# Patient Record
Sex: Female | Born: 1950
Health system: Southern US, Community
[De-identification: ages and names within clinical notes are randomized; demographics above are authoritative.]

## PROBLEM LIST (undated history)

## (undated) DIAGNOSIS — F1910 Other psychoactive substance abuse, uncomplicated: Secondary | ICD-10-CM

## (undated) DIAGNOSIS — D490 Neoplasm of unspecified behavior of digestive system: Secondary | ICD-10-CM

## (undated) DIAGNOSIS — J189 Pneumonia, unspecified organism: Secondary | ICD-10-CM

## (undated) DIAGNOSIS — Z765 Malingerer [conscious simulation]: Secondary | ICD-10-CM

## (undated) DIAGNOSIS — G8929 Other chronic pain: Secondary | ICD-10-CM

## (undated) DIAGNOSIS — D151 Benign neoplasm of heart: Secondary | ICD-10-CM

## (undated) DIAGNOSIS — I2510 Atherosclerotic heart disease of native coronary artery without angina pectoris: Secondary | ICD-10-CM

## (undated) DIAGNOSIS — M549 Dorsalgia, unspecified: Secondary | ICD-10-CM

## (undated) DIAGNOSIS — M1990 Unspecified osteoarthritis, unspecified site: Secondary | ICD-10-CM

## (undated) DIAGNOSIS — K3189 Other diseases of stomach and duodenum: Secondary | ICD-10-CM

## (undated) DIAGNOSIS — G43909 Migraine, unspecified, not intractable, without status migrainosus: Secondary | ICD-10-CM

## (undated) DIAGNOSIS — F419 Anxiety disorder, unspecified: Secondary | ICD-10-CM

## (undated) DIAGNOSIS — D649 Anemia, unspecified: Secondary | ICD-10-CM

## (undated) DIAGNOSIS — M109 Gout, unspecified: Secondary | ICD-10-CM

## (undated) DIAGNOSIS — K219 Gastro-esophageal reflux disease without esophagitis: Secondary | ICD-10-CM

## (undated) DIAGNOSIS — K589 Irritable bowel syndrome without diarrhea: Secondary | ICD-10-CM

## (undated) DIAGNOSIS — M25569 Pain in unspecified knee: Secondary | ICD-10-CM

## (undated) DIAGNOSIS — Z8719 Personal history of other diseases of the digestive system: Secondary | ICD-10-CM

## (undated) DIAGNOSIS — G629 Polyneuropathy, unspecified: Secondary | ICD-10-CM

## (undated) DIAGNOSIS — Z9889 Other specified postprocedural states: Secondary | ICD-10-CM

## (undated) HISTORY — DX: Other specified postprocedural states: Z98.890

## (undated) HISTORY — DX: Atherosclerotic heart disease of native coronary artery without angina pectoris: I25.10

## (undated) HISTORY — DX: Irritable bowel syndrome, unspecified: K58.9

## (undated) HISTORY — PX: CHOLECYSTECTOMY: SHX55

## (undated) HISTORY — PX: OTHER SURGICAL HISTORY: SHX169

## (undated) HISTORY — PX: FOOT SURGERY: SHX648

## (undated) HISTORY — PX: TONSILLECTOMY: SUR1361

## (undated) HISTORY — DX: Other chronic pain: G89.29

## (undated) HISTORY — DX: Other diseases of stomach and duodenum: K31.89

## (undated) HISTORY — PX: DILATION AND CURETTAGE OF UTERUS: SHX78

## (undated) HISTORY — DX: Anxiety disorder, unspecified: F41.9

## (undated) HISTORY — DX: Neoplasm of unspecified behavior of digestive system: D49.0

## (undated) HISTORY — PX: BACK SURGERY: SHX140

## (undated) HISTORY — DX: Personal history of other diseases of the digestive system: Z87.19

## (undated) HISTORY — DX: Gastro-esophageal reflux disease without esophagitis: K21.9

## (undated) HISTORY — PX: ABDOMINAL HYSTERECTOMY: SHX81

## (undated) HISTORY — DX: Migraine, unspecified, not intractable, without status migrainosus: G43.909

## (undated) HISTORY — PX: ABDOMINAL SURGERY: SHX537

## (undated) HISTORY — DX: Dorsalgia, unspecified: M54.9

## (undated) HISTORY — DX: Benign neoplasm of heart: D15.1

---

## 1988-12-22 HISTORY — PX: PARTIAL GASTRECTOMY: SHX2172

## 1990-12-22 DIAGNOSIS — D490 Neoplasm of unspecified behavior of digestive system: Secondary | ICD-10-CM

## 1990-12-22 HISTORY — DX: Neoplasm of unspecified behavior of digestive system: D49.0

## 1998-04-10 ENCOUNTER — Ambulatory Visit (HOSPITAL_COMMUNITY): Admission: RE | Admit: 1998-04-10 | Discharge: 1998-04-10 | Payer: Self-pay | Admitting: *Deleted

## 1998-05-04 ENCOUNTER — Inpatient Hospital Stay (HOSPITAL_COMMUNITY): Admission: AD | Admit: 1998-05-04 | Discharge: 1998-05-08 | Payer: Self-pay | Admitting: Internal Medicine

## 1998-05-22 ENCOUNTER — Encounter: Admission: RE | Admit: 1998-05-22 | Discharge: 1998-05-22 | Payer: Self-pay | Admitting: Internal Medicine

## 1998-06-13 ENCOUNTER — Ambulatory Visit (HOSPITAL_COMMUNITY): Admission: RE | Admit: 1998-06-13 | Discharge: 1998-06-13 | Payer: Self-pay | Admitting: *Deleted

## 1998-06-20 ENCOUNTER — Encounter: Admission: RE | Admit: 1998-06-20 | Discharge: 1998-06-20 | Payer: Self-pay | Admitting: Internal Medicine

## 1998-07-31 ENCOUNTER — Ambulatory Visit (HOSPITAL_COMMUNITY): Admission: RE | Admit: 1998-07-31 | Discharge: 1998-07-31 | Payer: Self-pay | Admitting: *Deleted

## 1998-08-07 ENCOUNTER — Ambulatory Visit (HOSPITAL_COMMUNITY): Admission: RE | Admit: 1998-08-07 | Discharge: 1998-08-07 | Payer: Self-pay | Admitting: *Deleted

## 1998-10-03 ENCOUNTER — Encounter: Payer: Self-pay | Admitting: *Deleted

## 1998-10-03 ENCOUNTER — Ambulatory Visit (HOSPITAL_COMMUNITY): Admission: RE | Admit: 1998-10-03 | Discharge: 1998-10-03 | Payer: Self-pay | Admitting: *Deleted

## 1999-12-23 HISTORY — PX: COLONOSCOPY: SHX174

## 2000-03-30 ENCOUNTER — Encounter: Admission: RE | Admit: 2000-03-30 | Discharge: 2000-03-30 | Payer: Self-pay | Admitting: Internal Medicine

## 2000-04-08 ENCOUNTER — Emergency Department (HOSPITAL_COMMUNITY): Admission: EM | Admit: 2000-04-08 | Discharge: 2000-04-08 | Payer: Self-pay | Admitting: Emergency Medicine

## 2000-04-08 ENCOUNTER — Encounter: Payer: Self-pay | Admitting: Emergency Medicine

## 2000-04-20 ENCOUNTER — Encounter: Admission: RE | Admit: 2000-04-20 | Discharge: 2000-04-20 | Payer: Self-pay | Admitting: Internal Medicine

## 2000-06-01 ENCOUNTER — Encounter: Admission: RE | Admit: 2000-06-01 | Discharge: 2000-06-01 | Payer: Self-pay | Admitting: Internal Medicine

## 2000-06-26 ENCOUNTER — Encounter: Admission: RE | Admit: 2000-06-26 | Discharge: 2000-06-26 | Payer: Self-pay | Admitting: Internal Medicine

## 2000-07-15 ENCOUNTER — Encounter: Admission: RE | Admit: 2000-07-15 | Discharge: 2000-07-15 | Payer: Self-pay | Admitting: Internal Medicine

## 2000-09-09 ENCOUNTER — Encounter: Admission: RE | Admit: 2000-09-09 | Discharge: 2000-09-09 | Payer: Self-pay | Admitting: Internal Medicine

## 2000-10-07 ENCOUNTER — Encounter: Payer: Self-pay | Admitting: Emergency Medicine

## 2000-10-07 ENCOUNTER — Emergency Department (HOSPITAL_COMMUNITY): Admission: EM | Admit: 2000-10-07 | Discharge: 2000-10-07 | Payer: Self-pay | Admitting: Emergency Medicine

## 2000-10-09 ENCOUNTER — Encounter: Admission: RE | Admit: 2000-10-09 | Discharge: 2000-10-09 | Payer: Self-pay | Admitting: Internal Medicine

## 2000-10-15 ENCOUNTER — Encounter: Admission: RE | Admit: 2000-10-15 | Discharge: 2000-10-15 | Payer: Self-pay

## 2000-11-11 ENCOUNTER — Encounter: Admission: RE | Admit: 2000-11-11 | Discharge: 2000-11-11 | Payer: Self-pay | Admitting: Internal Medicine

## 2000-12-04 ENCOUNTER — Encounter: Admission: RE | Admit: 2000-12-04 | Discharge: 2000-12-04 | Payer: Self-pay | Admitting: Internal Medicine

## 2001-01-05 ENCOUNTER — Encounter: Admission: RE | Admit: 2001-01-05 | Discharge: 2001-01-05 | Payer: Self-pay | Admitting: Internal Medicine

## 2001-01-20 ENCOUNTER — Encounter: Admission: RE | Admit: 2001-01-20 | Discharge: 2001-01-20 | Payer: Self-pay | Admitting: Internal Medicine

## 2001-04-01 ENCOUNTER — Emergency Department (HOSPITAL_COMMUNITY): Admission: EM | Admit: 2001-04-01 | Discharge: 2001-04-01 | Payer: Self-pay | Admitting: *Deleted

## 2001-04-28 ENCOUNTER — Encounter: Admission: RE | Admit: 2001-04-28 | Discharge: 2001-04-28 | Payer: Self-pay | Admitting: Internal Medicine

## 2001-06-13 ENCOUNTER — Emergency Department (HOSPITAL_COMMUNITY): Admission: EM | Admit: 2001-06-13 | Discharge: 2001-06-14 | Payer: Self-pay | Admitting: Emergency Medicine

## 2001-07-09 ENCOUNTER — Encounter: Admission: RE | Admit: 2001-07-09 | Discharge: 2001-07-09 | Payer: Self-pay | Admitting: Internal Medicine

## 2001-08-06 ENCOUNTER — Encounter: Admission: RE | Admit: 2001-08-06 | Discharge: 2001-08-06 | Payer: Self-pay | Admitting: Internal Medicine

## 2001-08-31 ENCOUNTER — Encounter: Payer: Self-pay | Admitting: Emergency Medicine

## 2001-08-31 ENCOUNTER — Emergency Department (HOSPITAL_COMMUNITY): Admission: EM | Admit: 2001-08-31 | Discharge: 2001-08-31 | Payer: Self-pay | Admitting: Emergency Medicine

## 2001-10-27 ENCOUNTER — Encounter: Admission: RE | Admit: 2001-10-27 | Discharge: 2001-10-27 | Payer: Self-pay | Admitting: Internal Medicine

## 2001-11-13 ENCOUNTER — Emergency Department (HOSPITAL_COMMUNITY): Admission: EM | Admit: 2001-11-13 | Discharge: 2001-11-13 | Payer: Self-pay | Admitting: Emergency Medicine

## 2001-12-03 ENCOUNTER — Encounter: Admission: RE | Admit: 2001-12-03 | Discharge: 2001-12-03 | Payer: Self-pay | Admitting: Internal Medicine

## 2001-12-20 ENCOUNTER — Ambulatory Visit (HOSPITAL_COMMUNITY): Admission: RE | Admit: 2001-12-20 | Discharge: 2001-12-20 | Payer: Self-pay

## 2001-12-20 ENCOUNTER — Encounter: Admission: RE | Admit: 2001-12-20 | Discharge: 2001-12-20 | Payer: Self-pay

## 2001-12-20 ENCOUNTER — Encounter: Payer: Self-pay | Admitting: Internal Medicine

## 2001-12-21 ENCOUNTER — Emergency Department (HOSPITAL_COMMUNITY): Admission: EM | Admit: 2001-12-21 | Discharge: 2001-12-21 | Payer: Self-pay | Admitting: *Deleted

## 2002-01-06 ENCOUNTER — Encounter: Admission: RE | Admit: 2002-01-06 | Discharge: 2002-01-06 | Payer: Self-pay

## 2002-03-05 ENCOUNTER — Emergency Department (HOSPITAL_COMMUNITY): Admission: EM | Admit: 2002-03-05 | Discharge: 2002-03-05 | Payer: Self-pay | Admitting: Emergency Medicine

## 2002-03-05 ENCOUNTER — Encounter: Payer: Self-pay | Admitting: Emergency Medicine

## 2002-03-08 ENCOUNTER — Encounter: Admission: RE | Admit: 2002-03-08 | Discharge: 2002-03-08 | Payer: Self-pay | Admitting: Internal Medicine

## 2002-03-13 ENCOUNTER — Emergency Department (HOSPITAL_COMMUNITY): Admission: EM | Admit: 2002-03-13 | Discharge: 2002-03-13 | Payer: Self-pay | Admitting: Internal Medicine

## 2002-03-30 ENCOUNTER — Encounter: Admission: RE | Admit: 2002-03-30 | Discharge: 2002-03-30 | Payer: Self-pay

## 2002-05-13 ENCOUNTER — Emergency Department (HOSPITAL_COMMUNITY): Admission: EM | Admit: 2002-05-13 | Discharge: 2002-05-13 | Payer: Self-pay | Admitting: *Deleted

## 2002-06-13 ENCOUNTER — Emergency Department (HOSPITAL_COMMUNITY): Admission: EM | Admit: 2002-06-13 | Discharge: 2002-06-14 | Payer: Self-pay | Admitting: *Deleted

## 2002-06-14 ENCOUNTER — Encounter: Admission: RE | Admit: 2002-06-14 | Discharge: 2002-06-14 | Payer: Self-pay | Admitting: Internal Medicine

## 2002-06-25 ENCOUNTER — Emergency Department (HOSPITAL_COMMUNITY): Admission: EM | Admit: 2002-06-25 | Discharge: 2002-06-25 | Payer: Self-pay | Admitting: Emergency Medicine

## 2002-06-29 ENCOUNTER — Encounter: Admission: RE | Admit: 2002-06-29 | Discharge: 2002-06-29 | Payer: Self-pay | Admitting: Internal Medicine

## 2002-08-09 ENCOUNTER — Emergency Department (HOSPITAL_COMMUNITY): Admission: EM | Admit: 2002-08-09 | Discharge: 2002-08-10 | Payer: Self-pay | Admitting: *Deleted

## 2002-08-10 ENCOUNTER — Encounter: Payer: Self-pay | Admitting: *Deleted

## 2002-08-17 ENCOUNTER — Encounter: Admission: RE | Admit: 2002-08-17 | Discharge: 2002-08-17 | Payer: Self-pay | Admitting: Internal Medicine

## 2002-08-25 ENCOUNTER — Encounter: Payer: Self-pay | Admitting: Emergency Medicine

## 2002-08-25 ENCOUNTER — Emergency Department (HOSPITAL_COMMUNITY): Admission: EM | Admit: 2002-08-25 | Discharge: 2002-08-25 | Payer: Self-pay | Admitting: Emergency Medicine

## 2002-09-13 ENCOUNTER — Emergency Department (HOSPITAL_COMMUNITY): Admission: EM | Admit: 2002-09-13 | Discharge: 2002-09-13 | Payer: Self-pay | Admitting: *Deleted

## 2002-09-14 ENCOUNTER — Encounter: Admission: RE | Admit: 2002-09-14 | Discharge: 2002-09-14 | Payer: Self-pay | Admitting: Internal Medicine

## 2002-09-29 ENCOUNTER — Encounter: Admission: RE | Admit: 2002-09-29 | Discharge: 2002-09-29 | Payer: Self-pay | Admitting: Obstetrics and Gynecology

## 2002-10-05 ENCOUNTER — Encounter: Admission: RE | Admit: 2002-10-05 | Discharge: 2002-10-05 | Payer: Self-pay | Admitting: Internal Medicine

## 2002-10-11 ENCOUNTER — Encounter: Admission: RE | Admit: 2002-10-11 | Discharge: 2002-10-11 | Payer: Self-pay | Admitting: Internal Medicine

## 2002-10-17 ENCOUNTER — Emergency Department (HOSPITAL_COMMUNITY): Admission: EM | Admit: 2002-10-17 | Discharge: 2002-10-17 | Payer: Self-pay | Admitting: Emergency Medicine

## 2002-10-31 ENCOUNTER — Encounter: Admission: RE | Admit: 2002-10-31 | Discharge: 2002-10-31 | Payer: Self-pay | Admitting: Internal Medicine

## 2002-11-07 ENCOUNTER — Emergency Department (HOSPITAL_COMMUNITY): Admission: EM | Admit: 2002-11-07 | Discharge: 2002-11-07 | Payer: Self-pay | Admitting: Emergency Medicine

## 2002-11-24 ENCOUNTER — Encounter: Admission: RE | Admit: 2002-11-24 | Discharge: 2002-11-24 | Payer: Self-pay | Admitting: Internal Medicine

## 2002-12-08 ENCOUNTER — Emergency Department (HOSPITAL_COMMUNITY): Admission: EM | Admit: 2002-12-08 | Discharge: 2002-12-08 | Payer: Self-pay | Admitting: Internal Medicine

## 2002-12-08 ENCOUNTER — Encounter: Admission: RE | Admit: 2002-12-08 | Discharge: 2002-12-08 | Payer: Self-pay | Admitting: Internal Medicine

## 2003-01-14 ENCOUNTER — Emergency Department (HOSPITAL_COMMUNITY): Admission: EM | Admit: 2003-01-14 | Discharge: 2003-01-14 | Payer: Self-pay | Admitting: *Deleted

## 2003-01-14 ENCOUNTER — Encounter: Payer: Self-pay | Admitting: *Deleted

## 2003-01-19 ENCOUNTER — Ambulatory Visit (HOSPITAL_COMMUNITY): Admission: RE | Admit: 2003-01-19 | Discharge: 2003-01-19 | Payer: Self-pay | Admitting: Internal Medicine

## 2003-01-19 ENCOUNTER — Encounter: Admission: RE | Admit: 2003-01-19 | Discharge: 2003-01-19 | Payer: Self-pay | Admitting: Internal Medicine

## 2003-01-19 ENCOUNTER — Encounter: Payer: Self-pay | Admitting: Internal Medicine

## 2003-03-09 ENCOUNTER — Encounter: Admission: RE | Admit: 2003-03-09 | Discharge: 2003-03-09 | Payer: Self-pay | Admitting: Internal Medicine

## 2003-03-15 ENCOUNTER — Emergency Department (HOSPITAL_COMMUNITY): Admission: EM | Admit: 2003-03-15 | Discharge: 2003-03-15 | Payer: Self-pay | Admitting: *Deleted

## 2003-05-05 ENCOUNTER — Encounter: Payer: Self-pay | Admitting: *Deleted

## 2003-05-05 ENCOUNTER — Emergency Department (HOSPITAL_COMMUNITY): Admission: EM | Admit: 2003-05-05 | Discharge: 2003-05-05 | Payer: Self-pay | Admitting: *Deleted

## 2003-07-17 ENCOUNTER — Emergency Department (HOSPITAL_COMMUNITY): Admission: EM | Admit: 2003-07-17 | Discharge: 2003-07-17 | Payer: Self-pay | Admitting: *Deleted

## 2003-08-06 ENCOUNTER — Emergency Department (HOSPITAL_COMMUNITY): Admission: EM | Admit: 2003-08-06 | Discharge: 2003-08-06 | Payer: Self-pay | Admitting: *Deleted

## 2003-08-06 ENCOUNTER — Encounter: Payer: Self-pay | Admitting: *Deleted

## 2003-08-17 ENCOUNTER — Emergency Department (HOSPITAL_COMMUNITY): Admission: EM | Admit: 2003-08-17 | Discharge: 2003-08-18 | Payer: Self-pay | Admitting: *Deleted

## 2003-08-22 ENCOUNTER — Ambulatory Visit (HOSPITAL_COMMUNITY): Admission: RE | Admit: 2003-08-22 | Discharge: 2003-08-22 | Payer: Self-pay | Admitting: Oncology

## 2003-08-22 ENCOUNTER — Encounter: Payer: Self-pay | Admitting: General Surgery

## 2003-08-22 ENCOUNTER — Encounter (HOSPITAL_COMMUNITY): Payer: Self-pay | Admitting: Oncology

## 2003-08-23 ENCOUNTER — Ambulatory Visit (HOSPITAL_COMMUNITY): Admission: RE | Admit: 2003-08-23 | Discharge: 2003-08-23 | Payer: Self-pay | Admitting: General Surgery

## 2003-08-24 ENCOUNTER — Encounter: Payer: Self-pay | Admitting: *Deleted

## 2003-08-24 ENCOUNTER — Emergency Department (HOSPITAL_COMMUNITY): Admission: EM | Admit: 2003-08-24 | Discharge: 2003-08-25 | Payer: Self-pay | Admitting: *Deleted

## 2004-01-29 ENCOUNTER — Emergency Department (HOSPITAL_COMMUNITY): Admission: EM | Admit: 2004-01-29 | Discharge: 2004-01-29 | Payer: Self-pay | Admitting: Emergency Medicine

## 2004-02-08 ENCOUNTER — Ambulatory Visit (HOSPITAL_COMMUNITY): Admission: RE | Admit: 2004-02-08 | Discharge: 2004-02-08 | Payer: Self-pay | Admitting: General Surgery

## 2004-05-03 ENCOUNTER — Emergency Department (HOSPITAL_COMMUNITY): Admission: EM | Admit: 2004-05-03 | Discharge: 2004-05-03 | Payer: Self-pay | Admitting: Emergency Medicine

## 2004-06-17 ENCOUNTER — Emergency Department (HOSPITAL_COMMUNITY): Admission: EM | Admit: 2004-06-17 | Discharge: 2004-06-17 | Payer: Self-pay | Admitting: Emergency Medicine

## 2004-08-03 ENCOUNTER — Emergency Department (HOSPITAL_COMMUNITY): Admission: EM | Admit: 2004-08-03 | Discharge: 2004-08-03 | Payer: Self-pay | Admitting: Emergency Medicine

## 2004-08-20 ENCOUNTER — Emergency Department (HOSPITAL_COMMUNITY): Admission: EM | Admit: 2004-08-20 | Discharge: 2004-08-21 | Payer: Self-pay | Admitting: Emergency Medicine

## 2004-09-20 ENCOUNTER — Emergency Department (HOSPITAL_COMMUNITY): Admission: EM | Admit: 2004-09-20 | Discharge: 2004-09-20 | Payer: Self-pay | Admitting: Emergency Medicine

## 2004-09-22 ENCOUNTER — Emergency Department (HOSPITAL_COMMUNITY): Admission: EM | Admit: 2004-09-22 | Discharge: 2004-09-22 | Payer: Self-pay | Admitting: Emergency Medicine

## 2004-10-17 ENCOUNTER — Emergency Department (HOSPITAL_COMMUNITY): Admission: EM | Admit: 2004-10-17 | Discharge: 2004-10-17 | Payer: Self-pay | Admitting: Emergency Medicine

## 2004-12-10 ENCOUNTER — Emergency Department (HOSPITAL_COMMUNITY): Admission: EM | Admit: 2004-12-10 | Discharge: 2004-12-11 | Payer: Self-pay | Admitting: *Deleted

## 2006-07-07 ENCOUNTER — Emergency Department (HOSPITAL_COMMUNITY): Admission: EM | Admit: 2006-07-07 | Discharge: 2006-07-07 | Payer: Self-pay | Admitting: Emergency Medicine

## 2007-07-22 ENCOUNTER — Emergency Department (HOSPITAL_COMMUNITY): Admission: EM | Admit: 2007-07-22 | Discharge: 2007-07-22 | Payer: Self-pay | Admitting: Emergency Medicine

## 2007-12-18 ENCOUNTER — Emergency Department (HOSPITAL_COMMUNITY): Admission: EM | Admit: 2007-12-18 | Discharge: 2007-12-19 | Payer: Self-pay | Admitting: Emergency Medicine

## 2007-12-23 DIAGNOSIS — K3189 Other diseases of stomach and duodenum: Secondary | ICD-10-CM

## 2007-12-23 HISTORY — DX: Other diseases of stomach and duodenum: K31.89

## 2008-08-30 ENCOUNTER — Emergency Department (HOSPITAL_COMMUNITY): Admission: EM | Admit: 2008-08-30 | Discharge: 2008-08-30 | Payer: Self-pay | Admitting: Emergency Medicine

## 2008-08-30 ENCOUNTER — Encounter: Payer: Self-pay | Admitting: Orthopedic Surgery

## 2008-09-25 ENCOUNTER — Emergency Department (HOSPITAL_COMMUNITY): Admission: EM | Admit: 2008-09-25 | Discharge: 2008-09-25 | Payer: Self-pay | Admitting: Emergency Medicine

## 2008-10-16 ENCOUNTER — Telehealth: Payer: Self-pay | Admitting: Orthopedic Surgery

## 2008-10-16 ENCOUNTER — Ambulatory Visit: Payer: Self-pay | Admitting: Orthopedic Surgery

## 2008-10-16 DIAGNOSIS — M25569 Pain in unspecified knee: Secondary | ICD-10-CM

## 2008-10-16 DIAGNOSIS — M23302 Other meniscus derangements, unspecified lateral meniscus, unspecified knee: Secondary | ICD-10-CM | POA: Insufficient documentation

## 2008-10-17 ENCOUNTER — Telehealth: Payer: Self-pay | Admitting: Orthopedic Surgery

## 2008-10-19 ENCOUNTER — Ambulatory Visit (HOSPITAL_COMMUNITY): Admission: RE | Admit: 2008-10-19 | Discharge: 2008-10-19 | Payer: Self-pay | Admitting: Orthopedic Surgery

## 2008-10-25 ENCOUNTER — Ambulatory Visit: Payer: Self-pay | Admitting: Orthopedic Surgery

## 2008-10-25 DIAGNOSIS — M1710 Unilateral primary osteoarthritis, unspecified knee: Secondary | ICD-10-CM | POA: Insufficient documentation

## 2008-11-06 ENCOUNTER — Emergency Department (HOSPITAL_COMMUNITY): Admission: EM | Admit: 2008-11-06 | Discharge: 2008-11-06 | Payer: Self-pay | Admitting: Emergency Medicine

## 2008-11-09 ENCOUNTER — Ambulatory Visit: Payer: Self-pay | Admitting: Orthopedic Surgery

## 2008-11-09 DIAGNOSIS — M19049 Primary osteoarthritis, unspecified hand: Secondary | ICD-10-CM | POA: Insufficient documentation

## 2008-11-21 HISTORY — PX: ESOPHAGOGASTRODUODENOSCOPY: SHX1529

## 2008-11-29 ENCOUNTER — Telehealth: Payer: Self-pay | Admitting: Orthopedic Surgery

## 2008-12-05 ENCOUNTER — Emergency Department (HOSPITAL_COMMUNITY): Admission: EM | Admit: 2008-12-05 | Discharge: 2008-12-05 | Payer: Self-pay | Admitting: Emergency Medicine

## 2008-12-08 ENCOUNTER — Inpatient Hospital Stay (HOSPITAL_COMMUNITY): Admission: EM | Admit: 2008-12-08 | Discharge: 2008-12-11 | Payer: Self-pay | Admitting: Emergency Medicine

## 2008-12-11 ENCOUNTER — Ambulatory Visit: Payer: Self-pay | Admitting: Gastroenterology

## 2008-12-11 ENCOUNTER — Encounter (INDEPENDENT_AMBULATORY_CARE_PROVIDER_SITE_OTHER): Payer: Self-pay | Admitting: Internal Medicine

## 2008-12-22 HISTORY — PX: GALLBLADDER SURGERY: SHX652

## 2008-12-27 ENCOUNTER — Ambulatory Visit: Payer: Self-pay | Admitting: Orthopedic Surgery

## 2008-12-29 ENCOUNTER — Encounter: Payer: Self-pay | Admitting: Gastroenterology

## 2008-12-29 LAB — CONVERTED CEMR LAB
Albumin: 4.4 g/dL (ref 3.5–5.2)
Alkaline Phosphatase: 71 units/L (ref 39–117)
BUN: 12 mg/dL (ref 6–23)
Calcium: 9.6 mg/dL (ref 8.4–10.5)
Chloride: 104 meq/L (ref 96–112)
Glucose, Bld: 95 mg/dL (ref 70–99)
Potassium: 3.7 meq/L (ref 3.5–5.3)
Sodium: 140 meq/L (ref 135–145)
Total Protein: 6.7 g/dL (ref 6.0–8.3)

## 2009-01-15 ENCOUNTER — Ambulatory Visit: Payer: Self-pay | Admitting: Gastroenterology

## 2009-01-29 ENCOUNTER — Encounter (HOSPITAL_COMMUNITY): Admission: RE | Admit: 2009-01-29 | Discharge: 2009-02-28 | Payer: Self-pay | Admitting: Gastroenterology

## 2009-01-31 ENCOUNTER — Emergency Department (HOSPITAL_COMMUNITY): Admission: EM | Admit: 2009-01-31 | Discharge: 2009-01-31 | Payer: Self-pay | Admitting: Emergency Medicine

## 2009-02-14 ENCOUNTER — Observation Stay (HOSPITAL_COMMUNITY): Admission: RE | Admit: 2009-02-14 | Discharge: 2009-02-15 | Payer: Self-pay | Admitting: General Surgery

## 2009-02-14 ENCOUNTER — Encounter (INDEPENDENT_AMBULATORY_CARE_PROVIDER_SITE_OTHER): Payer: Self-pay | Admitting: General Surgery

## 2009-02-19 ENCOUNTER — Encounter: Payer: Self-pay | Admitting: Gastroenterology

## 2009-03-14 ENCOUNTER — Emergency Department (HOSPITAL_COMMUNITY): Admission: EM | Admit: 2009-03-14 | Discharge: 2009-03-14 | Payer: Self-pay | Admitting: Emergency Medicine

## 2009-04-01 ENCOUNTER — Emergency Department (HOSPITAL_COMMUNITY): Admission: EM | Admit: 2009-04-01 | Discharge: 2009-04-01 | Payer: Self-pay | Admitting: Emergency Medicine

## 2009-04-10 ENCOUNTER — Encounter: Payer: Self-pay | Admitting: Gastroenterology

## 2009-05-26 ENCOUNTER — Emergency Department (HOSPITAL_COMMUNITY): Admission: EM | Admit: 2009-05-26 | Discharge: 2009-05-26 | Payer: Self-pay | Admitting: Emergency Medicine

## 2009-07-12 DIAGNOSIS — K2990 Gastroduodenitis, unspecified, without bleeding: Secondary | ICD-10-CM

## 2009-07-12 DIAGNOSIS — R1011 Right upper quadrant pain: Secondary | ICD-10-CM

## 2009-07-12 DIAGNOSIS — R1013 Epigastric pain: Secondary | ICD-10-CM

## 2009-07-12 DIAGNOSIS — K2970 Gastritis, unspecified, without bleeding: Secondary | ICD-10-CM | POA: Insufficient documentation

## 2009-07-12 DIAGNOSIS — K589 Irritable bowel syndrome without diarrhea: Secondary | ICD-10-CM | POA: Insufficient documentation

## 2009-07-12 DIAGNOSIS — Z8719 Personal history of other diseases of the digestive system: Secondary | ICD-10-CM | POA: Insufficient documentation

## 2009-07-17 ENCOUNTER — Ambulatory Visit: Payer: Self-pay | Admitting: Gastroenterology

## 2009-07-17 DIAGNOSIS — R1310 Dysphagia, unspecified: Secondary | ICD-10-CM | POA: Insufficient documentation

## 2009-07-17 DIAGNOSIS — R1084 Generalized abdominal pain: Secondary | ICD-10-CM

## 2009-07-20 ENCOUNTER — Ambulatory Visit (HOSPITAL_COMMUNITY): Admission: RE | Admit: 2009-07-20 | Discharge: 2009-07-20 | Payer: Self-pay | Admitting: Gastroenterology

## 2009-07-24 ENCOUNTER — Emergency Department (HOSPITAL_COMMUNITY): Admission: EM | Admit: 2009-07-24 | Discharge: 2009-07-24 | Payer: Self-pay | Admitting: Emergency Medicine

## 2009-07-27 ENCOUNTER — Emergency Department (HOSPITAL_COMMUNITY): Admission: EM | Admit: 2009-07-27 | Discharge: 2009-07-27 | Payer: Self-pay | Admitting: Emergency Medicine

## 2009-07-31 ENCOUNTER — Emergency Department (HOSPITAL_COMMUNITY): Admission: EM | Admit: 2009-07-31 | Discharge: 2009-07-31 | Payer: Self-pay | Admitting: Emergency Medicine

## 2009-09-15 ENCOUNTER — Emergency Department (HOSPITAL_COMMUNITY): Admission: EM | Admit: 2009-09-15 | Discharge: 2009-09-15 | Payer: Self-pay | Admitting: Emergency Medicine

## 2009-09-19 ENCOUNTER — Encounter (INDEPENDENT_AMBULATORY_CARE_PROVIDER_SITE_OTHER): Payer: Self-pay | Admitting: *Deleted

## 2009-10-07 ENCOUNTER — Emergency Department (HOSPITAL_COMMUNITY): Admission: EM | Admit: 2009-10-07 | Discharge: 2009-10-07 | Payer: Self-pay | Admitting: Emergency Medicine

## 2009-10-15 ENCOUNTER — Emergency Department (HOSPITAL_COMMUNITY): Admission: EM | Admit: 2009-10-15 | Discharge: 2009-10-15 | Payer: Self-pay | Admitting: Emergency Medicine

## 2009-10-26 ENCOUNTER — Telehealth (INDEPENDENT_AMBULATORY_CARE_PROVIDER_SITE_OTHER): Payer: Self-pay

## 2009-11-09 ENCOUNTER — Encounter: Payer: Self-pay | Admitting: Gastroenterology

## 2010-03-04 ENCOUNTER — Emergency Department (HOSPITAL_COMMUNITY): Admission: EM | Admit: 2010-03-04 | Discharge: 2010-03-04 | Payer: Self-pay | Admitting: Emergency Medicine

## 2010-05-15 ENCOUNTER — Emergency Department (HOSPITAL_COMMUNITY): Admission: EM | Admit: 2010-05-15 | Discharge: 2010-05-15 | Payer: Self-pay | Admitting: Emergency Medicine

## 2010-06-28 ENCOUNTER — Emergency Department (HOSPITAL_COMMUNITY): Admission: EM | Admit: 2010-06-28 | Discharge: 2010-06-28 | Payer: Self-pay | Admitting: Emergency Medicine

## 2010-06-30 ENCOUNTER — Emergency Department (HOSPITAL_COMMUNITY): Admission: EM | Admit: 2010-06-30 | Discharge: 2010-06-30 | Payer: Self-pay | Admitting: Emergency Medicine

## 2010-07-20 ENCOUNTER — Emergency Department (HOSPITAL_COMMUNITY): Admission: EM | Admit: 2010-07-20 | Discharge: 2010-07-20 | Payer: Self-pay | Admitting: Emergency Medicine

## 2010-07-28 ENCOUNTER — Encounter: Payer: Self-pay | Admitting: Orthopedic Surgery

## 2010-07-28 ENCOUNTER — Emergency Department (HOSPITAL_COMMUNITY): Admission: EM | Admit: 2010-07-28 | Discharge: 2010-07-28 | Payer: Self-pay | Admitting: Emergency Medicine

## 2010-07-31 ENCOUNTER — Ambulatory Visit: Payer: Self-pay | Admitting: Orthopedic Surgery

## 2010-07-31 DIAGNOSIS — S82839A Other fracture of upper and lower end of unspecified fibula, initial encounter for closed fracture: Secondary | ICD-10-CM

## 2010-08-19 ENCOUNTER — Telehealth: Payer: Self-pay | Admitting: Orthopedic Surgery

## 2010-09-11 ENCOUNTER — Ambulatory Visit: Payer: Self-pay | Admitting: Orthopedic Surgery

## 2011-01-11 ENCOUNTER — Encounter: Payer: Self-pay | Admitting: General Surgery

## 2011-01-23 NOTE — Assessment & Plan Note (Signed)
Summary: 6 WK RE-CK/XRAYS/FX LT FIB/[place] MEDICAID/CAF   Visit Type:  Follow-up Referring Betsey Sossamon:  ap er Primary Jullie Arps:  Dr. Olena Leatherwood  CC:  left tib fib fracture.  History of Present Illness: I saw Jacqueline [name] in the office today for a 6 week  followup visit.  She is a 60 years old woman with the complaint of: LEFT proximal fibular fracture.  The patient says she had a second injury when she ran into someone in [employer] she reapplied the knee immobilizer.  X-rays initially showed a small nondisplaced proximal fibular neck fracture  Current medications Norco 5 mg at 225, one q.4 as needed.  Patient states that anti-inflammatories "tear my stomach apart"  She complains of a wobbly feeling in her LEFT knee.  Repeat films today [facility] and lateral LEFT knee show that the fibular fracture healed.  Exam and no instability in the knee and the ACL PCL isolated lateral collateral ligament test mediocollateral ligament test.  Her sign negative joint lines laterally nontender medially mild tenderness from previous known osteoarthritis  She gave me a story about her boyfriend and different family problems and that she had to move and she needs some more pain medicine to get her to the end of the month.  I suspect that this is what all of the new knee pain is all about.  She also wanted to know she could get injections in her knee she had been previously for the arthritis of the medial compartment which is okay  But for now she can take Norco for the next 2 weeks then it stopped no further refills.  She can wear a knee sleeve for 6 weeks no further treatment needed for the proximal fibular fracture     Allergies: 1)  ! Sulfa 2)  ! Imitrex 3)  ! Toradol 4)  ! Ultram 5)  ! Asa   Impression & Recommendations:  Problem # 1:  CLOSED FRACTURE OF UPPER END OF FIBULA (ICD-823.01) Assessment Improved  Orders: Est. Patient Level III (28413) Knee x-ray,  3 views (24401)  Patient Instructions: 1)   Recommend brace for your knee to wear for 6 weeks  2)  Knee sleeve  Prescriptions: NORCO 5-325 MG TABS (HYDROCODONE-ACETAMINOPHEN) 1 by mouth q 4 as needed  #84 x 0   Entered and Authorized by:   Fuller Canada MD   Signed by:   Fuller Canada MD on 09/11/2010   Method used:   Print then Give to Patient   RxID:   [id number]

## 2011-01-23 NOTE — Letter (Signed)
Summary: History form  History form   Imported By: Jacklynn Ganong 08/06/2010 11:53:33  _____________________________________________________________________  External Attachment:    Type:   Image     Comment:   External Document

## 2011-01-23 NOTE — Medication Information (Signed)
Summary: Tax adviser   Imported By: Cammie Sickle 09/20/2010 21:51:03  _____________________________________________________________________  External Attachment:    Type:   Image     Comment:   External Document

## 2011-01-23 NOTE — Assessment & Plan Note (Signed)
Summary: NEW PROB/AP ER FOLUP/FX LT FIB/XR AP 07/28/10   Vital Signs:  Patient profile:   60 year old female Height:      63 inches Weight:      115 pounds Pulse rate:   64 / minute Resp:     16 per minute  Vitals Entered By: Fuller Canada MD (July 31, 2010 2:21 PM)  Visit Type:  new problem Referring Provider:  ap er Primary Provider:  Dr. Olena Leatherwood  CC:  left knee pain.  History of Present Illness: I saw Jacqueline [name] in the office today for a followup visit.  She is a 60 years old woman with the complaint of:  left knee pain.  Fell from ladder 07/26/10.  Xrays left knee, tib fib, L spine 07/28/10 [facility].  Was given Norco 5 and Flexeril 10 for pain from er.   she has fairly severe lateral pain after falling off a ladder. She does have a proximal fibular fracture, nondisplaced. She has some stiffness and pain laterally, and radiation of pain down into the ankle.    Allergies: 1)  ! Sulfa 2)  ! Imitrex 3)  ! Toradol 4)  ! Ultram 5)  ! Jonne Ply  Past History:  Past Medical History: migraines seasonal allergies reflux anxiety back pain  Past Surgical History: back surgery/ray cage fusion [facility], [employer], retired now. toe graft c section partial gastrectomy tonsils removed hysterectomy DNC gallbladder  Family History: FH of Cancer:  Family History Coronary Heart Disease female < 49 Family History of Arthritis Hx, family, chronic respiratory condition  Social History: Patient is divorced disabled: back problems Smoking 2 cigs a day no alcohol lots of caffeine per day 10th grade ed.  Review of Systems Constitutional:  Complains of weight loss and fatigue; denies weight gain, fever, and chills. Cardiovascular:  Complains of chest pain and fainting; denies palpitations and murmurs. Respiratory:  Complains of short of breath, couch, and snoring; denies wheezing, tightness, pain on inspiration, and snoring . Gastrointestinal:  Complains of heartburn, nausea,  vomiting, diarrhea, constipation, and blood in your stools. Genitourinary:  Complains of urgency and bleeding in urine; denies frequency, difficulty urinating, painful urination, and flank pain. Neurologic:  Complains of numbness, tingling, and dizziness; denies unsteady gait, tremors, and seizure. Musculoskeletal:  Complains of joint pain, swelling, and stiffness; denies instability, redness, heat, and muscle pain. Endocrine:  Complains of excessive thirst and exessive urination; denies heat or cold intolerance. Psychiatric:  Complains of nervousness and anxiety; denies depression and hallucinations. Skin:  Complains of changes in the skin and itching; denies poor healing, rash, and redness. HEENT:  Denies blurred or double vision, eye pain, redness, and watering. Immunology:  Complains of seasonal allergies; denies sinus problems and allergic to bee stings. Hemoatologic:  Complains of easy bleeding and brusing.  Physical Exam  Additional Exam:  GEN: well developed, well nourished, normal grooming and hygiene, no deformity and small body habitus.   CDV: pulses are normal, no edema, no erythema. no tenderness  Lymph: normal lymph nodes   Skin: no rashes, skin lesions or open sores   NEURO: normal coordination, reflexes, sensation.   Psyche: awake, alert and oriented. Mood normal   Gait: she uses crutches, nonweightbearing.  She has swelling and tenderness over the lateral fibula head. Range of motion 4. Motor exam. Muscle tone normal. Collaterals are stable. Pushes are stable.      Impression & Recommendations:  Problem # 1:  CLOSED FRACTURE OF UPPER END OF FIBULA (ICD-823.01) the x-rays  from the hospital, show a nondisplaced proximal fibula fracture. proximal non displaced fibula fracture   Orders: New Patient Level III (57322)  Medications Added to Medication List This Visit: 1)  Norco 5-325 Mg Tabs (Hydrocodone-acetaminophen) .Marland Kitchen.. 1 by mouth q 4 as needed  Patient  Instructions: 1)  wear brace x 2 weeks  2)  use crutches for 2 weeks  3)  f/u in 6 weeks for xrays of the knee  Prescriptions: NORCO 5-325 MG TABS (HYDROCODONE-ACETAMINOPHEN) 1 by mouth q 4 as needed  #84 x 2   Entered and Authorized by:   Fuller Canada MD   Signed by:   Fuller Canada MD on 07/31/2010   Method used:   Print then Give to Patient   RxID:   [id number]

## 2011-01-23 NOTE — Progress Notes (Signed)
Summary: patient question about brace  Phone Note Call from Patient   Caller: Patient Summary of Call: Patient asking if there is a brace to support her knee, as states yesterday, she turned sideways and feels that her knee twisted a little.   Ph [phone]. Initial call taken by: Cammie Sickle,  August 19, 2010 12:33 PM  Follow-up for Phone Call        wear the brace that he rx her, if she needs other support, [employer] has knee sleeves that she can wear with her brace. Follow-up by: Ether Griffins,  August 19, 2010 1:28 PM  Additional Follow-up for Phone Call Additional follow up Details #1::        called patient and advised. Additional Follow-up by: Cammie Sickle,  August 19, 2010 2:42 PM

## 2011-02-03 ENCOUNTER — Emergency Department (HOSPITAL_COMMUNITY): Payer: Medicaid Other

## 2011-02-03 ENCOUNTER — Emergency Department (HOSPITAL_COMMUNITY)
Admission: EM | Admit: 2011-02-03 | Discharge: 2011-02-03 | Disposition: A | Discharge status: Home / Self Care | Payer: Medicaid Other | Attending: Emergency Medicine | Admitting: Emergency Medicine

## 2011-02-03 DIAGNOSIS — IMO0002 Reserved for concepts with insufficient information to code with codable children: Secondary | ICD-10-CM | POA: Insufficient documentation

## 2011-02-03 DIAGNOSIS — X500XXA Overexertion from strenuous movement or load, initial encounter: Secondary | ICD-10-CM | POA: Insufficient documentation

## 2011-02-03 DIAGNOSIS — Y92009 Unspecified place in unspecified non-institutional (private) residence as the place of occurrence of the external cause: Secondary | ICD-10-CM | POA: Insufficient documentation

## 2011-02-03 DIAGNOSIS — Y93E1 Activity, personal bathing and showering: Secondary | ICD-10-CM | POA: Insufficient documentation

## 2011-02-03 DIAGNOSIS — S8000XA Contusion of unspecified knee, initial encounter: Secondary | ICD-10-CM | POA: Insufficient documentation

## 2011-02-19 ENCOUNTER — Encounter: Payer: Self-pay | Admitting: Orthopedic Surgery

## 2011-02-26 ENCOUNTER — Encounter: Payer: Self-pay | Admitting: Orthopedic Surgery

## 2011-02-26 ENCOUNTER — Ambulatory Visit (INDEPENDENT_AMBULATORY_CARE_PROVIDER_SITE_OTHER): Payer: Medicaid Other | Admitting: Orthopedic Surgery

## 2011-02-26 DIAGNOSIS — M1710 Unilateral primary osteoarthritis, unspecified knee: Secondary | ICD-10-CM

## 2011-03-04 NOTE — Assessment & Plan Note (Signed)
Summary: left knee pain swelling/needs xr/ knee went out almost fell/c...   Visit Type:  Follow-up Referring Provider:  ap er Primary Provider:  Dr. Olena Leatherwood  CC:  left knee pain.  History of Present Illness: I saw Jacqueline [name] in the office today for a followup visit.  She is a 60 years old woman with the complaint of:  left knee pain.  ER on 02-03-11. Xrays done show chondrocalcinosis, mild degenerative changes. No acute changes.  Medications: none.  Complaints: She has been having pain and swelling. She states that she fell about 3 weeks before she went to the ER. They put her in a brace and put her on crutches.  A radiograph was done, and it shows no evidence of new fracture, dislocation. She does have medial compartment arthritis.  Catching, locking, giving way doesn't seem to be the issue just pain medially. She also complained of some swelling of her calf during this period.  Allergies: 1)  ! Sulfa 2)  ! Imitrex 3)  ! Toradol 4)  ! Ultram 5)  ! Asa  Physical Exam  Additional Exam:  The patient is well developed and nourished, with normal grooming and hygiene. The body habitus is small frame  LEFT knee examination.  Tenderness over the medial compartment, no effusion. He has full range of motion. Her knee is stable anteriorly, posteriorly, and medial lateral.  There's no neurovascular deficits. No swelling. No tenderness distally.  she was ambulatory with a limp, favoring the LEFT lower extremity.       Impression & Recommendations:  Problem # 1:  OSTEOARTHRITIS, LOWER LEG (ICD-715.16) Verbal consent was obtained. The left knee was prepped with alcohol and ethyl chloride. 1 cc of depomedrol 40mg /cc and 4 cc of lidocaine 1% was injected. there were no complications.  Other Orders: Est. Patient Level III (16109) Joint Aspirate / Injection, Large (20610) Depo- Medrol 40mg  (J1030)  Patient Instructions: 1)  You have received an injection of cortisone today.  You may experience increased pain at the injection site. Apply ice pack to the area for 20 minutes every 2 hours and take 2 xtra strength tylenol every 8 hours. This increased pain will usually resolve in 24 hours. The injection will take effect in 3-10 days.  2)  Please schedule a follow-up appointment as needed.   Orders Added: 1)  Est. Patient Level III [60454] 2)  Joint Aspirate / Injection, Large [20610] 3)  Depo- Medrol 40mg  [J1030]

## 2011-03-07 LAB — URINE MICROSCOPIC-ADD ON

## 2011-03-07 LAB — URINALYSIS, ROUTINE W REFLEX MICROSCOPIC
Glucose, UA: NEGATIVE mg/dL
Ketones, ur: NEGATIVE mg/dL
Leukocytes, UA: NEGATIVE
Protein, ur: NEGATIVE mg/dL
Urobilinogen, UA: 0.2 mg/dL (ref 0.0–1.0)

## 2011-03-24 ENCOUNTER — Emergency Department (HOSPITAL_COMMUNITY): Payer: Medicaid Other

## 2011-03-24 ENCOUNTER — Emergency Department (HOSPITAL_COMMUNITY)
Admission: EM | Admit: 2011-03-24 | Discharge: 2011-03-24 | Disposition: A | Discharge status: Home / Self Care | Payer: Medicaid Other | Attending: Emergency Medicine | Admitting: Emergency Medicine

## 2011-03-24 DIAGNOSIS — I889 Nonspecific lymphadenitis, unspecified: Secondary | ICD-10-CM | POA: Insufficient documentation

## 2011-03-24 DIAGNOSIS — R220 Localized swelling, mass and lump, head: Secondary | ICD-10-CM | POA: Insufficient documentation

## 2011-03-26 ENCOUNTER — Emergency Department (HOSPITAL_COMMUNITY)
Admission: EM | Admit: 2011-03-26 | Discharge: 2011-03-26 | Disposition: A | Discharge status: Home / Self Care | Payer: Medicaid Other | Source: Home / Self Care | Attending: Emergency Medicine | Admitting: Emergency Medicine

## 2011-03-26 ENCOUNTER — Emergency Department (HOSPITAL_COMMUNITY): Payer: Medicaid Other

## 2011-03-26 ENCOUNTER — Emergency Department (HOSPITAL_COMMUNITY)
Admission: EM | Admit: 2011-03-26 | Discharge: 2011-03-27 | Disposition: A | Discharge status: Home / Self Care | Payer: Medicaid Other | Attending: Emergency Medicine | Admitting: Emergency Medicine

## 2011-03-26 DIAGNOSIS — K449 Diaphragmatic hernia without obstruction or gangrene: Secondary | ICD-10-CM | POA: Insufficient documentation

## 2011-03-26 DIAGNOSIS — M549 Dorsalgia, unspecified: Secondary | ICD-10-CM | POA: Insufficient documentation

## 2011-03-26 DIAGNOSIS — L989 Disorder of the skin and subcutaneous tissue, unspecified: Secondary | ICD-10-CM | POA: Insufficient documentation

## 2011-03-26 DIAGNOSIS — Z8249 Family history of ischemic heart disease and other diseases of the circulatory system: Secondary | ICD-10-CM | POA: Insufficient documentation

## 2011-03-26 DIAGNOSIS — Z886 Allergy status to analgesic agent status: Secondary | ICD-10-CM | POA: Insufficient documentation

## 2011-03-26 DIAGNOSIS — R51 Headache: Secondary | ICD-10-CM | POA: Insufficient documentation

## 2011-03-26 DIAGNOSIS — Z888 Allergy status to other drugs, medicaments and biological substances status: Secondary | ICD-10-CM | POA: Insufficient documentation

## 2011-03-26 DIAGNOSIS — L03211 Cellulitis of face: Secondary | ICD-10-CM | POA: Insufficient documentation

## 2011-03-26 DIAGNOSIS — G8929 Other chronic pain: Secondary | ICD-10-CM | POA: Insufficient documentation

## 2011-03-26 DIAGNOSIS — Z882 Allergy status to sulfonamides status: Secondary | ICD-10-CM | POA: Insufficient documentation

## 2011-03-26 DIAGNOSIS — L0201 Cutaneous abscess of face: Secondary | ICD-10-CM | POA: Insufficient documentation

## 2011-03-26 DIAGNOSIS — F411 Generalized anxiety disorder: Secondary | ICD-10-CM | POA: Insufficient documentation

## 2011-03-26 DIAGNOSIS — H5789 Other specified disorders of eye and adnexa: Secondary | ICD-10-CM | POA: Insufficient documentation

## 2011-03-26 LAB — DIFFERENTIAL
Eosinophils Absolute: 0.3 10*3/uL (ref 0.0–0.7)
Eosinophils Relative: 3 % (ref 0–5)
Lymphocytes Relative: 21 % (ref 12–46)
Lymphs Abs: 1.9 10*3/uL (ref 0.7–4.0)
Lymphs Abs: 3.3 10*3/uL (ref 0.7–4.0)
Monocytes Absolute: 0.5 10*3/uL (ref 0.1–1.0)
Monocytes Relative: 5 % (ref 3–12)
Monocytes Relative: 5 % (ref 3–12)
Neutrophils Relative %: 72 % (ref 43–77)

## 2011-03-26 LAB — CBC
HCT: 38 % (ref 36.0–46.0)
MCH: 32.2 pg (ref 26.0–34.0)
MCH: 32.1 pg (ref 26.0–34.0)
MCHC: 33.7 g/dL (ref 30.0–36.0)
MCV: 95.7 fL (ref 78.0–100.0)
MCV: 95.1 fL (ref 78.0–100.0)
Platelets: 225 10*3/uL (ref 150–400)
RBC: 3.97 MIL/uL (ref 3.87–5.11)
RBC: 3.9 MIL/uL (ref 3.87–5.11)
RDW: 14.5 % (ref 11.5–15.5)
WBC: 9.3 10*3/uL (ref 4.0–10.5)

## 2011-03-26 LAB — BASIC METABOLIC PANEL
BUN: 7 mg/dL (ref 6–23)
CO2: 29 mEq/L (ref 19–32)
Chloride: 108 mEq/L (ref 96–112)
Creatinine, Ser: 0.66 mg/dL (ref 0.4–1.2)
Glucose, Bld: 89 mg/dL (ref 70–99)
Potassium: 3.7 mEq/L (ref 3.5–5.1)

## 2011-03-27 MED ORDER — IOHEXOL 300 MG/ML  SOLN
75.0000 mL | Freq: Once | INTRAMUSCULAR | Status: AC | PRN
  Administered 2011-03-27: 75 mL via INTRAVENOUS

## 2011-03-29 LAB — CBC
Hemoglobin: 13.8 g/dL (ref 12.0–15.0)
RDW: 13.5 % (ref 11.5–15.5)

## 2011-03-29 LAB — DIFFERENTIAL
Basophils Relative: 0 % (ref 0–1)
Eosinophils Absolute: 0.1 10*3/uL (ref 0.0–0.7)
Eosinophils Relative: 1 % (ref 0–5)
Monocytes Absolute: 0.6 10*3/uL (ref 0.1–1.0)
Monocytes Relative: 6 % (ref 3–12)

## 2011-03-29 LAB — COMPREHENSIVE METABOLIC PANEL
ALT: 16 U/L (ref 0–35)
AST: 27 U/L (ref 0–37)
Albumin: 4.4 g/dL (ref 3.5–5.2)
Alkaline Phosphatase: 72 U/L (ref 39–117)
GFR calc Af Amer: >60 mL/min (ref >60)
Glucose, Bld: 103 mg/dL — ABNORMAL HIGH (ref 70–99)
Potassium: 3.4 mEq/L — ABNORMAL LOW (ref 3.5–5.1)
Sodium: 141 mEq/L (ref 135–145)
Total Protein: 7.2 g/dL (ref 6.0–8.3)

## 2011-03-29 LAB — POCT CARDIAC MARKERS
CKMB, poc: <1 ng/mL — ABNORMAL LOW (ref 1.0–8.0)
Troponin i, poc: <0.05 ng/mL (ref 0.00–0.09)
Troponin i, poc: <0.05 ng/mL (ref 0.00–0.09)

## 2011-04-08 LAB — DIFFERENTIAL
Basophils Absolute: 0 10*3/uL (ref 0.0–0.1)
Basophils Absolute: 0 10*3/uL (ref 0.0–0.1)
Basophils Relative: 0 % (ref 0–1)
Eosinophils Absolute: 0.3 10*3/uL (ref 0.0–0.7)
Eosinophils Relative: 4 % (ref 0–5)
Eosinophils Relative: 4 % (ref 0–5)
Lymphocytes Relative: 32 % (ref 12–46)
Lymphs Abs: 2.7 10*3/uL (ref 0.7–4.0)
Monocytes Absolute: 0.5 10*3/uL (ref 0.1–1.0)
Neutro Abs: 4.8 10*3/uL (ref 1.7–7.7)
Neutrophils Relative %: 71 % (ref 43–77)

## 2011-04-08 LAB — HEPATIC FUNCTION PANEL
ALT: 10 U/L (ref 0–35)
AST: 18 U/L (ref 0–37)
Albumin: 3.5 g/dL (ref 3.5–5.2)
Alkaline Phosphatase: 65 U/L (ref 39–117)
Alkaline Phosphatase: 71 U/L (ref 39–117)
Indirect Bilirubin: 0.3 mg/dL (ref 0.3–0.9)
Indirect Bilirubin: 0.4 mg/dL (ref 0.3–0.9)
Total Protein: 6 g/dL (ref 6.0–8.3)
Total Protein: 6.2 g/dL (ref 6.0–8.3)

## 2011-04-08 LAB — CBC
HCT: 34.9 % — ABNORMAL LOW (ref 36.0–46.0)
HCT: 36.7 % (ref 36.0–46.0)
Hemoglobin: 12.1 g/dL (ref 12.0–15.0)
MCHC: 34.3 g/dL (ref 30.0–36.0)
MCV: 97.1 fL (ref 78.0–100.0)
Platelets: 231 10*3/uL (ref 150–400)
Platelets: 248 10*3/uL (ref 150–400)
RDW: 13.2 % (ref 11.5–15.5)
WBC: 7.6 10*3/uL (ref 4.0–10.5)
WBC: 8.4 10*3/uL (ref 4.0–10.5)

## 2011-04-08 LAB — BASIC METABOLIC PANEL
BUN: 5 mg/dL — ABNORMAL LOW (ref 6–23)
Calcium: 9.3 mg/dL (ref 8.4–10.5)
Chloride: 105 mEq/L (ref 96–112)
Creatinine, Ser: 0.64 mg/dL (ref 0.4–1.2)
GFR calc non Af Amer: >60 mL/min (ref >60)
Glucose, Bld: 121 mg/dL — ABNORMAL HIGH (ref 70–99)
Potassium: 3.4 mEq/L — ABNORMAL LOW (ref 3.5–5.1)
Potassium: 3.9 mEq/L (ref 3.5–5.1)
Sodium: 137 mEq/L (ref 135–145)

## 2011-04-08 LAB — AMYLASE: Amylase: 69 U/L (ref 27–131)

## 2011-05-06 NOTE — H&P (Signed)
NAMELAKEITA, [name]               ACCOUNT NO.:  [id number]   MEDICAL RECORD NO.:  [id number]          PATIENT TYPE:  INP   LOCATION:  A331                          FACILITY:  [facility]   PHYSICIAN:  Osvaldo Shipper, MD     DATE OF BIRTH:  [birth year]   DATE OF ADMISSION:  12/08/2008  DATE OF DISCHARGE:  LH                              HISTORY & PHYSICAL   PRIMARY PHYSICIAN:  Dr. Lia Hopping in [place], [place].   ADMITTING DIAGNOSES:  1. Acute pancreatitis, etiology unclear.  2. Chest pain, likely result of #1.  3. History of chronic back pains.  4. History of acid reflux disease.  5. History of anxiety disorder.   CHIEF COMPLAINT:  Chest pain and abdominal pain since 10 a.m. this  morning.   HISTORY OF PRESENT ILLNESS:  Patient is a 60 year old Caucasian female  who has a past medical history of GERD, anxiety disorder, chronic back  pains, and hiatal hernia, and what sounds like odynophagia, who was in  her usual state of health until about 10 a.m. this morning when she was  driving with her friend and suddenly experienced retrosternal lower  chest and upper abdominal discomfort.  The pain was described as a  squeezing pain associated with nausea.  It radiated to the back.  She  got a little bit short of breath.  The pain was insidious in onset and  progressively worsened to become 10/10 in intensity.  She got really  scared and worried and came into the ED for further evaluation.   After Dilaudid and nitroglycerin and morphine, her pain level has come  down to 3/10.  She said she has pain associated with swallowing which  has been ongoing for a chronic basis and she has had multiple EGDs done  in the past by Dr. Karilyn Cota but she has never had this severe intensity of  pain.  She has never had a stress test or a cardiac cath in the past.  She has had a colonoscopy about 2-1/2 years ago and last EGD was 4 years  ago.  She does not know the results.  She has mentioned that her  cholesterol levels were checked a few months ago and they were normal.  She did have similar symptoms about 2 months ago but not quite as  intense and this pain subsided spontaneously.   MEDICATIONS AT HOME:  1. Vicodin 7.5/500 three times a day.  2. Prilosec 20 mg daily.  3. Zyrtec 10 mg daily.  4. Ativan 1 mg b.i.d.  5. Magic Mouthwash 3 times a day.  6. Advil over the counter as needed.  7. Prozac which she is not taking.  8. She was on a course of Trimox which also she is not taking at this      time.   ALLERGIES:  IMITREX, SULFA, and TORADOL.   PAST MEDICAL HISTORY:  Positive for hiatal hernia, odynophagia.  She has  had a partial gastrectomy in 1990 for a stomach tumor, which is unknown.  She still has a gallbladder, does not have gallstones that she knows  of.  She has a history of gastritis, GERD, back surgeries, migraine  headaches, anxiety disorder, and panic attacks.   SOCIAL HISTORY:  Lives in [place].  Unemployed, on disabilities.  One  pack per day of smoking.  Occasional alcohol use; last use was a week  and a half ago.  No illicit drug use.   FAMILY HISTORY:  Positive for premature heart disease in her parents,  especially her mother, also in her sister.  History of stroke also  present in the family.   REVIEW OF SYSTEMS:  GENERAL:  Positive for weakness, malaise.  HEENT:  Unremarkable.  CARDIOVASCULAR:  Unremarkable.  RESPIRATORY:  Unremarkable.  GI:  As in HPI.  GU:  Unremarkable.  NEUROLOGIC:  Unremarkable.  PSYCHIATRIC:  Unremarkable.  MUSCULOSKELETAL:  Unremarkable except for back pain.  Other systems unremarkable.   PHYSICAL EXAMINATION:  When she presented to the ED, she had a  temperature of 98.0, blood pressure was 173/89, last recording is  100/51, heart rate in the 60s and 70s, respiratory rate is 18,  saturation 97% on room air.  GENERAL:  A sober, well developed, well nourished white female in no  distress.  HEENT:  There is no pallor, no  icterus.  Oral mucous membranes moist.  No oral lesions are noted.  NECK:  Soft and supple.  No thyromegaly is appreciated.  LUNGS:  Clear to auscultation bilaterally.  No wheezing, rales, or  rhonchi.  CARDIOVASCULAR:  S1, S2 is normal, regular.  No murmurs appreciated.  No  S3, S4.  No rubs, no bruits.  ABDOMEN:  Tender in the epigastric area.  No rebound, rigidity, or  guarding.  No masses or organomegaly is appreciated.  Bowel sounds are  present.  Abdomen is soft.  No right upper quadrant tenderness is  present.  RECTAL:  Exam is not done.  GI examination was deferred.  MUSCULOSKELETAL:  Unremarkable.  NEUROLOGIC:  She is alert, oriented x3.  No focal neurological deficits  are present.   LABORATORY DATA:  Her cardiac enzymes are negative x2. lipase is 167,  amylase is not done.  D-dimer is normal.  Complete metabolic profile is  normal.  CBC is completely normal.  She had a CT scan of her abdomen and  pelvis which showed minimal bibasilar atelectasis; otherwise, nothing  acute was noted.  Pancreas was normal.  Chest x-ray showed chronic  bronchitic changes without anything acute.   Two EKGs are available which are both quite similar.  Sinus bradycardia  at a heart rate of 54.  Normal axis.  Intervals appear to be in the  normal range.  T inversion noted in V1.  No other acute ST changes are  noted.  No other T-wave changes noted.  Both EKGs are stable.   ASSESSMENT:  This is a 60 year old Caucasian female with a history as  discussed earlier who presents with lower chest/upper abdominal pain.  She has elevated lipase consistent with possibly acute pancreatitis.  We  will check amylase level as well.  Etiology is unclear.  Computed  tomography scan did not show any evidence for gallstones, though it was  not a good study for this purpose since contrast was given.  Other  etiologies include auto-immune pancreatitis. None of her medications  that she is on, I believe, can cause  pancreatitis.   PLAN:  1. Acute pancreatitis.  Supportive measures will be initiated.      Amylase will be checked.  ANA will be sent off.  Triglycerides will      be checked.  Urine drug screen will be checked.  GI consult will be      ordered, although I think GI is not available this weekend so this      might have to be done as an outpatient.  Patient will be kept      n.p.o. and narcotics will be given.  2. Anxiety disorder/panic attacks.  Continue with Ativan as needed.  3. GERD.  Continue with Protonix.  4. Chronic back pain.  5. We will also check an ultrasound of the abdomen to check for      gallstones.  6. If the patient's symptoms improve and she remains stable she will      be likely discharged in the next couple of days with outpatient GI      followup.      Osvaldo Shipper, MD  Electronically Signed     GK/MEDQ  D:  12/08/2008  T:  12/08/2008  Job:  [id number]   cc:   R. Roetta Sessions, M.D.  [address]  [place]  [place] [address]   Lia Hopping  Fax: [phone]

## 2011-05-06 NOTE — Op Note (Signed)
Jacqueline [name], Jacqueline [name]               ACCOUNT NO.:  [id number]   MEDICAL RECORD NO.:  [id number]          PATIENT TYPE:  INP   LOCATION:  A331                          FACILITY:  [facility]   PHYSICIAN:  Kassie Mends, M.D.      DATE OF BIRTH:  July 18, 1951   DATE OF PROCEDURE:  12/11/2008  DATE OF DISCHARGE:                               OPERATIVE REPORT   PRIMARY PHYSICIAN:  Dr. Donette Larry.   REFERRING PHYSICIAN:  Dr. Mikeal Hawthorne.   INDICATION FOR EXAM:  Ms. Jacqueline [name] is a 60 year old female who complains  of pain with swallowing.  She was admitted to hospital with sudden onset  of epigastric pain radiating to her back associated with severe nausea.  She did not have any vomiting.  She denies any black stools that look  like tar.  She complains of gastroesophageal reflux disease which is  uncontrolled on omeprazole once a day.  She uses Advil up to 4 daily for  pain in her thumbs.  She reports having abdominal surgery by Dr.  Malvin Johns many years ago for what sounds a perforated ulcer.  The report  is not available to me at this time.   FINDINGS:  1. Normal esophagus without evidence of Barrett's mass, erosion,      ulceration or stricture.  2. A 9-mm submucosal lesion seen in the cardia.  3. Patchy erythema in the body and the antrum with occasional erosion      and no ulceration.  Biopsies obtained via cold forceps to evaluate      for H. pylori gastritis.  4. Normal duodenal bulb and second portion of the duodenum.   DIAGNOSES:  1. No source for her odynophagia identified.  2. Mild gastritis likely secondary to NSAIDs.  3. Epigastric pain appears to be secondary to a mild episode of      pancreatitis.  The patient states her last consumption of alcohol      was 2-3 weeks ago.  The differential diagnosis includes pancreatic      sphincter of Oddi dysfunction or biliary pancreatitis.  Her lipase      was 2.8 times the upper limit of normal.  4. No evidence for partial gastrectomy identified.   RECOMMENDATIONS:  1. Outpatient EUS with Will Outlaw in [place], [place],      to evaluate the submucosal lesion at the cardia and to evaluate for      sludge or microlithiasis.  2. Increase her PPI to b.i.d.  3. Outpatient visit in 2 months with [facility] regarding her      odynophagia and abdominal pain.   MEDICATIONS:  1. Demerol 100 mg IV.  2. Versed 6 mg IV.  3. Phenergan 25 mg IV.   PROCEDURE TECHNIQUE:  Physical exam was performed.  Informed consent was  obtained from the patient after explaining the benefits, risks and  alternatives to the procedure.  The patient was connected to monitor and  placed in left lateral position.  Continuous oxygen was provided by  nasal cannula and IV medicine administered through an indwelling  cannula.  After administration of sedation,  the patient's esophagus was  intubated, and the scope was advanced under direct visualization to the  second portion of the duodenum.  Scope was removed slowly by careful  examining the color, texture, anatomy, and integrity of the mucosa on  the way out.  The patient was recovered in endoscopy and discharged to  the floor in satisfactory condition.   PATH:  Mild gastritis      Kassie Mends, M.D.  Electronically Signed     SM/MEDQ  D:  12/11/2008  T:  12/11/2008  Job:  [id number]   cc:   Lia Hopping  Fax: [phone]

## 2011-05-06 NOTE — Discharge Summary (Signed)
NAMESHADAYA, MARSCHNER               ACCOUNT NO.:  [id number]   MEDICAL RECORD NO.:  [id number]          PATIENT TYPE:  INP   LOCATION:  A331                          FACILITY:  [facility]   PHYSICIAN:  Margaretmary Dys, M.D.DATE OF BIRTH:  [birth year]   DATE OF ADMISSION:  12/08/2008  DATE OF DISCHARGE:  12/21/2009LH                               DISCHARGE SUMMARY   DISCHARGE DIAGNOSES:  1. Acute abdominal pain.  2. Acute pancreatitis.  3. Chronic pain syndrome.  4. Probable gastroesophageal reflux disease.  5. History of anxiety disorder.   DISCHARGE MEDICATIONS:  1. Protonix 40 mg p.o. once a day.  The patient can substitute this      with Prilosec one tablet once a day.  2. Vicodin 7.5/500 t.i.d.  3. Zyrtec 10 mg p.o. daily.  4. Ativan 1 mg p.o. b.i.d.  5. Magic mouth wash three times a day.  6. Prozac.  7. Trimox.   FOLLOW UP:  The patient is to follow-up with the primary care physician  Dr. Olena Leatherwood in [place], [place].   DISCHARGE INSTRUCTIONS:  1. Diet:  Regular diet.  2. Activities:  As tolerated.   HOSPITAL CONSULTATIONS:  Gastroenterology, Dr. Kassie Mends.   REASON FOR CONSULTATION:  Acute pancreatitis.  Recommendation for EGD  was also performed and also possible outpatient EUS to rule out  microlithiasis, small stricture or tumor, endoscopic ultrasound.  Again,  this will be arranged by the Gastroenterology.   PROCEDURES PERFORMED:  EGD.  The patient had very mild gastritis,  otherwise no other abnormalities were found.   HOSPITAL COURSE:  Ms. Kishi is a 60 year old female who had a history  of gastroesophageal reflux disease, anxiety disorder, chronic back pain  and hiatal hernia, what appears to be odynophagia.  The patient reports  being her usual state of health at about 10:00 a.m. when she noted while  she was driving her friend she experienced lower chest pain and upper  abdominal discomfort.  Pain was described as a squeezing with nausea  which  radiated to her back.  The patient reports feeling short of  breath.  Pain was severe and progressed about 10/10 in intensity.  The  patient came to the emergency room for further evaluation.  In the  emergency room, she received some Dilaudid and nitroglycerin and  morphine which improved the pain down to 3/10.  The patient has had some  a ongoing swallowing problems for which she has had multiple EGD's by  Dr. Karilyn Cota.  However, this pain has been more severe.  The patient has  never had a stress test or cardiac catheterization in the past.  Colonoscopies and other EGD's as mentioned above have really been really  been unremarkable.   The patient was evaluated in the emergency room.  Her temperature was  98, blood pressure was 173/89.  However, this improved to 100/51 after  the patient's pain was better controlled.  Heart rate was in the 70s,  respiratory rate 18, oxygen saturation was 97% on room air.  On  examination, there was some mild tenderness in the epigastric area but  no rebound and bowel sounds were positive.  She also has a very mild  right upper quadrant tenderness.   The patient was subsequently admitted to the medical floor.  The patient  was started on IV fluids.  The patient was also placed on proton pump  inhibitor.  The patient's symptoms improved, and the patient had a  subsequent EGD as mentioned above.  On the day of discharge, the  patient's pain had completely resolved.   Her lipase level was also improved.   DISPOSITION:  Home.   PERTINENT LABORATORY DATA:  On the day of admission, cardiac enzymes  were negative x2, lipase was 167, amylase was normal.  D-dimer was  normal.  Complete metabolic profile was normal.  CBC was also completely  normal.  CT scan of abdomen and pelvis showed only very minimal  bibasilar atelectasis.  Otherwise, no acute findings were noted.  Pancreas was normal.  Chest x-ray showed chronic bronchitic changes  without any acute  things.  A 12-lead EKG shows sinus bradycardia with a  rate of 54, normal ST-T with no acute changes..  The patient's lipase  had improved to 28 on the day of discharge.  Ultrasound of her abdomen  showed borderline elevated common bile duct, otherwise was normal.      Margaretmary Dys, M.D.  Electronically Signed     AM/MEDQ  D:  01/16/2009  T:  01/16/2009  Job:  [id number]

## 2011-05-06 NOTE — Consult Note (Signed)
[name], KOORS NO.:  [id number]   MEDICAL RECORD NO.:  [id number]          PATIENT TYPE:  INP   LOCATION:  A331                          FACILITY:  [facility]   PHYSICIAN:  Kassie Mends, M.D.      DATE OF BIRTH:  [birth year]   DATE OF CONSULTATION:  DATE OF DISCHARGE:                                 CONSULTATION   REQUESTING PHYSICIAN/PRIMARY CARE PHYSICIAN:  Dr. Olena Leatherwood in [place], [place].   REASON FOR CONSULTATION:  Possible acute pancreatitis.   HPI:  Jacqueline [name] is a 60 year old Caucasian female who approximately 3  days ago around 10 a.m. developed severe odynophagia and chest pain just  after drinking a Coke.  She describes the pain as a burning type pain.  It was lower retrosternally.  It did radiate through to her back and  across her precordium.  She had hard steady pain for at least 1 hour  when she presented to the emergency room.  The pain was 10/10 on pain  scale.  She did have nausea.  She did not vomit.  She had diaphoresis.  She tells me she felt like she could not belch.  She has never had  this type of pain before.  She has had daily heartburn and indigestion  despite taking Prilosec 10 mg daily.  She felt as though Protonix 40 mg  daily may work better for her, although she has not tried this until  this hospitalization.  She complains of dysphagia.  She did chew her  food very well for the last couple of months.  She feels that her food  gets stuck in her upper esophagus.  She was on antibiotics recently for  pharyngitis.  She occasionally takes Advil.  Denies any other NSAID use.  She does have chronic constipation which alters with loose stools.  Known history of IBS.  She denies any rectal bleeding or melena.  She  has chronic back pain for which she takes hydrocodone 7.5/500 mg.  Her  weight has remained stable.  Her appetite was good up until she  developed pain.  She did have a bump in her lipase of 167.  CT did not  show  pancreatitis, it was December 08, 2008 with contrast.  She did have  mild bibasilar atelectasis.  Her abdominal ultrasound showed a  borderline CBD dilatation of 6.8 mm.   PAST MEDICAL AND SURGICAL HISTORY:  1. Anxiety.  2. Chronic back pain.  3. GERD with a partial gastrectomy due to persistent ulcer by Dr.      Malvin Johns.  She had anemia at that time.  4. She has history of migraine headaches.  5. IBS-her last colonoscopy was April 13, 2000 by Dr. Karilyn Cota, showed      internal and external hemorrhoids.  6. She reports having history of EGD by Dr. Linna Darner 2-3 years ago which      was benign except for hiatal hernia.  She did have erosive reflux      esophagitis in 2000 on last EGD by Dr. Dionicia Abler.   MEDICATIONS PRIOR TO ADMISSION:  1. Hydrocodone 7.5/500 mg t.i.d. p.r.n.  2. Prilosec 20 mg daily.  3. Zyrtec 10 mg daily.  4. Ativan 1 mg b.i.d.  5. Magic mouthwash t.i.d.  6. Advil p.r.n.  7. Prozac, she was not taking.  8. Trimox 500 mg, she just completed   ALLERGIES:  1. IMITREX.  2. SULFA.  3. TORADOL.   FAMILY HISTORY:  Positive for mother with colon cancer diagnosed in her  late 14s, she died at age 85.  Father deceased at 1 secondary to MI.  She has 2 brothers who are deceased, one due to MVA and another due to  strep.  She has 1 healthy sister.   SOCIAL HISTORY:  She has been divorced twice.  She has 2 children ages  87 and 7 who are healthy.  She is disabled due to chronic back pain.   REVIEW OF SYSTEMS:  See HPI, otherwise negative.   PHYSICAL EXAM:  VITAL SIGNS:  Temp 94, pulse 70, respirations 22, blood  pressure 111/67.   **No physical exam dictated**   <LABORATORY STUDIES/>  Total bilirubin 0.8, alkaline phosphatase 51, AST 18, ALT 10, total  protein 5.4, albumin 3.7, amylase 83, lipase 28.  CBC from December 19  shows a white blood cell count of 6.3, hemoglobin 12.2, hematocrit 35.8,  platelets 227.  Calcium 9.4, sodium 142, potassium 4.2, chloride 105,  CO2  31, BUN 8, creatinine 0.67 and glucose 85.  Urine drug screen was  positive for opiates and benzodiazepine.  Lipid profile showed total  cholesterol of 205 and triglycerides of 63.  Amylase 83 and lipase 28.   IMPRESSION:  Jacqueline [name] is a 60 year old Caucasian female with chronic  refractory gastroesophageal reflux disease despite PPI, chronic  dysphagia, and worsening of odynophagia.  She has recently been on  antibiotics.  I suspect she may have either erosive esophagitis or  Candida esophagitis as the culprit.  She also had acute onset of  retrosternal pain and a borderline dilated common bile duct with a bump  in her lipase suggestive of pancreatitis.  Abdominal ultrasound did not  show any gallstones.  She was not found to have pancreatitis on a recent  CT.  I suspect she may have passed a small stone or sludge to attribute  a very mild pancreatitis.  I have discussed this case with Dr. Kassie Mends and our plan of care is outlined below.   PLAN:  1. n.p.o.  2. Obtain consent for EGD with Dr. Cira Servant today.  3. EGD today with possible esophageal dilatation if necessary.  4. Outpatient EUS to rule out microlithiasis, small stricture or      tumor.  5. Agree with PPI.    Thank you for allowing [place] to participate in the care of Ms. Varas.      Lorenza Burton, N.P.      Kassie Mends, M.D.  Electronically Signed    KJ/MEDQ  D:  12/11/2008  T:  12/11/2008  Job:  [id number]   cc:   Lia Hopping  Fax: [phone]   Lorenza Burton, N.P.

## 2011-05-06 NOTE — Group Therapy Note (Signed)
NAMEMERCED, BROUGHAM               ACCOUNT NO.:  [id number]   MEDICAL RECORD NO.:  [id number]          PATIENT TYPE:  INP   LOCATION:  A331                          FACILITY:  [facility]   PHYSICIAN:  Margaretmary Dys, M.D.DATE OF BIRTH:  [birth year]   DATE OF PROCEDURE:  12/10/2008  DATE OF DISCHARGE:                                 PROGRESS NOTE   SUBJECTIVE:  The patient feels much better today. Her abdominal pain is  almost resolved.  The patient feels hungry.  She is scheduled to have an  ultrasound done this morning.  The patient admitted with acute  pancreatitis.   OBJECTIVE:  GENERAL:  Comfortable, not in acute distress.  Well-oriented  in time, place and person.  VITAL SIGNS:  Blood pressure was 100/61 with a pulse of 55, respirations  22, temperature 98.5 degrees Fahrenheit, oxygen saturation was 97% on  room air.  HEENT: Normocephalic, atraumatic.  Oral mucosa was moist with no  exudates.  NECK:  Supple.  No JVD or lymphadenopathy.  LUNGS:  Lungs were clear clinically with good air entry bilaterally.  HEART:  S1-S2.  No excessive gallops or rubs.  ABDOMEN:  Abdomen was soft and nontender.  Bowel sounds are positive.  No masses palpable.  EXTREMITIES:  No pitting pedal edema.  No calf induration or tenderness  noted.  CNS:  Exam was grossly intact with no focal neurological deficits.   LABORATORY/DIAGNOSTICS DATA:  White blood cell count 6.3, hemoglobin  12.2, hematocrit 36, platelet count was normal.  Sodium is 142,  potassium is 4.2, chloride of 105, CO2 was 31, BUN of 8, creatinine was  0.69, glucose of 85.  Amylase level was 83, lipase of 28.  Cardiac  enzymes were negative.  Ultrasound of abdomen showed borderline elevated  common bile duct, otherwise was normal.   ASSESSMENT:  1. Acute pancreatitis which resolved lipase level.  2. A chest pain/upper abdominal pain likely related to acute      pancreatitis and some history of chronic pain syndrome. This is  likely gastroesophageal reflux disease.  3. History of anxiety disorder.   PLAN:  1. We will advance the patient's diet today with clear liquids and      also so as tolerated.  2. We will continue on pain medication p.r.n.  3. We will monitor the patient for the next 24 hours.  If the patient      remains stable would likely be discharged home and be followed up      by the gastroenterologist.      Margaretmary Dys, M.D.  Electronically Signed     AM/MEDQ  D:  12/10/2008  T:  12/10/2008  Job:  [id number]

## 2011-05-06 NOTE — Assessment & Plan Note (Signed)
NAMEMarland Kitchen  Jacqueline [name], Jacqueline [name]                CHART#:  [id number]   DATE:  01/15/2009                       DOB:  1951/01/20   PRIMARY CARE PHYSICIAN:  Currently, the patient does not have a primary  care physician.   CHIEF COMPLAINT:  Follow up of abdominal pain.   SUBJECTIVE:  The patient is here for a followup visit.  She saw Dr.  Cira Servant on 12/11/2008, at the time of EGD during the hospitalization.  She  had a 9-mm submucosal lesion seen in the cardia, a patchy erythema in  the body, in the antrum, with occasional erosion, biopsies negative for  H. pylori.  It was felt her gastritis was secondary to NSAIDs.  Pathology revealed mild gastritis.  She has had an EUS by Dr. Odelia Gage on 01/05/2009 to further evaluate the 9-mm submucosal lesion seen  in the cardia of the stomach as well as abdominal pain.  She had  persistent mild gastritis in the stomach.  She appeared to have a normal  pancreatic parenchyma with no evidence of acute or chronic pancreatitis.  There was some slight wall thickening and irregularity of the  gallbladder wall, felt it could be microlithiasis or very diminutive  stones.  A gastric nodule appeared to be consistent with a leiomyoma.  It was recommended that she come back in one year for further evaluation  of her gastric nodule.  It was felt that her abdominal pain could be  pancreatitis related to passing a stone versus setting of alcohol use.  The patient had had some alcohol consumption prior to her  hospitalization for pancreatitis.  She states she drinks only once or  twice a week usually about 2-3 beers.  Since her hospitalization in  December, she has only had 2-3 beers in total and that was about 10 days  ago.   She complains of ongoing postprandial epigastric right upper quadrant  abdominal pain.  She also complains of postprandial abdominal bloating  and fullness.  She has problems moving her bowels, generally having a  bowel movement 1-2 times a week.   She takes Gas-X for bloating and  drinks warm prune juice to try to have a bowel movement.  If she takes a  laxative, she tends to have too much diarrhea.  She denies any melena or  rectal bleeding.  She is having frequent heartburn.  She started  ranitidine recently, but has had a little bit of a rash seen and thinks  it might be from the medication.  She admits taking ibuprofen about 12  daily for her abdominal pain and her back pain.   CURRENT MEDICATIONS:  1. Ranitidine 150 mg daily.  2. Ativan 1 mg b.i.d.  3. Ibuprofen p.r.n.  4. Voltaren cream daily.  She has just started taking and only has      taken one dose.   ALLERGIES:  Sulfa, Imitrex, Nubain, aspirin, and Toradol.   PHYSICAL EXAMINATION:  VITAL SIGNS:  Weight 121, temp 97.9, blood  pressure 110/68, and pulse 80 GENERAL:  A pleasant, well-nourished, well-  developed Caucasian female in no acute distress.  SKIN:  Warm and dry.  No jaundice.HEENT:  Sclerae nonicteric.  Oropharyngeal mucosa moist and pink.ABDOMEN:  Positive bowel sounds.  Abdomen is soft.  She has mild tenderness in the epigastric right upper  quadrant region to deep palpitation.  No rebound or guarding.  No  organomegaly or masses.  No abdominal bruits or hernias. LOWER  EXTREMITIES:  No edema.   IMPRESSION:  The patient is a 60 year old lady with multiple  gastrointestinal complaints.  She had a recent hospitalization for  abdominal pain, suspected pancreatitis.  She continues to have  postprandial abdominal bloating, epigastric right upper quadrant  abdominal pain.  She states she does well as long as she consumes only  clear liquids.  Whenever, she eats solid food, she started having  problems again.  Endoscopic ultrasound suggested microlithiasis.  This  needs to remain a consideration for her symptoms.  She has very limited  alcohol use at this time and without evidence of chronic pancreatitis,  would not suspect further pain from pancreatitis.  Not  mentioned above,  she did have recent LFTs and lipase on December 29, 2008, after an episode  of pain, which were normal.  She also has refractory gastroesophageal  reflux disease.  She needs to be on a PPI.  She needs to limit her NSAID  use and she has taken entirely too much ibuprofen.  She has chronic  constipation, which needs to be treated as well.  She has a family  history of colon cancer, her mother diagnosed after age of 56.  The  patient's last colonoscopy was 9 years ago.  We will consider repeating  colonoscopy at some time in the near future.   PLAN:  1. Begin Nexium 40 mg daily, #20, prescription for #30 with 5 refills      provided.  2. Begin MiraLax 17 g daily, 527 g, 1 refill.  3. Vicodin 5/500 one p.o. q.4-6 h. p.r.n. pain, #30, 0 refills.  4. Stop ibuprofen.  5. Stop ranitidine.  6. No alcohol use.  7. HIDA scan with fatty meal challenge.  8. We will plan on a colonoscopy at a later date.  9. She will need to have repeat endoscopic ultrasound with Dr. Willis Modena in December 2010 for surveillance of her gastric nodule.   ADDENDUM 2210:  Jacqueline [name] most likely has IBS-constipation predominat.  She should avoid ibuprofen and should for IBS-constipation predominant.  Could consider TCS if she does not respond to Rx for IBS. She had a  first degree relative with colon cancer at age > 25. She did have an  episode of pancreatitis secondary to EtOH or microlithiasis. She should  be instructed to avoid alcohol completely especially if she is continues  to have abdominal pain.  Her HIDA is scheduled for 2810. Would refer for  elective laparascopic cholecystectomy if HIDA suggests biliary  dyskinesia AND patient is having symptoms consistent with biliary colic.  Would not  continue to prescribe narcotics unless patient has biliary pancreatitis  or evidence of cholecystitis.       Tana Coast, P.A.  Electronically Signed     Kassie Mends, M.D.   Electronically Signed    LL/MEDQ  D:  01/15/2009  T:  01/15/2009  Job:  [id number]

## 2011-05-06 NOTE — Op Note (Signed)
NAMEMCKINLEE, DUNK NO.:  [id number]   MEDICAL RECORD NO.:  [id number]          PATIENT TYPE:  INP   LOCATION:  IC05                          FACILITY:  [facility]   PHYSICIAN:  Barbaraann Barthel, M.D. DATE OF BIRTH:  1951-02-12   DATE OF PROCEDURE:  02/14/2009  DATE OF DISCHARGE:                               OPERATIVE REPORT   SURGEON:  Dr. Barbaraann Barthel, MD   PREOPERATIVE DIAGNOSIS:  Cholecystitis secondary to biliary dyskinesia.   POSTOPERATIVE DIAGNOSIS:  Cholecystitis secondary to biliary dyskinesia.   PROCEDURE:  Open cholecystectomy with cholangiogram.   NOTE:  This is a 60 year old white female who had had recurrent episodes  of right upper quadrant pain, nausea, and vomiting, and was hospitalized  for pancreatitis around Christmas time as well.  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  She was referred for the GI Service when hepatobiliary scan  strongly suggested biliary dyskinesia.  The procedure was discussed in  detail, discussing complications not limited to but including bleeding,  infection, damage to bile ducts, perforation of organs, transitory  diarrhea.  Informed consent was obtained.   GROSS OPERATIVE FINDINGS:  The patient had moderate amount of adhesions  in the right upper quadrant because of her previous gastrectomy.  I did  not attempt to perform a laparoscopic procedure.  She had a fairly small  cystic duct.  I did perform a cholangiogram and this was done with  fluoroscopy and this showed good filling into the duodenum and no  problems with the common bile duct.  There were no stones within the  gallbladder.  The liver otherwise appeared to be smooth and without any  masses or abnormalities.  We did not explore the rest of the abdomen  because of the adhesions from her previous surgery.   TECHNIQUE:  The patient was placed in supine position after the adequate  administration of general anesthesia via endotracheal intubation.  A  Foley catheter was aseptically inserted and she was prepped with  Betadine solution and draped in the usual manner.  A right subcostal  incision was carried out through skin, subcutaneous tissue down to the  rectus muscle which was divided with a cautery device to the posterior  sheath and the peritoneum were grasped and opened and the abdomen was  explored with the above findings.  The gallbladder was then removed  moving from the fundus down towards the cystic duct which was clearly  identified.  The cystic artery was clipped and divided as well.  We  performed a cholangiogram and there did not appear to be any filling  defects or any worrisome problems there.  The cystic duct was then  ligated and clipped.  We irrigated with normal saline solution.  I  elected to leave a Jackson-Pratt drain through a separate stab wound  incision and after irrigating with normal saline solution, the  peritoneum was closed  with an O Polysorb suture and the fascia with  figure-of-eight interrupted Polysorb sutures.  I used 10 mL of 0.5%  Sensorcaine to add to postoperative comfort.  The wound was then closed  with stapling device and the drain was sutured in place with 3-0 nylon.  Prior to closure, all sponge, needle, instrument counts were found to be  correct.  1. The specimen was the gallbladder.  2. The wound classification is clean contaminated.   ESTIMATED BLOOD LOSS:  Minimal, maybe 50 mL.  She received 1200 mL of  crystalloids intraoperatively.  There were no complications.      Barbaraann Barthel, M.D.  Electronically Signed     WB/MEDQ  D:  02/14/2009  T:  02/14/2009  Job:  [id number]   cc:   Kassie Mends, M.D.  [address]  [place] , [place] [address]

## 2011-09-25 LAB — COMPREHENSIVE METABOLIC PANEL
Albumin: 3.7 g/dL (ref 3.5–5.2)
Alkaline Phosphatase: 51 U/L (ref 39–117)
BUN: 8 mg/dL (ref 6–23)
Calcium: 9.4 mg/dL (ref 8.4–10.5)
Creatinine, Ser: 0.69 mg/dL (ref 0.4–1.2)
Glucose, Bld: 85 mg/dL (ref 70–99)
Total Protein: 5.4 g/dL — ABNORMAL LOW (ref 6.0–8.3)

## 2011-09-25 LAB — DIFFERENTIAL
Basophils Relative: 1 % (ref 0–1)
Eosinophils Absolute: 0.2 10*3/uL (ref 0.0–0.7)
Lymphocytes Relative: 49 % — ABNORMAL HIGH (ref 12–46)
Lymphs Abs: 2.5 10*3/uL (ref 0.7–4.0)
Lymphs Abs: 3.1 10*3/uL (ref 0.7–4.0)
Monocytes Absolute: 0.3 10*3/uL (ref 0.1–1.0)
Monocytes Absolute: 0.4 10*3/uL (ref 0.1–1.0)
Monocytes Relative: 5 % (ref 3–12)
Monocytes Relative: 5 % (ref 3–12)
Neutro Abs: 2.7 10*3/uL (ref 1.7–7.7)
Neutro Abs: 4.9 10*3/uL (ref 1.7–7.7)
Neutrophils Relative %: 43 % (ref 43–77)
Neutrophils Relative %: 60 % (ref 43–77)

## 2011-09-25 LAB — CARDIAC PANEL(CRET KIN+CKTOT+MB+TROPI)
CK, MB: 1.2 ng/mL (ref 0.3–4.0)
CK, MB: 1.2 ng/mL (ref 0.3–4.0)
Relative Index: INVALID (ref 0.0–2.5)
Total CK: 63 U/L (ref 7–177)
Total CK: 69 U/L (ref 7–177)
Troponin I: 0.01 ng/mL (ref 0.00–0.06)

## 2011-09-25 LAB — RAPID URINE DRUG SCREEN, HOSP PERFORMED
Amphetamines: NOT DETECTED
Barbiturates: NOT DETECTED
Cocaine: NOT DETECTED
Opiates: POSITIVE — AB
Tetrahydrocannabinol: NOT DETECTED

## 2011-09-25 LAB — AMYLASE: Amylase: 83 U/L (ref 27–131)

## 2011-09-25 LAB — POCT CARDIAC MARKERS
CKMB, poc: <1 ng/mL — ABNORMAL LOW (ref 1.0–8.0)
Myoglobin, poc: 25.8 ng/mL (ref 12–200)

## 2011-09-25 LAB — BASIC METABOLIC PANEL
CO2: 29 mEq/L (ref 19–32)
Chloride: 101 mEq/L (ref 96–112)
Creatinine, Ser: 0.92 mg/dL (ref 0.4–1.2)
GFR calc Af Amer: >60 mL/min (ref >60)
Sodium: 140 mEq/L (ref 135–145)

## 2011-09-25 LAB — LIPID PANEL
Cholesterol: 205 mg/dL — ABNORMAL HIGH (ref 0–200)
LDL Cholesterol: 108 mg/dL — ABNORMAL HIGH (ref 0–99)

## 2011-09-25 LAB — D-DIMER, QUANTITATIVE: D-Dimer, Quant: 0.26 ug/mL-FEU (ref 0.00–0.48)

## 2011-09-25 LAB — CBC
HCT: 35.8 % — ABNORMAL LOW (ref 36.0–46.0)
Hemoglobin: 12.2 g/dL (ref 12.0–15.0)
Hemoglobin: 14.2 g/dL (ref 12.0–15.0)
MCHC: 34.1 g/dL (ref 30.0–36.0)
MCV: 97.7 fL (ref 78.0–100.0)
Platelets: 227 10*3/uL (ref 150–400)
RBC: 4.27 MIL/uL (ref 3.87–5.11)
RDW: 13.3 % (ref 11.5–15.5)
WBC: 8.1 10*3/uL (ref 4.0–10.5)

## 2011-09-25 LAB — LIPASE, BLOOD: Lipase: 167 U/L — ABNORMAL HIGH (ref 11–59)

## 2011-09-25 LAB — HEPATIC FUNCTION PANEL
Albumin: 4.4 g/dL (ref 3.5–5.2)
Total Protein: 7.1 g/dL (ref 6.0–8.3)

## 2011-09-25 LAB — ANA: Anti Nuclear Antibody(ANA): NEGATIVE

## 2011-09-26 LAB — GC/CHLAMYDIA PROBE AMP, GENITAL
Chlamydia, DNA Probe: NEGATIVE
GC Probe Amp, Genital: NEGATIVE

## 2011-09-26 LAB — WET PREP, GENITAL
Clue Cells Wet Prep HPF POC: NEGATIVE — AB
Trich, Wet Prep: POSITIVE — AB
Yeast Wet Prep HPF POC: NEGATIVE — AB

## 2011-09-30 ENCOUNTER — Encounter: Payer: Self-pay | Admitting: Gastroenterology

## 2011-09-30 ENCOUNTER — Ambulatory Visit (INDEPENDENT_AMBULATORY_CARE_PROVIDER_SITE_OTHER): Payer: Medicaid Other | Admitting: Gastroenterology

## 2011-09-30 DIAGNOSIS — R1013 Epigastric pain: Secondary | ICD-10-CM

## 2011-09-30 DIAGNOSIS — Z1211 Encounter for screening for malignant neoplasm of colon: Secondary | ICD-10-CM | POA: Insufficient documentation

## 2011-09-30 DIAGNOSIS — K589 Irritable bowel syndrome without diarrhea: Secondary | ICD-10-CM

## 2011-09-30 DIAGNOSIS — D490 Neoplasm of unspecified behavior of digestive system: Secondary | ICD-10-CM | POA: Insufficient documentation

## 2011-09-30 DIAGNOSIS — R1310 Dysphagia, unspecified: Secondary | ICD-10-CM | POA: Insufficient documentation

## 2011-09-30 MED ORDER — ESOMEPRAZOLE MAGNESIUM 40 MG PO CPDR: 40.0000 mg | DELAYED_RELEASE_CAPSULE | Freq: Every day | ORAL | Status: DC

## 2011-09-30 MED ORDER — HYDROCODONE-ACETAMINOPHEN 5-500 MG PO TABS: 1.0000 | ORAL_TABLET | Freq: Four times a day (QID) | ORAL | Status: DC | PRN

## 2011-09-30 MED ORDER — SUCRALFATE 1 GM/10ML PO SUSP: 1.0000 g | Freq: Four times a day (QID) | ORAL | Status: DC | PRN

## 2011-09-30 NOTE — Progress Notes (Signed)
Primary Care Physician:  Toma Deiters, MD  Primary Gastroenterologist:  [name], MD   Chief Complaint  Patient presents with  . Dysphagia    HPI:  Jacqueline [name] is a 60 y.o. female here with complaints of dysphagia. Last seen in July 2010. Didn't get f/u EUS done due to daughter severely ill. Always had trouble swallowing if eat too fast. But now with odynophagia (for months), epigastric pain (feels like pancreatitis). Epigastric pain just started back up. Vomiting with eating solids and migraines off/on. Happened after roast and hotdog. Tolerating full liquids. Ensure. Weight up five pounds. Stools continue to alternate between constipation/diarrhea. Takes Miralax prn but makes lot of diarrhea. No melena, brbpr. Ibuprofen right much the last couple of weeks. Takes 4 a day, some days up to 6.   Current Outpatient Prescriptions  Medication Sig Dispense Refill  . gabapentin (NEURONTIN) 600 MG tablet Take 600 mg by mouth 3 (three) times daily.        Marland Kitchen esomeprazole (NEXIUM) 40 MG capsule Take 1 capsule (40 mg total) by mouth daily.  30 capsule  5  . HYDROcodone-acetaminophen (VICODIN) 5-500 MG per tablet Take 1 tablet by mouth every 6 (six) hours as needed for pain.  20 tablet  0  . sucralfate (CARAFATE) 1 GM/10ML suspension Take 10 mLs (1 g total) by mouth 4 (four) times daily as needed.  420 mL  0    Allergies as of 09/30/2011 - Review Complete 09/30/2011  Allergen Reaction Noted  . Aspirin Other (See Comments)   . Imitrex (sumatriptan base) Other (See Comments) 09/30/2011  . Ketorolac tromethamine Other (See Comments)   . Sulfonamide derivatives Nausea And Vomiting   . Toradol Nausea And Vomiting 09/30/2011  . Sumatriptan Rash     Past Medical History  Diagnosis Date  . Gastric nodule 2009    EUS, ?leiomyoma  . Irritable bowel syndrome   . History of pancreatitis     Biliary and/or etoh?  Marland Kitchen Anxiety   . Chronic back pain   . GERD (gastroesophageal reflux disease)   .  Migraines   . 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 Back surgery /ray cage fusion comberg, gso   . Toe graft   . Cesarean section   . 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  . Tonsillectomy   . Abdominal hysterectomy   . Dilation and curettage of uterus   . Gallbladder surgery 2010  . Eus 12/2008    Dr. Dulce Sellar. Gastric nodule most consistent with Leiomyoma. Slightly thickened and irregular gallbladder wall, possibly due to sludge or diminutive stones. Recommend EUS in January 2011 to followup gastric nodule.  . Esophagogastroduodenoscopy 11/2008    A 9-mm submucosal lesion seen in the cardia., gastritis, no hpylori  . Colonoscopy 2001    Dr. Karilyn Cota, hemorrhoids    Family History  Problem Relation Age of Onset  . Colon cancer Mother     diagnosed in late 60s and died age 62  . Heart defect      family history   . Arthritis      family history  . COPD      family history   .  Heart attack Father     deceased at 54  . Lung cancer Maternal Uncle   . Throat cancer Maternal Uncle   . Cancer      multiple unknown type  . Colon cancer Paternal Uncle     History   Social History  . Marital Status: Divorced    Spouse Name: N/A    Number of Children: 2  . Years of Education: 10th grade   Occupational History  . disabled: back problems     Social History Main Topics  . Smoking status: Current Everyday Smoker -- 1.0 packs/day    Types: Cigarettes  . Smokeless tobacco: Not on file  . Alcohol Use: No     no etoh in over one year  . Drug Use: No  . Sexually Active: Not on file   Other Topics Concern  . Not on file   Social History Narrative  . No narrative on file       ROS:  General: Negative for anorexia, weight loss, fever, chills, fatigue, weakness. Eyes: Negative for vision changes.  ENT: Negative for hoarseness, nasal congestion. CV: Negative for chest pain, angina, palpitations, dyspnea on exertion, peripheral edema.  Respiratory: Negative for dyspnea at rest, dyspnea on exertion, cough, sputum, wheezing.  GI: See history of present illness. GU:  Negative for dysuria, hematuria, urinary incontinence, urinary frequency, nocturnal urination.  MS: Negative for joint pain, low back pain.  Derm: Negative for rash or itching.  Neuro: Negative for weakness, abnormal sensation, seizure, frequent headaches, memory loss, confusion.  Psych: Negative for anxiety, depression, suicidal ideation, hallucinations.  Endo: Negative for unusual weight change.  Heme: Negative for bruising or bleeding. Allergy: Negative for rash or hives.    Physical Examination:  BP 132/88  Pulse 76  Temp(Src) 97.2 F (36.2 C) (Temporal)  Ht 5\' 2"  (1.575 m)  Wt 118 lb 9.6 oz (53.797 kg)  BMI 21.69 kg/m2   General: Well-nourished, well-developed in no acute distress.  Head: Normocephalic, atraumatic.   Eyes: Conjunctiva pink, no icterus. Mouth: Oropharyngeal mucosa moist and pink , no lesions erythema or exudate. Neck: Supple without thyromegaly, masses, or lymphadenopathy.  Lungs: Clear to auscultation bilaterally.  Heart: Regular rate and rhythm, no murmurs rubs or gallops.  Abdomen: Bowel sounds are normal, nontender, nondistended, no hepatosplenomegaly or masses, no abdominal bruits or    hernia , no rebound or guarding.   Rectal: Not performed, deferred to time of colonoscopy. Extremities: No lower extremity edema. No clubbing or deformities.  Neuro: Alert and oriented x 4 , grossly normal neurologically.  Skin: Warm and dry, no rash or jaundice.   Psych: Alert and cooperative, normal mood and affect.

## 2011-09-30 NOTE — Patient Instructions (Addendum)
Please have your blood work done as soon as possible. We have scheduled you for an EGD and colonoscopy with Dr. Darrick Penna. Please see separate instructions. For now, maintain a bland soft diet. Please avoid advil, ibuprofen, aleve. Prescription for Vicodin has been provided to you today for short-term use. Do not restart tramadol while taking vicodin. Please begin Nexium, Carafate which have been called to [facility].

## 2011-10-01 ENCOUNTER — Encounter: Payer: Self-pay | Admitting: Gastroenterology

## 2011-10-01 NOTE — Progress Notes (Signed)
Cc to PCP 

## 2011-10-01 NOTE — Assessment & Plan Note (Signed)
H/O remote gastric tumor in 1992 s/p partial gastrectomy as above. Another gastric tumor in 2009. EUS 2010 benign. Due for follow-up EUS. Await EGD findings, further recommendations to follow.

## 2011-10-01 NOTE — Assessment & Plan Note (Signed)
See assessment and plan for odynophagia.

## 2011-10-01 NOTE — Assessment & Plan Note (Signed)
Colonoscopy in near future.  I have discussed the risks, alternatives, benefits with regards to but not limited to the risk of reaction to medication, bleeding, infection, perforation and the patient is agreeable to proceed. Written consent to be obtained.  

## 2011-10-01 NOTE — Assessment & Plan Note (Addendum)
Recent odynophagia and dysphagia. Also with recurrent epigastric pain. H/O biliary and/or etoh pancreatitis in past. Since then had cholecystectomy. Denies etoh. She has increased ibuprofen use. May have gastritis or PUD. Start PPI, carafate. Avoid NSAIDS.   EGD/ED for further evaluation. Patient refused deep sedation stating she did not like the way it made her feel after her EUS. Felt "funny" for over a week. Previously had EGD on Versed 6mg , Demerol 100mg , Phenergan 25mg  without problems. Currently on less medications as well.  I have discussed the risks, alternatives, benefits with regards to but not limited to the risk of reaction to medication, bleeding, infection, perforation and the patient is agreeable to proceed. Written consent to be obtained.   Obtain labs.

## 2011-10-03 LAB — COMPREHENSIVE METABOLIC PANEL
AST: 15 U/L (ref 0–37)
Alkaline Phosphatase: 80 U/L (ref 39–117)
BUN: 7 mg/dL (ref 6–23)
Glucose, Bld: 96 mg/dL (ref 70–99)
Sodium: 146 mEq/L — ABNORMAL HIGH (ref 135–145)
Total Bilirubin: 0.5 mg/dL (ref 0.3–1.2)

## 2011-10-03 LAB — CBC WITH DIFFERENTIAL/PLATELET
Basophils Absolute: 0 10*3/uL (ref 0.0–0.1)
Basophils Relative: 0 % (ref 0–1)
Eosinophils Absolute: 0.1 10*3/uL (ref 0.0–0.7)
Hemoglobin: 14.2 g/dL (ref 12.0–15.0)
MCH: 30.4 pg (ref 26.0–34.0)
MCHC: 32.3 g/dL (ref 30.0–36.0)
Monocytes Absolute: 0.4 10*3/uL (ref 0.1–1.0)
Monocytes Relative: 4 % (ref 3–12)
Neutrophils Relative %: 77 % (ref 43–77)
RDW: 13.4 % (ref 11.5–15.5)

## 2011-10-07 NOTE — Progress Notes (Signed)
Quick Note:  All labs look good except sodium one point high. Increase water consumption. Procedures as planned. Repeat met-7 in two weeks. ______

## 2011-10-08 ENCOUNTER — Telehealth: Payer: Self-pay | Admitting: Gastroenterology

## 2011-10-08 ENCOUNTER — Other Ambulatory Visit: Payer: Self-pay

## 2011-10-08 DIAGNOSIS — E871 Hypo-osmolality and hyponatremia: Secondary | ICD-10-CM

## 2011-10-08 MED ORDER — HYDROCODONE-ACETAMINOPHEN 5-500 MG PO TABS: 1.0000 | ORAL_TABLET | ORAL | Status: DC | PRN

## 2011-10-08 NOTE — Telephone Encounter (Signed)
Pt called asking if she could have a refill on her Vicodin- she stated she was still having pain when swallowing- she is scheduled for TCS/EGD on 10/30

## 2011-10-08 NOTE — Telephone Encounter (Signed)
Addended by: Tiffany Kocher on: 10/08/2011 01:45 PM   Modules accepted: Orders

## 2011-10-08 NOTE — Progress Notes (Signed)
Quick Note:  Pt informed. Lab order mailed to her to do in 2 weeks. ______

## 2011-10-08 NOTE — Telephone Encounter (Signed)
One refill

## 2011-10-08 NOTE — Telephone Encounter (Signed)
Faxed to [facility]

## 2011-10-08 NOTE — Telephone Encounter (Signed)
LMOM for pt to call and let me know which pharmacy she wants the prescription faxed to .

## 2011-10-11 DIAGNOSIS — D126 Benign neoplasm of colon, unspecified: Secondary | ICD-10-CM

## 2011-10-11 DIAGNOSIS — R1310 Dysphagia, unspecified: Secondary | ICD-10-CM

## 2011-10-11 DIAGNOSIS — K222 Esophageal obstruction: Secondary | ICD-10-CM

## 2011-10-11 DIAGNOSIS — K648 Other hemorrhoids: Secondary | ICD-10-CM

## 2011-10-11 DIAGNOSIS — R109 Unspecified abdominal pain: Secondary | ICD-10-CM

## 2011-10-11 DIAGNOSIS — K2990 Gastroduodenitis, unspecified, without bleeding: Secondary | ICD-10-CM

## 2011-10-11 DIAGNOSIS — R197 Diarrhea, unspecified: Secondary | ICD-10-CM

## 2011-10-11 DIAGNOSIS — K2970 Gastritis, unspecified, without bleeding: Secondary | ICD-10-CM

## 2011-10-17 ENCOUNTER — Other Ambulatory Visit: Payer: Self-pay | Admitting: Gastroenterology

## 2011-10-18 LAB — BASIC METABOLIC PANEL
CO2: 29 mEq/L (ref 19–32)
Glucose, Bld: 92 mg/dL (ref 70–99)
Potassium: 4.3 mEq/L (ref 3.5–5.3)
Sodium: 144 mEq/L (ref 135–145)

## 2011-10-20 MED ORDER — SODIUM CHLORIDE 0.45 % IV SOLN
Freq: Once | INTRAVENOUS | Status: AC
  Administered 2011-10-21: 08:00:00 via INTRAVENOUS

## 2011-10-20 NOTE — H&P (Signed)
Reason for Visit     Dysphagia        Vitals - Last Recorded       BP Pulse Temp(Src) Ht Wt BMI    132/88  76  97.2 F (36.2 C) (Temporal)  5\' 2"  (1.575 m)  118 lb 9.6 oz (53.797 kg)  21.69 kg/m2       Progress Notes     Tana Coast, PA  10/01/2011 10:17 AM  Signed Primary Care Physician:  Toma Deiters, MD   Primary Gastroenterologist:  [name], MD      Chief Complaint   Patient presents with   .  Dysphagia      HPI:  Jacqueline [name] is a 60 y.o. female here with complaints of dysphagia. Last seen in July 2010. Didn't get f/u EUS done due to daughter severely ill. Always had trouble swallowing if eat too fast. But now with odynophagia (for months), epigastric pain (feels like pancreatitis). Epigastric pain just started back up. Vomiting with eating solids and migraines off/on. Happened after roast and hotdog. Tolerating full liquids. Ensure. Weight up five pounds. Stools continue to alternate between constipation/diarrhea. Takes Miralax prn but makes lot of diarrhea. No melena, brbpr. Ibuprofen right much the last couple of weeks. Takes 4 a day, some days up to 6.     Current Outpatient Prescriptions   Medication  Sig  Dispense  Refill   .  gabapentin (NEURONTIN) 600 MG tablet  Take 600 mg by mouth 3 (three) times daily.           Marland Kitchen  esomeprazole (NEXIUM) 40 MG capsule  Take 1 capsule (40 mg total) by mouth daily.   30 capsule   5   .  HYDROcodone-acetaminophen (VICODIN) 5-500 MG per tablet  Take 1 tablet by mouth every 6 (six) hours as needed for pain.   20 tablet   0   .  sucralfate (CARAFATE) 1 GM/10ML suspension  Take 10 mLs (1 g total) by mouth 4 (four) times daily as needed.   420 mL   0       Allergies as of 09/30/2011 - Review Complete 09/30/2011   Allergen  Reaction  Noted   .  Aspirin  Other (See Comments)     .  Imitrex (sumatriptan base)  Other (See Comments)  09/30/2011   .  Ketorolac tromethamine  Other (See Comments)     .  Sulfonamide  derivatives  Nausea And Vomiting     .  Toradol  Nausea And Vomiting  09/30/2011   .  Sumatriptan  Rash         Past Medical History   Diagnosis  Date   .  Gastric nodule  2009       EUS, ?leiomyoma   .  Irritable bowel syndrome     .  History of pancreatitis         Biliary and/or etoh?   Marland Kitchen  Anxiety     .  Chronic back pain     .  GERD (gastroesophageal reflux disease)     .  Migraines     .  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  Back surgery /ray cage fusion comberg, gso     .  Toe graft     .  Cesarean section     .  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   .  Tonsillectomy     .  Abdominal hysterectomy     .  Dilation and curettage of uterus     .  Gallbladder surgery  2010   .  Eus  12/2008       Dr. Dulce Sellar. Gastric nodule most consistent with Leiomyoma. Slightly thickened and irregular gallbladder wall, possibly due to sludge or diminutive stones. Recommend EUS in January 2011 to followup gastric nodule.   .  Esophagogastroduodenoscopy  11/2008       A 9-mm submucosal lesion seen in the cardia., gastritis, no hpylori   .  Colonoscopy  2001       Dr. Karilyn Cota, hemorrhoids       Family History   Problem  Relation  Age of Onset   .  Colon cancer  Mother         diagnosed in late 30s and died age 68   .  Heart defect           family history    .  Arthritis           family history   .  COPD           family history    .  Heart attack  Father         deceased at 21   .  Lung cancer  Maternal Uncle     .  Throat cancer  Maternal Uncle     .  Cancer           multiple unknown type   .  Colon cancer  Paternal Uncle         History       Social History   .   Marital Status:  Divorced       Spouse Name:  N/A       Number of Children:  2   .  Years of Education:  10th grade       Occupational History   .  disabled: back problems          Social History Main Topics   .  Smoking status:  Current Everyday Smoker -- 1.0 packs/day       Types:  Cigarettes   .  Smokeless tobacco:  Not on file   .  Alcohol Use:  No         no etoh in over one year   .  Drug Use:  No   .  Sexually Active:  Not on file       Other Topics  Concern   .  Not on file       Social History Narrative   .  No narrative on file        ROS:   General: Negative for anorexia, weight loss, fever, chills, fatigue, weakness. Eyes: Negative for vision changes.   ENT: Negative for hoarseness, nasal congestion. CV: Negative for chest pain, angina, palpitations, dyspnea on exertion, peripheral edema.   Respiratory: Negative for dyspnea at rest, dyspnea on exertion, cough, sputum, wheezing.   GI: See history of present illness. GU:  Negative for dysuria, hematuria, urinary  incontinence, urinary frequency, nocturnal urination.   MS: Negative for joint pain, low back pain.   Derm: Negative for rash or itching.   Neuro: Negative for weakness, abnormal sensation, seizure, frequent headaches, memory loss, confusion.   Psych: Negative for anxiety, depression, suicidal ideation, hallucinations.   Endo: Negative for unusual weight change.   Heme: Negative for bruising or bleeding. Allergy: Negative for rash or hives.     Physical Examination:   BP 132/88  Pulse 76  Temp(Src) 97.2 F (36.2 C) (Temporal)  Ht 5\' 2"  (1.575 m)  Wt 118 lb 9.6 oz (53.797 kg)  BMI 21.69 kg/m2    General: Well-nourished, well-developed in no acute distress.   Head: Normocephalic, atraumatic.    Eyes: Conjunctiva pink, no icterus. Mouth: Oropharyngeal mucosa moist and pink , no lesions erythema or exudate. Neck: Supple without thyromegaly, masses, or lymphadenopathy.   Lungs: Clear to  auscultation bilaterally.   Heart: Regular rate and rhythm, no murmurs rubs or gallops.   Abdomen: Bowel sounds are normal, nontender, nondistended, no hepatosplenomegaly or masses, no abdominal bruits or    hernia , no rebound or guarding.    Rectal: Not performed, deferred to time of colonoscopy. Extremities: No lower extremity edema. No clubbing or deformities.   Neuro: Alert and oriented x 4 , grossly normal neurologically.   Skin: Warm and dry, no rash or jaundice.    Psych: Alert and cooperative, normal mood and affect.      Glendora Score  10/01/2011  3:30 PM  Signed Cc to PCP  Tana Coast, PA  10/07/2011  9:55 PM  Signed Quick Note:   All labs look good except sodium one point high. Increase water consumption. Procedures as planned. Repeat met-7 in two weeks. ______  Cloria Spring, LPN, LPN  16/09/9603  8:25 AM  Signed Quick Note:   Pt informed. Lab order mailed to her to do in 2 weeks. ______     Gaetana Michaelis, PA  10/01/2011 10:17 AM  Addendum Recent odynophagia and dysphagia. Also with recurrent epigastric pain. H/O biliary and/or etoh pancreatitis in past. Since then had cholecystectomy. Denies etoh. She has increased ibuprofen use. May have gastritis or PUD. Start PPI, carafate. Avoid NSAIDS.    EGD/ED for further evaluation. Patient refused deep sedation stating she did not like the way it made her feel after her EUS. Felt "funny" for over a week. Previously had EGD on Versed 6mg , Demerol 100mg , Phenergan 25mg  without problems. Currently on less medications as well.  I have discussed the risks, alternatives, benefits with regards to but not limited to the risk of reaction to medication, bleeding, infection, perforation and the patient is agreeable to proceed. Written consent to be obtained.     Obtain labs.     Previous Version  EPIGASTRIC PAIN - Tana Coast, PA  10/01/2011 10:14 AM  Signed See assessment and plan for odynophagia.  Colon cancer  screening - Tana Coast, PA  10/01/2011 10:14 AM  Signed Colonoscopy in near future.  I have discussed the risks, alternatives, benefits with regards to but not limited to the risk of reaction to medication, bleeding, infection, perforation and the patient is agreeable to proceed. Written consent to be obtained.     Gastric tumor - Tana Coast, PA  10/01/2011 10:16 AM  Signed H/O remote gastric tumor in 1992 s/p partial gastrectomy as above. Another gastric tumor in 2009. EUS 2010 benign. Due for follow-up EUS. Await EGD findings, further recommendations to follow.

## 2011-10-21 ENCOUNTER — Other Ambulatory Visit: Payer: Self-pay | Admitting: Gastroenterology

## 2011-10-21 ENCOUNTER — Encounter (HOSPITAL_COMMUNITY)
Admission: RE | Disposition: A | Discharge status: Home / Self Care | Payer: Self-pay | Source: Ambulatory Visit | Attending: Gastroenterology

## 2011-10-21 ENCOUNTER — Encounter (HOSPITAL_COMMUNITY): Payer: Self-pay | Admitting: *Deleted

## 2011-10-21 ENCOUNTER — Ambulatory Visit (HOSPITAL_COMMUNITY)
Admission: RE | Admit: 2011-10-21 | Discharge: 2011-10-21 | Disposition: A | Discharge status: Home / Self Care | Payer: Medicaid Other | Source: Ambulatory Visit | Attending: Gastroenterology | Admitting: Gastroenterology

## 2011-10-21 DIAGNOSIS — D490 Neoplasm of unspecified behavior of digestive system: Secondary | ICD-10-CM

## 2011-10-21 DIAGNOSIS — Z1211 Encounter for screening for malignant neoplasm of colon: Secondary | ICD-10-CM

## 2011-10-21 DIAGNOSIS — R1310 Dysphagia, unspecified: Secondary | ICD-10-CM

## 2011-10-21 DIAGNOSIS — R197 Diarrhea, unspecified: Secondary | ICD-10-CM | POA: Insufficient documentation

## 2011-10-21 DIAGNOSIS — D126 Benign neoplasm of colon, unspecified: Secondary | ICD-10-CM | POA: Insufficient documentation

## 2011-10-21 DIAGNOSIS — R1013 Epigastric pain: Secondary | ICD-10-CM

## 2011-10-21 DIAGNOSIS — K222 Esophageal obstruction: Secondary | ICD-10-CM | POA: Insufficient documentation

## 2011-10-21 DIAGNOSIS — K648 Other hemorrhoids: Secondary | ICD-10-CM | POA: Insufficient documentation

## 2011-10-21 DIAGNOSIS — K589 Irritable bowel syndrome without diarrhea: Secondary | ICD-10-CM

## 2011-10-21 DIAGNOSIS — K2940 Chronic atrophic gastritis without bleeding: Secondary | ICD-10-CM | POA: Insufficient documentation

## 2011-10-21 HISTORY — DX: Polyneuropathy, unspecified: G62.9

## 2011-10-21 HISTORY — PX: COLONOSCOPY: SHX5424

## 2011-10-21 SURGERY — COLONOSCOPY
Anesthesia: Moderate Sedation

## 2011-10-21 MED ORDER — MINERAL OIL PO OIL
TOPICAL_OIL | ORAL | Status: AC
  Filled 2011-10-21: qty 30

## 2011-10-21 MED ORDER — MIDAZOLAM HCL 5 MG/5ML IJ SOLN
INTRAMUSCULAR | Status: DC | PRN
  Administered 2011-10-21 (×2): 1 mg via INTRAVENOUS
  Administered 2011-10-21: 2 mg via INTRAVENOUS
  Administered 2011-10-21 (×2): 1 mg via INTRAVENOUS
  Administered 2011-10-21: 2 mg via INTRAVENOUS

## 2011-10-21 MED ORDER — PROMETHAZINE HCL 25 MG/ML IJ SOLN
INTRAMUSCULAR | Status: AC
  Filled 2011-10-21: qty 1

## 2011-10-21 MED ORDER — MIDAZOLAM HCL 5 MG/5ML IJ SOLN
INTRAMUSCULAR | Status: AC
  Filled 2011-10-21: qty 10

## 2011-10-21 MED ORDER — MEPERIDINE HCL 100 MG/ML IJ SOLN
INTRAMUSCULAR | Status: DC | PRN
  Administered 2011-10-21 (×2): 50 mg via INTRAVENOUS
  Administered 2011-10-21 (×2): 25 mg via INTRAVENOUS

## 2011-10-21 MED ORDER — MEPERIDINE HCL 100 MG/ML IJ SOLN
INTRAMUSCULAR | Status: AC
  Filled 2011-10-21: qty 2

## 2011-10-21 MED ORDER — BUTAMBEN-TETRACAINE-BENZOCAINE 2-2-14 % EX AERO
INHALATION_SPRAY | CUTANEOUS | Status: DC | PRN
  Administered 2011-10-21: 2 via TOPICAL

## 2011-10-21 MED ORDER — PROMETHAZINE HCL 25 MG/ML IJ SOLN
INTRAMUSCULAR | Status: DC | PRN
  Administered 2011-10-21 (×2): 12.5 mg via INTRAVENOUS

## 2011-10-21 NOTE — Interval H&P Note (Signed)
History and Physical Interval Note:   10/21/2011   7:54 AM   Jacqueline [name]  has presented today for surgery, with the diagnosis of [facility] PAIN  The various methods of treatment have been discussed with the patient and family. After consideration of risks, benefits and other options for treatment, the patient has consented to  Procedure(s): COLONOSCOPY ESOPHAGOGASTRODUODENOSCOPY (EGD) WITH ESOPHAGEAL DILATION as a surgical intervention .  The patients' history has been reviewed, patient examined, no change in status, stable for surgery.  I have reviewed the patients' chart and labs.  Questions were answered to the patient's satisfaction.     [name]  MD  THE PATIENT WAS EXAMINED AND THERE IS NO CHANGE IN THE PATIENT'S CONDITION SINCE THE ORIGINAL H&P WAS COMPLETED.

## 2011-10-21 NOTE — Progress Notes (Signed)
Quick Note:    Labs look good.  ______

## 2011-10-22 NOTE — Progress Notes (Signed)
Quick Note:  Pt informed ______ 

## 2011-10-24 ENCOUNTER — Encounter (HOSPITAL_COMMUNITY): Payer: Self-pay | Admitting: Gastroenterology

## 2011-10-28 ENCOUNTER — Telehealth: Payer: Self-pay | Admitting: Gastroenterology

## 2011-10-28 NOTE — Telephone Encounter (Signed)
Please call pt. She had POLYPOID LESIONS removed from her colon.HER COLON BIOPSIES WERE NORMAL. HER stomach Bx shows mild gastritis. HER ALTERNATING BOWEL HABITS ARE MOST LIKEY DUE TO IBS. Continue NEXIUM 30 minutes prior to HER FIRST meals. USE MIRALAX FOR CONSTIPATION PRN. TCS in 10 years. High fiber diet. OPV IN FEB 2013.

## 2011-10-28 NOTE — Telephone Encounter (Signed)
Reminder in epic to follow up in Feb 2013 and repeat tcs in 10 years

## 2011-10-30 NOTE — Telephone Encounter (Signed)
Pt informed

## 2011-11-03 NOTE — Telephone Encounter (Signed)
Results Cc to PCP  

## 2011-11-20 NOTE — Progress Notes (Signed)
EUS AFTER OPV FEB 2013

## 2011-12-03 ENCOUNTER — Telehealth: Payer: Self-pay

## 2011-12-03 MED ORDER — PANTOPRAZOLE SODIUM 40 MG PO TBEC: 40.0000 mg | DELAYED_RELEASE_TABLET | Freq: Every day | ORAL | Status: DC

## 2011-12-03 NOTE — Telephone Encounter (Signed)
Called pt because we have received PA for nexium. Pt has tried prilosec and prevacid. Pt said she would like to try the protonix instead of doing a PA for nexium. Please send rx to [facility].

## 2011-12-18 ENCOUNTER — Encounter (HOSPITAL_COMMUNITY): Payer: Self-pay

## 2011-12-18 ENCOUNTER — Emergency Department (HOSPITAL_COMMUNITY)
Admission: EM | Admit: 2011-12-18 | Discharge: 2011-12-18 | Disposition: A | Discharge status: Home / Self Care | Payer: Medicaid Other | Attending: Emergency Medicine | Admitting: Emergency Medicine

## 2011-12-18 ENCOUNTER — Other Ambulatory Visit: Payer: Self-pay

## 2011-12-18 DIAGNOSIS — F419 Anxiety disorder, unspecified: Secondary | ICD-10-CM

## 2011-12-18 DIAGNOSIS — G609 Hereditary and idiopathic neuropathy, unspecified: Secondary | ICD-10-CM | POA: Insufficient documentation

## 2011-12-18 DIAGNOSIS — K589 Irritable bowel syndrome without diarrhea: Secondary | ICD-10-CM | POA: Insufficient documentation

## 2011-12-18 DIAGNOSIS — K219 Gastro-esophageal reflux disease without esophagitis: Secondary | ICD-10-CM | POA: Insufficient documentation

## 2011-12-18 DIAGNOSIS — F17200 Nicotine dependence, unspecified, uncomplicated: Secondary | ICD-10-CM | POA: Insufficient documentation

## 2011-12-18 DIAGNOSIS — G8929 Other chronic pain: Secondary | ICD-10-CM | POA: Insufficient documentation

## 2011-12-18 DIAGNOSIS — IMO0002 Reserved for concepts with insufficient information to code with codable children: Secondary | ICD-10-CM

## 2011-12-18 DIAGNOSIS — M549 Dorsalgia, unspecified: Secondary | ICD-10-CM | POA: Insufficient documentation

## 2011-12-18 DIAGNOSIS — F411 Generalized anxiety disorder: Secondary | ICD-10-CM | POA: Insufficient documentation

## 2011-12-18 LAB — POCT I-STAT TROPONIN I

## 2011-12-18 MED ORDER — GABAPENTIN 300 MG PO CAPS
600.0000 mg | ORAL_CAPSULE | Freq: Once | ORAL | Status: AC
  Administered 2011-12-18: 600 mg via ORAL
  Filled 2011-12-18: qty 2

## 2011-12-18 MED ORDER — PANTOPRAZOLE SODIUM 40 MG PO TBEC
40.0000 mg | DELAYED_RELEASE_TABLET | Freq: Once | ORAL | Status: AC
  Administered 2011-12-18: 40 mg via ORAL
  Filled 2011-12-18: qty 1

## 2011-12-18 MED ORDER — LORAZEPAM 1 MG PO TABS
2.0000 mg | ORAL_TABLET | Freq: Once | ORAL | Status: AC
  Administered 2011-12-18: 2 mg via ORAL
  Filled 2011-12-18: qty 2

## 2011-12-18 MED ORDER — TRAMADOL HCL 50 MG PO TABS
50.0000 mg | ORAL_TABLET | Freq: Once | ORAL | Status: AC
  Administered 2011-12-18: 50 mg via ORAL
  Filled 2011-12-18: qty 1

## 2011-12-18 NOTE — ED Notes (Signed)
N[place] Route notified of pt's request.  Pt is pacing in room and out into hallway.  She is apologetic.
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
## 2011-12-18 NOTE — ED Notes (Signed)
Patient was upset at this time. Patient wants medicine. RN notified.
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
## 2011-12-18 NOTE — ED Notes (Signed)
Again Al Decant, PA notified of pt wanting her regular meds, pt pacing around the dept and talking about leaving AMA

## 2011-12-18 NOTE — ED Provider Notes (Signed)
Medical screening examination/treatment/procedure(s) were performed by non-physician practitioner and as supervising physician I was immediately available for consultation/collaboration.  Nicoletta Dress. Colon Branch, MD 12/18/11 [phone]
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
## 2011-12-28 ENCOUNTER — Encounter (HOSPITAL_COMMUNITY): Payer: Self-pay | Admitting: *Deleted

## 2011-12-28 ENCOUNTER — Emergency Department (HOSPITAL_COMMUNITY)
Admission: EM | Admit: 2011-12-28 | Discharge: 2011-12-28 | Disposition: A | Discharge status: Home / Self Care | Payer: Medicaid Other | Attending: Emergency Medicine | Admitting: Emergency Medicine

## 2011-12-28 DIAGNOSIS — F411 Generalized anxiety disorder: Secondary | ICD-10-CM | POA: Insufficient documentation

## 2011-12-28 DIAGNOSIS — L02419 Cutaneous abscess of limb, unspecified: Secondary | ICD-10-CM

## 2011-12-28 DIAGNOSIS — IMO0002 Reserved for concepts with insufficient information to code with codable children: Secondary | ICD-10-CM | POA: Insufficient documentation

## 2011-12-28 DIAGNOSIS — R450 Nervousness: Secondary | ICD-10-CM | POA: Insufficient documentation

## 2011-12-28 DIAGNOSIS — Z79899 Other long term (current) drug therapy: Secondary | ICD-10-CM | POA: Insufficient documentation

## 2011-12-28 DIAGNOSIS — M79609 Pain in unspecified limb: Secondary | ICD-10-CM | POA: Insufficient documentation

## 2011-12-28 MED ORDER — DOXYCYCLINE HYCLATE 100 MG PO CAPS: 100.0000 mg | ORAL_CAPSULE | Freq: Two times a day (BID) | ORAL | Status: AC

## 2011-12-28 MED ORDER — LIDOCAINE HCL (PF) 1 % IJ SOLN
INTRAMUSCULAR | Status: AC
  Administered 2011-12-28: 2.1 mL via INTRAMUSCULAR
  Filled 2011-12-28: qty 5

## 2011-12-28 MED ORDER — HYDROCODONE-ACETAMINOPHEN 5-325 MG PO TABS
2.0000 | ORAL_TABLET | Freq: Once | ORAL | Status: AC
  Administered 2011-12-28: 2 via ORAL
  Filled 2011-12-28: qty 2

## 2011-12-28 MED ORDER — PREDNISONE 20 MG PO TABS
60.0000 mg | ORAL_TABLET | Freq: Once | ORAL | Status: AC
  Administered 2011-12-28: 60 mg via ORAL
  Filled 2011-12-28: qty 3

## 2011-12-28 MED ORDER — HYDROCODONE-ACETAMINOPHEN 7.5-325 MG PO TABS: 1.0000 | ORAL_TABLET | ORAL | Status: AC | PRN

## 2011-12-28 MED ORDER — DOXYCYCLINE HYCLATE 100 MG PO TABS
100.0000 mg | ORAL_TABLET | Freq: Once | ORAL | Status: AC
  Administered 2011-12-28: 100 mg via ORAL
  Filled 2011-12-28: qty 1

## 2011-12-28 MED ORDER — CEFTRIAXONE SODIUM 1 G IJ SOLR
1.0000 g | Freq: Once | INTRAMUSCULAR | Status: AC
  Administered 2011-12-28: 1 g via INTRAMUSCULAR
  Filled 2011-12-28: qty 10

## 2011-12-28 MED ORDER — PREDNISONE 10 MG PO TABS: 20.0000 mg | ORAL_TABLET | Freq: Every day | ORAL | Status: DC

## 2011-12-28 MED ORDER — ONDANSETRON HCL 4 MG PO TABS
4.0000 mg | ORAL_TABLET | Freq: Once | ORAL | Status: AC
  Administered 2011-12-28: 4 mg via ORAL
  Filled 2011-12-28: qty 1

## 2011-12-28 NOTE — ED Notes (Signed)
Pt states she has an abscess under her left arm since Friday.

## 2011-12-28 NOTE — ED Provider Notes (Signed)
History     CSN: [id number]  Arrival date & time 12/28/11  1191   First MD Initiated Contact with Patient 12/28/11 [phone]      Chief Complaint  Patient presents with  . Abscess    (Consider location/radiation/quality/duration/timing/severity/associated sxs/prior treatment) HPI Comments: Patient states that on January 3 or fourth she began to notice a" pimple" under her left arm, this area continued to enlarge. The area became more red and tender. And now it is so sore she can hardly raise her arm or let her arm, close to her body. She's not had drainage from the area. She's not had fever or chills. She has increasing pain and increase in size of the abscessed area under her left arm. It is of note that she's had this problem in the past. And she presents now for assistance with this particular abscess.  Patient is a 61 y.o. female presenting with abscess. The history is provided by the patient.  Abscess  Pertinent negatives include no cough.    Past Medical History  Diagnosis Date  . Gastric nodule 2009    EUS, ?leiomyoma  . Irritable bowel syndrome   . History of pancreatitis     Biliary and/or etoh?  Marland Kitchen Anxiety   . Chronic back pain   . GERD (gastroesophageal reflux disease)   . Migraines   . 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 Peripheral neuropathy     Past Surgical History  Procedure Date  . Back surgery /ray cage fusion comberg, gso   . Toe graft   . Cesarean section   . 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  . Tonsillectomy   . Abdominal hysterectomy   . Dilation and curettage of uterus   . Gallbladder surgery 2010  . Eus 12/2008    Dr. Dulce Sellar. Gastric nodule most consistent with Leiomyoma. Slightly thickened and  irregular gallbladder wall, possibly due to sludge or diminutive stones. Recommend EUS in January 2011 to followup gastric nodule.  . Esophagogastroduodenoscopy 11/2008    A 9-mm submucosal lesion seen in the cardia., gastritis, no hpylori  . Colonoscopy 2001    Dr. Karilyn Cota, hemorrhoids  . Cholecystectomy   . Colonoscopy 10/21/2011    Procedure: COLONOSCOPY;  Surgeon: Arlyce Harman, MD;  Location: [facility];  Service: Endoscopy;  Laterality: N/A;  8:30    Family History  Problem Relation Age of Onset  . Colon cancer Mother     diagnosed in late 23s and died age 68  . Heart defect      family history   . Arthritis      family history  . COPD      family history   . Heart attack Father     deceased at 28  . Lung cancer Maternal Uncle   . Throat cancer Maternal Uncle   . Cancer      multiple unknown type  . Colon cancer Paternal Uncle   . Colon cancer Maternal Aunt   . Colon cancer Other     History  Substance Use Topics  . Smoking status: Current Everyday Smoker -- 1.0 packs/day for 10 years    Types: Cigarettes  . Smokeless tobacco: Not on file  .  Alcohol Use: No     no etoh in over one year    OB History    Grav Para Term Preterm Abortions TAB SAB Ect Mult Living                  Review of Systems  Constitutional: Negative for activity change.       All ROS Neg except as noted in HPI  HENT: Negative for nosebleeds and neck pain.   Eyes: Negative for photophobia and discharge.  Respiratory: Negative for cough, shortness of breath and wheezing.   Cardiovascular: Negative for chest pain and palpitations.  Gastrointestinal: Negative for abdominal pain and blood in stool.  Genitourinary: Negative for dysuria, frequency and hematuria.  Musculoskeletal: Negative for back pain and arthralgias.  Skin:       abscess  Neurological: Negative for dizziness, seizures and speech difficulty.  Psychiatric/Behavioral: Negative for hallucinations and confusion. The patient  is nervous/anxious.     Allergies  Aspirin; Imitrex; Ketorolac tromethamine; Nsaids; Phenergan; Sulfonamide derivatives; Toradol; and Sumatriptan  Home Medications   Current Outpatient Rx  Name Route Sig Dispense Refill  . DOXYCYCLINE HYCLATE 100 MG PO CAPS Oral Take 1 capsule (100 mg total) by mouth 2 (two) times daily. 14 capsule 0  . ENSURE PO LIQD Oral Take 237 mLs by mouth 2 (two) times daily between meals.      Marland Kitchen GABAPENTIN 600 MG PO TABS Oral Take 600 mg by mouth 3 (three) times daily.      Marland Kitchen HYDROCODONE-ACETAMINOPHEN 7.5-325 MG PO TABS Oral Take 1 tablet by mouth every 4 (four) hours as needed for pain. 20 tablet 0  . HYDROCODONE-ACETAMINOPHEN 5-500 MG PO TABS Oral Take 1 tablet by mouth every 4 (four) hours as needed. For pain     . NAPHAZOLINE-PHENIRAMINE 0.025-0.3 % OP SOLN Both Eyes Place 1 drop into both eyes 4 (four) times daily as needed. For dry eyes     . PANTOPRAZOLE SODIUM 40 MG PO TBEC Oral Take 1 tablet (40 mg total) by mouth daily. 30 minutes before the first meal. 30 tablet 3  . PREDNISONE 10 MG PO TABS Oral Take 2 tablets (20 mg total) by mouth daily. 10 tablet 0  . SUCRALFATE 1 GM/10ML PO SUSP Oral Take 1 g by mouth 4 (four) times daily as needed. For eating difficulties     . TRAMADOL HCL 50 MG PO TABS Oral Take 50-100 mg by mouth every 6 (six) hours as needed. Maximum dose= 8 tablets per day. For pain       BP 147/102  Pulse 92  Temp(Src) 97.9 F (36.6 C) (Oral)  Resp 18  Ht 5\' 2"  (1.575 m)  Wt 115 lb (52.164 kg)  BMI 21.03 kg/m2  SpO2 10%  Physical Exam  Nursing note and vitals reviewed. Constitutional: She is oriented to person, place, and time. She appears well-developed and well-nourished.  Non-toxic appearance.  HENT:  Head: Normocephalic.  Right Ear: Tympanic membrane and external ear normal.  Left Ear: Tympanic membrane and external ear normal.  Eyes: EOM and lids are normal. Pupils are equal, round, and reactive to light.  Neck: Normal range  of motion. Neck supple. Carotid bruit is not present.  Cardiovascular: Normal rate, regular rhythm, normal heart sounds, intact distal pulses and normal pulses.   Pulmonary/Chest: Breath sounds normal. No respiratory distress.  Abdominal: Soft. Bowel sounds are normal. There is no tenderness. There is no guarding.  Musculoskeletal: Normal range of motion.  3.2 cm raised red abscess area with some increased redness surrounding the abscess area. No drainage from the left axilla. No sensory changes of the left upper extremity. The area of the left axilla is tender to touch, and tender with movement of the left upper extremity.  Lymphadenopathy:       Head (right side): No submandibular adenopathy present.       Head (left side): No submandibular adenopathy present.    She has no cervical adenopathy.  Neurological: She is alert and oriented to person, place, and time. She has normal strength. No cranial nerve deficit or sensory deficit.  Skin: Skin is warm and dry.  Psychiatric: She has a normal mood and affect. Her speech is normal.    ED Course  Procedures (including critical care time) Pulse oximetry 100% on room air. Within normal limits by my interpretation. Labs Reviewed - No data to display No results found.   1. Abscess of axilla       MDM  I have reviewed nursing notes, vital signs, and all appropriate lab and imaging results for this patient. Patient's blood pressure is elevated at 147/102. Patient advised to see her primary physician for recheck of this. Patient advised to soak the abscess area in warm water 2 times a day prescriptions for doxycycline and Norco 7.5 given to the patient. Patient to return if any emergent changes problems or concerns.       Kathie Dike, [place] 12/28/11 [phone]

## 2011-12-28 NOTE — ED Provider Notes (Signed)
Medical screening examination/treatment/procedure(s) were performed by non-physician practitioner and as supervising physician I was immediately available for consultation/collaboration.   Benny Lennert, MD 12/28/11 1537

## 2012-01-29 ENCOUNTER — Ambulatory Visit (INDEPENDENT_AMBULATORY_CARE_PROVIDER_SITE_OTHER): Payer: Medicaid Other | Admitting: Gastroenterology

## 2012-01-29 ENCOUNTER — Encounter: Payer: Self-pay | Admitting: Gastroenterology

## 2012-01-29 VITALS — BP 129/87 | HR 86 | Temp 98.1°F | Ht 62.0 in | Wt 118.6 lb

## 2012-01-29 DIAGNOSIS — R1319 Other dysphagia: Secondary | ICD-10-CM

## 2012-01-29 MED ORDER — ONDANSETRON HCL 4 MG PO TABS: ORAL_TABLET | ORAL | Status: DC

## 2012-01-29 MED ORDER — PANTOPRAZOLE SODIUM 40 MG PO TBEC: DELAYED_RELEASE_TABLET | ORAL | Status: DC

## 2012-01-29 NOTE — Assessment & Plan Note (Addendum)
sX NOT RESOLVED. ESO STRICTURE ON LAST EXAM. PT AGITATED DURING PROCEDURE AND NEED TO BE RESTRAINED.  EGD/DIL W/ PROPOFOL-PT FAILED CONSCIOUS SEDATION. OPV IN 3 MOS. BPE  IF SX PERSIST. INCREASE PROTONIX TO TWICE DAILY BEFORE MEALS. USE ZOFRAN AS NEEDED FOR NAUSEA OR VOMITING.

## 2012-01-29 NOTE — Patient Instructions (Signed)
UPPER ENDOSCOPY[Y WITH DILATION MON. INCREASE PROTONIX TO TWICE DAILY BEFORE MEALS. FOLLOW UP IN 3 MOS. USE ZOFRAN AS NEEDED FOR NAUSEA OR VOMITING.

## 2012-01-29 NOTE — Progress Notes (Signed)
Subjective:    Patient ID: Jacqueline [name], female    DOB: 11/30/1951, 60 y.o.   MRN: [id number]  PCP: [id number]  HPI LIQUIDS GO DOWN OK BUT COME BACK. Problems swallowing solids every day but not everytime she eats. Didn't feel like she had a significant improvement in her swallowing after the first time. JUST GOT OUT OF A BAD  RELATIONSHIP. FEELS LIKELY HER MD TOLD HER SIGNIFICANT OTHER EVERYTHING.  HEARTBURN NOT CONTROLLED. sX EVERY DAY.  Past Medical History  Diagnosis Date  . Gastric nodule 2009    EUS, ?leiomyoma  . Irritable bowel syndrome   . History of pancreatitis     Biliary and/or etoh?  . Anxiety   . Chronic back pain   . GERD (gastroesophageal reflux disease)   . Migraines   . 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 Peripheral neuropathy    Past Surgical History  Procedure Date  . Back surgery /ray cage fusion comberg, gso   . Toe graft   . Cesarean section   . 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  . Tonsillectomy   . Abdominal hysterectomy   . Dilation and curettage of uterus   . Gallbladder surgery 2010  . Eus 12/2008    Dr. Outlaw. Gastric nodule most consistent with Leiomyoma. Slightly thickened and irregular gallbladder wall, possibly due to sludge or diminutive stones. Recommend EUS in January 2011 to followup gastric nodule.  . Esophagogastroduodenoscopy 11/2008    A 9-mm submucosal lesion seen in the cardia., gastritis, no hpylori  . Colonoscopy 2001    Dr. Rehman, hemorrhoids  . Cholecystectomy   . Colonoscopy 10/21/2011    Procedure: COLONOSCOPY;  Surgeon: Aaryn Parrilla M Wilgus Deyton, MD;  Location: [facility];  Service: Endoscopy;  Laterality: N/A;  8:30    Allergies  Allergen Reactions  . Aspirin Other (See  Comments)    Upset stomach   . Imitrex (Sumatriptan Base) Other (See Comments)    Fast heart rate  . Ketorolac Tromethamine Other (See Comments)    dawning up in hands   . Nsaids Other (See Comments)    Due to stomach issues  . Phenergan Other (See Comments)    Legs jerking  . Sulfonamide Derivatives Nausea And Vomiting  . Toradol Nausea And Vomiting  . Sumatriptan Rash    Current Outpatient Prescriptions  Medication Sig Dispense Refill  . ENSURE (ENSURE) Take 237 mLs by mouth 2 (two) times daily between meals.        . gabapentin (NEURONTIN) 600 MG tablet Take 600 mg by mouth 3 (three) times daily.        . naphazoline-pheniramine (NAPHCON-A) 0.025-0.3 % ophthalmic solution Place 1 drop into both eyes 4 (four) times daily as needed. For dry eyes     . pantoprazole (PROTONIX) 40 MG tablet Take 1 tablet (40 mg total) by mouth daily. 30 minutes before the first meal.    .      . traMADol (ULTRAM) 50 MG tablet Take 50-100 mg by mouth every 6 (six) hours as needed. Maximum dose= 8 tablets per day. For pain     .      .          Review of Systems       Objective:   Physical Exam  Constitutional: She is oriented to person, place, and time. She appears well-developed. No distress.  HENT:  Head: Normocephalic and atraumatic.  Mouth/Throat: Oropharynx is clear and moist. No oropharyngeal exudate.  Eyes: Pupils are equal, round, and reactive to light. No scleral icterus.  Neck: Normal range of motion. Neck supple.  Cardiovascular: Normal rate, regular rhythm and normal heart sounds.   Pulmonary/Chest: Effort normal and breath sounds normal. No respiratory distress.  Abdominal: Soft. Bowel sounds are normal. She exhibits no distension. There is no tenderness.  Musculoskeletal: Normal range of motion.  Lymphadenopathy:    She has no cervical adenopathy.  Neurological: She is alert and oriented to person, place, and time.       NO FOCAL DEFICITS   Psychiatric:       ANXIOUS MOOD,  FLAT AFFECT          Assessment & Plan:   

## 2012-01-30 ENCOUNTER — Encounter (HOSPITAL_COMMUNITY): Payer: Self-pay | Admitting: Pharmacy Technician

## 2012-01-30 NOTE — Progress Notes (Signed)
Faxed to PCP

## 2012-02-02 ENCOUNTER — Other Ambulatory Visit (HOSPITAL_COMMUNITY): Payer: Medicaid Other

## 2012-02-02 ENCOUNTER — Encounter (HOSPITAL_COMMUNITY)
Admission: RE | Admit: 2012-02-02 | Discharge: 2012-02-02 | Disposition: A | Discharge status: Home / Self Care | Payer: Medicaid Other | Source: Ambulatory Visit | Attending: Gastroenterology | Admitting: Gastroenterology

## 2012-02-02 ENCOUNTER — Encounter (HOSPITAL_COMMUNITY): Payer: Self-pay

## 2012-02-02 LAB — HEMOGLOBIN AND HEMATOCRIT, BLOOD
HCT: 40.3 % (ref 36.0–46.0)
Hemoglobin: 13.4 g/dL (ref 12.0–15.0)

## 2012-02-02 NOTE — Patient Instructions (Addendum)
20 TANEKIA RYANS  02/02/2012   Your procedure is scheduled on:  02/03/12  Report to [facility] at 09:30 AM.  Call this number if you have problems the morning of surgery: [phone]   Remember:   Do not eat food:After Midnight.  May have clear liquids:until Midnight .  Clear liquids include soda, tea, black coffee, apple or grape juice, broth.  Take these medicines the morning of surgery with A SIP OF WATER: Neurontin and Protonix. Take your Vicodin and Ultram only if needed.   Do not wear jewelry, make-up or nail polish.  Do not wear lotions, powders, or perfumes. You may wear deodorant.  Do not shave 48 hours prior to surgery.  Do not bring valuables to the hospital.  Contacts, dentures or bridgework may not be worn into surgery.  Leave suitcase in the car. After surgery it may be brought to your room.  For patients admitted to the hospital, checkout time is 11:00 AM the day of discharge.   Patients discharged the day of surgery will not be allowed to drive home.  Name and phone number of your driver:   Special Instructions: N/A   Please read over the following fact sheets that you were given: Pain Booklet, Anesthesia Post-op Instructions and Care and Recovery After Surgery    Esophagogastroduodenoscopy This is an endoscopic procedure (a procedure that uses a device like a flexible telescope) that allows your caregiver to view the upper stomach and small bowel. This test allows your caregiver to look at the esophagus. The esophagus carries food from your mouth to your stomach. They can also look at your duodenum. This is the first part of the small intestine that attaches to the stomach. This test is used to detect problems in the bowel such as ulcers and inflammation. PREPARATION FOR TEST Nothing to eat after midnight the day before the test. NORMAL FINDINGS Normal esophagus, stomach, and duodenum. Ranges for normal findings may vary among different laboratories and hospitals. You  should always check with your doctor after having lab work or other tests done to discuss the meaning of your test results and whether your values are considered within normal limits. MEANING OF TEST  Your caregiver will go over the test results with you and discuss the importance and meaning of your results, as well as treatment options and the need for additional tests if necessary. OBTAINING THE TEST RESULTS It is your responsibility to obtain your test results. Ask the lab or department performing the test when and how you will get your results. Document Released: 04/10/2005 Document Revised: 08/20/2011 Document Reviewed: 11/17/2008 [facility] Patient Information 2012 [place], [place].   Esophageal Dilatation The esophagus is the long, narrow tube which carries food and liquid from the mouth to the stomach. Esophageal dilatation is the technique used to stretch a blocked or narrowed portion of the esophagus. This procedure is used when a part of the esophagus has become so narrow that it becomes difficult, painful or even impossible to swallow. This is generally an uncomplicated form of treatment. When this is not successful, chest surgery may be required. This is a much more extensive form of treatment with a longer recovery time. CAUSES  Some of the more common causes of blockage or strictures of the esophagus are:  Narrowing from longstanding inflammation (soreness and redness) of the lower esophagus. This comes from the constant exposure of the lower esophagus to the acid which bubbles up from the stomach. Over time this causes scarring  and narrowing of the lower esophagus.   Hiatal hernia in which a small part of the stomach bulges (herniates) up through the diaphragm. This can cause a gradual narrowing of the end of the esophagus.   Schatzki's Ring is a narrow ring of benign (non-cancerous) fibrous tissue which constricts the lower esophagus. The reason for this is not known.   Scleroderma  is a connective tissue disorder that affects the esophagus and makes swallowing difficult.   Achalasia is an absence of nerves to the lower esophagus and to the esophageal sphincter. This is the circular muscle between the stomach and esophagus that relaxes to allow food into the stomach. After swallowing, it contracts to keep food in the stomach. This absence of nerves may be congenital (present since birth). This can cause irregular spasms of the lower esophageal muscle. This spasm does not open up to allow food and fluid through. The result is a persistent blockage with subsequent slow trickling of the esophageal contents into the stomach.   Strictures may develop from swallowing materials which damage the esophagus. Some examples are strong acids or alkalis such as lye.   Growths such as benign (non-cancerous) and malignant (cancerous) tumors can block the esophagus.   Heredity (present since birth) causes.  DIAGNOSIS  Your caregiver often suspects this problem by taking a medical history. They will also do a physical exam. They can then prove their suspicions using X-rays and endoscopy. Endoscopy is an exam in which a tube like a small flexible telescope is used to look at your esophagus.  TREATMENT There are different stretching (dilating) techniques which can be used. Simple bougie dilatation may be done in the office. This usually takes only a couple minutes. A numbing (anesthetic) spray of the throat is used. Endoscopy, when done, is done in an endoscopy suite, under mild sedation. When fluoroscopy is used, the procedure is performed in X-ray. Other techniques require a little longer time. Recovery is usually quick. There is no waiting time to begin eating and drinking to test success of the treatment. Following are some of the methods used. Narrowing of the esophagus is treated by making it bigger. Commonly this is a mechanical problem which can be treated with stretching. This can be done in  different ways. Your caregiver will discuss these with you. Some of the means used are:  A series of graduated (increasing thickness) flexible dilators can be used. These are weighted tubes passed through the esophagus into the stomach. The tubes used become progressively larger until the desired stretched size is reached. Graduated dilators are a simple and quick way of opening the esophagus. No visualization is required.   Another method is the use of endoscopy to place a flexible wire across the stricture. The endoscope is removed and the wire left in place. A dilator with a hole through it from end to end is guided down the esophagus and across the stricture. One or more of these dilators are passed over the wire. At the end of the exam, the wire is removed. This type of treatment may be performed in the X-ray department under fluoroscopy. An advantage of this procedure is the examiner is visualizing the end opening in the esophagus.   Stretching of the esophagus may be done using balloons. Deflated balloons are placed through the endoscope and across the stricture. This type of balloon dilatation is often done at the time of endoscopy or fluoroscopy. Flexible endoscopy allows the examiner to directly view the stricture. A  balloon is inserted in the deflated form into the area of narrowing. It is then inflated with air to a certain pressure that is pre-set for a given circumference. When inflated, it becomes sausage shaped, stretched, and makes the stricture larger.   Achalasia requires a longer larger balloon-type dilator. This is frequently done under X-ray control. In this situation, the spastic muscle fibers in the lower esophagus are stretched.  All of the above procedures make the passage of food and water into the stomach easier. They also make it easier for stomach contents to reflux back into the esophagus. Special medications may be used following the procedure to help prevent further  stricturing. Proton-pump inhibitor medications are good at decreasing the amount of acid in the stomach juice. When stomach juice refluxes into the esophagus, the juice is no longer as acidic and is less likely to burn or scar the esophagus. RISKS AND COMPLICATIONS Esophageal dilatation is usually performed effectively and without problems. Some complications that can occur are:  A small amount of bleeding almost always happens where the stretching takes place. If this is too excessive it may require more aggressive treatment.   An uncommon complication is perforation (making a hole) of the esophagus. The esophagus is thin. It is easy to make a hole in it. If this happens, an operation may be necessary to repair this.   A small, undetected perforation could lead to an infection in the chest. This can be very serious.  HOME CARE INSTRUCTIONS   If you received sedation for your procedure, do not drive, make important decisions, or perform any activities requiring your full coordination. Do not drink alcohol, take sedatives, or use any mind altering chemicals unless instructed by your caregiver.   You may use throat lozenges or warm salt water gargles if you have throat discomfort   You can begin eating and drinking normally on return home unless instructed otherwise. Do not purposely try to force large chunks of food down to test the benefits of your procedure.   Mild discomfort can be eased with sips of ice water.   Medications for discomfort may or may not be needed.  SEEK IMMEDIATE MEDICAL CARE IF:   You begin vomiting up blood.   You develop black tarry stools   You develop chills or an unexplained temperature of over 101 F (38.3 C)   You develop chest or abdominal pain.   You develop shortness of breath or feel lightheaded or faint.   Your swallowing is becoming more painful, difficult, or you are unable to swallow.  MAKE SURE YOU:   Understand these instructions.   Will  watch your condition.   Will get help right away if you are not doing well or get worse.  Document Released: 01/29/2006 Document Revised: 08/20/2011 Document Reviewed: 03/18/2006 [facility] Patient Information 2012 [place], [place].   PATIENT INSTRUCTIONS POST-ANESTHESIA  IMMEDIATELY FOLLOWING SURGERY:  Do not drive or operate machinery for the first twenty four hours after surgery.  Do not make any important decisions for twenty four hours after surgery or while taking narcotic pain medications or sedatives.  If you develop intractable nausea and vomiting or a severe headache please notify your doctor immediately.  FOLLOW-UP:  Please make an appointment with your surgeon as instructed. You do not need to follow up with anesthesia unless specifically instructed to do so.  WOUND CARE INSTRUCTIONS (if applicable):  Keep a dry clean dressing on the anesthesia/puncture wound site if there is drainage.  Once  the wound has quit draining you may leave it open to air.  Generally you should leave the bandage intact for twenty four hours unless there is drainage.  If the epidural site drains for more than 36-48 hours please call the anesthesia department.  QUESTIONS?:  Please feel free to call your physician or the hospital operator if you have any questions, and they will be happy to assist you.     [facility] Anesthesia Department [address] [place] [phone]

## 2012-02-03 ENCOUNTER — Encounter (HOSPITAL_COMMUNITY): Payer: Self-pay | Admitting: Anesthesiology

## 2012-02-03 ENCOUNTER — Encounter (HOSPITAL_COMMUNITY): Payer: Self-pay | Admitting: *Deleted

## 2012-02-03 ENCOUNTER — Ambulatory Visit (HOSPITAL_COMMUNITY)
Admission: RE | Admit: 2012-02-03 | Discharge: 2012-02-03 | Disposition: A | Discharge status: Home / Self Care | Payer: Medicaid Other | Source: Ambulatory Visit | Attending: Gastroenterology | Admitting: Gastroenterology

## 2012-02-03 ENCOUNTER — Ambulatory Visit (HOSPITAL_COMMUNITY): Payer: Medicaid Other | Admitting: Anesthesiology

## 2012-02-03 ENCOUNTER — Encounter (HOSPITAL_COMMUNITY)
Admission: RE | Disposition: A | Discharge status: Home / Self Care | Payer: Self-pay | Source: Ambulatory Visit | Attending: Gastroenterology

## 2012-02-03 DIAGNOSIS — K2970 Gastritis, unspecified, without bleeding: Secondary | ICD-10-CM | POA: Insufficient documentation

## 2012-02-03 DIAGNOSIS — R1310 Dysphagia, unspecified: Secondary | ICD-10-CM

## 2012-02-03 DIAGNOSIS — Z01812 Encounter for preprocedural laboratory examination: Secondary | ICD-10-CM | POA: Insufficient documentation

## 2012-02-03 DIAGNOSIS — K222 Esophageal obstruction: Secondary | ICD-10-CM

## 2012-02-03 HISTORY — PX: SAVORY DILATION: SHX5439

## 2012-02-03 SURGERY — ESOPHAGOGASTRODUODENOSCOPY (EGD) WITH PROPOFOL
Anesthesia: Monitor Anesthesia Care | Site: Esophagus

## 2012-02-03 MED ORDER — FENTANYL CITRATE 0.05 MG/ML IJ SOLN
INTRAMUSCULAR | Status: DC | PRN
  Administered 2012-02-03: 50 ug via INTRAVENOUS
  Administered 2012-02-03 (×2): 25 ug via INTRAVENOUS

## 2012-02-03 MED ORDER — LIDOCAINE HCL (CARDIAC) 10 MG/ML IV SOLN
INTRAVENOUS | Status: DC | PRN
  Administered 2012-02-03: 20 mg via INTRAVENOUS

## 2012-02-03 MED ORDER — FENTANYL CITRATE 0.05 MG/ML IJ SOLN
INTRAMUSCULAR | Status: AC
  Filled 2012-02-03: qty 2

## 2012-02-03 MED ORDER — BUTAMBEN-TETRACAINE-BENZOCAINE 2-2-14 % EX AERO
1.0000 | INHALATION_SPRAY | Freq: Once | CUTANEOUS | Status: AC
  Administered 2012-02-03: 1 via TOPICAL

## 2012-02-03 MED ORDER — MIDAZOLAM HCL 2 MG/2ML IJ SOLN
1.0000 mg | INTRAMUSCULAR | Status: DC | PRN
  Administered 2012-02-03: 2 mg via INTRAVENOUS

## 2012-02-03 MED ORDER — ONDANSETRON HCL 4 MG/2ML IJ SOLN
INTRAMUSCULAR | Status: AC
  Administered 2012-02-03: 4 mg via INTRAVENOUS
  Filled 2012-02-03: qty 2

## 2012-02-03 MED ORDER — MIDAZOLAM HCL 2 MG/2ML IJ SOLN
INTRAMUSCULAR | Status: AC
  Administered 2012-02-03: 2 mg via INTRAVENOUS
  Filled 2012-02-03: qty 2

## 2012-02-03 MED ORDER — MINERAL OIL PO OIL
TOPICAL_OIL | ORAL | Status: AC
  Filled 2012-02-03: qty 30

## 2012-02-03 MED ORDER — PROPOFOL 10 MG/ML IV EMUL
INTRAVENOUS | Status: DC | PRN
  Administered 2012-02-03: 150 ug/kg/min via INTRAVENOUS

## 2012-02-03 MED ORDER — PROPOFOL 10 MG/ML IV EMUL
INTRAVENOUS | Status: AC
  Filled 2012-02-03: qty 20

## 2012-02-03 MED ORDER — STERILE WATER FOR IRRIGATION IR SOLN
Status: DC | PRN
  Administered 2012-02-03: 11:00:00

## 2012-02-03 MED ORDER — LACTATED RINGERS IV SOLN
INTRAVENOUS | Status: DC
  Administered 2012-02-03: 1000 mL via INTRAVENOUS

## 2012-02-03 MED ORDER — LIDOCAINE HCL (PF) 1 % IJ SOLN
INTRAMUSCULAR | Status: AC
  Filled 2012-02-03: qty 5

## 2012-02-03 MED ORDER — ONDANSETRON HCL 4 MG/2ML IJ SOLN
4.0000 mg | Freq: Once | INTRAMUSCULAR | Status: AC
  Administered 2012-02-03: 4 mg via INTRAVENOUS

## 2012-02-03 SURGICAL SUPPLY — 18 items
BLOCK BITE 60FR ADLT L/F BLUE (MISCELLANEOUS) ×3 IMPLANT
ELECT REM PT RETURN 9FT ADLT (ELECTROSURGICAL)
ELECTRODE REM PT RTRN 9FT ADLT (ELECTROSURGICAL) IMPLANT
FLOOR PAD 36X40 (MISCELLANEOUS) ×3
FORCEP RJ3 GP 1.8X160 W-NEEDLE (CUTTING FORCEPS) IMPLANT
FORCEPS BIOP RAD 4 LRG CAP 4 (CUTTING FORCEPS) IMPLANT
NDL SCLEROTHERAPY 25GX240 (NEEDLE) IMPLANT
NEEDLE SCLEROTHERAPY 25GX240 (NEEDLE) IMPLANT
PAD FLOOR 36X40 (MISCELLANEOUS) ×2 IMPLANT
PROBE APC STR FIRE (PROBE) IMPLANT
PROBE INJECTION GOLD (MISCELLANEOUS)
PROBE INJECTION GOLD 7FR (MISCELLANEOUS) IMPLANT
SNARE ROTATE MED OVAL 20MM (MISCELLANEOUS) IMPLANT
SNARE SHORT THROW 13M SML OVAL (MISCELLANEOUS) IMPLANT
SYR 50ML LL SCALE MARK (SYRINGE) IMPLANT
TUBING ENDO SMARTCAP PENTAX (MISCELLANEOUS) ×5 IMPLANT
TUBING IRRIGATION ENDOGATOR (MISCELLANEOUS) ×3 IMPLANT
WATER STERILE IRR 1000ML POUR (IV SOLUTION) ×1 IMPLANT

## 2012-02-03 NOTE — Transfer of Care (Signed)
Immediate Anesthesia Transfer of Care Note  Patient: Jacqueline [name]  Procedure(s) Performed: Procedure(s) (LRB): ESOPHAGOGASTRODUODENOSCOPY (EGD) WITH PROPOFOL (N/A) SAVORY DILATION (N/A)  Patient Location: PACU  Anesthesia Type: MAC  Level of Consciousness: awake  Airway & Oxygen Therapy: Patient Spontanous Breathing.   Post-op Assessment: Report given to PACU RN, Post -op Vital signs reviewed and stable and Patient moving all extremities  Post vital signs: Reviewed and stable  Complications: No apparent anesthesia complications

## 2012-02-03 NOTE — Interval H&P Note (Signed)
History and Physical Interval Note:  02/03/2012 9:54 AM  Jacqueline [name]  has presented today for surgery, with the diagnosis of dysphagia  The various methods of treatment have been discussed with the patient and family. After consideration of risks, benefits and other options for treatment, the patient has consented to  Procedure(s) (LRB): ESOPHAGOGASTRODUODENOSCOPY (EGD) WITH PROPOFOL (N/A) SAVORY DILATION (N/A) MALONEY DILATION (N/A) as a surgical intervention .  The patients' history has been reviewed, patient examined, no change in status, stable for surgery.  I have reviewed the patients' chart and labs.  Questions were answered to the patient's satisfaction.     [employer]

## 2012-02-03 NOTE — Anesthesia Procedure Notes (Signed)
Procedure Name: MAC Date/Time: 02/03/2012 11:15 AM Performed by: Minerva Areola Pre-anesthesia Checklist: Patient being monitored, Emergency Drugs available, Patient identified, Timeout performed and Suction available Patient Re-evaluated:Patient Re-evaluated prior to inductionOxygen Delivery Method: Simple face mask

## 2012-02-03 NOTE — Discharge Instructions (Signed)
I dilated your esophagus. You have a stricture near the base of your esophagus. You have mild gastritis.    CONTINUE PROTONIX TWICE DAILY 30 MINUTES PRIOR TO MEALS. FOLLOW A LOW FAT DIET. FOLLOW UP IN 3 MOS. UPPER ENDOSCOPY AFTER CARE Read the instructions outlined below and refer to this sheet in the next week. These discharge instructions provide you with general information on caring for yourself after you leave the hospital. While your treatment has been planned according to the most current medical practices available, unavoidable complications occasionally occur. If you have any problems or questions after discharge, call DR. Luella Gardenhire, [phone].  ACTIVITY  You may resume your regular activity, but move at a slower pace for the next 24 hours.   Take frequent rest periods for the next 24 hours.   Walking will help get rid of the air and reduce the bloated feeling in your belly (abdomen).   No driving for 24 hours (because of the medicine (anesthesia) used during the test).   You may shower.   Do not sign any important legal documents or operate any machinery for 24 hours (because of the anesthesia used during the test).    NUTRITION  Drink plenty of fluids.   You may resume your normal diet as instructed by your doctor.   Begin with a light meal and progress to your normal diet. Heavy or fried foods are harder to digest and may make you feel sick to your stomach (nauseated).   Avoid alcoholic beverages for 24 hours or as instructed.    MEDICATIONS  You may resume your normal medications.   WHAT YOU CAN EXPECT TODAY  Some feelings of bloating in the abdomen.   Passage of more gas than usual.    IF YOU HAD A BIOPSY TAKEN DURING THE UPPER ENDOSCOPY:  No aspirin products for 3 days.   Eat a soft diet IF YOU HAVE NAUSEA, BLOATING, ABDOMINAL PAIN, OR VOMITING.    FINDING OUT THE RESULTS OF YOUR TEST Not all test results are available during your visit. DR.  Darrick Penna WILL CALL YOU WITHIN 7 DAYS OF YOUR PROCEDUE WITH YOUR RESULTS. Do not assume everything is normal if you have not heard from DR. Siler Mavis IN ONE WEEK, CALL HER OFFICE AT [phone].  SEEK IMMEDIATE MEDICAL ATTENTION AND CALL THE OFFICE: [phone] IF:  You have more than a spotting of blood in your stool.   Your belly is swollen (abdominal distention).   You are nauseated or vomiting.   You have a temperature over 101F.   You have abdominal pain or discomfort that is severe or gets worse throughout the day.  Gastritis  Gastritis is an inflammation (the body's way of reacting to injury and/or infection) of the stomach. It is often caused by viral or bacterial (germ) infections. It can also be caused BY ASPIRIN, BC/GOODY POWDER'S, (IBUPROFEN) MOTRIN, OR ALEVE (NAPROXEN), chemicals (including alcohol), SPICY FOODS, and medications. This illness may be associated with generalized malaise (feeling tired, not well), UPPER ABDOMINAL STOMACH cramps, and fever. One common bacterial cause of gastritis is an organism known as H. Pylori. This can be treated with antibiotics.     ESOPHAGEAL STRICTURE  Esophageal strictures can be caused by stomach acid backing up into the tube that carries food from the mouth down to the stomach (lower esophagus).  TREATMENT There are a number of medicines used to treat reflux/stricture, including: Antacids.  Proton-pump inhibitors: PROTONIX  HOME CARE INSTRUCTIONS Eat 2-3 hours before going to  bed.  Try to reach and maintain a healthy weight.  Do not eat just a few very large meals. Instead, eat 4 TO 6 smaller meals throughout the day.  Try to identify foods and beverages that make your symptoms worse, and avoid these.  Avoid tight clothing.  Do not exercise right after eating.  Low-Fat Diet BREADS, CEREALS, PASTA, RICE, DRIED PEAS, AND BEANS These products are high in carbohydrates and most are low in fat. Therefore, they can be increased in the  diet as substitutes for fatty foods. They too, however, contain calories and should not be eaten in excess. Cereals can be eaten for snacks as well as for breakfast.  Include foods that contain fiber (fruits, vegetables, whole grains, and legumes). Research shows that fiber may lower blood cholesterol levels, especially the water-soluble fiber found in fruits, vegetables, oat products, and legumes. FRUITS AND VEGETABLES It is good to eat fruits and vegetables. Besides being sources of fiber, both are rich in vitamins and some minerals. They help you get the daily allowances of these nutrients. Fruits and vegetables can be used for snacks and desserts. MEATS Limit lean meat, chicken, [place], and fish to no more than 6 ounces per day. Beef, Pork, and Lamb Use lean cuts of beef, pork, and lamb. Lean cuts include:  Extra-lean ground beef.  Arm roast.  Sirloin tip.  Center-cut ham.  Round steak.  Loin chops.  Rump roast.  Tenderloin.  Trim all fat off the outside of meats before cooking. It is not necessary to severely decrease the intake of red meat, but lean choices should be made. Lean meat is rich in protein and contains a highly absorbable form of iron. Premenopausal women, in particular, should avoid reducing lean red meat because this could increase the risk for low red blood cells (iron-deficiency anemia).  Chicken and [place] These are good sources of protein. The fat of poultry can be reduced by removing the skin and underlying fat layers before cooking. Chicken and [place] can be substituted for lean red meat in the diet. Poultry should not be fried or covered with high-fat sauces. Fish and Shellfish Fish is a good source of protein. Shellfish contain cholesterol, but they usually are low in saturated fatty acids. The preparation of fish is important. Like chicken and [place], they should not be fried or covered with high-fat sauces. EGGS Egg whites contain no fat or cholesterol. They can  be eaten often. Try 1 to 2 egg whites instead of whole eggs in recipes or use egg substitutes that do not contain yolk.  MILK AND DAIRY PRODUCTS Use skim or 1% milk instead of 2% or whole milk. Decrease whole milk, natural, and processed cheeses. Use nonfat or low-fat (2%) cottage cheese or low-fat cheeses made from vegetable oils. Choose nonfat or low-fat (1 to 2%) yogurt. Experiment with evaporated skim milk in recipes that call for heavy cream. Substitute low-fat yogurt or low-fat cottage cheese for sour cream in dips and salad dressings. Have at least 2 servings of low-fat dairy products, such as 2 glasses of skim (or 1%) milk each day to help get your daily calcium intake.  FATS AND OILS Butterfat, lard, and beef fats are high in saturated fat and cholesterol. These should be avoided.Vegetable fats do not contain cholesterol. AVOID coconut oil, palm oil, and palm kernel oil, WHICH are very high in saturated fats. These should be limited. These fats are often used in bakery goods, processed foods, popcorn, oils, and nondairy creamers.  Vegetable shortenings and some peanut butters contain hydrogenated oils, which are also saturated fats. Read the labels on these foods and check for saturated vegetable oils.  Desirable liquid vegetable oils are corn oil, cottonseed oil, olive oil, canola oil, safflower oil, soybean oil, and sunflower oil. Peanut oil is not as good, but small amounts are acceptable. Buy a heart-healthy tub margarine that has no partially hydrogenated oils in the ingredients. AVOID Mayonnaise and salad dressings often are made from unsaturated fats.  OTHER EATING TIPS Snacks  Most sweets should be limited as snacks. They tend to be rich in calories and fats, and their caloric content outweighs their nutritional value. Some good choices in snacks are graham crackers, melba toast, soda crackers, bagels (no egg), English muffins, fruits, and vegetables. These snacks are preferable to snack  crackers, [place] fries, and chips. Popcorn should be air-popped or cooked in small amounts of liquid vegetable oil.  Desserts Eat fruit, low-fat yogurt, and fruit ices instead of pastries, cake, and cookies. Sherbet, angel food cake, gelatin dessert, frozen low-fat yogurt, or other frozen products that do not contain saturated fat (pure fruit juice bars, frozen ice pops) are also acceptable.   COOKING METHODS Choose those methods that use little or no fat. They include: Poaching.  Braising.  Steaming.  Grilling.  Baking.  Stir-frying.  Broiling.  Microwaving.  Foods can be cooked in a nonstick pan without added fat, or use a nonfat cooking spray in regular cookware. Limit fried foods and avoid frying in saturated fat. Add moisture to lean meats by using water, broth, cooking wines, and other nonfat or low-fat sauces along with the cooking methods mentioned above. Soups and stews should be chilled after cooking. The fat that forms on top after a few hours in the refrigerator should be skimmed off. When preparing meals, avoid using excess salt. Salt can contribute to raising blood pressure in some people.  EATING AWAY FROM HOME Order entres, potatoes, and vegetables without sauces or butter. When meat exceeds the size of a deck of cards (3 to 4 ounces), the rest can be taken home for another meal. Choose vegetable or fruit salads and ask for low-calorie salad dressings to be served on the side. Use dressings sparingly. Limit high-fat toppings, such as bacon, crumbled eggs, cheese, sunflower seeds, and olives. Ask for heart-healthy tub margarine instead of butter.

## 2012-02-03 NOTE — Anesthesia Postprocedure Evaluation (Signed)
Anesthesia Post Note  Patient: Jacqueline [name]  Procedure(s) Performed: Procedure(s) (LRB): ESOPHAGOGASTRODUODENOSCOPY (EGD) WITH PROPOFOL (N/A) SAVORY DILATION (N/A)  Anesthesia type: MAC  Patient location: PACU  Post pain: Pain level controlled  Post assessment: Post-op Vital signs reviewed, Patient's Cardiovascular Status Stable, Respiratory Function Stable, Patent Airway, No signs of Nausea or vomiting and Pain level controlled  Last Vitals:  Filed Vitals:   02/03/12 1141  BP: 114/67  Pulse: 66  Temp: 37 C  Resp: 14    Post vital signs: Reviewed and stable  Level of consciousness: awake and alert   Complications: No apparent anesthesia complications

## 2012-02-03 NOTE — H&P (View-Only) (Signed)
Subjective:    Patient ID: Jacqueline [name], female    DOB: 02/16/1951, 61 y.o.   MRN: [id number]  PCP: [id number]  HPI LIQUIDS GO DOWN OK BUT COME BACK. Problems swallowing solids every day but not everytime she eats. Didn't feel like she had a significant improvement in her swallowing after the first time. JUST GOT OUT OF A BAD  RELATIONSHIP. FEELS LIKELY HER MD TOLD HER SIGNIFICANT OTHER EVERYTHING.  HEARTBURN NOT CONTROLLED. sX EVERY DAY.  Past Medical History  Diagnosis Date  . Gastric nodule 2009    EUS, ?leiomyoma  . Irritable bowel syndrome   . History of pancreatitis     Biliary and/or etoh?  Marland Kitchen Anxiety   . Chronic back pain   . GERD (gastroesophageal reflux disease)   . Migraines   . 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 Peripheral neuropathy    Past Surgical History  Procedure Date  . Back surgery /ray cage fusion comberg, gso   . Toe graft   . Cesarean section   . 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  . Tonsillectomy   . Abdominal hysterectomy   . Dilation and curettage of uterus   . Gallbladder surgery 2010  . Eus 12/2008    Dr. Dulce Sellar. Gastric nodule most consistent with Leiomyoma. Slightly thickened and irregular gallbladder wall, possibly due to sludge or diminutive stones. Recommend EUS in January 2011 to followup gastric nodule.  . Esophagogastroduodenoscopy 11/2008    A 9-mm submucosal lesion seen in the cardia., gastritis, no hpylori  . Colonoscopy 2001    Dr. Karilyn Cota, hemorrhoids  . Cholecystectomy   . Colonoscopy 10/21/2011    Procedure: COLONOSCOPY;  Surgeon: Arlyce Harman, MD;  Location: [facility];  Service: Endoscopy;  Laterality: N/A;  8:30    Allergies  Allergen Reactions  . Aspirin Other (See  Comments)    Upset stomach   . Imitrex (Sumatriptan Base) Other (See Comments)    Fast heart rate  . Ketorolac Tromethamine Other (See Comments)    dawning up in hands   . Nsaids Other (See Comments)    Due to stomach issues  . Phenergan Other (See Comments)    Legs jerking  . Sulfonamide Derivatives Nausea And Vomiting  . Toradol Nausea And Vomiting  . Sumatriptan Rash    Current Outpatient Prescriptions  Medication Sig Dispense Refill  . ENSURE (ENSURE) Take 237 mLs by mouth 2 (two) times daily between meals.        . gabapentin (NEURONTIN) 600 MG tablet Take 600 mg by mouth 3 (three) times daily.        . naphazoline-pheniramine (NAPHCON-A) 0.025-0.3 % ophthalmic solution Place 1 drop into both eyes 4 (four) times daily as needed. For dry eyes     . pantoprazole (PROTONIX) 40 MG tablet Take 1 tablet (40 mg total) by mouth daily. 30 minutes before the first meal.    .      . traMADol (ULTRAM) 50 MG tablet Take 50-100 mg by mouth every 6 (six) hours as needed. Maximum dose= 8 tablets per day. For pain     .      Marland Kitchen          Review of Systems  Objective:   Physical Exam  Constitutional: She is oriented to person, place, and time. She appears well-developed. No distress.  HENT:  Head: Normocephalic and atraumatic.  Mouth/Throat: Oropharynx is clear and moist. No oropharyngeal exudate.  Eyes: Pupils are equal, round, and reactive to light. No scleral icterus.  Neck: Normal range of motion. Neck supple.  Cardiovascular: Normal rate, regular rhythm and normal heart sounds.   Pulmonary/Chest: Effort normal and breath sounds normal. No respiratory distress.  Abdominal: Soft. Bowel sounds are normal. She exhibits no distension. There is no tenderness.  Musculoskeletal: Normal range of motion.  Lymphadenopathy:    She has no cervical adenopathy.  Neurological: She is alert and oriented to person, place, and time.       NO FOCAL DEFICITS   Psychiatric:       ANXIOUS MOOD,  FLAT AFFECT          Assessment & Plan:

## 2012-02-03 NOTE — Anesthesia Preprocedure Evaluation (Signed)
Anesthesia Evaluation  Patient identified by MRN, date of birth, ID band Patient awake    Reviewed: Allergy & Precautions, H&P , NPO status , Patient's Chart, lab work & pertinent test results  History of Anesthesia Complications Negative for: history of anesthetic complications  Airway Mallampati: I      Dental  (+) Edentulous Upper and Teeth Intact   Pulmonary Current Smoker (am cough),    Pulmonary exam normal       Cardiovascular neg cardio ROS Regular Normal    Neuro/Psych  Headaches, Anxiety  Neuromuscular disease    GI/Hepatic GERD-  Medicated and Controlled,  Endo/Other    Renal/GU      Musculoskeletal   Abdominal   Peds  Hematology   Anesthesia Other Findings   Reproductive/Obstetrics                           Anesthesia Physical Anesthesia Plan  ASA: III  Anesthesia Plan: MAC   Post-op Pain Management:    Induction: Intravenous  Airway Management Planned: Simple Face Mask  Additional Equipment:   Intra-op Plan:   Post-operative Plan:   Informed Consent: I have reviewed the patients History and Physical, chart, labs and discussed the procedure including the risks, benefits and alternatives for the proposed anesthesia with the patient or authorized representative who has indicated his/her understanding and acceptance.     Plan Discussed with:   Anesthesia Plan Comments:         Anesthesia Quick Evaluation

## 2012-02-04 ENCOUNTER — Encounter (HOSPITAL_COMMUNITY): Payer: Self-pay | Admitting: Gastroenterology

## 2012-02-09 NOTE — Progress Notes (Signed)
Pt is aware of OV on 5/8 at 1000 with SF in E30

## 2012-02-12 NOTE — Op Note (Signed)
[facility] [address] [place], [place]  [address]  ENDOSCOPY PROCEDURE REPORT  PATIENT:  Jacqueline [name], Jacqueline [name]  MR#:  [id number] BIRTHDATE:  Sep 25, 1951, 60 yrs. old  GENDER:  female  ENDOSCOPIST:  [name], MD Referred by:  Lia Hopping, M.D.  PROCEDURE DATE:  02/03/2012 PROCEDURE:  EGD with dilatation over guidewire ASA CLASS: INDICATIONS:  DYSPHAGIA  MEDICATIONS:   MAC sedation, administered by CRNA TOPICAL ANESTHETIC:  Cetacaine Spray  DESCRIPTION OF PROCEDURE:     Physical exam was performed. Informed consent was obtained from the patient after explaining the benefits, risks, and alternatives to the procedure.  The patient was connected to the monitor and placed in the left lateral position.  Continuous oxygen was provided by nasal cannula and IV medicine administered through an indwelling cannula.  After administration of sedation, the patient's esophagus was intubated and the  endoscope was advanced under direct visualization to the second portion of the duodenum.  The scope was removed slowly by carefully examining the color, texture, anatomy, and integrity of the mucosa on the way out.  The patient was recovered in endoscopy and discharged home in satisfactory condition. <<PROCEDUREIMAGES>>  A stricture was found in the distal esophagus. ESOPHAGUS DILATED FROM 15 TO 17 MM. THE 17 MM DILATOR PASSED WITH MILD RESISTANCE. Mild gastritis was found. NO BARRETT'S. NL DUODENUM.  COMPLICATIONS:    None  ENDOSCOPIC IMPRESSION: 1) Stricture in the distal esophagus 2) Mild gastritis RECOMMENDATIONS: LOW FAT DIET PROTNIX BID OPV IN 3 MOS. BPE IF DYAPHAGIA CONTI NUES.  REPEAT EXAM:  No  ______________________________ [name], MD  CC:  n. eSIGNED:   Aleicia Kenagy at 02/12/2012 01:33 PM  Donell Beers, [phone]

## 2012-03-12 ENCOUNTER — Other Ambulatory Visit: Payer: Self-pay | Admitting: Neurology

## 2012-03-12 DIAGNOSIS — M542 Cervicalgia: Secondary | ICD-10-CM

## 2012-03-12 DIAGNOSIS — R200 Anesthesia of skin: Secondary | ICD-10-CM

## 2012-03-17 ENCOUNTER — Ambulatory Visit (HOSPITAL_COMMUNITY): Payer: Medicaid Other

## 2012-03-18 ENCOUNTER — Other Ambulatory Visit (HOSPITAL_COMMUNITY): Payer: Medicaid Other

## 2012-03-19 ENCOUNTER — Ambulatory Visit (HOSPITAL_COMMUNITY): Payer: Medicaid Other

## 2012-03-27 ENCOUNTER — Encounter (HOSPITAL_COMMUNITY): Payer: Self-pay | Admitting: Emergency Medicine

## 2012-03-27 ENCOUNTER — Emergency Department (HOSPITAL_COMMUNITY)
Admission: EM | Admit: 2012-03-27 | Discharge: 2012-03-27 | Disposition: A | Discharge status: Home / Self Care | Payer: Medicaid Other | Attending: Emergency Medicine | Admitting: Emergency Medicine

## 2012-03-27 DIAGNOSIS — R51 Headache: Secondary | ICD-10-CM | POA: Insufficient documentation

## 2012-03-27 DIAGNOSIS — F17200 Nicotine dependence, unspecified, uncomplicated: Secondary | ICD-10-CM | POA: Insufficient documentation

## 2012-03-27 DIAGNOSIS — K219 Gastro-esophageal reflux disease without esophagitis: Secondary | ICD-10-CM | POA: Insufficient documentation

## 2012-03-27 DIAGNOSIS — G609 Hereditary and idiopathic neuropathy, unspecified: Secondary | ICD-10-CM | POA: Insufficient documentation

## 2012-03-27 DIAGNOSIS — F411 Generalized anxiety disorder: Secondary | ICD-10-CM | POA: Insufficient documentation

## 2012-03-27 DIAGNOSIS — G43909 Migraine, unspecified, not intractable, without status migrainosus: Secondary | ICD-10-CM

## 2012-03-27 MED ORDER — ONDANSETRON HCL 4 MG PO TABS: 4.0000 mg | ORAL_TABLET | Freq: Four times a day (QID) | ORAL | Status: AC

## 2012-03-27 MED ORDER — HYDROCODONE-ACETAMINOPHEN 5-325 MG PO TABS
1.0000 | ORAL_TABLET | Freq: Once | ORAL | Status: AC
  Administered 2012-03-27: 1 via ORAL
  Filled 2012-03-27: qty 1

## 2012-03-27 MED ORDER — MORPHINE SULFATE 4 MG/ML IJ SOLN
4.0000 mg | Freq: Once | INTRAMUSCULAR | Status: AC
  Administered 2012-03-27: 4 mg via INTRAMUSCULAR
  Filled 2012-03-27: qty 1

## 2012-03-27 MED ORDER — TRAMADOL HCL 50 MG PO TABS: 50.0000 mg | ORAL_TABLET | Freq: Four times a day (QID) | ORAL | Status: AC | PRN

## 2012-03-27 MED ORDER — ONDANSETRON HCL 4 MG/2ML IJ SOLN
4.0000 mg | Freq: Once | INTRAMUSCULAR | Status: AC
  Administered 2012-03-27: 4 mg via INTRAMUSCULAR
  Filled 2012-03-27: qty 2

## 2012-03-27 NOTE — ED Provider Notes (Signed)
History     CSN: [id number]  Arrival date & time 03/27/12  0847   First MD Initiated Contact with Patient 03/27/12 [phone]      Chief Complaint  Patient presents with  . Headache    (Consider location/radiation/quality/duration/timing/severity/associated sxs/prior treatment) Patient is a 61 y.o. female presenting with headaches. The history is provided by the patient. No language interpreter was used.  Headache  This is a new problem. The current episode started 12 to 24 hours ago. The problem occurs constantly. The problem has not changed since onset.The headache is associated with bright light and emotional stress. The pain is located in the right unilateral and temporal region. The quality of the pain is described as throbbing. The pain is moderate. The pain does not radiate. Associated symptoms include nausea and vomiting. Pertinent negatives include no fever, no chest pressure, no near-syncope, no palpitations and no shortness of breath. Treatments tried: ultram. The treatment provided no relief.    Past Medical History  Diagnosis Date  . Gastric nodule 2009    EUS, ?leiomyoma  . Irritable bowel syndrome   . History of pancreatitis     Biliary and/or etoh?  Marland Kitchen Anxiety   . Chronic back pain   . GERD (gastroesophageal reflux disease)   . Migraines   . 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 Peripheral neuropathy     Past Surgical History  Procedure Date  . Back surgery /ray cage fusion comberg, gso   . Toe graft   . Cesarean section   . 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  . Tonsillectomy   . Abdominal hysterectomy   . Dilation and curettage of uterus   . Gallbladder surgery 2010  . Eus 12/2008    Dr. Dulce Sellar. Gastric  nodule most consistent with Leiomyoma. Slightly thickened and irregular gallbladder wall, possibly due to sludge or diminutive stones. Recommend EUS in January 2011 to followup gastric nodule.  . Esophagogastroduodenoscopy 11/2008    A 9-mm submucosal lesion seen in the cardia., gastritis, no hpylori  . Colonoscopy 2001    Dr. Karilyn Cota, hemorrhoids  . Cholecystectomy   . Colonoscopy 10/21/2011    Procedure: COLONOSCOPY;  Surgeon: Arlyce Harman, MD;  Location: [facility];  Service: Endoscopy;  Laterality: N/A;  8:30  . Savory dilation 02/03/2012    Procedure: SAVORY DILATION;  Surgeon: Arlyce Harman, MD;  Location: [facility];  Service: Endoscopy;  Laterality: N/A;  dilated with savory  # 15,16,17  . Back surgery   . Abdominal surgery     Family History  Problem Relation Age of Onset  . Colon cancer Mother     diagnosed in late 64s and died age 10  . Heart defect      family history   . Arthritis      family history  . COPD      family history   . Cancer      multiple unknown type  . Heart attack Father     deceased at 71  . Lung cancer Maternal Uncle   . Throat cancer Maternal Uncle   . Colon cancer Paternal Uncle   . Colon cancer Maternal Aunt   . Colon cancer Other   .  Anesthesia problems Neg Hx   . Hypotension Neg Hx   . Malignant hyperthermia Neg Hx   . Pseudochol deficiency Neg Hx     History  Substance Use Topics  . Smoking status: Current Everyday Smoker -- 0.7 packs/day for 35 years    Types: Cigarettes  . Smokeless tobacco: Never Used  . Alcohol Use: Yes     occasional holiday drink    OB History    Grav Para Term Preterm Abortions TAB SAB Ect Mult Living                  Review of Systems  Constitutional: Positive for appetite change. Negative for fever and chills.  HENT: Negative for congestion, facial swelling, neck pain and neck stiffness.   Eyes: Positive for photophobia.  Respiratory: Negative for shortness of breath.   Cardiovascular: Negative  for chest pain, palpitations and near-syncope.  Gastrointestinal: Positive for nausea and vomiting. Negative for abdominal pain.  Skin: Negative.   Neurological: Positive for headaches. Negative for dizziness, weakness and numbness.  Psychiatric/Behavioral: Negative for confusion.  All other systems reviewed and are negative.    Allergies  Aspirin; Imitrex; Ketorolac tromethamine; Nsaids; Phenergan; Sulfonamide derivatives; Toradol; and Sumatriptan  Home Medications   Current Outpatient Rx  Name Route Sig Dispense Refill  . GABAPENTIN 600 MG PO TABS Oral Take 600 mg by mouth 3 (three) times daily.     Marland Kitchen HYDROCODONE-ACETAMINOPHEN 5-500 MG PO TABS Oral Take 1 tablet by mouth every 4 (four) hours as needed. For pain     . PANTOPRAZOLE SODIUM 40 MG PO TBEC Oral Take 40 mg by mouth 2 (two) times daily.    . TRAMADOL HCL 50 MG PO TABS Oral Take 50 mg by mouth every 6 (six) hours as needed.       BP 134/77  Pulse 71  Temp(Src) 98.2 F (36.8 C) (Oral)  Resp 17  Ht 5\' 2"  (1.575 m)  Wt 123 lb (55.792 kg)  BMI 22.50 kg/m2  SpO2 100%  Physical Exam  Nursing note and vitals reviewed. Constitutional: She is oriented to person, place, and time. She appears well-developed and well-nourished. No distress.  HENT:  Head: Normocephalic and atraumatic.  Mouth/Throat: Oropharynx is clear and moist.  Eyes: Conjunctivae and EOM are normal. Pupils are equal, round, and reactive to light.  Neck: Normal range of motion. Neck supple. No Brudzinski's sign and no Kernig's sign noted.  Cardiovascular: Normal rate, regular rhythm, normal heart sounds and intact distal pulses.   No murmur heard. Pulmonary/Chest: Effort normal and breath sounds normal. No respiratory distress.  Abdominal: Soft. She exhibits no distension. There is no tenderness.  Musculoskeletal: Normal range of motion. She exhibits no edema.  Lymphadenopathy:    She has no cervical adenopathy.  Neurological: She is alert and oriented  to person, place, and time. No cranial nerve deficit or sensory deficit. She exhibits normal muscle tone. Coordination and gait normal. GCS eye subscore is 4. GCS verbal subscore is 5. GCS motor subscore is 6.  Reflex Scores:      Tricep reflexes are 2+ on the right side and 2+ on the left side.      Bicep reflexes are 2+ on the right side and 2+ on the left side.      Brachioradialis reflexes are 2+ on the right side and 2+ on the left side.      Patellar reflexes are 2+ on the right side and 2+ on the left side.  Achilles reflexes are 2+ on the right side and 2+ on the left side. Skin: Skin is warm and dry.    ED Course  Procedures (including critical care time)      MDM    Previous ED charts and nursing notes were reviewed by me   Patient with a right-sided temporal headache of gradual onset that began earlier this morning. States she has history of migraines and this headache feels similar to previous. Her vital signs are stable, she is nontoxic appearing. She is ambulatory with a steady gait. No focal neuro deficits on her exam, no meningeal signs. Patient has multiple medication allergies I will treat with IM injections of morphine and Zofran.  Patient is feeling better and requesting to go home I advised her to followup with her primary care physician or to return to the ER for any worsening symptoms.  I discussed patient's history and care plan with the EDP.   Patient / Family / Caregiver understand and agree with initial ED impression and plan with expectations set for ED visit. Pt stable in ED with no significant deterioration in condition. Pt feels improved after observation and/or treatment in ED.     Gaynell Eggleton L. Raine Blodgett, [place] 03/27/12 1104

## 2012-03-27 NOTE — Discharge Instructions (Signed)

## 2012-03-27 NOTE — ED Notes (Signed)
Patient with c/o right frontal headache x 1 day. History of migraines, states this feels the same. +nausea/vomiting. Reports dry heaves as well. Alert/oriented x 4. Ambulatory to room with steady gait.

## 2012-03-27 NOTE — ED Provider Notes (Signed)
Medical screening examination/treatment/procedure(s) were performed by non-physician practitioner and as supervising physician I was immediately available for consultation/collaboration.   Joya Gaskins, MD 03/27/12 [phone]

## 2012-04-08 ENCOUNTER — Ambulatory Visit (HOSPITAL_COMMUNITY)
Admission: RE | Admit: 2012-04-08 | Discharge: 2012-04-08 | Disposition: A | Discharge status: Home / Self Care | Payer: Medicaid Other | Source: Ambulatory Visit | Attending: Neurology | Admitting: Neurology

## 2012-04-08 DIAGNOSIS — R209 Unspecified disturbances of skin sensation: Secondary | ICD-10-CM | POA: Insufficient documentation

## 2012-04-08 DIAGNOSIS — M5030 Other cervical disc degeneration, unspecified cervical region: Secondary | ICD-10-CM | POA: Insufficient documentation

## 2012-04-08 DIAGNOSIS — R200 Anesthesia of skin: Secondary | ICD-10-CM

## 2012-04-08 DIAGNOSIS — M4802 Spinal stenosis, cervical region: Secondary | ICD-10-CM | POA: Insufficient documentation

## 2012-04-08 DIAGNOSIS — M542 Cervicalgia: Secondary | ICD-10-CM | POA: Insufficient documentation

## 2012-04-27 ENCOUNTER — Other Ambulatory Visit (HOSPITAL_COMMUNITY): Payer: Self-pay | Admitting: Internal Medicine

## 2012-04-27 DIAGNOSIS — N644 Mastodynia: Secondary | ICD-10-CM

## 2012-04-28 ENCOUNTER — Encounter: Payer: Self-pay | Admitting: Gastroenterology

## 2012-04-28 ENCOUNTER — Ambulatory Visit: Payer: Medicaid Other | Admitting: Gastroenterology

## 2012-04-28 ENCOUNTER — Ambulatory Visit (INDEPENDENT_AMBULATORY_CARE_PROVIDER_SITE_OTHER): Payer: Medicaid Other | Admitting: Gastroenterology

## 2012-04-28 DIAGNOSIS — R1319 Other dysphagia: Secondary | ICD-10-CM

## 2012-04-28 DIAGNOSIS — R0789 Other chest pain: Secondary | ICD-10-CM

## 2012-04-28 DIAGNOSIS — R1310 Dysphagia, unspecified: Secondary | ICD-10-CM

## 2012-04-28 MED ORDER — BENZONATATE 100 MG PO CAPS: ORAL_CAPSULE | ORAL | Status: DC

## 2012-04-28 NOTE — Assessment & Plan Note (Addendum)
CONTINUES BUT GERD UNCONTROLLED-LIFE STYLE CHOICES CONTRIBUTING TO FLARES.  SWALLOWING EVAL. STOP SMOKING. AVOID SODA. CUT DOWN ON COFFEE. CONTINUE PROTONIX. OPV IN 4 MOS.

## 2012-04-28 NOTE — Assessment & Plan Note (Signed)
LEFT BREAST PAIN, MOST LIKELY DUE TO A CHEST WALL STRAIN FROM COUGHING:   1. TAKE TESSALON PERLES THREE TIMES A DAY FOR 5 DAYS.   2. CONTINUE TO CUT DOWN ON SMOKING.   3. USE ROBITUSSIN DM AS NEEDED FOR COUGH.   4. CALL HEALTH DEPT FOR AN APPT NEXT WEEK.  FOLLOW UP IN  4 MOS.

## 2012-04-28 NOTE — Patient Instructions (Signed)
COMPLETE YOUR SWALLOWING STUDY.  FOR LEFT BREAST PAIN, WHICH MOST LIKELY DUE TO A CHEST WALL STRAIN FROM COUGHING:   1. TAKE TESSALON PERLES THREE TIMES A DAY FOR 5 DAYS.   2. CONTINUE TO CUT DOWN ON SMOKING.   3. USE ROBITUSSIN DM AS NEEDED FOR COUGH.   4. CALL HEALTH DEPT FOR AN APPT NEXT WEEK.  FOLLOW UP IN  4 MOS.

## 2012-04-28 NOTE — Progress Notes (Signed)
Faxed to PCP

## 2012-04-28 NOTE — Progress Notes (Signed)
Subjective:    Patient ID: Jacqueline [name], female    DOB: [birth year], 61 y.o.   MRN: [id number]  PCP: none-felt her last PCP VIOLATED HIPAA.  HPI EGD/DIL OCT 2012 AND FEB 2013 TO 17 MM. NO PROBLEMS SWALLOWING UNTIL THE LAST COUPLE OF WEEKS. HEARTBURN: MORE THAN 2X/WEEK. SMOKING: 1/2 PK/DAY. DRINKS CARBONATED BEVERAGES-MT DEW OR COKE(2L AND HAS A COUPLE GLASSES A DAY. FRIED FOOD: NO. BAKES HER FRIES. COFFEE: 1 POT/DAY. HAVING PROBLEM SWALLOWING SOLIDS AND LIQUIDS.   COUGHING AND NOW HAS LEFT BREAST PAIN. WANTS ME TO TAKE A LOOK AT HER BREAST.  Past Medical History  Diagnosis Date  . Gastric nodule 2009    EUS, ?leiomyoma  . Irritable bowel syndrome   . History of pancreatitis     Biliary and/or etoh?  Marland Kitchen Anxiety   . Chronic back pain   . GERD (gastroesophageal reflux disease)   . Migraines   . 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 Peripheral neuropathy     Past Surgical History  Procedure Date  . Back surgery /ray cage fusion comberg, gso   . Toe graft   . Cesarean section   . 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  . Tonsillectomy   . Abdominal hysterectomy   . Dilation and curettage of uterus   . Gallbladder surgery 2010  . Eus 12/2008    Dr. Dulce Sellar. Gastric nodule most consistent with Leiomyoma. Slightly thickened and irregular gallbladder wall, possibly due to sludge or diminutive stones. Recommend EUS in January 2011 to followup gastric nodule.  . Esophagogastroduodenoscopy 11/2008    A 9-mm submucosal lesion seen in the cardia., gastritis, no hpylori  . Colonoscopy 2001    Dr. Karilyn Cota, hemorrhoids  . Cholecystectomy   . Colonoscopy 10/21/2011    Procedure: COLONOSCOPY;  Surgeon: Arlyce Harman, MD;  Location: [facility];  Service: Endoscopy;  Laterality: N/A;  8:30  . Savory dilation 02/03/2012    Procedure: SAVORY DILATION;  Surgeon: Arlyce Harman, MD;  Location: [facility];  Service: Endoscopy;  Laterality: N/A;  dilated with savory  # 15,16,17  . Back surgery   . Abdominal surgery    Allergies  Allergen Reactions  . Aspirin Other (See Comments)    Has had part of stomach removed, cannot take aspirin   . Imitrex (Sumatriptan Base) Other (See Comments)    Fast heart rate  . Ketorolac Tromethamine Other (See Comments)    drawning up in hands   . Ketorolac Tromethamine Other (See Comments)    "makes my hands draw up"  . Nsaids Other (See Comments)    Due to stomach issues (has had part of stomach removed, cannot take NSAIDS OR ASA)  . Promethazine Hcl Other (See Comments)    Legs jerking -" like restless legs"  . Sulfonamide Derivatives Nausea And Vomiting    Lost consciousness   . Sumatriptan Palpitations    Current Outpatient Prescriptions  Medication Sig Dispense Refill  . gabapentin (NEURONTIN) 600 MG tablet Take 600 mg by mouth 3 (three) times daily.       . methocarbamol (ROBAXIN) 500 MG tablet Take 500 mg by mouth 4 (four) times daily.      . pantoprazole (PROTONIX) 40  MG tablet Take 40 mg by mouth 2 (two) times daily.      . traMADol (ULTRAM) 50 MG tablet Take 50 mg by mouth every 6 (six) hours as needed. For back pain      .           Review of Systems     Objective:   Physical Exam  Vitals reviewed. Constitutional: She is oriented to person, place, and time. She appears well-developed. No distress.  HENT:  Head: Normocephalic and atraumatic.  Mouth/Throat: Oropharynx is clear and moist. No oropharyngeal exudate.  Eyes: Pupils are equal, round, and reactive to light. No scleral icterus.  Neck: Normal range of motion. Neck supple.  Cardiovascular: Normal rate, regular rhythm and normal heart sounds.   Pulmonary/Chest: Effort normal and breath sounds normal. No respiratory  distress. She exhibits tenderness (NEAR LEFT BREAST AREOLA, NO REDNESS, SWELLING, OR MASS,).  Abdominal: Soft. Bowel sounds are normal. She exhibits no distension. There is tenderness (MILD TTP IN BUQs). There is no rebound and no guarding.  Musculoskeletal: Normal range of motion. She exhibits no edema.  Lymphadenopathy:    She has no cervical adenopathy.  Neurological: She is alert and oriented to person, place, and time.       NO FOCAL DEFICITS   Psychiatric:       FLAT AFFECT, NL MOOD          Assessment & Plan:

## 2012-04-29 ENCOUNTER — Ambulatory Visit (HOSPITAL_COMMUNITY): Payer: Medicaid Other

## 2012-04-29 NOTE — Progress Notes (Signed)
Reminder in epic to follow up in 4 months °

## 2012-05-02 ENCOUNTER — Emergency Department (HOSPITAL_COMMUNITY)
Admission: EM | Admit: 2012-05-02 | Discharge: 2012-05-02 | Disposition: A | Discharge status: Home / Self Care | Payer: Medicaid Other | Attending: Emergency Medicine | Admitting: Emergency Medicine

## 2012-05-02 ENCOUNTER — Encounter (HOSPITAL_COMMUNITY): Payer: Self-pay | Admitting: *Deleted

## 2012-05-02 ENCOUNTER — Emergency Department (HOSPITAL_COMMUNITY): Payer: Medicaid Other

## 2012-05-02 DIAGNOSIS — G609 Hereditary and idiopathic neuropathy, unspecified: Secondary | ICD-10-CM | POA: Insufficient documentation

## 2012-05-02 DIAGNOSIS — F17200 Nicotine dependence, unspecified, uncomplicated: Secondary | ICD-10-CM | POA: Insufficient documentation

## 2012-05-02 DIAGNOSIS — K219 Gastro-esophageal reflux disease without esophagitis: Secondary | ICD-10-CM | POA: Insufficient documentation

## 2012-05-02 DIAGNOSIS — K589 Irritable bowel syndrome without diarrhea: Secondary | ICD-10-CM | POA: Insufficient documentation

## 2012-05-02 DIAGNOSIS — J441 Chronic obstructive pulmonary disease with (acute) exacerbation: Secondary | ICD-10-CM | POA: Insufficient documentation

## 2012-05-02 DIAGNOSIS — R079 Chest pain, unspecified: Secondary | ICD-10-CM | POA: Insufficient documentation

## 2012-05-02 DIAGNOSIS — G43909 Migraine, unspecified, not intractable, without status migrainosus: Secondary | ICD-10-CM | POA: Insufficient documentation

## 2012-05-02 DIAGNOSIS — F411 Generalized anxiety disorder: Secondary | ICD-10-CM | POA: Insufficient documentation

## 2012-05-02 LAB — DIFFERENTIAL
Basophils Relative: 0 % (ref 0–1)
Eosinophils Absolute: 0.1 10*3/uL (ref 0.0–0.7)
Eosinophils Relative: 1 % (ref 0–5)
Lymphs Abs: 2.7 10*3/uL (ref 0.7–4.0)
Monocytes Absolute: 0.4 10*3/uL (ref 0.1–1.0)
Monocytes Relative: 6 % (ref 3–12)
Neutrophils Relative %: 56 % (ref 43–77)

## 2012-05-02 LAB — URINE MICROSCOPIC-ADD ON

## 2012-05-02 LAB — COMPREHENSIVE METABOLIC PANEL
Albumin: 3.8 g/dL (ref 3.5–5.2)
Alkaline Phosphatase: 86 U/L (ref 39–117)
BUN: 6 mg/dL (ref 6–23)
Creatinine, Ser: 0.66 mg/dL (ref 0.50–1.10)
GFR calc Af Amer: >90 mL/min (ref >90)
Glucose, Bld: 91 mg/dL (ref 70–99)
Total Protein: 7 g/dL (ref 6.0–8.3)

## 2012-05-02 LAB — URINALYSIS, ROUTINE W REFLEX MICROSCOPIC
Glucose, UA: NEGATIVE mg/dL
Ketones, ur: NEGATIVE mg/dL
Leukocytes, UA: NEGATIVE
Nitrite: NEGATIVE
Protein, ur: NEGATIVE mg/dL
pH: 7.5 (ref 5.0–8.0)

## 2012-05-02 LAB — POCT I-STAT TROPONIN I: Troponin i, poc: 0.05 ng/mL (ref 0.00–0.08)

## 2012-05-02 LAB — CBC
HCT: 40 % (ref 36.0–46.0)
Hemoglobin: 13.4 g/dL (ref 12.0–15.0)
MCH: 31.4 pg (ref 26.0–34.0)
MCHC: 33.5 g/dL (ref 30.0–36.0)
MCV: 93.7 fL (ref 78.0–100.0)
RBC: 4.27 MIL/uL (ref 3.87–5.11)

## 2012-05-02 LAB — PROTIME-INR: Prothrombin Time: 12.5 seconds (ref 11.6–15.2)

## 2012-05-02 MED ORDER — HYDROCODONE-ACETAMINOPHEN 5-325 MG PO TABS
1.0000 | ORAL_TABLET | Freq: Once | ORAL | Status: AC
  Administered 2012-05-02: 1 via ORAL
  Filled 2012-05-02: qty 1

## 2012-05-02 MED ORDER — HYDROCODONE-ACETAMINOPHEN 5-325 MG PO TABS: 1.0000 | ORAL_TABLET | ORAL | Status: AC | PRN

## 2012-05-02 MED ORDER — AZITHROMYCIN 250 MG PO TABS
500.0000 mg | ORAL_TABLET | Freq: Once | ORAL | Status: AC
  Administered 2012-05-02: 500 mg via ORAL
  Filled 2012-05-02: qty 2

## 2012-05-02 MED ORDER — HYDROMORPHONE HCL PF 1 MG/ML IJ SOLN
1.0000 mg | Freq: Once | INTRAMUSCULAR | Status: AC
  Administered 2012-05-02: 1 mg via INTRAVENOUS
  Filled 2012-05-02: qty 1

## 2012-05-02 MED ORDER — ONDANSETRON HCL 4 MG/2ML IJ SOLN
4.0000 mg | Freq: Once | INTRAMUSCULAR | Status: AC
  Administered 2012-05-02: 4 mg via INTRAVENOUS
  Filled 2012-05-02: qty 2

## 2012-05-02 MED ORDER — AZITHROMYCIN 250 MG PO TABS: ORAL_TABLET | ORAL | Status: DC

## 2012-05-02 MED ORDER — SODIUM CHLORIDE 0.9 % IV SOLN
INTRAVENOUS | Status: DC
  Administered 2012-05-02: 19:00:00 via INTRAVENOUS

## 2012-05-02 NOTE — ED Provider Notes (Signed)
History   This chart was scribed for Jacqueline Human, MD by [name]. The patient was seen in room [facility]. Patient's care was started at 1808.   CSN: [id number]  Arrival date & time 05/02/12  8657   First MD Initiated Contact with Patient 05/02/12 1825      Chief Complaint  Patient presents with  . Chest Pain   HPI  Jacqueline [name] is a 61 y.o. female who presents to the Emergency Department complaining of gradual severe intermittent medial chest pain radiating towards the left chest, under the left arm, and to the back onset 24 hours ago described as a hot-grabbing-burning pain ranked by pt at 8/10, which is aggravated by deep breathing. Pt has taken Tramadol w/ no relief and denies dysuria and irritable bowels. Pt list allergies to ASA, Imitrex, Ketorolac, Tromethamine, Nsaids, Promethazine, Sulfonamide derivatives, and Sumatriptan, and a h/o pancreatitis diagnosed 3 years ago, GERD, gastric tumors, partial gastrectomy, hysterectomy, and irritable bowel syndrome. Pt denies the use of alcohol, but admits that to being a half pack a day smoker.   Past Medical History  Diagnosis Date  . Gastric nodule 2009    EUS, ?leiomyoma  . Irritable bowel syndrome   . History of pancreatitis     Biliary and/or etoh?  Marland Kitchen Anxiety   . Chronic back pain   . GERD (gastroesophageal reflux disease)   . Migraines   . 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 Peripheral neuropathy    Past Surgical History  Procedure Date  . Back surgery /ray cage fusion comberg, gso   . Toe graft   . Cesarean section   . 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  . Tonsillectomy   . Abdominal hysterectomy   . Dilation and curettage of uterus   . Gallbladder  surgery 2010  . Eus 12/2008    Dr. Dulce Sellar. Gastric nodule most consistent with Leiomyoma. Slightly thickened and irregular gallbladder wall, possibly due to sludge or diminutive stones. Recommend EUS in January 2011 to followup gastric nodule.  . Esophagogastroduodenoscopy 11/2008    A 9-mm submucosal lesion seen in the cardia., gastritis, no hpylori  . Colonoscopy 2001    Dr. Karilyn Cota, hemorrhoids  . Cholecystectomy   . Colonoscopy 10/21/2011    Procedure: COLONOSCOPY;  Surgeon: Arlyce Harman, MD;  Location: [facility];  Service: Endoscopy;  Laterality: N/A;  8:30  . Savory dilation 02/03/2012    Procedure: SAVORY DILATION;  Surgeon: Arlyce Harman, MD;  Location: [facility];  Service: Endoscopy;  Laterality: N/A;  dilated with savory  # 15,16,17  . Back surgery   . Abdominal surgery    Family History  Problem Relation Age of Onset  . Colon cancer Mother     diagnosed in late 30s and died age 86  . Heart defect      family history   . Arthritis      family history  . COPD      family history   . Cancer      multiple unknown type  . Heart attack Father     deceased at 59  . Lung cancer Maternal Uncle   . Throat cancer Maternal Uncle   .  Colon cancer Paternal Uncle   . Colon cancer Maternal Aunt   . Colon cancer Other   . Anesthesia problems Neg Hx   . Hypotension Neg Hx   . Malignant hyperthermia Neg Hx   . Pseudochol deficiency Neg Hx    History  Substance Use Topics  . Smoking status: Current Everyday Smoker -- 0.7 packs/day for 35 years    Types: Cigarettes  . Smokeless tobacco: Never Used  . Alcohol Use: Yes     occasional holiday drink   Review of Systems  Constitutional: Negative for fever.  HENT: Negative for rhinorrhea.   Eyes: Negative for pain.  Respiratory: Negative for cough and shortness of breath.   Cardiovascular: Positive for chest pain.  Gastrointestinal: Negative for nausea, vomiting, abdominal pain and diarrhea.  Genitourinary: Negative for  dysuria.  Musculoskeletal: Positive for back pain.  Skin: Negative for rash.  Neurological: Negative for weakness and headaches.    Allergies  Aspirin; Imitrex; Ketorolac tromethamine; Ketorolac tromethamine; Nsaids; Promethazine hcl; Sulfonamide derivatives; and Sumatriptan  Home Medications   Current Outpatient Rx  Name Route Sig Dispense Refill  . GABAPENTIN 600 MG PO TABS Oral Take 600 mg by mouth 3 (three) times daily.     Marland Kitchen ONDANSETRON HCL 4 MG PO TABS Oral Take 4 mg by mouth every 6 (six) hours as needed. nausea    . PANTOPRAZOLE SODIUM 40 MG PO TBEC Oral Take 40 mg by mouth 2 (two) times daily.      BP 132/82  Pulse 54  Temp(Src) 98.7 F (37.1 C) (Oral)  Resp 18  Ht 5\' 2"  (1.575 m)  Wt 125 lb (56.7 kg)  BMI 22.86 kg/m2  SpO2 99%  Physical Exam  Nursing note and vitals reviewed. Constitutional: She is oriented to person, place, and time. She appears well-developed and well-nourished. No distress.  HENT:  Head: Normocephalic and atraumatic.  Right Ear: External ear normal.  Left Ear: External ear normal.  Eyes: EOM are normal. Pupils are equal, round, and reactive to light. No scleral icterus.  Neck: Neck supple. No tracheal deviation present.  Cardiovascular: Normal rate, regular rhythm, normal heart sounds and intact distal pulses.  Exam reveals no gallop and no friction rub.   No murmur heard. Pulmonary/Chest: Effort normal and breath sounds normal. No respiratory distress. She has no wheezes. She has no rales.  Abdominal: Soft. She exhibits no distension.  Musculoskeletal: Normal range of motion. She exhibits no edema.  Neurological: She is alert and oriented to person, place, and time. No cranial nerve deficit or sensory deficit. Coordination normal.  Skin: Skin is warm and dry. No erythema.  Psychiatric: She has a normal mood and affect. Her behavior is normal.    ED Course  Procedures (including critical care time)  DIAGNOSTIC STUDIES: Oxygen Saturation  is 100% on room air, normal by my interpretation.    COORDINATION OF CARE: 6:20pm - Discussed status of current illness and administered breast exam.  8:59 PM  Date: 05/02/2012  Rate:63  Rhythm: normal sinus rhythm  QRS Axis: normal  Intervals: normal  ST/T Wave abnormalities: nonspecific T wave changes  Conduction Disutrbances:none  Narrative Interpretation: Abnormal EKG  Old EKG Reviewed: changes noted--Inverted T waves in V1-V3 are more prominent than on tracing of 12/18/2011. 9:13pm - reviewed test results and blood work, urine analysis, and chest x-rays showing no negative results. Also reviewwed No diagnosis found.  Results for orders placed during the hospital encounter of 05/02/12  D-DIMER, QUANTITATIVE  Component Value Range   D-Dimer, Quant <0.22  0.00 - 0.48 (ug/mL-FEU)  APTT      Component Value Range   aPTT 34  24 - 37 (seconds)  PROTIME-INR      Component Value Range   Prothrombin Time 12.5  11.6 - 15.2 (seconds)   INR 0.91  0.00 - 1.49   CBC      Component Value Range   WBC 7.3  4.0 - 10.5 (K/uL)   RBC 4.27  3.87 - 5.11 (MIL/uL)   Hemoglobin 13.4  12.0 - 15.0 (g/dL)   HCT 54.0  98.1 - 19.1 (%)   MCV 93.7  78.0 - 100.0 (fL)   MCH 31.4  26.0 - 34.0 (pg)   MCHC 33.5  30.0 - 36.0 (g/dL)   RDW 47.8  29.5 - 62.1 (%)   Platelets 252  150 - 400 (K/uL)  DIFFERENTIAL      Component Value Range   Neutrophils Relative 56  43 - 77 (%)   Neutro Abs 4.1  1.7 - 7.7 (K/uL)   Lymphocytes Relative 37  12 - 46 (%)   Lymphs Abs 2.7  0.7 - 4.0 (K/uL)   Monocytes Relative 6  3 - 12 (%)   Monocytes Absolute 0.4  0.1 - 1.0 (K/uL)   Eosinophils Relative 1  0 - 5 (%)   Eosinophils Absolute 0.1  0.0 - 0.7 (K/uL)   Basophils Relative 0  0 - 1 (%)   Basophils Absolute 0.0  0.0 - 0.1 (K/uL)  COMPREHENSIVE METABOLIC PANEL      Component Value Range   Sodium 141  135 - 145 (mEq/L)   Potassium 3.7  3.5 - 5.1 (mEq/L)   Chloride 103  96 - 112 (mEq/L)   CO2 30  19 - 32 (mEq/L)     Glucose, Bld 91  70 - 99 (mg/dL)   BUN 6  6 - 23 (mg/dL)   Creatinine, Ser 3.08  0.50 - 1.10 (mg/dL)   Calcium 9.7  8.4 - 65.7 (mg/dL)   Total Protein 7.0  6.0 - 8.3 (g/dL)   Albumin 3.8  3.5 - 5.2 (g/dL)   AST 19  0 - 37 (U/L)   ALT 8  0 - 35 (U/L)   Alkaline Phosphatase 86  39 - 117 (U/L)   Total Bilirubin 0.1 (*) 0.3 - 1.2 (mg/dL)   GFR calc non Af Amer >90  >90 (mL/min)   GFR calc Af Amer >90  >90 (mL/min)  URINALYSIS, ROUTINE W REFLEX MICROSCOPIC      Component Value Range   Color, Urine YELLOW  YELLOW    APPearance CLEAR  CLEAR    Specific Gravity, Urine 1.010  1.005 - 1.030    pH 7.5  5.0 - 8.0    Glucose, UA NEGATIVE  NEGATIVE (mg/dL)   Hgb urine dipstick TRACE (*) NEGATIVE    Bilirubin Urine NEGATIVE  NEGATIVE    Ketones, ur NEGATIVE  NEGATIVE (mg/dL)   Protein, ur NEGATIVE  NEGATIVE (mg/dL)   Urobilinogen, UA 0.2  0.0 - 1.0 (mg/dL)   Nitrite NEGATIVE  NEGATIVE    Leukocytes, UA NEGATIVE  NEGATIVE   POCT I-STAT TROPONIN I      Component Value Range   Troponin i, poc 0.05  0.00 - 0.08 (ng/mL)   Comment 3           URINE MICROSCOPIC-ADD ON      Component Value Range   Squamous Epithelial / LPF RARE  RARE    WBC, UA 0-2  <3 (WBC/hpf)   RBC / HPF 0-2  <3 (RBC/hpf)   Bacteria, UA RARE  RARE    9:28 PM Lab workup is reassuringly good.  She appears to be having a COPD exacerbation.  Advised smoking cessation, Rx Z-pak for bronchitis and vicodin for pain, be sure to get the mammogram she has scheduled in 3 days.  IMP:  COPD exacerbation   I personally performed the services described in this documentation, which was scribed in my presence. The recorded information has been reviewed and considered.  Jacqueline [name], M.D.    Carleene Cooper III, MD 05/02/12 2129

## 2012-05-02 NOTE — Discharge Instructions (Signed)

## 2012-05-02 NOTE — ED Notes (Signed)
Pt has worsening pain under left breast and arm with palpation and movement. Cardiac monitor showing NSR.

## 2012-05-02 NOTE — ED Notes (Signed)
Pt c/o pain in the left side of her chest radiating under her left arm for several months. Pt states that she had pain in the center of her chest last night and is gradually getting worse. C/o nausea and sweating since yesterday. Denies fever or cough.

## 2012-05-04 LAB — URINE CULTURE
Colony Count: 5000
Culture  Setup Time: 201305121945

## 2012-05-05 ENCOUNTER — Ambulatory Visit (HOSPITAL_COMMUNITY): Admission: RE | Admit: 2012-05-05 | Payer: Medicaid Other | Source: Ambulatory Visit

## 2012-05-12 ENCOUNTER — Ambulatory Visit (HOSPITAL_COMMUNITY): Admission: RE | Admit: 2012-05-12 | Payer: Medicaid Other | Source: Ambulatory Visit

## 2012-05-19 ENCOUNTER — Ambulatory Visit (HOSPITAL_COMMUNITY)
Admission: RE | Admit: 2012-05-19 | Discharge: 2012-05-19 | Disposition: A | Discharge status: Home / Self Care | Payer: Medicaid Other | Source: Ambulatory Visit | Attending: Internal Medicine | Admitting: Internal Medicine

## 2012-05-19 ENCOUNTER — Other Ambulatory Visit (HOSPITAL_COMMUNITY): Payer: Self-pay | Admitting: Internal Medicine

## 2012-05-19 DIAGNOSIS — N644 Mastodynia: Secondary | ICD-10-CM

## 2012-06-06 ENCOUNTER — Emergency Department (HOSPITAL_COMMUNITY)
Admission: EM | Admit: 2012-06-06 | Discharge: 2012-06-06 | Disposition: A | Discharge status: Home / Self Care | Payer: Medicaid Other | Attending: Emergency Medicine | Admitting: Emergency Medicine

## 2012-06-06 ENCOUNTER — Encounter (HOSPITAL_COMMUNITY): Payer: Self-pay | Admitting: Emergency Medicine

## 2012-06-06 DIAGNOSIS — F17200 Nicotine dependence, unspecified, uncomplicated: Secondary | ICD-10-CM | POA: Insufficient documentation

## 2012-06-06 DIAGNOSIS — Z9889 Other specified postprocedural states: Secondary | ICD-10-CM | POA: Insufficient documentation

## 2012-06-06 DIAGNOSIS — K219 Gastro-esophageal reflux disease without esophagitis: Secondary | ICD-10-CM | POA: Insufficient documentation

## 2012-06-06 DIAGNOSIS — M545 Low back pain, unspecified: Secondary | ICD-10-CM | POA: Insufficient documentation

## 2012-06-06 DIAGNOSIS — F411 Generalized anxiety disorder: Secondary | ICD-10-CM | POA: Insufficient documentation

## 2012-06-06 DIAGNOSIS — Z79899 Other long term (current) drug therapy: Secondary | ICD-10-CM | POA: Insufficient documentation

## 2012-06-06 MED ORDER — HYDROCODONE-ACETAMINOPHEN 5-325 MG PO TABS
2.0000 | ORAL_TABLET | Freq: Once | ORAL | Status: AC
  Administered 2012-06-06: 2 via ORAL
  Filled 2012-06-06: qty 2

## 2012-06-06 MED ORDER — HYDROCODONE-ACETAMINOPHEN 5-325 MG PO TABS: ORAL_TABLET | ORAL | Status: AC

## 2012-06-06 NOTE — ED Provider Notes (Signed)
History     CSN: [id number]  Arrival date & time 06/06/12  1327   First MD Initiated Contact with Patient 06/06/12 1420      Chief Complaint  Patient presents with  . Back Pain    HPI Pt was seen at 1430.  Per pt, c/o gradual onset and persistence of constant acute flair of her chronic low back "pain" for the past several days, worse since yesterday.  States she has been caring for a young grandchild for the past several days and "being on the floor a lot."  States yesterday she "jumped" after being stung by a bee and "might have twisted the wrong way."  Pain worsens with palpation of the area and body position changes.  Denies any change in her usual chronic pain pattern.  Denies incont/retention of bowel or bladder, no saddle anesthesia, no focal motor weakness, no tingling/numbness in extremities, no fevers, no injury.   The symptoms have been associated with no other complaints. The patient has a significant history of similar symptoms previously, recently being evaluated for this complaint and multiple prior evals for same.      Past Medical History  Diagnosis Date  . Gastric nodule 2009    EUS, ?leiomyoma  . Irritable bowel syndrome   . History of pancreatitis     Biliary and/or etoh?  Marland Kitchen Anxiety   . Chronic back pain   . GERD (gastroesophageal reflux disease)   . Migraines   . 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 Peripheral neuropathy     Past Surgical History  Procedure Date  . Back surgery /ray cage fusion comberg, gso   . Toe graft   . Cesarean section   . 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  . Tonsillectomy   . Abdominal hysterectomy   . Dilation and curettage of uterus   . Gallbladder surgery 2010  . Eus  12/2008    Dr. Dulce Sellar. Gastric nodule most consistent with Leiomyoma. Slightly thickened and irregular gallbladder wall, possibly due to sludge or diminutive stones. Recommend EUS in January 2011 to followup gastric nodule.  . Esophagogastroduodenoscopy 11/2008    A 9-mm submucosal lesion seen in the cardia., gastritis, no hpylori  . Colonoscopy 2001    Dr. Karilyn Cota, hemorrhoids  . Cholecystectomy   . Colonoscopy 10/21/2011    Procedure: COLONOSCOPY;  Surgeon: Arlyce Harman, MD;  [facility]: [facility];  Service: Endoscopy;  Laterality: N/A;  8:30  . Savory dilation 02/03/2012    Procedure: SAVORY DILATION;  Surgeon: Arlyce Harman, MD;  Location: [facility];  Service: Endoscopy;  Laterality: N/A;  dilated with savory  # 15,16,17  . Back surgery   . Abdominal surgery     Family History  Problem Relation Age of Onset  . Colon cancer Mother     diagnosed in late 31s and died age 79  . Heart failure Mother   . Heart defect      family history   . Arthritis      family history  . COPD      family history   . Cancer      multiple unknown type  . Heart attack Father     deceased at  86  . Hypertension Father   . Heart failure Father   . Lung cancer Maternal Uncle   . Throat cancer Maternal Uncle   . Colon cancer Paternal Uncle   . Colon cancer Maternal Aunt   . Colon cancer Other   . Anesthesia problems Neg Hx   . Hypotension Neg Hx   . Malignant hyperthermia Neg Hx   . Pseudochol deficiency Neg Hx     History  Substance Use Topics  . Smoking status: Current Everyday Smoker -- 0.5 packs/day for 35 years    Types: Cigarettes  . Smokeless tobacco: Never Used  . Alcohol Use: Yes     occasional holiday drink    OB History    Grav Para Term Preterm Abortions TAB SAB Ect Mult Living   3 2 2  1  1   2       Review of Systems ROS: Statement: All systems negative except as marked or noted in the HPI; Constitutional: Negative for fever and chills. ; ; Eyes: Negative for eye pain,  redness and discharge. ; ; ENMT: Negative for ear pain, hoarseness, nasal congestion, sinus pressure and sore throat. ; ; Cardiovascular: Negative for chest pain, palpitations, diaphoresis, dyspnea and peripheral edema. ; ; Respiratory: Negative for cough, wheezing and stridor. ; ; Gastrointestinal: Negative for nausea, vomiting, diarrhea, abdominal pain, blood in stool, hematemesis, jaundice and rectal bleeding. . ; ; Genitourinary: Negative for dysuria, flank pain and hematuria. ; ; Musculoskeletal: +LBP.  Negative for neck pain. Negative for swelling and trauma.; ; Skin: Negative for pruritus, rash, abrasions, blisters, bruising and skin lesion.; ; Neuro: Negative for headache, lightheadedness and neck stiffness. Negative for weakness, altered level of consciousness , altered mental status, extremity weakness, paresthesias, involuntary movement, seizure and syncope.     Allergies  Aspirin; Imitrex; Ketorolac tromethamine; Ketorolac tromethamine; Nsaids; Promethazine hcl; Sulfonamide derivatives; and Sumatriptan  Home Medications   Current Outpatient Rx  Name Route Sig Dispense Refill  . GABAPENTIN 600 MG PO TABS Oral Take 600 mg by mouth 3 (three) times daily.     Marland Kitchen METHOCARBAMOL 500 MG PO TABS Oral Take 500 mg by mouth 4 (four) times daily.    Marland Kitchen ONDANSETRON HCL 4 MG PO TABS Oral Take 4 mg by mouth every 6 (six) hours as needed. nausea    . PANTOPRAZOLE SODIUM 40 MG PO TBEC Oral Take 40 mg by mouth 2 (two) times daily.    . TRAMADOL HCL 50 MG PO TABS Oral Take 100 mg by mouth every 4 (four) hours as needed. Pain      BP 121/87  Pulse 81  Temp 98.2 F (36.8 C) (Oral)  Resp 18  Ht 5\' 2"  (1.575 m)  Wt 123 lb (55.792 kg)  BMI 22.50 kg/m2  SpO2 98%  Physical Exam 1435: Physical examination:  Nursing notes reviewed; Vital signs and O2 SAT reviewed;  Constitutional: Well developed, Well nourished, Well hydrated, In no acute distress; Head:  Normocephalic, atraumatic; Eyes: EOMI, PERRL, No  scleral icterus; ENMT: Mouth and pharynx normal, Mucous membranes moist; Neck: Supple, Full range of motion, No lymphadenopathy; Cardiovascular: Regular rate and rhythm, No murmur, rub, or gallop; Respiratory: Breath sounds clear & equal bilaterally, No rales, rhonchi, wheezes.  Speaking full sentences with ease, Normal respiratory effort/excursion; Chest: Nontender, Movement normal; Abdomen: Soft, Nontender, Nondistended, Normal bowel sounds; Genitourinary: No CVA tenderness; Spine:  No midline CS, TS, LS tenderness.  +TTP left lumbar paraspinal muscles.; Extremities: Pulses normal, No tenderness, No  edema, No calf edema or asymmetry.; Neuro: AA&Ox3, Major CN grossly intact.  Speech clear. Strength 5/5 equal bilat UE's and LE's, including great toe dorsiflexion.  DTR 2/4 equal bilat UE's and LE's.  No gross sensory deficits.  Neg straight leg raises bilat.; Skin: Color normal, Warm, Dry.   ED Course  Procedures    MDM  MDM Reviewed: previous chart, nursing note and vitals      3:15 PM:  States she as robaxin at home and does not want another muscle relaxant, requesting pain medication "stronger than ultram."  Long hx of chronic pain with multiple ED visits for same.  Pt endorses acute flair of her usual long standing chronic pain today, no change from her usual chronic pain pattern.  Pt encouraged to f/u with her PMD, Neurosurgeon or Pain Management doctor for good continuity of care and control of her chronic pain.  Verb understanding.         Laray Anger, DO 06/07/12 1710

## 2012-06-06 NOTE — Discharge Instructions (Signed)
RESOURCE GUIDE  Chronic Pain Problems: Contact [facility]  [phone] Patients need to be referred by their primary care doctor.  Insufficient Money for Medicine: Contact [employer] Way:  call "211" or [facility] [phone].  No Primary Care Doctor: - Call [facility]  [phone] - can help you locate a primary care doctor that  accepts your insurance, provides certain services, etc. - Physician Referral Service- 1-800-533-3463  Agencies that provide inexpensive medical care: - [facility]  [phone] - [facility]  [phone] - [facility]  [phone] - [facility] Clinic  [phone] - Planned Parenthood  [phone] - [facility]  [phone]  Medicaid-accepting [place] Providers: - [facility]- [address]  [phone], Mon-Fri 9am-7pm, Sat 9am-1pm - [facility]- [address], [address]  [phone] - [facility]- [address], [address]  [phone] - [facility]- [address] [place] [address]  [phone] - Veita Bland- [address], [address], [phone]  Only accepts [place] Access Medicaid patients after they have their name  applied to their card  Self Pay (no insurance) in [place]: - Sickle Cell Patients: Dr Eric Dean, [facility]  [address], [phone] - [facility]- [address]  [phone]       -     [facility]- [address]       -     [facility]- see information above (Speak to Pam H if you do not have insurance)       -  [facility]- [address], [phone]       -  [facility] [place]- [address],  [phone]       -  [facility]- [address] [place] [address], [phone]       -  Dr Osei-Bonsu-  [address], [place], [phone]       -  [facility]- [address], [phone]       -  [facility]- [address] [place] [address], [phone], also [address], [phone]       -    [facility]- [address], [phone], 1st & 3rd Saturday   every month, 10am-1pm  1) Find a Doctor and Pay Out of Pocket Although you won't have to find out who is covered by your insurance plan, it is a good idea to ask around and get recommendations. You will then need to call the office and see if the doctor you have chosen will accept you as a new patient and what types of options they offer for patients who are self-pay. Some doctors offer discounts or will set up payment plans for their patients who do not have insurance, but you will need to ask so you aren't surprised when you get to your appointment.  2) Contact Your Local Health Department Not all health departments have doctors that can see patients for sick visits, but many do, so it is worth a call to see if yours does. If you don't know where your local health department is, you can check in your phone book. The CDC also has a tool to help you locate your state's health department, and many state websites also have   listings of all of their local health departments.  3) Find a Walk-in Clinic If your illness is not likely to be very severe or complicated, you may want to try a walk in clinic. These are popping up all over the country in pharmacies, drugstores, and shopping centers. They're usually staffed by nurse practitioners or physician assistants that have been trained to treat common illnesses and complaints. They're usually fairly quick and inexpensive. However, if you have serious medical issues or chronic medical problems, these are probably not your best option  STD Testing - [place] [facility] [place], [facility], [address], [place], phone [phone] or 1-877-539-9860.  Monday - Friday, call for an appointment. - [place]  [facility] [place], [facility] Clinic, [address], [place], phone [phone] or 1-877-539-9860.  Monday - Friday, call for an appointment.  Abuse/Neglect: - [place] Child Abuse Hotline (336) 641-3795 - [place] Child Abuse Hotline 800-378-5315 (After Hours)  Emergency Shelter:  [facility] (336) 271-5985  Maternity Homes: - [facility] (336) 275-9566 - [facility] (704) 372-4663  MRSA Hotline #:   832-7006  [place] Resources  [facility] [place]  [employer] [place] [facility] [address]                 [address]         [address]  [place]                                               [place]                              [place] Phone:  [phone]                                  Phone:  [phone]                   Phone:  [phone]  [place] [facility], [phone] - [place] Services - [facility]- 1-888-581-9988       -     [facility] in [place], [address],                                  336-349-4454, Insurance  [place] Child Abuse Hotline (336) 342-1394 or (336) 342-3537 (After Hours)   Behavioral Health Services  Substance Abuse Resources: - [facility]  336-882-2125 - [facility] 336-784-9470 - [facility] 336-285-9073 - [facility] 336-845-3988 - Residential & Outpatient Substance Abuse Program  800-659-3381  Psychological Services: -  Health  [phone] - [facility]  [phone] - [place] [facility], [address], [place], ACCESS LINE: 1-800-853-5163 or 336-641-4981, Http://www.[facility].com/services/adult.htm  Dental Assistance  If unable to pay or uninsured, contact:  [facility] or [place] [facility] to become qualified for the adult dental  clinic.  Patients with Medicaid:  [facility] [address], [phone], [phone]  If unable   to pay, or uninsured, contact [facility] 302-717-3358) or [facility] Department (518)159-3924 in [place], [phone] in [facility]) to become qualified for the adult dental clinic  Other Low-Cost Community Dental Services: - [facility]- [address] [place], [place], [place], [address], [phone], [address]. [address], 2nd and 4th Thursday of the month at 6:30am.  10 clients each day by appointment, can sometimes see walk-in patients if someone does not show for an appointment. [facility]- [address] [place], [place], [address], [phone] - [facility]- [address], [place], [place], [address], [phone] [facility] Department- [phone] [facility] Health Department- [phone] [facility] [phone]     Take the prescription as directed.  Continue to take your muscle relaxant as previously directed.  Apply moist heat or ice to the area(s) of discomfort, for 15 minutes at a time, several times per day for the next few days.  Do not fall asleep on a heating or ice pack.  Call your regular medical doctor tomorrow morning to schedule a follow up appointment this week.  Return to the Emergency Department immediately if worsening.

## 2012-06-06 NOTE — ED Notes (Signed)
Pt reporting low back pain for several days.  Worse yesterday.  No additional complaints, no distress noted.

## 2012-06-06 NOTE — ED Notes (Signed)
Patient c/o lower left back pain that started yesterday after jumping from a bee sting. Patient states "I think I turned the wrong way when I jumped." Increase pain noted with movement,

## 2012-06-21 ENCOUNTER — Encounter (HOSPITAL_COMMUNITY): Payer: Self-pay | Admitting: *Deleted

## 2012-06-21 ENCOUNTER — Emergency Department (HOSPITAL_COMMUNITY)
Admission: EM | Admit: 2012-06-21 | Discharge: 2012-06-22 | Disposition: A | Discharge status: Home / Self Care | Payer: Medicaid Other | Attending: Emergency Medicine | Admitting: Emergency Medicine

## 2012-06-21 ENCOUNTER — Emergency Department (HOSPITAL_COMMUNITY): Payer: Medicaid Other

## 2012-06-21 DIAGNOSIS — K219 Gastro-esophageal reflux disease without esophagitis: Secondary | ICD-10-CM | POA: Insufficient documentation

## 2012-06-21 DIAGNOSIS — F17200 Nicotine dependence, unspecified, uncomplicated: Secondary | ICD-10-CM | POA: Insufficient documentation

## 2012-06-21 DIAGNOSIS — L03211 Cellulitis of face: Secondary | ICD-10-CM | POA: Insufficient documentation

## 2012-06-21 DIAGNOSIS — K589 Irritable bowel syndrome without diarrhea: Secondary | ICD-10-CM | POA: Insufficient documentation

## 2012-06-21 DIAGNOSIS — L0201 Cutaneous abscess of face: Secondary | ICD-10-CM | POA: Insufficient documentation

## 2012-06-21 DIAGNOSIS — Z79899 Other long term (current) drug therapy: Secondary | ICD-10-CM | POA: Insufficient documentation

## 2012-06-21 DIAGNOSIS — G8929 Other chronic pain: Secondary | ICD-10-CM | POA: Insufficient documentation

## 2012-06-21 LAB — CBC WITH DIFFERENTIAL/PLATELET
Basophils Absolute: 0 10*3/uL (ref 0.0–0.1)
Basophils Relative: 0 % (ref 0–1)
Eosinophils Absolute: 0.3 10*3/uL (ref 0.0–0.7)
Hemoglobin: 12.7 g/dL (ref 12.0–15.0)
MCH: 31.4 pg (ref 26.0–34.0)
MCHC: 33.8 g/dL (ref 30.0–36.0)
Neutro Abs: 6.3 10*3/uL (ref 1.7–7.7)
Neutrophils Relative %: 66 % (ref 43–77)
Platelets: 249 10*3/uL (ref 150–400)

## 2012-06-21 LAB — BASIC METABOLIC PANEL
Chloride: 100 mEq/L (ref 96–112)
GFR calc Af Amer: >90 mL/min (ref >90)
GFR calc non Af Amer: >90 mL/min (ref >90)
Potassium: 3.5 mEq/L (ref 3.5–5.1)
Sodium: 139 mEq/L (ref 135–145)

## 2012-06-21 MED ORDER — SODIUM CHLORIDE 0.9 % IV SOLN
INTRAVENOUS | Status: DC
  Administered 2012-06-21: 23:00:00 via INTRAVENOUS

## 2012-06-21 MED ORDER — SODIUM CHLORIDE 0.9 % IV BOLUS (SEPSIS)
250.0000 mL | Freq: Once | INTRAVENOUS | Status: AC
  Administered 2012-06-21: 250 mL via INTRAVENOUS

## 2012-06-21 MED ORDER — HYDROMORPHONE HCL PF 1 MG/ML IJ SOLN
1.0000 mg | Freq: Once | INTRAMUSCULAR | Status: AC
  Administered 2012-06-21: 1 mg via INTRAVENOUS
  Filled 2012-06-21: qty 1

## 2012-06-21 MED ORDER — DEXTROSE 5 % IV SOLN
1.0000 g | Freq: Once | INTRAVENOUS | Status: AC
  Administered 2012-06-21: 1 g via INTRAVENOUS
  Filled 2012-06-21: qty 10

## 2012-06-21 MED ORDER — ONDANSETRON HCL 4 MG/2ML IJ SOLN
4.0000 mg | Freq: Once | INTRAMUSCULAR | Status: AC
  Administered 2012-06-21: 4 mg via INTRAVENOUS
  Filled 2012-06-21: qty 2

## 2012-06-21 NOTE — ED Notes (Signed)
Pt states had a bump inside nose for a week, grandson hit pt in nose & now increase in pain & swelling since. Pt has taken doxycyline x2 for 2 days & states getting worse.

## 2012-06-21 NOTE — ED Provider Notes (Signed)
History   This chart was scribed for Shelda Jakes, MD by Melba Coon. The patient was seen in room APA15/[facility]15 and the patient's care was started at 10:13PM.    CSN: [id number]  Arrival date & time 06/21/12  1919   First MD Initiated Contact with Patient 06/21/12 2143      Chief Complaint  Patient presents with  . Cellulitis    (Consider location/radiation/quality/duration/timing/severity/associated sxs/prior treatment) HPI Jacqueline [name] is a 61 y.o. female who presents to the Emergency Department complaining of persistent, moderate to severe nasal pain pertaining to cellulitis with an onset 2 days ago. Pt has bumps on the interior and exterior right side of her nose. Pt also c/o slight pain with eye movement. Pt states that daughter had a kidney infection repair; pt was changing her daughter's dressings; pt states that ever since then, bumps have appeared. Pt has taken doxycycline 2x yesterday and today; did not alleviate the s/s. HA, chronic neck pain, nausea, and chronic cough (current everyday smoker). No fever, sore throat, rash, back pain, CP, SOB, abd pain, emesis, diarrhea, dysuria, or extremity pain, edema, weakness, numbness, or tingling. Allergic to Aspirin; Imitrex; Ketorolac tromethamine; Nsaids; Promethazine hcl; and Sulfonamide derivatives. No other pertinent medical symptoms.  PCP: Dr. Jacquelyne Balint in [place]  Past Medical History  Diagnosis Date  . Gastric nodule 2009    EUS, ?leiomyoma  . Irritable bowel syndrome   . History of pancreatitis     Biliary and/or etoh?  Marland Kitchen Anxiety   . Chronic back pain   . GERD (gastroesophageal reflux disease)   . Migraines   . 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 Peripheral neuropathy     Past Surgical History  Procedure Date  . Back surgery /ray cage fusion comberg, gso   . Toe graft   . Cesarean section   . 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  . Tonsillectomy   . Abdominal hysterectomy   . Dilation and curettage of uterus   . Gallbladder surgery 2010  . Eus 12/2008    Dr. Dulce Sellar. Gastric nodule most consistent with Leiomyoma. Slightly thickened and irregular gallbladder wall, possibly due to sludge or diminutive stones. Recommend EUS in January 2011 to followup gastric nodule.  . Esophagogastroduodenoscopy 11/2008    A 9-mm submucosal lesion seen in the cardia., gastritis, no hpylori  . Colonoscopy 2001    Dr. Karilyn Cota, hemorrhoids  . Cholecystectomy   . Colonoscopy 10/21/2011    Procedure: COLONOSCOPY;  Surgeon: Arlyce Harman, MD;  Location: [facility];  Service: Endoscopy;  Laterality: N/A;  8:30  . Savory dilation 02/03/2012    Procedure: SAVORY DILATION;  Surgeon: Arlyce Harman, MD;  Location: [facility];  Service: Endoscopy;  Laterality: N/A;  dilated with savory  # 15,16,17  . Back surgery   . Abdominal surgery     Family History  Problem Relation Age of Onset  . Colon cancer Mother     diagnosed in late 95s and died age 22  . Heart failure Mother   . Heart defect      family history   . Arthritis      family history  . COPD      family  history   . Cancer      multiple unknown type  . Heart attack Father     deceased at 77  . Hypertension Father   . Heart failure Father   . Lung cancer Maternal Uncle   . Throat cancer Maternal Uncle   . Colon cancer Paternal Uncle   . Colon cancer Maternal Aunt   . Colon cancer Other   . Anesthesia problems Neg Hx   . Hypotension Neg Hx   . Malignant hyperthermia Neg Hx   . Pseudochol deficiency Neg Hx     History  Substance Use Topics  . Smoking status: Current Everyday Smoker -- 0.5 packs/day for 35 years    Types: Cigarettes  . Smokeless tobacco: Never Used  . Alcohol Use: Yes      occasional holiday drink    OB History    Grav Para Term Preterm Abortions TAB SAB Ect Mult Living   3 2 2  1  1   2       Review of Systems 10 Systems reviewed and all are negative for acute change except as noted in the HPI.   Allergies  Aspirin; Imitrex; Ketorolac tromethamine; Nsaids; Promethazine hcl; and Sulfonamide derivatives  Home Medications   Current Outpatient Rx  Name Route Sig Dispense Refill  . DOXYCYCLINE HYCLATE 100 MG PO TABS Oral Take 100 mg by mouth 2 (two) times daily. For 2 days    . GABAPENTIN 600 MG PO TABS Oral Take 600 mg by mouth 3 (three) times daily.     Marland Kitchen METHOCARBAMOL 500 MG PO TABS Oral Take 500 mg by mouth 4 (four) times daily.    Marland Kitchen ONDANSETRON HCL 4 MG PO TABS Oral Take 4 mg by mouth every 6 (six) hours as needed. nausea    . PANTOPRAZOLE SODIUM 40 MG PO TBEC Oral Take 40 mg by mouth 2 (two) times daily.    . TRAMADOL HCL 50 MG PO TABS Oral Take 100 mg by mouth every 4 (four) hours as needed. Pain    . DOXYCYCLINE HYCLATE 100 MG PO CAPS Oral Take 1 capsule (100 mg total) by mouth 2 (two) times daily. 14 capsule 0  . HYDROCODONE-ACETAMINOPHEN 5-325 MG PO TABS Oral Take 1-2 tablets by mouth every 6 (six) hours as needed for pain. 14 tablet 0  . ONDANSETRON 8 MG PO TBDP Oral Take 1 tablet (8 mg total) by mouth every 8 (eight) hours as needed for nausea. 10 tablet 0    BP 145/95  Pulse 85  Temp 98.4 F (36.9 C) (Oral)  Resp 20  Ht 5\' 2"  (1.575 m)  Wt 123 lb (55.792 kg)  BMI 22.50 kg/m2  SpO2 98%  Physical Exam  Nursing note and vitals reviewed. Constitutional: She is oriented to person, place, and time. She appears well-developed and well-nourished. No distress.  HENT:  Head: Normocephalic and atraumatic.  Right Ear: External ear normal.  Left Ear: External ear normal.  Mouth/Throat: Oropharynx is clear and moist.       Erythematous oropharynx without tonsillar swelling.  Eyes: EOM are normal. No scleral icterus.       No pain with EOM.    Neck: Normal range of motion. Neck supple. No tracheal deviation present.  Cardiovascular: Normal rate, regular rhythm and normal heart sounds.   No murmur heard. Pulmonary/Chest: Effort normal and breath sounds normal. No respiratory distress. She has no wheezes. She has no rales.  Abdominal: Soft. Bowel sounds are normal. There is  no tenderness. There is no rebound and no guarding.  Musculoskeletal: Normal range of motion. She exhibits no edema and no tenderness.  Lymphadenopathy:    She has cervical adenopathy (Right-sided submandibular lymphadenopathy).  Neurological: She is alert and oriented to person, place, and time. No cranial nerve deficit.  Skin: Skin is warm and dry.       Swelling to rt side of nose with indurated area of 4 cm in diameter; erythema under eyelids without induration.  Psychiatric: She has a normal mood and affect. Her behavior is normal.    ED Course  Procedures (including critical care time)  DIAGNOSTIC STUDIES: Oxygen Saturation is 98% on room air, normal by my interpretation.    COORDINATION OF CARE:  10:18PM - EDMD will order IV fluids, zofran, dilaudid, rocephin, maxillofacial CT, and blood w/u for the pt.  Results for orders placed during the hospital encounter of 06/21/12  CBC WITH DIFFERENTIAL      Component Value Range   WBC 9.6  4.0 - 10.5 K/uL   RBC 4.04  3.87 - 5.11 MIL/uL   Hemoglobin 12.7  12.0 - 15.0 g/dL   HCT 40.9  81.1 - 91.4 %   MCV 93.1  78.0 - 100.0 fL   MCH 31.4  26.0 - 34.0 pg   MCHC 33.8  30.0 - 36.0 g/dL   RDW 78.2  95.6 - 21.3 %   Platelets 249  150 - 400 K/uL   Neutrophils Relative 66  43 - 77 %   Neutro Abs 6.3  1.7 - 7.7 K/uL   Lymphocytes Relative 25  12 - 46 %   Lymphs Abs 2.4  0.7 - 4.0 K/uL   Monocytes Relative 6  3 - 12 %   Monocytes Absolute 0.5  0.1 - 1.0 K/uL   Eosinophils Relative 3  0 - 5 %   Eosinophils Absolute 0.3  0.0 - 0.7 K/uL   Basophils Relative 0  0 - 1 %   Basophils Absolute 0.0  0.0 - 0.1 K/uL   BASIC METABOLIC PANEL      Component Value Range   Sodium 139  135 - 145 mEq/L   Potassium 3.5  3.5 - 5.1 mEq/L   Chloride 100  96 - 112 mEq/L   CO2 30  19 - 32 mEq/L   Glucose, Bld 96  70 - 99 mg/dL   BUN 7  6 - 23 mg/dL   Creatinine, Ser 0.86  0.50 - 1.10 mg/dL   Calcium 9.4  8.4 - 57.8 mg/dL   GFR calc non Af Amer >90  >90 mL/min   GFR calc Af Amer >90  >90 mL/min    Ct Maxillofacial W/cm  06/22/2012  *RADIOLOGY REPORT*  Clinical Data: Cellulitis, nasal pain.  CT MAXILLOFACIAL WITH CONTRAST  Technique:  Multidetector CT imaging of the maxillofacial structures was performed with intravenous contrast. Multiplanar CT image reconstructions were also generated.  Contrast:  75 ml Omnipaque 300 IV.  Comparison: 03/26/2011  Findings: Mild stranding/edema within the soft tissues of the nose compatible with cellulitis.  No evidence of abscess.  No intraorbital involvement or extension.  Orbital soft tissues unremarkable.  Minimal mucosal thickening in the ethmoid air cells. No air fluid levels in the paranasal sinuses.  Mastoids are clear. No bony abnormality.  IMPRESSION: Soft tissue edema/stranding within the nose, compatible with cellulitis.  Minimal chronic sinusitis changes.  Original Report Authenticated By: Cyndie Chime, M.D.     1. Cellulitis of face  MDM  CT scan only shows evidence of cellulitis no significant abscess no leukocytosis no fever here patient treated with 1 g of Rocephin as a booster will continue doxycycline  Provided along with pain medicine. Patient to followup with her primary care Dr. In 2 days return here for new worse symptoms.   I personally performed the services described in this documentation, which was scribed in my presence. The recorded information has been reviewed and considered.          Shelda Jakes, MD 06/22/12 [phone]

## 2012-06-21 NOTE — ED Notes (Signed)
Swelling , redness of nose and rt eye, Has been taking doxycycline that belonged to her daughter.

## 2012-06-22 MED ORDER — ONDANSETRON 8 MG PO TBDP: 8.0000 mg | ORAL_TABLET | Freq: Three times a day (TID) | ORAL | Status: AC | PRN

## 2012-06-22 MED ORDER — HYDROCODONE-ACETAMINOPHEN 5-325 MG PO TABS: 1.0000 | ORAL_TABLET | Freq: Four times a day (QID) | ORAL | Status: AC | PRN

## 2012-06-22 MED ORDER — DOXYCYCLINE HYCLATE 100 MG PO CAPS: 100.0000 mg | ORAL_CAPSULE | Freq: Two times a day (BID) | ORAL | Status: AC

## 2012-06-22 NOTE — Discharge Instructions (Signed)
Cellulitis Cellulitis is an infection of the tissue under the skin. The infected area is usually red and tender. This is caused by germs. These germs enter the body through cuts or sores. This usually happens in the arms or lower legs. HOME CARE   Take your medicine as told. Finish it even if you start to feel better.   If the infection is on the arm or leg, keep it raised (elevated).   Use a warm cloth on the infected area several times a day.   See your doctor for a follow-up visit as told.  GET HELP RIGHT AWAY IF:   You are tired or confused.   You throw up (vomit).   You have watery poop (diarrhea).   You feel ill and have muscle aches.   You have a fever.  MAKE SURE YOU:   Understand these instructions.   Will watch your condition.   Will get help right away if you are not doing well or get worse.  Document Released: 05/26/2008 Document Revised: 11/27/2011 Document Reviewed: 11/09/2009 [facility] Patient Information 2012 [place], [place].  Take antibiotic and pain medicine as directed. Followup with your doctor or return here for new worse symptoms thing she start to improve over the next 2 days. They get worse she need to get seen earlier.

## 2012-06-22 NOTE — ED Notes (Signed)
Pt alert & oriented x4, stable gait. Pt given discharge instructions, paperwork & prescription(s). Patient instructed to stop at the registration desk to finish any additional paperwork. pt verbalized understanding. Pt left department w/ no further questions.  

## 2012-06-29 ENCOUNTER — Encounter (HOSPITAL_COMMUNITY): Payer: Self-pay | Admitting: *Deleted

## 2012-06-29 ENCOUNTER — Emergency Department (HOSPITAL_COMMUNITY)
Admission: EM | Admit: 2012-06-29 | Discharge: 2012-06-29 | Disposition: A | Discharge status: Home / Self Care | Payer: Medicaid Other | Attending: Emergency Medicine | Admitting: Emergency Medicine

## 2012-06-29 DIAGNOSIS — F17200 Nicotine dependence, unspecified, uncomplicated: Secondary | ICD-10-CM | POA: Insufficient documentation

## 2012-06-29 DIAGNOSIS — M79609 Pain in unspecified limb: Secondary | ICD-10-CM | POA: Insufficient documentation

## 2012-06-29 DIAGNOSIS — G609 Hereditary and idiopathic neuropathy, unspecified: Secondary | ICD-10-CM | POA: Insufficient documentation

## 2012-06-29 DIAGNOSIS — K219 Gastro-esophageal reflux disease without esophagitis: Secondary | ICD-10-CM | POA: Insufficient documentation

## 2012-06-29 DIAGNOSIS — M5412 Radiculopathy, cervical region: Secondary | ICD-10-CM | POA: Insufficient documentation

## 2012-06-29 DIAGNOSIS — F411 Generalized anxiety disorder: Secondary | ICD-10-CM | POA: Insufficient documentation

## 2012-06-29 MED ORDER — MORPHINE SULFATE 10 MG/ML IJ SOLN
INTRAMUSCULAR | Status: AC
  Administered 2012-06-29: 10 mg via INTRAMUSCULAR
  Filled 2012-06-29: qty 1

## 2012-06-29 MED ORDER — MORPHINE SULFATE 2 MG/ML IJ SOLN: 8.0000 mg | Freq: Once | INTRAMUSCULAR | Status: DC

## 2012-06-29 MED ORDER — METHOCARBAMOL 500 MG PO TABS
1000.0000 mg | ORAL_TABLET | Freq: Once | ORAL | Status: AC
  Administered 2012-06-29: 1000 mg via ORAL
  Filled 2012-06-29: qty 2

## 2012-06-29 MED ORDER — ONDANSETRON HCL 4 MG PO TABS
4.0000 mg | ORAL_TABLET | Freq: Once | ORAL | Status: AC
  Administered 2012-06-29: 4 mg via ORAL
  Filled 2012-06-29: qty 1

## 2012-06-29 NOTE — ED Notes (Signed)
Pt refused to stay after narcotic injection for observation. Pt stated "my ride will leave me and I will be stranded, I have taken Morphine injections plenty of times and have never had any problems".

## 2012-06-29 NOTE — ED Notes (Signed)
Discharge instructions reviewed with pt, questions answered. Pt verbalized understanding.  

## 2012-06-29 NOTE — ED Notes (Signed)
Pt c/o bilateral arm numbness, tingling and pain that radiates down from neck area, pt states that she has had chronic problems with here neck for several months, the left arm has been going numb, tingling and pain for months, the right arm started doing the same thing a few days ago, pt also requesting recheck of abscess to nose area, pt states that she is having problems obtaining referral to neurosurgeon, and not able to obtain appointment with pcp until august,

## 2012-06-29 NOTE — ED Provider Notes (Signed)
History     CSN: [id number]  Arrival date & time 06/29/12  1607   First MD Initiated Contact with Patient 06/29/12 1831      Chief Complaint  Patient presents with  . Arm Pain    (Consider location/radiation/quality/duration/timing/severity/associated sxs/prior treatment) HPI Comments: Patient reports problems with her lower back that required surgery. She also has now developed problems of the cervical spine area. The patient has had an MRI which revealed some bulging type defects present. The patient has been having problems with numbness and tingling of the left arm for quite some time. She is now having numbness and tingling involving the right arm that is getting progressively worse. She is not dropping objects. She is able to move her arms. She is able to take care of her activities of daily living. She is having more and more pain and more and more numbness. The patient is scheduled for appointment with her primary care physician but this is several weeks off. She is therefore unable to be seen by neurosurgeon because she has to have a referral from her primary physician in order to see the neurosurgeon through her current insurance.  The patient was also treated recently for cellulitis involving the face. She requests that this be rechecked and evaluated as well. There's been no recent fever, chills, or drainage appreciated. The patient states that the swelling is improved. But she has a" lymph node" in her neck that is extremely sore and she would like to have this checked.  Patient is a 61 y.o. female presenting with arm pain. The history is provided by the patient.  Arm Pain Associated symptoms include abdominal pain and headaches. Pertinent negatives include no arthralgias, chest pain, coughing or neck pain.    Past Medical History  Diagnosis Date  . Gastric nodule 2009    EUS, ?leiomyoma  . Irritable bowel syndrome   . History of pancreatitis     Biliary and/or etoh?  Marland Kitchen  Anxiety   . Chronic back pain   . GERD (gastroesophageal reflux disease)   . Migraines   . 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 Peripheral neuropathy     Past Surgical History  Procedure Date  . Back surgery /ray cage fusion comberg, gso   . Toe graft   . Cesarean section   . 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  . Tonsillectomy   . Abdominal hysterectomy   . Dilation and curettage of uterus   . Gallbladder surgery 2010  . Eus 12/2008    Dr. Dulce Sellar. Gastric nodule most consistent with Leiomyoma. Slightly thickened and irregular gallbladder wall, possibly due to sludge or diminutive stones. Recommend EUS in January 2011 to followup gastric nodule.  . Esophagogastroduodenoscopy 11/2008    A 9-mm submucosal lesion seen in the cardia., gastritis, no hpylori  . Colonoscopy 2001    Dr. Karilyn Cota, hemorrhoids  . Cholecystectomy   . Colonoscopy 10/21/2011    Procedure: COLONOSCOPY;  Surgeon: Arlyce Harman, MD;  Location: [facility];  Service: Endoscopy;  Laterality: N/A;  8:30  . Savory dilation 02/03/2012    Procedure: SAVORY DILATION;  Surgeon: Arlyce Harman, MD;  Location: [facility];  Service: Endoscopy;  Laterality: N/A;  dilated with savory  # 15,16,17  . Back surgery   . Abdominal surgery     Family History  Problem Relation Age of Onset  . Colon cancer Mother     diagnosed in late 52s and died age 65  . Heart failure Mother   . Heart defect      family history   . Arthritis      family history  . COPD      family history   . Cancer      multiple unknown type  . Heart attack Father     deceased at 20  . Hypertension Father   . Heart failure Father   . Lung cancer Maternal Uncle   . Throat cancer Maternal Uncle   .  Colon cancer Paternal Uncle   . Colon cancer Maternal Aunt   . Colon cancer Other   . Anesthesia problems Neg Hx   . Hypotension Neg Hx   . Malignant hyperthermia Neg Hx   . Pseudochol deficiency Neg Hx     History  Substance Use Topics  . Smoking status: Current Everyday Smoker -- 0.5 packs/day for 35 years    Types: Cigarettes  . Smokeless tobacco: Never Used  . Alcohol Use: Yes     occasional holiday drink    OB History    Grav Para Term Preterm Abortions TAB SAB Ect Mult Living   3 2 2  1  1   2       Review of Systems  Constitutional: Negative for activity change.       All ROS Neg except as noted in HPI  HENT: Negative for nosebleeds and neck pain.   Eyes: Negative for photophobia and discharge.  Respiratory: Negative for cough, shortness of breath and wheezing.   Cardiovascular: Negative for chest pain and palpitations.  Gastrointestinal: Positive for abdominal pain. Negative for blood in stool.  Genitourinary: Negative for dysuria, frequency and hematuria.  Musculoskeletal: Positive for back pain. Negative for arthralgias.       Neck pain  Skin: Negative.        cellulitis  Neurological: Positive for headaches. Negative for dizziness, seizures and speech difficulty.  Psychiatric/Behavioral: Negative for hallucinations and confusion. The patient is nervous/anxious.     Allergies  Aspirin; Imitrex; Ketorolac tromethamine; Nsaids; Promethazine hcl; and Sulfonamide derivatives  Home Medications   Current Outpatient Rx  Name Route Sig Dispense Refill  . DOXYCYCLINE HYCLATE 100 MG PO TABS Oral Take 100 mg by mouth 2 (two) times daily. For 2 days    . DOXYCYCLINE HYCLATE 100 MG PO CAPS Oral Take 1 capsule (100 mg total) by mouth 2 (two) times daily. 14 capsule 0  . GABAPENTIN 600 MG PO TABS Oral Take 600 mg by mouth 3 (three) times daily.     Marland Kitchen HYDROCODONE-ACETAMINOPHEN 5-325 MG PO TABS Oral Take 1-2 tablets by mouth every 6 (six) hours as needed for pain. 14 tablet  0  . METHOCARBAMOL 500 MG PO TABS Oral Take 500 mg by mouth 4 (four) times daily.    Marland Kitchen ONDANSETRON 8 MG PO TBDP Oral Take 1 tablet (8 mg total) by mouth every 8 (eight) hours as needed for nausea. 10 tablet 0  . ONDANSETRON HCL 4 MG PO TABS Oral Take 4 mg by mouth every 6 (six) hours as needed. nausea    . PANTOPRAZOLE SODIUM 40 MG PO TBEC Oral Take 40 mg by mouth 2 (two) times daily.    . TRAMADOL  HCL 50 MG PO TABS Oral Take 100 mg by mouth every 4 (four) hours as needed. Pain      BP 134/81  Pulse 81  Temp 99.1 F (37.3 C) (Oral)  Resp 18  Ht 5\' 2"  (1.575 m)  Wt 118 lb (53.524 kg)  BMI 21.58 kg/m2  SpO2 99%  Physical Exam  Nursing note and vitals reviewed. Constitutional: She is oriented to person, place, and time. She appears well-developed and well-nourished.  Non-toxic appearance.  HENT:  Head: Normocephalic.  Right Ear: Tympanic membrane and external ear normal.  Left Ear: Tympanic membrane and external ear normal.       The lesion to the right nares has improved significantly when compared with the patient in the emergency record. The face is now symmetrical. The lesion that was inside the right nares is now scabbed.  Eyes: EOM and lids are normal. Pupils are equal, round, and reactive to light.  Neck: Normal range of motion. Neck supple. Carotid bruit is not present.       Pain in the right lateral base of the neck. Patient is extremely tender over one of the lymph nodes on the right. The area is not hot. There is no red streaking. No other lesion or abscess appreciated. No carotid bruits appreciated.  Cardiovascular: Normal rate, regular rhythm, normal heart sounds, intact distal pulses and normal pulses.   Pulmonary/Chest: Breath sounds normal. No respiratory distress.  Abdominal: Soft. Bowel sounds are normal. There is no tenderness. There is no guarding.  Musculoskeletal: Normal range of motion.       Pain to palpation of the cervical spine area and the lumbar spine area.  Some discomfort of the paraspinal area as well.  Lymphadenopathy:       Head (right side): No submandibular adenopathy present.       Head (left side): No submandibular adenopathy present.    She has no cervical adenopathy.  Neurological: She is alert and oriented to person, place, and time. She has normal strength. No cranial nerve deficit or sensory deficit.       Grip is symmetrical. Is no muscle atrophy of the upper arm or the thenar eminences. No major motor or sensory deficits appreciated. There is pain with attempted range of motion of right and left upper extremity.  Skin: Skin is warm and dry.  Psychiatric: She has a normal mood and affect. Her speech is normal.    ED Course  Procedures (including critical care time)  Labs Reviewed - No data to display No results found.   1. Cervical radicular pain       MDM  I have reviewed nursing notes, vital signs, and all appropriate lab and imaging results for this patient. The patient has a long-term history of back related problems, she is currently set up for evaluation in the her doctor's office concerning her cervical radiculopathy pain. She is also anticipating referral to a neurosurgeon at that time. This appointment is 2-3 weeks away at this time. The patient states that the numbness and pain of her upper extremities is getting worse. And the pain is continuing to change for the worse per the patient. Suggested to the patient to speak with her primary physician to be seen earlier, and or to see one of the clinics [facility] one [facility] the [facility] participate of this process. Patient is currently on Neurontin and Ultram. The patient is given an injection of morphine, and oral Robaxin here in the emergency department to  augment the medications she is currently taking.       Kathie Dike, [place] 06/29/12 1911

## 2012-06-30 NOTE — ED Provider Notes (Signed)
Medical screening examination/treatment/procedure(s) were performed by non-physician practitioner and as supervising physician I was immediately available for consultation/collaboration.  Geoffery Lyons, MD 06/30/12 [phone]

## 2012-07-31 ENCOUNTER — Encounter (HOSPITAL_COMMUNITY): Payer: Self-pay | Admitting: *Deleted

## 2012-07-31 ENCOUNTER — Emergency Department (HOSPITAL_COMMUNITY)
Admission: EM | Admit: 2012-07-31 | Discharge: 2012-07-31 | Disposition: A | Discharge status: Home / Self Care | Payer: Medicaid Other | Attending: Emergency Medicine | Admitting: Emergency Medicine

## 2012-07-31 DIAGNOSIS — K219 Gastro-esophageal reflux disease without esophagitis: Secondary | ICD-10-CM | POA: Insufficient documentation

## 2012-07-31 DIAGNOSIS — M5412 Radiculopathy, cervical region: Secondary | ICD-10-CM | POA: Insufficient documentation

## 2012-07-31 DIAGNOSIS — G8929 Other chronic pain: Secondary | ICD-10-CM | POA: Insufficient documentation

## 2012-07-31 DIAGNOSIS — F17200 Nicotine dependence, unspecified, uncomplicated: Secondary | ICD-10-CM | POA: Insufficient documentation

## 2012-07-31 MED ORDER — PREDNISONE 10 MG PO TABS
60.0000 mg | ORAL_TABLET | Freq: Once | ORAL | Status: AC
  Administered 2012-07-31: 60 mg via ORAL
  Filled 2012-07-31: qty 6

## 2012-07-31 MED ORDER — HYDROMORPHONE HCL PF 1 MG/ML IJ SOLN
1.0000 mg | Freq: Once | INTRAMUSCULAR | Status: AC
  Administered 2012-07-31: 1 mg via INTRAMUSCULAR
  Filled 2012-07-31: qty 1

## 2012-07-31 MED ORDER — GABAPENTIN 300 MG PO CAPS
600.0000 mg | ORAL_CAPSULE | Freq: Once | ORAL | Status: DC
  Filled 2012-07-31: qty 2

## 2012-07-31 MED ORDER — TRAMADOL HCL 50 MG PO TABS: 50.0000 mg | ORAL_TABLET | Freq: Four times a day (QID) | ORAL | Status: AC | PRN

## 2012-07-31 MED ORDER — PREDNISONE 50 MG PO TABS: ORAL_TABLET | ORAL | Status: AC

## 2012-07-31 MED ORDER — GABAPENTIN 600 MG PO TABS: 600.0000 mg | ORAL_TABLET | Freq: Three times a day (TID) | ORAL | Status: DC

## 2012-07-31 MED ORDER — ONDANSETRON 4 MG PO TBDP
4.0000 mg | ORAL_TABLET | Freq: Once | ORAL | Status: AC
  Administered 2012-07-31: 4 mg via ORAL
  Filled 2012-07-31: qty 1

## 2012-07-31 NOTE — ED Provider Notes (Signed)
Medical screening examination/treatment/procedure(s) were conducted as a shared visit with non-physician practitioner(s) and myself.  I personally evaluated the patient during the encounter.  Chronic neck pain.  Radiation to left arm. Will be seeing neurosurgeon soon  Donnetta Hutching, MD 07/31/12 [phone]

## 2012-07-31 NOTE — ED Notes (Signed)
Pt c/o neck pain that has gotten worse over since yesterday, pt states that she has problems with her neck, is attempting to see neurosurgeon but has not been able to yet. Pt states that her left arm is tingling and numbness as well, pt states that she has experienced numbness and tingling to her left arm when her neck has bothered her in the past.

## 2012-07-31 NOTE — ED Provider Notes (Signed)
History     CSN: [id number]  Arrival date & time 07/31/12  7829   First MD Initiated Contact with Patient 07/31/12 0957      Chief Complaint  Patient presents with  . Neck Pain    (Consider location/radiation/quality/duration/timing/severity/associated sxs/prior treatment) HPI Comments: Pt has h/o widespread DDD in c-spine.  Had MRI of c-spine here on 04-08-12.  Pt of dr. Elveria Rising?) and was seeing dr. Gerilyn Pilgrim.  Has no refills on ultram and neurontin.  Is in process of being referred to dr. Channing Mutters.  Would like  The MRI images put on a disc to take with her.  C/o chronic L hand numbness also.  Pan now unbearable.  Sitting on bed crying.  Patient is a 61 y.o. female presenting with neck pain. The history is provided by the patient. No language interpreter was used.  Neck Pain  This is a chronic problem. Episode onset: ~ 1 year ago. The problem occurs constantly. The pain is associated with nothing. The pain is present in the generalized neck. The quality of the pain is described as aching. The pain is severe. The symptoms are aggravated by bending. The pain is the same all the time. Associated symptoms include numbness. Pertinent negatives include no weakness.    Past Medical History  Diagnosis Date  . Gastric nodule 2009    EUS, ?leiomyoma  . Irritable bowel syndrome   . History of pancreatitis     Biliary and/or etoh?  Marland Kitchen Anxiety   . Chronic back pain   . GERD (gastroesophageal reflux disease)   . Migraines   . 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 Peripheral neuropathy     Past Surgical History  Procedure Date  . Back surgery /ray cage fusion comberg, gso   . Toe graft   . Cesarean section   . 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  . Tonsillectomy   . Abdominal hysterectomy   . Dilation and curettage of uterus   . Gallbladder surgery 2010  . Eus 12/2008    Dr. Dulce Sellar. Gastric nodule most consistent with Leiomyoma. Slightly thickened and irregular gallbladder wall, possibly due to sludge or diminutive stones. Recommend EUS in January 2011 to followup gastric nodule.  . Esophagogastroduodenoscopy 11/2008    A 9-mm submucosal lesion seen in the cardia., gastritis, no hpylori  . Colonoscopy 2001    Dr. Karilyn Cota, hemorrhoids  . Cholecystectomy   . Colonoscopy 10/21/2011    Procedure: COLONOSCOPY;  Surgeon: Arlyce Harman, MD;  Location: [facility];  Service: Endoscopy;  Laterality: N/A;  8:30  . Savory dilation 02/03/2012    Procedure: SAVORY DILATION;  Surgeon: Arlyce Harman, MD;  Location: [facility];  Service: Endoscopy;  Laterality: N/A;  dilated with savory  # 15,16,17  . Back surgery   . Abdominal surgery     Family History  Problem Relation Age of Onset  . Colon cancer Mother     diagnosed in late 19s and died age 88  . Heart failure Mother   . Heart defect      family history   . Arthritis      family history  . COPD      family history   .  Cancer      multiple unknown type  . Heart attack Father     deceased at 9  . Hypertension Father   . Heart failure Father   . Lung cancer Maternal Uncle   . Throat cancer Maternal Uncle   . Colon cancer Paternal Uncle   . Colon cancer Maternal Aunt   . Colon cancer Other   . Anesthesia problems Neg Hx   . Hypotension Neg Hx   . Malignant hyperthermia Neg Hx   . Pseudochol deficiency Neg Hx     History  Substance Use Topics  . Smoking status: Current Everyday Smoker -- 0.5 packs/day for 35 years    Types: Cigarettes  . Smokeless tobacco: Never Used  . Alcohol Use: Yes     occasional holiday drink    OB History    Grav Para Term Preterm Abortions TAB SAB Ect Mult Living   3 2 2  1  1   2       Review of Systems  HENT: Positive for neck  pain.   Neurological: Positive for numbness. Negative for weakness.  All other systems reviewed and are negative.    Allergies  Aspirin; Imitrex; Ketorolac tromethamine; Nsaids; Promethazine hcl; and Sulfonamide derivatives  Home Medications   Current Outpatient Rx  Name Route Sig Dispense Refill  . GABAPENTIN 600 MG PO TABS Oral Take 600 mg by mouth 3 (three) times daily.     Marland Kitchen METHOCARBAMOL 500 MG PO TABS Oral Take 500 mg by mouth 4 (four) times daily.    Marland Kitchen PANTOPRAZOLE SODIUM 40 MG PO TBEC Oral Take 40 mg by mouth 2 (two) times daily.    . TRAMADOL HCL 50 MG PO TABS Oral Take 100 mg by mouth every 4 (four) hours as needed. Pain    . GABAPENTIN 600 MG PO TABS Oral Take 1 tablet (600 mg total) by mouth 3 (three) times daily. 90 tablet 0  . PREDNISONE 50 MG PO TABS  One tab po QD 6 tablet 0  . TRAMADOL HCL 50 MG PO TABS Oral Take 1 tablet (50 mg total) by mouth every 6 (six) hours as needed for pain. 60 tablet 0    BP 155/90  Pulse 100  Temp 99.5 F (37.5 C)  Resp 20  Ht 5\' 2"  (1.575 m)  Wt 123 lb (55.792 kg)  BMI 22.50 kg/m2  SpO2 98%  Physical Exam  Nursing note and vitals reviewed. Constitutional: She is oriented to person, place, and time. She appears well-developed and well-nourished. She is cooperative. No distress.  HENT:  Head: Normocephalic and atraumatic.  Eyes: EOM are normal.  Neck: Normal range of motion.       Diffuse posterior neck pain.  No PT  Cardiovascular: Normal rate, regular rhythm and normal heart sounds.   Pulmonary/Chest: Effort normal and breath sounds normal.  Abdominal: Soft. She exhibits no distension. There is no tenderness.  Musculoskeletal: Normal range of motion.  Neurological: She is alert and oriented to person, place, and time. She has normal strength. She displays normal reflexes. Coordination normal. GCS eye subscore is 4. GCS verbal subscore is 5. GCS motor subscore is 6.  Reflex Scores:      Tricep reflexes are 2+ on the right side  and 2+ on the left side.      Bicep reflexes are 2+ on the right side and 2+ on the left side.      Brachioradialis reflexes are 2+ on the right side and  2+ on the left side. Skin: Skin is warm and dry.  Psychiatric: She has a normal mood and affect. Judgment normal.    ED Course  Procedures (including critical care time)  Labs Reviewed - No data to display No results found.   1. Chronic neck pain   2. Cervical radiculopathy       MDM  rx-neurontin,90 rx-prednisone 50 mg, 6 Ultram, 60 Ice F/u with PCP for referral to dr. Channing Mutters.        Evalina Field, [place] 07/31/12 1023

## 2012-09-01 ENCOUNTER — Encounter: Payer: Self-pay | Admitting: Gastroenterology

## 2012-09-12 ENCOUNTER — Encounter (HOSPITAL_COMMUNITY): Payer: Self-pay

## 2012-09-12 ENCOUNTER — Emergency Department (HOSPITAL_COMMUNITY)
Admission: EM | Admit: 2012-09-12 | Discharge: 2012-09-12 | Disposition: A | Discharge status: Home / Self Care | Payer: Medicaid Other | Attending: Emergency Medicine | Admitting: Emergency Medicine

## 2012-09-12 DIAGNOSIS — Z809 Family history of malignant neoplasm, unspecified: Secondary | ICD-10-CM | POA: Insufficient documentation

## 2012-09-12 DIAGNOSIS — F17200 Nicotine dependence, unspecified, uncomplicated: Secondary | ICD-10-CM | POA: Insufficient documentation

## 2012-09-12 DIAGNOSIS — Z8261 Family history of arthritis: Secondary | ICD-10-CM | POA: Insufficient documentation

## 2012-09-12 DIAGNOSIS — Z8249 Family history of ischemic heart disease and other diseases of the circulatory system: Secondary | ICD-10-CM | POA: Insufficient documentation

## 2012-09-12 DIAGNOSIS — K219 Gastro-esophageal reflux disease without esophagitis: Secondary | ICD-10-CM | POA: Insufficient documentation

## 2012-09-12 DIAGNOSIS — F411 Generalized anxiety disorder: Secondary | ICD-10-CM | POA: Insufficient documentation

## 2012-09-12 DIAGNOSIS — Z801 Family history of malignant neoplasm of trachea, bronchus and lung: Secondary | ICD-10-CM | POA: Insufficient documentation

## 2012-09-12 DIAGNOSIS — G43909 Migraine, unspecified, not intractable, without status migrainosus: Secondary | ICD-10-CM | POA: Insufficient documentation

## 2012-09-12 DIAGNOSIS — Z882 Allergy status to sulfonamides status: Secondary | ICD-10-CM | POA: Insufficient documentation

## 2012-09-12 DIAGNOSIS — G8929 Other chronic pain: Secondary | ICD-10-CM | POA: Insufficient documentation

## 2012-09-12 DIAGNOSIS — Z888 Allergy status to other drugs, medicaments and biological substances status: Secondary | ICD-10-CM | POA: Insufficient documentation

## 2012-09-12 DIAGNOSIS — M549 Dorsalgia, unspecified: Secondary | ICD-10-CM | POA: Insufficient documentation

## 2012-09-12 DIAGNOSIS — Z8489 Family history of other specified conditions: Secondary | ICD-10-CM | POA: Insufficient documentation

## 2012-09-12 DIAGNOSIS — Z800 Family history of malignant neoplasm of digestive organs: Secondary | ICD-10-CM | POA: Insufficient documentation

## 2012-09-12 MED ORDER — DIPHENHYDRAMINE HCL 50 MG/ML IJ SOLN
25.0000 mg | Freq: Once | INTRAMUSCULAR | Status: AC
  Administered 2012-09-12: 25 mg via INTRAVENOUS
  Filled 2012-09-12: qty 1

## 2012-09-12 MED ORDER — HYDROMORPHONE HCL PF 1 MG/ML IJ SOLN
1.0000 mg | Freq: Once | INTRAMUSCULAR | Status: AC
  Administered 2012-09-12: 1 mg via INTRAVENOUS
  Filled 2012-09-12: qty 1

## 2012-09-12 MED ORDER — METHYLPREDNISOLONE SODIUM SUCC 125 MG IJ SOLR
125.0000 mg | Freq: Once | INTRAMUSCULAR | Status: AC
  Administered 2012-09-12: 125 mg via INTRAVENOUS
  Filled 2012-09-12: qty 2

## 2012-09-12 MED ORDER — SODIUM CHLORIDE 0.9 % IV BOLUS (SEPSIS)
1000.0000 mL | Freq: Once | INTRAVENOUS | Status: AC
  Administered 2012-09-12: 1000 mL via INTRAVENOUS

## 2012-09-12 MED ORDER — PROMETHAZINE HCL 25 MG/ML IJ SOLN
12.5000 mg | Freq: Once | INTRAMUSCULAR | Status: AC
  Administered 2012-09-12: 12.5 mg via INTRAVENOUS
  Filled 2012-09-12: qty 1

## 2012-09-12 MED ORDER — HYDROCODONE-ACETAMINOPHEN 5-325 MG PO TABS: 1.0000 | ORAL_TABLET | ORAL | Status: AC | PRN

## 2012-09-12 MED ORDER — METOCLOPRAMIDE HCL 5 MG/ML IJ SOLN
10.0000 mg | Freq: Once | INTRAMUSCULAR | Status: AC
  Administered 2012-09-12: 10 mg via INTRAVENOUS
  Filled 2012-09-12: qty 2

## 2012-09-12 NOTE — ED Provider Notes (Signed)
History     CSN: [id number]  Arrival date & time 09/12/12  0865   First MD Initiated Contact with Patient 09/12/12 [phone]      Chief Complaint  Patient presents with  . Migraine    (Consider location/radiation/quality/duration/timing/severity/associated sxs/prior treatment) HPI Comments: Jacqueline [name] presents with a migraine headache in association with nausea and emesis x1 last night.  Her headache started 2 days ago and was gradual in progression and associated with photophobia and phonophobia.  Her headache pain is behind her right thigh and right for head area which is consistent with her prior episodes of migraine headache.  She denies any fevers or chills, has had no dizziness or lightheadedness and has had no distal weakness or numbness.  She has had tramadol with no relief of her headache.  She is scheduled to see his her PCP in 2 days for a general followup and states she ran out of her Neurontin yesterday which is usually helpful for controlling her migraines.  Her last migraine over 1 month ago.  The history is provided by the patient.    Past Medical History  Diagnosis Date  . Gastric nodule 2009    EUS, ?leiomyoma  . Irritable bowel syndrome   . History of pancreatitis     Biliary and/or etoh?  Marland Kitchen Anxiety   . Chronic back pain   . GERD (gastroesophageal reflux disease)   . Migraines   . 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 Peripheral neuropathy     Past Surgical History  Procedure Date  . Back surgery /ray cage fusion comberg, gso   . Toe graft   . Cesarean section   . 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  . Tonsillectomy   . Abdominal hysterectomy   . Dilation and curettage of uterus   . Gallbladder  surgery 2010  . Eus 12/2008    Dr. Dulce Sellar. Gastric nodule most consistent with Leiomyoma. Slightly thickened and irregular gallbladder wall, possibly due to sludge or diminutive stones. Recommend EUS in January 2011 to followup gastric nodule.  . Esophagogastroduodenoscopy 11/2008    A 9-mm submucosal lesion seen in the cardia., gastritis, no hpylori  . Colonoscopy 2001    Dr. Karilyn Cota, hemorrhoids  . Cholecystectomy   . Colonoscopy 10/21/2011    Procedure: COLONOSCOPY;  Surgeon: Arlyce Harman, MD;  Location: [facility];  Service: Endoscopy;  Laterality: N/A;  8:30  . Savory dilation 02/03/2012    Procedure: SAVORY DILATION;  Surgeon: Arlyce Harman, MD;  Location: [facility];  Service: Endoscopy;  Laterality: N/A;  dilated with savory  # 15,16,17  . Back surgery   . Abdominal surgery     Family History  Problem Relation Age of Onset  . Colon cancer Mother     diagnosed in late 70s and died age 32  . Heart failure Mother   . Heart defect      family history   . Arthritis      family history  . COPD      family history   . Cancer      multiple unknown type  . Heart attack Father     deceased at 17  . Hypertension Father   .  Heart failure Father   . Lung cancer Maternal Uncle   . Throat cancer Maternal Uncle   . Colon cancer Paternal Uncle   . Colon cancer Maternal Aunt   . Colon cancer Other   . Anesthesia problems Neg Hx   . Hypotension Neg Hx   . Malignant hyperthermia Neg Hx   . Pseudochol deficiency Neg Hx     History  Substance Use Topics  . Smoking status: Current Every Day Smoker -- 0.5 packs/day for 35 years    Types: Cigarettes  . Smokeless tobacco: Never Used  . Alcohol Use: Yes     occasional holiday drink    OB History    Grav Para Term Preterm Abortions TAB SAB Ect Mult Living   3 2 2  1  1   2       Review of Systems  Constitutional: Negative for fever and chills.  HENT: Negative for congestion, sore throat and neck pain.   Eyes: Negative.     Respiratory: Negative for chest tightness and shortness of breath.   Cardiovascular: Negative for chest pain.  Gastrointestinal: Negative for nausea and abdominal pain.  Genitourinary: Negative.   Musculoskeletal: Negative for joint swelling and arthralgias.  Skin: Negative.  Negative for rash and wound.  Neurological: Positive for headaches. Negative for dizziness, speech difficulty, weakness, light-headedness and numbness.  Hematological: Negative.   Psychiatric/Behavioral: Negative.     Allergies  Aspirin; Imitrex; Ketorolac tromethamine; Nsaids; Promethazine hcl; and Sulfonamide derivatives  Home Medications   Current Outpatient Rx  Name Route Sig Dispense Refill  . DIPHENHYDRAMINE HCL (SLEEP) 25 MG PO TABS Oral Take 25 mg by mouth at bedtime as needed. For sleep    . GABAPENTIN 600 MG PO TABS Oral Take 600 mg by mouth 3 (three) times daily.     Marland Kitchen METHOCARBAMOL 500 MG PO TABS Oral Take 500 mg by mouth 4 (four) times daily.    Marland Kitchen PANTOPRAZOLE SODIUM 40 MG PO TBEC Oral Take 40 mg by mouth 2 (two) times daily.    . TRAMADOL HCL 50 MG PO TABS Oral Take 100 mg by mouth every 4 (four) hours as needed. Pain    . HYDROCODONE-ACETAMINOPHEN 5-325 MG PO TABS Oral Take 1 tablet by mouth every 4 (four) hours as needed for pain. 15 tablet 0    BP 112/63  Pulse 58  Temp 98.3 F (36.8 C) (Oral)  Resp 20  Ht 5\' 3"  (1.6 m)  Wt 120 lb (54.432 kg)  BMI 21.26 kg/m2  SpO2 97%  Physical Exam  Nursing note and vitals reviewed. Constitutional: She is oriented to person, place, and time. She appears well-developed and well-nourished.       Uncomfortable appearing  HENT:  Head: Normocephalic and atraumatic.  Mouth/Throat: Oropharynx is clear and moist.  Eyes: EOM are normal. Pupils are equal, round, and reactive to light.  Neck: Normal range of motion. Neck supple.  Cardiovascular: Normal rate and normal heart sounds.   Pulmonary/Chest: Effort normal.  Abdominal: Soft. There is no  tenderness.  Musculoskeletal: Normal range of motion.  Lymphadenopathy:    She has no cervical adenopathy.  Neurological: She is alert and oriented to person, place, and time. She has normal strength. No sensory deficit. Gait normal. GCS eye subscore is 4. GCS verbal subscore is 5. GCS motor subscore is 6.       Normal heel-shin, normal rapid alternating movements. Cranial nerves III-XII intact.  No pronator drift.  Skin: Skin is warm and  dry. No rash noted.  Psychiatric: She has a normal mood and affect. Her speech is normal and behavior is normal. Thought content normal. Cognition and memory are normal.    ED Course  Procedures (including critical care time)  Labs Reviewed - No data to display No results found.   1. Migraine     Patient given normal saline bolus while in the ED in addition to Solu-Medrol 125, Reglan 10 mg and Benadryl 25 mg IV with improvement in nausea but persistent headache.  She was then given Phenergan 2.5 mg and Dilaudid 1 mg IV with an additional saline bolus and her headache resolved with this second round of treatment.  MDM  Patient with acute on chronic migraine headache which is the same pattern of her prior migraines with no new symptoms and no physical exam findings of neurologic deficit.  She was prescribed a small course of hydrocodone, encouraged to go home and rest and followup with her doctor in 2 days as already scheduled, but contacting sooner if her symptoms worsen.  Daughter is driving patient home today.        Burgess Amor, [place] 09/12/12 1336

## 2012-09-12 NOTE — ED Provider Notes (Signed)
Medical screening examination/treatment/procedure(s) were performed by non-physician practitioner and as supervising physician I was immediately available for consultation/collaboration.   Saron Vanorman, MD 09/12/12 1533 

## 2012-09-12 NOTE — ED Notes (Signed)
Pt reports headache that started on Friday and now has progressed into migraine. +nausea, is out of her meds.

## 2012-09-12 NOTE — ED Notes (Signed)
Discharge instructions reviewed with pt, questions answered. Pt verbalized understanding.  

## 2013-06-15 DIAGNOSIS — R079 Chest pain, unspecified: Secondary | ICD-10-CM

## 2013-07-27 DIAGNOSIS — R079 Chest pain, unspecified: Secondary | ICD-10-CM

## 2013-08-09 ENCOUNTER — Other Ambulatory Visit: Payer: Self-pay

## 2013-08-09 ENCOUNTER — Telehealth: Payer: Self-pay | Admitting: *Deleted

## 2013-08-09 ENCOUNTER — Other Ambulatory Visit: Payer: Self-pay | Admitting: Gastroenterology

## 2013-08-09 DIAGNOSIS — R1310 Dysphagia, unspecified: Secondary | ICD-10-CM

## 2013-08-09 DIAGNOSIS — R0789 Other chest pain: Secondary | ICD-10-CM

## 2013-08-09 DIAGNOSIS — R079 Chest pain, unspecified: Secondary | ICD-10-CM

## 2013-08-09 DIAGNOSIS — R1319 Other dysphagia: Secondary | ICD-10-CM

## 2013-08-09 NOTE — Telephone Encounter (Signed)
Pt called stating she is having chest pain that radiates to her jaw and she is having trouble sallowing, pt wants to know if that is related to her pancreatics. Please advise [phone] or [phone].

## 2013-08-09 NOTE — Telephone Encounter (Signed)
Patient is scheduled for BPE on Friday Aug 29th at 9:30 and she is aware

## 2013-08-09 NOTE — Telephone Encounter (Signed)
Called and informed pt. She is aware that Jacqueline [name] will schedule the Barium Pill study. She is scheduled for OV with AS on 09/08/2013 at 9:30 AM. I am mailing her a copy of the labs and also faxing an order to [facility] ( she lives in [place]).

## 2013-08-09 NOTE — Telephone Encounter (Signed)
I called pt and she said she is not having any pain at this time. She had two episodes over the weekend of pain between her breasts that gets kind of intense and lasts 10-20 min. The pain sometimes radiates to her jaw. She just had a stress test and was advised that it was normal. She said she has started having these attacks 3-4 x monthly for the last several months and she had pancreatitis before and it feels similar. She is also having swallowing difficulty. I told her she probably needs an appt and I will see when Dr. Darrick Penna recommends her to come in.

## 2013-08-09 NOTE — Telephone Encounter (Signed)
PLEASE CALL PT. She needs a a barium pill esophagram within the next 7 days and a lipase/HFP WHEN SHE HAS ANOTHER ATTACK. Please schedule 1st available E 30 appt for DYSPHAGIA/CHEST PAIN.

## 2013-08-19 ENCOUNTER — Ambulatory Visit (HOSPITAL_COMMUNITY)
Admission: RE | Admit: 2013-08-19 | Discharge: 2013-08-19 | Disposition: A | Discharge status: Home / Self Care | Payer: Medicaid Other | Source: Ambulatory Visit | Attending: Gastroenterology | Admitting: Gastroenterology

## 2013-08-19 DIAGNOSIS — R1013 Epigastric pain: Secondary | ICD-10-CM | POA: Insufficient documentation

## 2013-08-19 DIAGNOSIS — R0789 Other chest pain: Secondary | ICD-10-CM

## 2013-08-19 DIAGNOSIS — R1319 Other dysphagia: Secondary | ICD-10-CM

## 2013-08-19 DIAGNOSIS — R1310 Dysphagia, unspecified: Secondary | ICD-10-CM | POA: Insufficient documentation

## 2013-08-23 ENCOUNTER — Telehealth: Payer: Self-pay | Admitting: *Deleted

## 2013-08-23 NOTE — Telephone Encounter (Signed)
Pt had a BPE on 08/19/2013. She has been some uncomfortable since the procedure. She said she mostly snacked yesterday, but she ate ritz crackers with colby cheese and some cake that had cheeries and pecans. She said she is having pain this morning in her mid stomach. She had a BM yesterday that was kind of hard, but today more normal.  Please advise!

## 2013-08-23 NOTE — Telephone Encounter (Signed)
Called and informed pt. She said she had the labs done about a week ago. I called [facility] and Diane is faxing them over.

## 2013-08-23 NOTE — Telephone Encounter (Signed)
Pt called stating she had the EGD done Friday, pt states her stomach has some pain and she feels bloated. Please advise [phone]

## 2013-08-23 NOTE — Telephone Encounter (Signed)
BPE unremarkable.  Had her do her lipase and LFTs. Back down to clear liquids until abdominal pain improved.

## 2013-08-24 NOTE — Telephone Encounter (Signed)
Labs received from [place]. Lipase 19 and Hepatic profile normal results. Copy on Dr. Darrick Penna desk .

## 2013-08-31 NOTE — Telephone Encounter (Signed)
Pt aware and she has a diet.

## 2013-08-31 NOTE — Telephone Encounter (Addendum)
PLEASE CALL PT. HER CHEST PAIN RADIATING TO HER JAW MAY BE DUE TO REFLUX. SHE SHOULD:  1.STOP SMOKING.  2. AVOID SODA.  3. CUT DOWN ON COFFEE.  4. CONTINUE PROTONIX. TAKE 30 MINUTES PRIOR TO MEALS TWICE DAILY.  5. FOLLOW A LOW FAT DIET. SHE MAY PICK UP A HAND OUT. 6. OPV IN 2 MOS E30 CHEST PAIN W/ LL OR SLF.

## 2013-09-06 NOTE — Telephone Encounter (Signed)
DS has already made OV for patient on 9/18 at 0930 with AS and pt is aware

## 2013-09-07 ENCOUNTER — Encounter: Payer: Self-pay | Admitting: Gastroenterology

## 2013-09-08 ENCOUNTER — Ambulatory Visit: Payer: Medicaid Other | Admitting: Gastroenterology

## 2013-10-05 ENCOUNTER — Emergency Department (HOSPITAL_COMMUNITY)
Admission: EM | Admit: 2013-10-05 | Discharge: 2013-10-05 | Disposition: A | Discharge status: Home / Self Care | Payer: Medicaid Other | Attending: Emergency Medicine | Admitting: Emergency Medicine

## 2013-10-05 ENCOUNTER — Encounter (HOSPITAL_COMMUNITY): Payer: Self-pay | Admitting: Emergency Medicine

## 2013-10-05 DIAGNOSIS — G609 Hereditary and idiopathic neuropathy, unspecified: Secondary | ICD-10-CM | POA: Insufficient documentation

## 2013-10-05 DIAGNOSIS — R109 Unspecified abdominal pain: Secondary | ICD-10-CM | POA: Insufficient documentation

## 2013-10-05 DIAGNOSIS — L02818 Cutaneous abscess of other sites: Secondary | ICD-10-CM | POA: Insufficient documentation

## 2013-10-05 DIAGNOSIS — L0291 Cutaneous abscess, unspecified: Secondary | ICD-10-CM

## 2013-10-05 DIAGNOSIS — G8929 Other chronic pain: Secondary | ICD-10-CM | POA: Insufficient documentation

## 2013-10-05 DIAGNOSIS — G43909 Migraine, unspecified, not intractable, without status migrainosus: Secondary | ICD-10-CM | POA: Insufficient documentation

## 2013-10-05 DIAGNOSIS — F411 Generalized anxiety disorder: Secondary | ICD-10-CM | POA: Insufficient documentation

## 2013-10-05 DIAGNOSIS — Z79899 Other long term (current) drug therapy: Secondary | ICD-10-CM | POA: Insufficient documentation

## 2013-10-05 DIAGNOSIS — F17200 Nicotine dependence, unspecified, uncomplicated: Secondary | ICD-10-CM | POA: Insufficient documentation

## 2013-10-05 DIAGNOSIS — Z8719 Personal history of other diseases of the digestive system: Secondary | ICD-10-CM | POA: Insufficient documentation

## 2013-10-05 DIAGNOSIS — M549 Dorsalgia, unspecified: Secondary | ICD-10-CM | POA: Insufficient documentation

## 2013-10-05 MED ORDER — DOXYCYCLINE HYCLATE 50 MG PO CAPS: 100.0000 mg | ORAL_CAPSULE | Freq: Two times a day (BID) | ORAL | Status: DC

## 2013-10-05 MED ORDER — DOXYCYCLINE HYCLATE 100 MG PO TABS
100.0000 mg | ORAL_TABLET | Freq: Once | ORAL | Status: AC
  Administered 2013-10-05: 100 mg via ORAL
  Filled 2013-10-05: qty 1

## 2013-10-05 MED ORDER — CEFTRIAXONE SODIUM 1 G IJ SOLR
1.0000 g | Freq: Once | INTRAMUSCULAR | Status: AC
  Administered 2013-10-05: 1 g via INTRAMUSCULAR
  Filled 2013-10-05: qty 10

## 2013-10-05 MED ORDER — HYDROCODONE-ACETAMINOPHEN 5-325 MG PO TABS: ORAL_TABLET | ORAL | Status: DC

## 2013-10-05 MED ORDER — AMOXICILLIN-POT CLAVULANATE 875-125 MG PO TABS: 1.0000 | ORAL_TABLET | Freq: Two times a day (BID) | ORAL | Status: DC

## 2013-10-05 MED ORDER — ONDANSETRON HCL 4 MG PO TABS
4.0000 mg | ORAL_TABLET | Freq: Once | ORAL | Status: AC
  Administered 2013-10-05: 4 mg via ORAL
  Filled 2013-10-05: qty 1

## 2013-10-05 MED ORDER — LIDOCAINE HCL (PF) 1 % IJ SOLN
INTRAMUSCULAR | Status: AC
  Administered 2013-10-05: 19:00:00
  Filled 2013-10-05: qty 5

## 2013-10-05 MED ORDER — HYDROCODONE-ACETAMINOPHEN 5-325 MG PO TABS
2.0000 | ORAL_TABLET | Freq: Once | ORAL | Status: AC
  Administered 2013-10-05: 2 via ORAL
  Filled 2013-10-05: qty 2

## 2013-10-05 NOTE — ED Provider Notes (Signed)
CSN: [id number]     Arrival date & time 10/05/13  1718 History   First MD Initiated Contact with Patient 10/05/13 1746     Chief Complaint  Patient presents with  . Abscess   (Consider location/radiation/quality/duration/timing/severity/associated sxs/prior Treatment) HPI Comments: Patient is a 62 year old female who presents to the emergency department with pain to the right side of the head and involving the ear. The patient states that approximately a week ago she had a bump or pimple on the side of her head. She attempted to rupture it and after that she began to have more and more swelling of the scalp of the right side of the head. Yesterday she began to notice some soreness of the right year, and today she noticed increased redness of the top portion of the ear and pain between the ear and the scalp. The patient states she has felt feverish and achy during the day. She's not had any vomiting. She has not measured a temperature up to this point. It is of note that the patient has been treated for recurrent abscesses.  The history is provided by the patient.    Past Medical History  Diagnosis Date  . Gastric nodule 2009    EUS, ?leiomyoma  . Irritable bowel syndrome   . History of pancreatitis     Biliary and/or etoh?  Marland Kitchen Anxiety   . Chronic back pain   . GERD (gastroesophageal reflux disease)   . Migraines   . 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 Peripheral neuropathy    Past Surgical History  Procedure Laterality Date  . Back surgery /ray cage fusion comberg, gso    . Toe graft    . Cesarean section    . 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  . Tonsillectomy    . Abdominal hysterectomy    . Dilation and curettage of  uterus    . Gallbladder surgery  2010  . Eus  12/2008    Dr. Dulce Sellar. Gastric nodule most consistent with Leiomyoma. Slightly thickened and irregular gallbladder wall, possibly due to sludge or diminutive stones. Recommend EUS in January 2011 to followup gastric nodule.  . Esophagogastroduodenoscopy  11/2008    A 9-mm submucosal lesion seen in the cardia., gastritis, no hpylori  . Colonoscopy  2001    Dr. Karilyn Cota, hemorrhoids  . Cholecystectomy    . Colonoscopy  10/21/2011    WUJ:[id number] polyp multiple/internal hemorrhoids  . Savory dilation  02/03/2012    WGN:[id number] in the distal esophagus/Mild gastritis  . Back surgery    . Abdominal surgery     Family History  Problem Relation Age of Onset  . Colon cancer Mother     diagnosed in late 84s and died age 25  . Heart failure Mother   . Heart defect      family history   . Arthritis      family history  . COPD      family history   . Cancer      multiple unknown type  . Heart attack Father     deceased at 67  . Hypertension Father   . Heart failure Father   . Lung cancer Maternal Uncle   .  Throat cancer Maternal Uncle   . Colon cancer Paternal Uncle   . Colon cancer Maternal Aunt   . Colon cancer Other   . Anesthesia problems Neg Hx   . Hypotension Neg Hx   . Malignant hyperthermia Neg Hx   . Pseudochol deficiency Neg Hx    History  Substance Use Topics  . Smoking status: Current Every Day Smoker -- 0.50 packs/day for 35 years    Types: Cigarettes  . Smokeless tobacco: Never Used  . Alcohol Use: Yes     Comment: occasional holiday drink   OB History   Grav Para Term Preterm Abortions TAB SAB Ect Mult Living   3 2 2  1  1   2      Review of Systems  Constitutional: Negative for activity change.       All ROS Neg except as noted in HPI  HENT: Negative for nosebleeds.   Eyes: Negative for photophobia and discharge.  Respiratory: Negative for cough, shortness of breath and wheezing.   Cardiovascular: Negative for  chest pain and palpitations.  Gastrointestinal: Positive for abdominal pain. Negative for blood in stool.  Genitourinary: Negative for dysuria, frequency and hematuria.  Musculoskeletal: Positive for back pain. Negative for arthralgias and neck pain.  Skin: Negative.   Neurological: Positive for headaches. Negative for dizziness, seizures and speech difficulty.  Psychiatric/Behavioral: Negative for hallucinations and confusion. The patient is nervous/anxious.     Allergies  Aspirin; Imitrex; Ketorolac tromethamine; Nsaids; Promethazine hcl; and Sulfonamide derivatives  Home Medications   Current Outpatient Rx  Name  Route  Sig  Dispense  Refill  . amoxicillin-clavulanate (AUGMENTIN) 875-125 MG per tablet   Oral   Take 1 tablet by mouth 2 (two) times daily.   14 tablet   0   . diphenhydrAMINE (SOMINEX) 25 MG tablet   Oral   Take 25 mg by mouth at bedtime as needed. For sleep         . doxycycline (VIBRAMYCIN) 50 MG capsule   Oral   Take 2 capsules (100 mg total) by mouth 2 (two) times daily.   14 capsule   0   . gabapentin (NEURONTIN) 600 MG tablet   Oral   Take 600 mg by mouth 3 (three) times daily.          Marland Kitchen HYDROcodone-acetaminophen (NORCO) 5-325 MG per tablet      1 or 2 po q4h prn pain   12 tablet   0   . methocarbamol (ROBAXIN) 500 MG tablet   Oral   Take 500 mg by mouth 4 (four) times daily.         . pantoprazole (PROTONIX) 40 MG tablet   Oral   Take 40 mg by mouth 2 (two) times daily.         . traMADol (ULTRAM) 50 MG tablet   Oral   Take 100 mg by mouth every 4 (four) hours as needed. Pain          BP 130/84  Pulse 79  Temp(Src) 99.3 F (37.4 C) (Oral)  Resp 18  Ht 5\' 2"  (1.575 m)  Wt 113 lb (51.256 kg)  BMI 20.66 kg/m2  SpO2 99% Physical Exam  Nursing note and vitals reviewed. Constitutional: She is oriented to person, place, and time. She appears well-developed and well-nourished.  Non-toxic appearance.  HENT:  Head:  Normocephalic.  Right Ear: Tympanic membrane and external ear normal.  Left Ear: Tympanic membrane and external ear normal.  There  is a broken skin area at the right temporal area with mild drainage. The right scalp is sore. The top  Of the the right ear is swollen. The ear is not hot. The EAC is clear, and the TM is wnl. No red or painful area of the mastoid.  Eyes: EOM and lids are normal. Pupils are equal, round, and reactive to light.  Neck: Normal range of motion. Neck supple. Carotid bruit is not present.  Cardiovascular: Normal rate, regular rhythm, normal heart sounds, intact distal pulses and normal pulses.   Pulmonary/Chest: Breath sounds normal. No respiratory distress.  Abdominal: Soft. Bowel sounds are normal. There is no tenderness. There is no guarding.  Musculoskeletal: Normal range of motion.  Lymphadenopathy:       Head (right side): No submandibular adenopathy present.       Head (left side): No submandibular adenopathy present.    She has no cervical adenopathy.  Neurological: She is alert and oriented to person, place, and time. She has normal strength. No cranial nerve deficit or sensory deficit.  Skin: Skin is warm and dry.  Psychiatric: She has a normal mood and affect. Her speech is normal.    ED Course  Procedures (including critical care time) Labs Review Labs Reviewed - No data to display Imaging Review No results found.  EKG Interpretation   None       MDM   1. Abscess and cellulitis    **I have reviewed nursing notes, vital signs, and all appropriate lab and imaging results for this patient.*  Patient has an abscess with some cellulitis to the right temporal area extending into the scalp and to the top of the ear on the right.  The plan at this time is for the patient to receive Augmentin and doxycycline daily, as well as Norco for pain. I have stressed to the patient the importance of this being followed closely. The patient acknowledges  understanding of the situation. Patient is to followup with her primary physician in the next 48 hours for recheck of this condition.  Kathie Dike, PA-C 10/05/13 2239

## 2013-10-05 NOTE — ED Notes (Signed)
Pt reports noticed a bump on r side of head approx 1 week ago.  Reports area has gotten bigger and is tender and swollen.   Reports has been having frequent abscesses.

## 2013-10-06 NOTE — ED Provider Notes (Signed)
Medical screening examination/treatment/procedure(s) were performed by non-physician practitioner and as supervising physician I was immediately available for consultation/collaboration.  Shelda Jakes, MD 10/06/13 1155

## 2014-02-05 ENCOUNTER — Emergency Department (HOSPITAL_COMMUNITY): Payer: Medicaid Other

## 2014-02-05 ENCOUNTER — Encounter (HOSPITAL_COMMUNITY): Payer: Self-pay | Admitting: Emergency Medicine

## 2014-02-05 ENCOUNTER — Emergency Department (HOSPITAL_COMMUNITY)
Admission: EM | Admit: 2014-02-05 | Discharge: 2014-02-05 | Disposition: A | Discharge status: Home / Self Care | Payer: Medicaid Other | Attending: Emergency Medicine | Admitting: Emergency Medicine

## 2014-02-05 DIAGNOSIS — F411 Generalized anxiety disorder: Secondary | ICD-10-CM | POA: Insufficient documentation

## 2014-02-05 DIAGNOSIS — Y929 Unspecified place or not applicable: Secondary | ICD-10-CM | POA: Insufficient documentation

## 2014-02-05 DIAGNOSIS — S99929A Unspecified injury of unspecified foot, initial encounter: Principal | ICD-10-CM

## 2014-02-05 DIAGNOSIS — M25462 Effusion, left knee: Secondary | ICD-10-CM

## 2014-02-05 DIAGNOSIS — W010XXA Fall on same level from slipping, tripping and stumbling without subsequent striking against object, initial encounter: Secondary | ICD-10-CM | POA: Insufficient documentation

## 2014-02-05 DIAGNOSIS — S8990XA Unspecified injury of unspecified lower leg, initial encounter: Secondary | ICD-10-CM | POA: Insufficient documentation

## 2014-02-05 DIAGNOSIS — Y939 Activity, unspecified: Secondary | ICD-10-CM | POA: Insufficient documentation

## 2014-02-05 DIAGNOSIS — G43909 Migraine, unspecified, not intractable, without status migrainosus: Secondary | ICD-10-CM | POA: Insufficient documentation

## 2014-02-05 DIAGNOSIS — F17200 Nicotine dependence, unspecified, uncomplicated: Secondary | ICD-10-CM | POA: Insufficient documentation

## 2014-02-05 DIAGNOSIS — K219 Gastro-esophageal reflux disease without esophagitis: Secondary | ICD-10-CM | POA: Insufficient documentation

## 2014-02-05 DIAGNOSIS — G609 Hereditary and idiopathic neuropathy, unspecified: Secondary | ICD-10-CM | POA: Insufficient documentation

## 2014-02-05 DIAGNOSIS — G8929 Other chronic pain: Secondary | ICD-10-CM | POA: Insufficient documentation

## 2014-02-05 DIAGNOSIS — Z8739 Personal history of other diseases of the musculoskeletal system and connective tissue: Secondary | ICD-10-CM | POA: Insufficient documentation

## 2014-02-05 DIAGNOSIS — Z79899 Other long term (current) drug therapy: Secondary | ICD-10-CM | POA: Insufficient documentation

## 2014-02-05 DIAGNOSIS — S99919A Unspecified injury of unspecified ankle, initial encounter: Principal | ICD-10-CM

## 2014-02-05 HISTORY — DX: Pain in unspecified knee: M25.569

## 2014-02-05 MED ORDER — OXYCODONE-ACETAMINOPHEN 5-325 MG PO TABS: 1.0000 | ORAL_TABLET | ORAL | Status: DC | PRN

## 2014-02-05 MED ORDER — HYDROCODONE-ACETAMINOPHEN 5-325 MG PO TABS
2.0000 | ORAL_TABLET | Freq: Once | ORAL | Status: AC
  Administered 2014-02-05: 2 via ORAL
  Filled 2014-02-05: qty 2

## 2014-02-05 NOTE — Discharge Instructions (Signed)
Knee Effusion  Knee effusion means you have fluid in your knee. The knee may be more difficult to bend and move. HOME CARE  Use crutches or a brace as told by your doctor.  Put ice on the injured area.  Put ice in a plastic bag.  Place a towel between your skin and the bag.  Leave the ice on for 15-20 minutes, 03-04 times a day.  Raise (elevate) your knee as much as possible.  Only take medicine as told by your doctor.  You may need to do strengthening exercises. Ask your doctor.  Continue with your normal diet and activities as told by your doctor. GET HELP RIGHT AWAY IF:  You have more puffiness (swelling) in your knee.  You see redness, puffiness, or have more pain in your knee.  You have a temperature by mouth above 102 F (38.9 C).  You get a rash.  You have trouble breathing.  You have a reaction to any medicine you are taking.  You have a lot of pain when you move your knee. MAKE SURE YOU:  Understand these instructions.  Will watch your condition.  Will get help right away if you are not doing well or get worse. Document Released: 01/10/2011 Document Revised: 03/01/2012 Document Reviewed: 01/10/2011 [employer] Patient Information 2014 [employer].  

## 2014-02-05 NOTE — ED Notes (Signed)
Fell onto left knee last night. Has already had surgery in past on left knee. Slight swelling noted. Has been ambulatory at home. nad

## 2014-02-05 NOTE — ED Notes (Signed)
Pt seen and evaluated by EDPa for initial assessment. 

## 2014-02-05 NOTE — ED Provider Notes (Signed)
CSN: [id number]     Arrival date & time 02/05/14  1416 History  This chart was scribed for non-physician practitioner Evalee Jefferson, PA-C working with Maudry Diego, MD by Ludger Nutting, ED Scribe. This patient was seen in room APFT24/APFT24 and the patient's care was started at 2:50 PM.    Chief Complaint  Patient presents with  . Knee Pain      The history is provided by the patient. No language interpreter was used.    HPI Comments: Jacqueline [name] is a 63 y.o. female who presents to the Emergency Department complaining of a fall that occurred last night. She tripped over her dog and struck her left knee on concrete. She has constant, unchanged left anterior and posterior knee pain with associated swelling. She is able to weight bear although quite painful.  She has applied ice and used her chronic hydrocodone medicine without relief. She denies numbness or weakness in the extremity. She denies other injury.  Past Medical History  Diagnosis Date  . Gastric nodule 2009    EUS, ?leiomyoma  . Irritable bowel syndrome   . History of pancreatitis     Biliary and/or etoh?  Marland Kitchen Anxiety   . Chronic back pain   . GERD (gastroesophageal reflux disease)   . Migraines   . 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 Peripheral neuropathy   . Knee pain    Past Surgical History  Procedure Laterality Date  . Back surgery /ray cage fusion comberg, gso    . Toe graft    . Cesarean section    . 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  . Tonsillectomy    . Abdominal hysterectomy    . Dilation and curettage of uterus    . Gallbladder surgery  2010  . Eus  12/2008    Dr. Paulita Fujita. Gastric nodule most consistent with Leiomyoma. Slightly thickened and  irregular gallbladder wall, possibly due to sludge or diminutive stones. Recommend EUS in January 2011 to followup gastric nodule.  . Esophagogastroduodenoscopy  11/2008    A 9-mm submucosal lesion seen in the cardia., gastritis, no hpylori  . Colonoscopy  2001    Dr. Laural Golden, hemorrhoids  . Cholecystectomy    . Colonoscopy  10/21/2011    GUR:KYHCWCB polyp multiple/internal hemorrhoids  . Savory dilation  02/03/2012    JSE:[id number] in the distal esophagus/Mild gastritis  . Back surgery    . Abdominal surgery     Family History  Problem Relation Age of Onset  . Colon cancer Mother     diagnosed in late 79s and died age 37  . Heart failure Mother   . Heart defect      family history   . Arthritis      family history  . COPD      family history   . Cancer      multiple unknown type  . Heart attack Father     deceased at 64  . Hypertension Father   . Heart failure Father   . Lung cancer Maternal Uncle   . Throat cancer Maternal Uncle   . Colon cancer Paternal Uncle   . Colon cancer Maternal Aunt   . Colon cancer Other   .  Anesthesia problems Neg Hx   . Hypotension Neg Hx   . Malignant hyperthermia Neg Hx   . Pseudochol deficiency Neg Hx    History  Substance Use Topics  . Smoking status: Current Every Day Smoker -- 0.50 packs/day for 35 years    Types: Cigarettes  . Smokeless tobacco: Never Used  . Alcohol Use: Yes     Comment: occasional holiday drink   OB History   Grav Para Term Preterm Abortions TAB SAB Ect Mult Living   3 2 2  1  1   2      Review of Systems  Constitutional: Negative for fever.  Musculoskeletal: Positive for arthralgias and joint swelling. Negative for myalgias.  Neurological: Negative for weakness and numbness.      Allergies  Aspirin; Imitrex; Ketorolac tromethamine; Nsaids; Promethazine hcl; and Sulfonamide derivatives  Home Medications   Current Outpatient Rx  Name  Route  Sig  Dispense  Refill  . cyclobenzaprine (FLEXERIL) 10 MG  tablet   Oral   Take 10 mg by mouth 3 (three) times daily as needed for muscle spasms.         Marland Kitchen gabapentin (NEURONTIN) 300 MG capsule   Oral   Take 600 mg by mouth 3 (three) times daily.         . pantoprazole (PROTONIX) 40 MG tablet   Oral   Take 40 mg by mouth 2 (two) times daily.         Marland Kitchen oxyCODONE-acetaminophen (PERCOCET/ROXICET) 5-325 MG per tablet   Oral   Take 1 tablet by mouth every 4 (four) hours as needed for severe pain.   20 tablet   0    BP 114/67  Pulse 87  Temp(Src) 98.2 F (36.8 C) (Oral)  Resp 16  Ht 5\' 2"  (1.575 m)  Wt 120 lb (54.432 kg)  BMI 21.94 kg/m2  SpO2 99% Physical Exam  Nursing note and vitals reviewed. Constitutional: She appears well-developed and well-nourished.  HENT:  Head: Atraumatic.  Neck: Normal range of motion.  Cardiovascular:  Pulses equal bilaterally  Musculoskeletal: She exhibits tenderness.       Left hip: She exhibits tenderness.       Left knee: She exhibits decreased range of motion, effusion and bony tenderness. She exhibits no deformity, no erythema, normal alignment, no LCL laxity and no MCL laxity. Tenderness found.       Legs: Left knee: Obvious moderate knee effusion. Tenderness to palpation across entire patella. Prominent medial varicose vein. Modestly tender to left groin. Mild ttp left anterior mid groin.  Pain with attempts to flex left hip, no pain with passive flexion.  Neurological: She is alert. She has normal strength. She displays normal reflexes. No sensory deficit.  Equal strength  Skin: Skin is warm and dry.  Psychiatric: She has a normal mood and affect.    ED Course  Procedures (including critical care time)  DIAGNOSTIC STUDIES: Oxygen Saturation is 99% on RA, normal by my interpretation.    COORDINATION OF CARE: 2:59 PM Will order XRAY. Discussed treatment plan with pt at bedside and pt agreed to plan.   Labs Review Labs Reviewed - No data to display Imaging Review Dg Hip Complete  Left  02/05/2014   CLINICAL DATA:  Status post fall.  Left hip pain.  EXAM: LEFT HIP - COMPLETE 2+ VIEW  COMPARISON:  None.  FINDINGS: No fracture or dislocation is identified. Joint spaces are preserved. Chondrocalcinosis is noted. The patient is status post lower  lumbar fusion.  IMPRESSION: No acute finding.  Chondrocalcinosis noted.   Electronically Signed   By: Inge Rise M.D.   On: 02/05/2014 15:44   Dg Knee Complete 4 Views Left  02/05/2014   CLINICAL DATA:  Status post fall 1 day ago.  Left knee pain.  EXAM: LEFT KNEE - COMPLETE 4+ VIEW  COMPARISON:  Plain films left knee 02/03/2011.  FINDINGS: No fracture is identified. The patient has a large knee joint effusion. Chondrocalcinosis is noted.  IMPRESSION: Negative for fracture.  Large joint effusion.  Chondrocalcinosis.   Electronically Signed   By: Inge Rise M.D.   On: 02/05/2014 15:43    EKG Interpretation   None       MDM   Final diagnoses:  Knee effusion, left    Patients labs and/or radiological studies were viewed and considered during the medical decision making and disposition process. Discussed finding with patient and offered to drain her knee effusion. She defers at this time,  Will f/u with Dr Aline Brochure this week for further management.  She was placed in a Jones dressing (knee immobilizer too big), crutches given, encouraged RICE, recheck by her orthopedist.  The patient appears reasonably screened and/or stabilized for discharge and I doubt any other medical condition or other [facility] requiring further screening, evaluation, or treatment in the ED at this time prior to discharge.   I personally performed the services described in this documentation, which was scribed in my presence. The recorded information has been reviewed and is accurate.   Evalee Jefferson, PA-C 02/05/14 1725

## 2014-02-06 NOTE — ED Provider Notes (Signed)
Medical screening examination/treatment/procedure(s) were performed by non-physician practitioner and as supervising physician I was immediately available for consultation/collaboration.  EKG Interpretation   None         Maudry Diego, MD 02/06/14 1538

## 2014-03-30 ENCOUNTER — Emergency Department (HOSPITAL_COMMUNITY)
Admission: EM | Admit: 2014-03-30 | Discharge: 2014-03-30 | Disposition: A | Discharge status: Home / Self Care | Payer: Medicaid Other | Attending: Emergency Medicine | Admitting: Emergency Medicine

## 2014-03-30 ENCOUNTER — Encounter (HOSPITAL_COMMUNITY): Payer: Self-pay | Admitting: Emergency Medicine

## 2014-03-30 DIAGNOSIS — K219 Gastro-esophageal reflux disease without esophagitis: Secondary | ICD-10-CM | POA: Insufficient documentation

## 2014-03-30 DIAGNOSIS — G8929 Other chronic pain: Secondary | ICD-10-CM | POA: Insufficient documentation

## 2014-03-30 DIAGNOSIS — F17200 Nicotine dependence, unspecified, uncomplicated: Secondary | ICD-10-CM | POA: Insufficient documentation

## 2014-03-30 DIAGNOSIS — Z79899 Other long term (current) drug therapy: Secondary | ICD-10-CM | POA: Insufficient documentation

## 2014-03-30 DIAGNOSIS — Z8669 Personal history of other diseases of the nervous system and sense organs: Secondary | ICD-10-CM | POA: Insufficient documentation

## 2014-03-30 DIAGNOSIS — G43909 Migraine, unspecified, not intractable, without status migrainosus: Secondary | ICD-10-CM | POA: Insufficient documentation

## 2014-03-30 DIAGNOSIS — F411 Generalized anxiety disorder: Secondary | ICD-10-CM | POA: Insufficient documentation

## 2014-03-30 MED ORDER — HYDROMORPHONE HCL PF 1 MG/ML IJ SOLN
1.0000 mg | Freq: Once | INTRAMUSCULAR | Status: AC
  Administered 2014-03-30: 1 mg via INTRAMUSCULAR
  Filled 2014-03-30: qty 1

## 2014-03-30 MED ORDER — DIPHENHYDRAMINE HCL 50 MG/ML IJ SOLN
25.0000 mg | Freq: Once | INTRAMUSCULAR | Status: AC
  Administered 2014-03-30: 25 mg via INTRAMUSCULAR
  Filled 2014-03-30: qty 1

## 2014-03-30 MED ORDER — HYDROCODONE-ACETAMINOPHEN 5-325 MG PO TABS: 1.0000 | ORAL_TABLET | ORAL | Status: DC | PRN

## 2014-03-30 MED ORDER — ONDANSETRON 8 MG PO TBDP
8.0000 mg | ORAL_TABLET | Freq: Once | ORAL | Status: AC
  Administered 2014-03-30: 8 mg via ORAL
  Filled 2014-03-30: qty 1

## 2014-03-30 MED ORDER — DEXAMETHASONE SODIUM PHOSPHATE 4 MG/ML IJ SOLN
8.0000 mg | Freq: Once | INTRAMUSCULAR | Status: AC
  Administered 2014-03-30: 8 mg via INTRAMUSCULAR
  Filled 2014-03-30: qty 2

## 2014-03-30 NOTE — ED Notes (Signed)
Pt alert & oriented x4, stable gait. Patient given discharge instructions, paperwork & prescription(s). Patient  instructed to stop at the registration desk to finish any additional paperwork. Patient verbalized understanding. Pt left department w/ no further questions. 

## 2014-03-30 NOTE — ED Notes (Signed)
Pt c/o headache since Monday.  Reports some n/v yesterday, none today.

## 2014-03-30 NOTE — ED Provider Notes (Signed)
CSN: [id number]     Arrival date & time 03/30/14  1021 History   First MD Initiated Contact with Patient 03/30/14 1112     Chief Complaint  Patient presents with  . Headache     (Consider location/radiation/quality/duration/timing/severity/associated sxs/prior Treatment) HPI Comments: Jacqueline [name] is a 63 y.o. Female presenting with migraine headache which started 3 days ago.  The patient has a history of migraine and the current symptoms are similar to previous episodes of migraine headache.  The patients symptoms were not preceded by prodromal symptoms.  The patient has right sided pain in association with nausea with vomiting yesterday, but has been able to tolerate po fluids today.  Additionally,  She reports photophobia and phonophobia.  There has been no fevers, chills, syncope, visual changes, confusion or localized weakness.  The patient has tried gabapentin which she takes tid for migraine prophylaxis without relief of symptoms.  She will take hydrocodone for acute migraine flares but ran out of this medicines several weeks ago.      The history is provided by the patient.    Past Medical History  Diagnosis Date  . Gastric nodule 2009    EUS, ?leiomyoma  . Irritable bowel syndrome   . History of pancreatitis     Biliary and/or etoh?  Marland Kitchen Anxiety   . Chronic back pain   . GERD (gastroesophageal reflux disease)   . Migraines   . 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 Peripheral neuropathy   . Knee pain    Past Surgical History  Procedure Laterality Date  . Back surgery /ray cage fusion comberg, gso    . Toe graft    . Cesarean section    . 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  . Tonsillectomy    . Abdominal  hysterectomy    . Dilation and curettage of uterus    . Gallbladder surgery  2010  . Eus  12/2008    Dr. Paulita Fujita. Gastric nodule most consistent with Leiomyoma. Slightly thickened and irregular gallbladder wall, possibly due to sludge or diminutive stones. Recommend EUS in January 2011 to followup gastric nodule.  . Esophagogastroduodenoscopy  11/2008    A 9-mm submucosal lesion seen in the cardia., gastritis, no hpylori  . Colonoscopy  2001    Dr. Laural Golden, hemorrhoids  . Cholecystectomy    . Colonoscopy  10/21/2011    OVF:IEPPIRJ polyp multiple/internal hemorrhoids  . Savory dilation  02/03/2012    JOA:[id number] in the distal esophagus/Mild gastritis  . Back surgery    . Abdominal surgery     Family History  Problem Relation Age of Onset  . Colon cancer Mother     diagnosed in late 75s and died age 26  . Heart failure Mother   . Heart defect      family history   . Arthritis      family history  . COPD      family history   . Cancer      multiple unknown type  . Heart attack Father     deceased at 56  . Hypertension Father   . Heart failure Father   . Lung cancer Maternal Uncle   . Throat cancer Maternal Uncle   .  Colon cancer Paternal Uncle   . Colon cancer Maternal Aunt   . Colon cancer Other   . Anesthesia problems Neg Hx   . Hypotension Neg Hx   . Malignant hyperthermia Neg Hx   . Pseudochol deficiency Neg Hx    History  Substance Use Topics  . Smoking status: Current Every Day Smoker -- 0.50 packs/day for 35 years    Types: Cigarettes  . Smokeless tobacco: Never Used  . Alcohol Use: Yes     Comment: occasional holiday drink   OB History   Grav Para Term Preterm Abortions TAB SAB Ect Mult Living   3 2 2  1  1   2      Review of Systems  Constitutional: Negative for fever and chills.  HENT: Negative for congestion and sore throat.   Eyes: Positive for photophobia. Negative for visual disturbance.  Respiratory: Negative for chest tightness and shortness of  breath.   Cardiovascular: Negative for chest pain.  Gastrointestinal: Positive for nausea and vomiting. Negative for abdominal pain.  Genitourinary: Negative.   Musculoskeletal: Negative for arthralgias, joint swelling and neck pain.  Skin: Negative.  Negative for rash and wound.  Neurological: Positive for headaches. Negative for dizziness, speech difficulty, weakness, light-headedness and numbness.  Psychiatric/Behavioral: Negative.       Allergies  Aspirin; Imitrex; Ketorolac tromethamine; Nsaids; Promethazine hcl; and Sulfonamide derivatives  Home Medications   Current Outpatient Rx  Name  Route  Sig  Dispense  Refill  . gabapentin (NEURONTIN) 300 MG capsule   Oral   Take 600 mg by mouth 3 (three) times daily.         Marland Kitchen HYDROcodone-acetaminophen (NORCO/VICODIN) 5-325 MG per tablet   Oral   Take 1 tablet by mouth 3 (three) times daily.         Marland Kitchen ibuprofen (ADVIL,MOTRIN) 200 MG tablet   Oral   Take 400-800 mg by mouth every 8 (eight) hours as needed for moderate pain.         . methocarbamol (ROBAXIN) 750 MG tablet   Oral   Take 750 mg by mouth 4 (four) times daily.         Marland Kitchen omeprazole (PRILOSEC) 40 MG capsule   Oral   Take 40 mg by mouth 2 (two) times daily before a meal.         . HYDROcodone-acetaminophen (NORCO/VICODIN) 5-325 MG per tablet   Oral   Take 1 tablet by mouth every 4 (four) hours as needed for moderate pain.   10 tablet   0    BP 109/65  Pulse 73  Temp(Src) 97.5 F (36.4 C) (Oral)  Resp 18  Ht 5\' 2"  (1.575 m)  Wt 110 lb (49.896 kg)  BMI 20.11 kg/m2  SpO2 100% Physical Exam  Nursing note and vitals reviewed. Constitutional: She is oriented to person, place, and time. She appears well-developed and well-nourished.  Uncomfortable appearing  HENT:  Head: Normocephalic and atraumatic.  Mouth/Throat: Oropharynx is clear and moist.  Eyes: EOM are normal. Pupils are equal, round, and reactive to light.  Neck: Normal range of motion.  Neck supple.  Cardiovascular: Normal rate and normal heart sounds.   Pulmonary/Chest: Effort normal.  Abdominal: Soft. There is no tenderness.  Musculoskeletal: Normal range of motion.  Lymphadenopathy:    She has no cervical adenopathy.  Neurological: She is alert and oriented to person, place, and time. She has normal strength. No sensory deficit. Gait normal. GCS eye subscore is 4. GCS verbal  subscore is 5. GCS motor subscore is 6.  Normal heel-shin, normal rapid alternating movements. Cranial nerves III-XII intact.  No pronator drift.  Skin: Skin is warm and dry. No rash noted.  Psychiatric: She has a normal mood and affect. Her speech is normal and behavior is normal. Thought content normal. Cognition and memory are normal.    ED Course  Procedures (including critical care time) Labs Review Labs Reviewed - No data to display Imaging Review No results found.   EKG Interpretation None      MDM   Final diagnoses:  Migraine    Pt was given Decadron 8 mg IM, Benadryl 25 mg IM and Zofran ODT 8 mg with relief of nausea but with still 9/10 headache pain.  She was given Dilaudid 1 mg IM with improvement.  Exam is nonfocal with no neuro deficits.  Her daughter is driving her home.  Encouraged followup with her PCP if symptoms persist.  She will plan to go home and rest the rest afternoon.  Cautioned regarding the sedating medications and she should not drive for at least the next 4 hours.  The patient appears reasonably screened and/or stabilized for discharge and I doubt any other medical condition or other [facility] requiring further screening, evaluation, or treatment in the ED at this time prior to discharge.     Evalee Jefferson, PA-C 03/30/14 1700

## 2014-03-30 NOTE — Discharge Instructions (Signed)
Migraine Headache A migraine headache is an intense, throbbing pain on one or both sides of your head. A migraine can last for 30 minutes to several hours. CAUSES  The exact cause of a migraine headache is not always known. However, a migraine may be caused when nerves in the brain become irritated and release chemicals that cause inflammation. This causes pain. Certain things may also trigger migraines, such as:  Alcohol.  Smoking.  Stress.  Menstruation.  Aged cheeses.  Foods or drinks that contain nitrates, glutamate, aspartame, or tyramine.  Lack of sleep.  Chocolate.  Caffeine.  Hunger.  Physical exertion.  Fatigue.  Medicines used to treat chest pain (nitroglycerine), birth control pills, estrogen, and some blood pressure medicines. SIGNS AND SYMPTOMS  Pain on one or both sides of your head.  Pulsating or throbbing pain.  Severe pain that prevents daily activities.  Pain that is aggravated by any physical activity.  Nausea, vomiting, or both.  Dizziness.  Pain with exposure to bright lights, loud noises, or activity.  General sensitivity to bright lights, loud noises, or smells. Before you get a migraine, you may get warning signs that a migraine is coming (aura). An aura may include:  Seeing flashing lights.  Seeing bright spots, halos, or zig-zag lines.  Having tunnel vision or blurred vision.  Having feelings of numbness or tingling.  Having trouble talking.  Having muscle weakness. DIAGNOSIS  A migraine headache is often diagnosed based on:  Symptoms.  Physical exam.  A CT scan or MRI of your head. These imaging tests cannot diagnose migraines, but they can help rule out other causes of headaches. TREATMENT Medicines may be given for pain and nausea. Medicines can also be given to help prevent recurrent migraines.  HOME CARE INSTRUCTIONS  Only take over-the-counter or prescription medicines for pain or discomfort as directed by your  health care provider. The use of long-term narcotics is not recommended.  Lie down in a dark, quiet room when you have a migraine.  Keep a journal to find out what may trigger your migraine headaches. For example, write down:  What you eat and drink.  How much sleep you get.  Any change to your diet or medicines.  Limit alcohol consumption.  Quit smoking if you smoke.  Get 7 9 hours of sleep, or as recommended by your health care provider.  Limit stress.  Keep lights dim if bright lights bother you and make your migraines worse. SEEK IMMEDIATE MEDICAL CARE IF:   Your migraine becomes severe.  You have a fever.  You have a stiff neck.  You have vision loss.  You have muscular weakness or loss of muscle control.  You start losing your balance or have trouble walking.  You feel faint or pass out.  You have severe symptoms that are different from your first symptoms. MAKE SURE YOU:   Understand these instructions.  Will watch your condition.  Will get help right away if you are not doing well or get worse. Document Released: 12/08/2005 Document Revised: 09/28/2013 Document Reviewed: 08/15/2013 [employer] Patient Information 2014 [employer].  

## 2014-04-05 NOTE — ED Provider Notes (Signed)
Medical screening examination/treatment/procedure(s) were performed by non-physician practitioner and as supervising physician I was immediately available for consultation/collaboration.   EKG Interpretation None      Rolland Porter, MD, Abram Sander   Janice Norrie, MD 04/05/14 1259

## 2014-06-18 ENCOUNTER — Emergency Department (HOSPITAL_COMMUNITY)
Admission: EM | Admit: 2014-06-18 | Discharge: 2014-06-18 | Disposition: A | Discharge status: Home / Self Care | Payer: Medicaid Other | Attending: Emergency Medicine | Admitting: Emergency Medicine

## 2014-06-18 ENCOUNTER — Encounter (HOSPITAL_COMMUNITY): Payer: Self-pay | Admitting: Emergency Medicine

## 2014-06-18 DIAGNOSIS — F411 Generalized anxiety disorder: Secondary | ICD-10-CM | POA: Insufficient documentation

## 2014-06-18 DIAGNOSIS — Z8739 Personal history of other diseases of the musculoskeletal system and connective tissue: Secondary | ICD-10-CM | POA: Insufficient documentation

## 2014-06-18 DIAGNOSIS — G8929 Other chronic pain: Secondary | ICD-10-CM | POA: Insufficient documentation

## 2014-06-18 DIAGNOSIS — F17200 Nicotine dependence, unspecified, uncomplicated: Secondary | ICD-10-CM | POA: Insufficient documentation

## 2014-06-18 DIAGNOSIS — G43009 Migraine without aura, not intractable, without status migrainosus: Secondary | ICD-10-CM

## 2014-06-18 DIAGNOSIS — Z79899 Other long term (current) drug therapy: Secondary | ICD-10-CM | POA: Insufficient documentation

## 2014-06-18 DIAGNOSIS — K219 Gastro-esophageal reflux disease without esophagitis: Secondary | ICD-10-CM | POA: Insufficient documentation

## 2014-06-18 DIAGNOSIS — G609 Hereditary and idiopathic neuropathy, unspecified: Secondary | ICD-10-CM | POA: Insufficient documentation

## 2014-06-18 DIAGNOSIS — G43909 Migraine, unspecified, not intractable, without status migrainosus: Secondary | ICD-10-CM | POA: Insufficient documentation

## 2014-06-18 MED ORDER — METOCLOPRAMIDE HCL 5 MG/ML IJ SOLN
10.0000 mg | Freq: Once | INTRAMUSCULAR | Status: AC
  Administered 2014-06-18: 10 mg via INTRAVENOUS
  Filled 2014-06-18: qty 2

## 2014-06-18 MED ORDER — DIPHENHYDRAMINE HCL 50 MG/ML IJ SOLN
12.5000 mg | Freq: Once | INTRAMUSCULAR | Status: AC
Start: 2014-06-18 — End: 2014-06-18
  Administered 2014-06-18: 12.5 mg via INTRAVENOUS
  Filled 2014-06-18: qty 1

## 2014-06-18 MED ORDER — HYDROMORPHONE HCL PF 1 MG/ML IJ SOLN
1.0000 mg | Freq: Once | INTRAMUSCULAR | Status: AC
  Administered 2014-06-18: 1 mg via INTRAVENOUS
  Filled 2014-06-18: qty 1

## 2014-06-18 MED ORDER — SODIUM CHLORIDE 0.9 % IV BOLUS (SEPSIS)
1000.0000 mL | Freq: Once | INTRAVENOUS | Status: AC
  Administered 2014-06-18: 1000 mL via INTRAVENOUS

## 2014-06-18 MED ORDER — HYDROMORPHONE HCL PF 1 MG/ML IJ SOLN
0.5000 mg | Freq: Once | INTRAMUSCULAR | Status: AC
  Administered 2014-06-18: 0.5 mg via INTRAVENOUS
  Filled 2014-06-18: qty 1

## 2014-06-18 NOTE — ED Notes (Signed)
Pt co migraine headache that started yesterday and is associated with nausea, has hx of migraines and states that this feels the same, has been taking vicodin 5/325mg  without any relief in headache,

## 2014-06-18 NOTE — ED Provider Notes (Signed)
CSN: [id number]     Arrival date & time 06/18/14  0160 History   First MD Initiated Contact with Patient 06/18/14 [phone]     Chief Complaint  Patient presents with  . Migraine     (Consider location/radiation/quality/duration/timing/severity/associated sxs/prior Treatment) Patient is a 63 y.o. female presenting with migraines.  Migraine Associated symptoms include headaches and nausea. Pertinent negatives include no abdominal pain, arthralgias, chest pain, congestion, fever, joint swelling, neck pain, numbness, rash, sore throat, vomiting or weakness.     Jacqueline [name] is a 63 y.o. female presenting with migraine headache which started gradually yesterday afternoon.  The patient has a history of migraine and the current symptoms are similar to previous episodes of migraine headache. The patients symptoms were not preceded by prodromal symptoms.  The patient has right sided pain frontal pain in association with nausea without emesis. Additionally she has phonophobia and photophobia which is consistent with prior episodes of migraine.  There has been no fevers, chills, syncope, confusion or localized weakness.  The patient has tried prednisone 50 mg PO and benadryl and a hydrocodone tablet around 4 am today without relief of symptoms.     Past Medical History  Diagnosis Date  . Gastric nodule 2009    EUS, ?leiomyoma  . Irritable bowel syndrome   . History of pancreatitis     Biliary and/or etoh?  Marland Kitchen Anxiety   . Chronic back pain   . GERD (gastroesophageal reflux disease)   . Migraines   . 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 Peripheral neuropathy   . Knee pain    Past Surgical History  Procedure Laterality Date  . Back surgery /ray cage fusion comberg, gso    . Toe graft    . Cesarean section    . 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  . Tonsillectomy    . Abdominal hysterectomy    . Dilation and curettage of uterus    . Gallbladder surgery  2010  . Eus  12/2008    Dr. Paulita Fujita. Gastric nodule most consistent with Leiomyoma. Slightly thickened and irregular gallbladder wall, possibly due to sludge or diminutive stones. Recommend EUS in January 2011 to followup gastric nodule.  . Esophagogastroduodenoscopy  11/2008    A 9-mm submucosal lesion seen in the cardia., gastritis, no hpylori  . Colonoscopy  2001    Dr. Laural Golden, hemorrhoids  . Cholecystectomy    . Colonoscopy  10/21/2011    ATF:[id number] polyp multiple/internal hemorrhoids  . Savory dilation  02/03/2012    RKY:[id number] in the distal esophagus/Mild gastritis  . Back surgery    . Abdominal surgery    . Foot surgery     Family History  Problem Relation Age of Onset  . Colon cancer Mother     diagnosed in late 6s and died age 7  . Heart failure Mother   . Heart defect      family history   . Arthritis      family history  . COPD      family history   . Cancer      multiple unknown type  . Heart attack Father     deceased at 54  . Hypertension Father   .  Heart failure Father   . Lung cancer Maternal Uncle   . Throat cancer Maternal Uncle   . Colon cancer Paternal Uncle   . Colon cancer Maternal Aunt   . Colon cancer Other   . Anesthesia problems Neg Hx   . Hypotension Neg Hx   . Malignant hyperthermia Neg Hx   . Pseudochol deficiency Neg Hx    History  Substance Use Topics  . Smoking status: Current Every Day Smoker -- 0.50 packs/day for 35 years    Types: Cigarettes  . Smokeless tobacco: Never Used  . Alcohol Use: Yes     Comment: occasional holiday drink   OB History   Grav Para Term Preterm Abortions TAB SAB Ect Mult Living   3 2 2  1  1   2      Review of Systems  Constitutional: Negative for fever.  HENT: Negative  for congestion, hearing loss and sore throat.        Phonophobia  Eyes: Positive for photophobia.  Respiratory: Negative for chest tightness and shortness of breath.   Cardiovascular: Negative for chest pain.  Gastrointestinal: Positive for nausea. Negative for vomiting and abdominal pain.  Genitourinary: Negative.   Musculoskeletal: Negative for arthralgias, joint swelling and neck pain.  Skin: Negative.  Negative for rash and wound.  Neurological: Positive for headaches. Negative for dizziness, speech difficulty, weakness, light-headedness and numbness.  Psychiatric/Behavioral: Negative.       Allergies  Aspirin; Imitrex; Ketorolac tromethamine; Nsaids; Promethazine hcl; and Sulfonamide derivatives  Home Medications   Prior to Admission medications   Medication Sig Start Date End Date Taking? Authorizing Provider  gabapentin (NEURONTIN) 300 MG capsule Take 600 mg by mouth 3 (three) times daily.   Yes Historical Provider, MD  HYDROcodone-acetaminophen (NORCO/VICODIN) 5-325 MG per tablet Take 1 tablet by mouth every 4 (four) hours as needed for moderate pain. 03/30/14  Yes Evalee Jefferson, PA-C  ibuprofen (ADVIL,MOTRIN) 200 MG tablet Take 400-800 mg by mouth every 8 (eight) hours as needed for moderate pain.   Yes Historical Provider, MD  methocarbamol (ROBAXIN) 750 MG tablet Take 750 mg by mouth 4 (four) times daily.   Yes Historical Provider, MD  omeprazole (PRILOSEC) 40 MG capsule Take 40 mg by mouth 2 (two) times daily before a meal.   Yes Historical Provider, MD   BP 161/98  Pulse 62  Temp(Src) 97.8 F (36.6 C) (Oral)  Resp 16  Ht 5\' 2"  (1.575 m)  Wt 116 lb (52.617 kg)  BMI 21.21 kg/m2  SpO2 100% Physical Exam  Nursing note and vitals reviewed. Constitutional: She is oriented to person, place, and time. She appears well-developed and well-nourished.  Uncomfortable appearing. Wearing sunglasses in darkened room.  HENT:  Head: Normocephalic and atraumatic.  Mouth/Throat:  Oropharynx is clear and moist.  Eyes: EOM are normal. Pupils are equal, round, and reactive to light.  Neck: Normal range of motion. Neck supple.  Cardiovascular: Normal rate and normal heart sounds.   Pulmonary/Chest: Effort normal.  Abdominal: Soft. There is no tenderness.  Musculoskeletal: Normal range of motion.  Lymphadenopathy:    She has no cervical adenopathy.  Neurological: She is alert and oriented to person, place, and time. She has normal strength. No sensory deficit. Gait normal. GCS eye subscore is 4. GCS verbal subscore is 5. GCS motor subscore is 6.  Normal heel-shin, normal rapid alternating movements. Cranial nerves III-XII intact.  No pronator drift.  Skin: Skin is warm and dry. No rash noted.  Psychiatric:  She has a normal mood and affect. Her speech is normal and behavior is normal. Thought content normal. Cognition and memory are normal.    ED Course  Procedures (including critical care time)  Pt was given NS 500 cc bolus, dilaudid 0.5 mg, reglan 10 mg, benadryl 12.5 mg.  Headache improved from 8 to 6/10.  No further nausea.  Will repeat dilaudid, attempt PO challenge. Labs Review Labs Reviewed - No data to display  Imaging Review No results found.   EKG Interpretation None      MDM   Final diagnoses:  Migraine without aura and without status migrainosus, not intractable    Pt with migraine history with current sx similar to prior migraine episodes.  No focal findings on exam to suggest acute intracranial process.    The patient appears reasonably screened and/or stabilized for discharge and I doubt any other medical condition or other [facility] requiring further screening, evaluation, or treatment in the ED at this time prior to discharge.     Evalee Jefferson, PA-C 06/18/14 1007

## 2014-06-18 NOTE — Discharge Instructions (Signed)
Migraine Headache A migraine headache is very bad, throbbing pain on one or both sides of your head. Talk to your doctor about what things may bring on (trigger) your migraine headaches. HOME CARE  Only take medicines as told by your doctor.  Lie down in a dark, quiet room when you have a migraine.  Keep a journal to find out if certain things bring on migraine headaches. For example, write down:  What you eat and drink.  How much sleep you get.  Any change to your diet or medicines.  Lessen how much alcohol you drink.  Quit smoking if you smoke.  Get enough sleep.  Lessen any stress in your life.  Keep lights dim if bright lights bother you or make your migraines worse. GET HELP RIGHT AWAY IF:   Your migraine becomes really bad.  You have a fever.  You have a stiff neck.  You have trouble seeing.  Your muscles are weak, or you lose muscle control.  You lose your balance or have trouble walking.  You feel like you will pass out (faint), or you pass out.  You have really bad symptoms that are different than your first symptoms. MAKE SURE YOU:   Understand these instructions.  Will watch your condition.  Will get help right away if you are not doing well or get worse. Document Released: 09/16/2008 Document Revised: 03/01/2012 Document Reviewed: 08/15/2013 [facility] Patient Information 2015 [place], [place]. This information is not intended to replace advice given to you by your health care provider. Make sure you discuss any questions you have with your health care provider.   Go home and rest.  Maintain increased fluid intake.  Plan followup with your doctor if your symptoms persist.  Do not drive within the next 4 hours as you have received narcotic injections today.

## 2014-06-21 NOTE — ED Provider Notes (Signed)
Medical screening examination/treatment/procedure(s) were performed by non-physician practitioner and as supervising physician I was immediately available for consultation/collaboration.   EKG Interpretation None        Tanna Furry, MD 06/21/14 2319

## 2014-06-23 ENCOUNTER — Emergency Department (HOSPITAL_COMMUNITY)
Admission: EM | Admit: 2014-06-23 | Discharge: 2014-06-23 | Disposition: A | Discharge status: Home / Self Care | Payer: Medicaid Other | Attending: Emergency Medicine | Admitting: Emergency Medicine

## 2014-06-23 ENCOUNTER — Encounter (HOSPITAL_COMMUNITY): Payer: Self-pay | Admitting: Emergency Medicine

## 2014-06-23 ENCOUNTER — Emergency Department (HOSPITAL_COMMUNITY): Payer: Medicaid Other

## 2014-06-23 DIAGNOSIS — M65849 Other synovitis and tenosynovitis, unspecified hand: Principal | ICD-10-CM

## 2014-06-23 DIAGNOSIS — G43909 Migraine, unspecified, not intractable, without status migrainosus: Secondary | ICD-10-CM | POA: Insufficient documentation

## 2014-06-23 DIAGNOSIS — Z792 Long term (current) use of antibiotics: Secondary | ICD-10-CM | POA: Diagnosis not present

## 2014-06-23 DIAGNOSIS — K219 Gastro-esophageal reflux disease without esophagitis: Secondary | ICD-10-CM | POA: Diagnosis not present

## 2014-06-23 DIAGNOSIS — Z791 Long term (current) use of non-steroidal anti-inflammatories (NSAID): Secondary | ICD-10-CM | POA: Insufficient documentation

## 2014-06-23 DIAGNOSIS — G609 Hereditary and idiopathic neuropathy, unspecified: Secondary | ICD-10-CM | POA: Diagnosis not present

## 2014-06-23 DIAGNOSIS — F411 Generalized anxiety disorder: Secondary | ICD-10-CM | POA: Diagnosis not present

## 2014-06-23 DIAGNOSIS — G8929 Other chronic pain: Secondary | ICD-10-CM | POA: Diagnosis not present

## 2014-06-23 DIAGNOSIS — Z79899 Other long term (current) drug therapy: Secondary | ICD-10-CM | POA: Diagnosis not present

## 2014-06-23 DIAGNOSIS — F17200 Nicotine dependence, unspecified, uncomplicated: Secondary | ICD-10-CM | POA: Insufficient documentation

## 2014-06-23 DIAGNOSIS — M778 Other enthesopathies, not elsewhere classified: Secondary | ICD-10-CM

## 2014-06-23 DIAGNOSIS — M65839 Other synovitis and tenosynovitis, unspecified forearm: Secondary | ICD-10-CM | POA: Insufficient documentation

## 2014-06-23 DIAGNOSIS — M25539 Pain in unspecified wrist: Secondary | ICD-10-CM | POA: Diagnosis present

## 2014-06-23 MED ORDER — OXYCODONE-ACETAMINOPHEN 5-325 MG PO TABS
1.0000 | ORAL_TABLET | Freq: Once | ORAL | Status: AC
  Administered 2014-06-23: 1 via ORAL
  Filled 2014-06-23: qty 1

## 2014-06-23 MED ORDER — OXYCODONE-ACETAMINOPHEN 5-325 MG PO TABS: 1.0000 | ORAL_TABLET | ORAL | Status: DC | PRN

## 2014-06-23 MED ORDER — DEXAMETHASONE SODIUM PHOSPHATE 4 MG/ML IJ SOLN
8.0000 mg | Freq: Once | INTRAMUSCULAR | Status: AC
Start: 2014-06-23 — End: 2014-06-23
  Administered 2014-06-23: 8 mg via INTRAMUSCULAR
  Filled 2014-06-23: qty 2

## 2014-06-23 NOTE — ED Notes (Signed)
Pain , redness , swelling, rt wrist,  Pulled weeds yesterday.

## 2014-06-23 NOTE — ED Provider Notes (Signed)
CSN: [id number]     Arrival date & time 06/23/14  1659 History   First MD Initiated Contact with Patient 06/23/14 1708     Chief Complaint  Patient presents with  . Wrist Pain     (Consider location/radiation/quality/duration/timing/severity/associated sxs/prior Treatment) HPI   Jacqueline [name] is a 63 y.o. female presenting with pain, redness and swelling along her dorsal right wrist which she woke with today.  She describes was actively pulling weeks yesterday, but denies any obvious injury including no falls or skin trauma.  Her pain is constant but worsened with palpation and attempts to flex and extend her fingers or wrist.  She is currently taking clindamycin, on day 7 of a 10 day course for left lower extremity phlebitis.  She denies history of gout and has had no fevers or chills.  She has found no alleviators for her pain.      Past Medical History  Diagnosis Date  . Gastric nodule 2009    EUS, ?leiomyoma  . Irritable bowel syndrome   . History of pancreatitis     Biliary and/or etoh?  Marland Kitchen Anxiety   . Chronic back pain   . GERD (gastroesophageal reflux disease)   . Migraines   . 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 Peripheral neuropathy   . Knee pain    Past Surgical History  Procedure Laterality Date  . Back surgery /ray cage fusion comberg, gso    . Toe graft    . Cesarean section    . 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  . Tonsillectomy    . Abdominal hysterectomy    . Dilation and curettage of uterus    . Gallbladder surgery  2010  . Eus  12/2008    Dr. Paulita Fujita. Gastric nodule most consistent with Leiomyoma. Slightly thickened and irregular gallbladder wall, possibly due to sludge or diminutive stones.  Recommend EUS in January 2011 to followup gastric nodule.  . Esophagogastroduodenoscopy  11/2008    A 9-mm submucosal lesion seen in the cardia., gastritis, no hpylori  . Colonoscopy  2001    Dr. Laural Golden, hemorrhoids  . Cholecystectomy    . Colonoscopy  10/21/2011    WUJ:WJXBJYN polyp multiple/internal hemorrhoids  . Savory dilation  02/03/2012    WGN:[id number] in the distal esophagus/Mild gastritis  . Back surgery    . Abdominal surgery    . Foot surgery     Family History  Problem Relation Age of Onset  . Colon cancer Mother     diagnosed in late 23s and died age 98  . Heart failure Mother   . Heart defect      family history   . Arthritis      family history  . COPD      family history   . Cancer      multiple unknown type  . Heart attack Father     deceased at 25  . Hypertension Father   . Heart failure Father   . Lung cancer Maternal Uncle   . Throat cancer Maternal Uncle   . Colon cancer Paternal Uncle   . Colon cancer Maternal Aunt   . Colon cancer Other   . Anesthesia problems Neg  Hx   . Hypotension Neg Hx   . Malignant hyperthermia Neg Hx   . Pseudochol deficiency Neg Hx    History  Substance Use Topics  . Smoking status: Current Every Day Smoker -- 0.50 packs/day for 35 years    Types: Cigarettes  . Smokeless tobacco: Never Used  . Alcohol Use: Yes     Comment: occasional holiday drink   OB History   Grav Para Term Preterm Abortions TAB SAB Ect Mult Living   3 2 2  1  1   2      Review of Systems  Constitutional: Negative for fever and chills.  Musculoskeletal: Positive for arthralgias and joint swelling. Negative for myalgias.  Skin: Positive for color change. Negative for wound.  Neurological: Negative for weakness and numbness.      Allergies  Aspirin; Imitrex; Ketorolac tromethamine; Nsaids; Promethazine hcl; and Sulfonamide derivatives  Home Medications   Prior to Admission medications   Medication Sig Start Date End Date Taking?  Authorizing Provider  clindamycin (CLEOCIN) 150 MG capsule Take 300 mg by mouth 3 (three) times daily.   Yes Historical Provider, MD  gabapentin (NEURONTIN) 300 MG capsule Take 600 mg by mouth 3 (three) times daily.   Yes Historical Provider, MD  HYDROcodone-acetaminophen (NORCO/VICODIN) 5-325 MG per tablet Take 1 tablet by mouth 3 (three) times daily.   Yes Historical Provider, MD  ibuprofen (ADVIL,MOTRIN) 200 MG tablet Take 400-800 mg by mouth every 8 (eight) hours as needed for moderate pain.   Yes Historical Provider, MD  methocarbamol (ROBAXIN) 750 MG tablet Take 750 mg by mouth 4 (four) times daily.   Yes Historical Provider, MD  omeprazole (PRILOSEC) 40 MG capsule Take 40 mg by mouth 2 (two) times daily before a meal.   Yes Historical Provider, MD  oxyCODONE-acetaminophen (PERCOCET/ROXICET) 5-325 MG per tablet Take 1 tablet by mouth every 4 (four) hours as needed. 06/23/14   Evalee Jefferson, PA-C   BP 133/75  Pulse 88  Temp(Src) 98.2 F (36.8 C) (Oral)  Resp 20  Ht 5\' 2"  (1.575 m)  Wt 114 lb (51.71 kg)  BMI 20.85 kg/m2  SpO2 96% Physical Exam  Constitutional: She appears well-developed and well-nourished.  HENT:  Head: Atraumatic.  Neck: Normal range of motion.  Cardiovascular:  Pulses equal bilaterally  Musculoskeletal: She exhibits edema and tenderness.       Right wrist: She exhibits tenderness and swelling.  Localized erythema and edema over mid right dorsal wrist and a small area over the lateral wrist.  No punctures,  Skin is intact.  TTP.  Pain is worsened with attempts to flex and extend her fingers.  No red streaking,  No pain in forearm.  Neurological: She is alert. She has normal strength. She displays normal reflexes. No sensory deficit.  Skin: Skin is warm and dry.  Psychiatric: She has a normal mood and affect.    ED Course  Procedures (including critical care time) Labs Review Labs Reviewed - No data to display  Imaging Review Dg Wrist Complete Right  06/23/2014    CLINICAL DATA:  Pain and swelling.  Radial side redness.  EXAM: RIGHT WRIST - COMPLETE 3+ VIEW  COMPARISON:  12/24/2006  FINDINGS: There is advanced degenerative arthropathy at the first carpometacarpal joint, largely developed since the previous films of 2008. Chronic calcifications in the triangular fibrocartilage appear quite similar. No acute fracture.  IMPRESSION: Advanced degenerative arthropathy of the first carpometacarpal joint, largely developed since the previous studies of 2008.  Electronically Signed   By: Nelson Chimes M.D.   On: 06/23/2014 17:45     EKG Interpretation None      MDM   Final diagnoses:  Tendonitis of wrist, right    Patients labs and/or radiological studies were viewed and considered during the medical decision making and disposition process.  xrays reviewed.  The point of most pain is not at her 1st carp/metacarpal joint.  Pt with erythema and edema of the right dorsal wrist after strenuous yard work yesterday.  This could be inflammatory/tendonitis.  Does not appear to be infectious, although she is already taking clindamycin which should cover for this possibility.  No history of gout.  She cannot tolerate nsaids secondary to partial gastrectomy history.  Will cover patient with steroid injection, pain medicine.  Wrist splint.  Plan f/u with pcp this week.        Evalee Jefferson, PA-C 06/23/14 [phone]

## 2014-06-23 NOTE — Discharge Instructions (Signed)
Tendinitis Tendinitis is swelling and inflammation of the tendons. Tendons are band-like tissues that connect muscle to bone. Tendinitis commonly occurs in the:   Shoulders (rotator cuff).  Heels (Achilles tendon).  Elbows (triceps tendon). CAUSES Tendinitis is usually caused by overusing the tendon, muscles, and joints involved. When the tissue surrounding a tendon (synovium) becomes inflamed, it is called tenosynovitis. Tendinitis commonly develops in people whose jobs require repetitive motions. SYMPTOMS  Pain.  Tenderness.  Mild swelling. DIAGNOSIS Tendinitis is usually diagnosed by physical exam. Your health care provider may also order X-rays or other imaging tests. TREATMENT Your health care provider may recommend certain medicines or exercises for your treatment. HOME CARE INSTRUCTIONS   Use a sling or splint for as long as directed by your health care provider until the pain decreases.  Put ice on the injured area.  Put ice in a plastic bag.  Place a towel between your skin and the bag.  Leave the ice on for 15-20 minutes, 3-4 times a day, or as directed by your health care provider.  Avoid using the limb while the tendon is painful. Perform gentle range of motion exercises only as directed by your health care provider. Stop exercises if pain or discomfort increase, unless directed otherwise by your health care provider.  Only take over-the-counter or prescription medicines for pain, discomfort, or fever as directed by your health care provider. SEEK MEDICAL CARE IF:   Your pain and swelling increase.  You develop new, unexplained symptoms, especially increased numbness in the hands. MAKE SURE YOU:   Understand these instructions.  Will watch your condition.  Will get help right away if you are not doing well or get worse. Document Released: 12/05/2000 Document Revised: 12/13/2013 Document Reviewed: 02/24/2011 [facility] Patient Information 2015 [place],  [place]. This information is not intended to replace advice given to you by your health care provider. Make sure you discuss any questions you have with your health care provider.    You may take the oxycodone prescribed for pain relief.  This will make you drowsy - do not drive within 4 hours of taking this medication. Wear the wrist splint for comfort. Apply ice to your wrist for for 20 minutes 4 times daily.  Elevation your wrist.  Take ibuprofen 200 mg three times daily (every 8 hours) 30 minutes after eating a meal or snack.

## 2014-06-23 NOTE — ED Provider Notes (Signed)
  This was a shared visit with a mid-level provided (NP or PA).  Throughout the patient's course I was available for consultation/collaboration.  I saw the ECG (if appropriate), relevant labs and studies - I agree with the interpretation.  On my exam the patient was in no distress.She had swelling about the wrist, though preserved distal neurovascular function, and no pain with passive motion. Patient's presentation is most consistent with soft tissue injury.  Patient was started on Provera therapy, anti-inflammatories, will followup with specialists      Carmin Muskrat, MD 06/23/14 2350

## 2014-06-23 NOTE — ED Notes (Signed)
Woke this morning with swelling  And redness of R wrist and base of R hand.  States she was pulling weeds yesterday, but doesn't remember any poison ivy or oak or getting bitten by any insects.  Hand is stiff and swollen, limited mobility of fingers.

## 2014-08-11 ENCOUNTER — Encounter (HOSPITAL_COMMUNITY): Payer: Self-pay | Admitting: Emergency Medicine

## 2014-08-11 ENCOUNTER — Emergency Department (HOSPITAL_COMMUNITY): Payer: Medicaid Other

## 2014-08-11 ENCOUNTER — Emergency Department (HOSPITAL_COMMUNITY)
Admission: EM | Admit: 2014-08-11 | Discharge: 2014-08-11 | Disposition: A | Discharge status: Home / Self Care | Payer: Medicaid Other | Attending: Emergency Medicine | Admitting: Emergency Medicine

## 2014-08-11 DIAGNOSIS — F411 Generalized anxiety disorder: Secondary | ICD-10-CM | POA: Insufficient documentation

## 2014-08-11 DIAGNOSIS — F17200 Nicotine dependence, unspecified, uncomplicated: Secondary | ICD-10-CM | POA: Insufficient documentation

## 2014-08-11 DIAGNOSIS — M25579 Pain in unspecified ankle and joints of unspecified foot: Secondary | ICD-10-CM | POA: Insufficient documentation

## 2014-08-11 DIAGNOSIS — M25572 Pain in left ankle and joints of left foot: Secondary | ICD-10-CM

## 2014-08-11 DIAGNOSIS — G8929 Other chronic pain: Secondary | ICD-10-CM | POA: Insufficient documentation

## 2014-08-11 DIAGNOSIS — K219 Gastro-esophageal reflux disease without esophagitis: Secondary | ICD-10-CM | POA: Diagnosis not present

## 2014-08-11 DIAGNOSIS — Z8679 Personal history of other diseases of the circulatory system: Secondary | ICD-10-CM | POA: Diagnosis not present

## 2014-08-11 DIAGNOSIS — Z8669 Personal history of other diseases of the nervous system and sense organs: Secondary | ICD-10-CM | POA: Diagnosis not present

## 2014-08-11 DIAGNOSIS — Z79899 Other long term (current) drug therapy: Secondary | ICD-10-CM | POA: Insufficient documentation

## 2014-08-11 MED ORDER — OXYCODONE-ACETAMINOPHEN 5-325 MG PO TABS
2.0000 | ORAL_TABLET | Freq: Four times a day (QID) | ORAL | Status: DC | PRN
Start: 2014-08-11 — End: 2014-09-10

## 2014-08-11 MED ORDER — OXYCODONE-ACETAMINOPHEN 5-325 MG PO TABS
2.0000 | ORAL_TABLET | Freq: Once | ORAL | Status: AC
  Administered 2014-08-11: 2 via ORAL
  Filled 2014-08-11: qty 2

## 2014-08-11 MED ORDER — ONDANSETRON 8 MG PO TBDP
8.0000 mg | ORAL_TABLET | Freq: Once | ORAL | Status: AC
  Administered 2014-08-11: 8 mg via ORAL
  Filled 2014-08-11: qty 1

## 2014-08-11 NOTE — ED Notes (Signed)
Patient with no complaints at this time. Respirations even and unlabored. Skin warm/dry. Discharge instructions reviewed with patient at this time. Patient given opportunity to voice concerns/ask questions. Patient discharged at this time and left Emergency Department with steady gait.   

## 2014-08-11 NOTE — ED Provider Notes (Signed)
CSN: [id number]     Arrival date & time 08/11/14  1249 History   First MD Initiated Contact with Patient 08/11/14 1321     Chief Complaint  Patient presents with  . Ankle Pain     (Consider location/radiation/quality/duration/timing/severity/associated sxs/prior Treatment) HPI Comments: Patient is a 63 year old female history of a IBS, pancreatitis, chronic back pain, gastric tumor who presents to the emergency department with left leg swelling. She reports that the pain and swelling began 2 days ago, but has gradually been worsening. She has no known injury to the area. She describes the pain as aching. The pain is worse with movement, palpation, standing. She has tried heat and ice without relief of her symptoms.  The history is provided by the patient. No language interpreter was used.    Past Medical History  Diagnosis Date  . Gastric nodule 2009    EUS, ?leiomyoma  . Irritable bowel syndrome   . History of pancreatitis     Biliary and/or etoh?  Marland Kitchen Anxiety   . Chronic back pain   . GERD (gastroesophageal reflux disease)   . Migraines   . 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 Peripheral neuropathy   . Knee pain    Past Surgical History  Procedure Laterality Date  . Back surgery /ray cage fusion comberg, gso    . Toe graft    . Cesarean section    . 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  . Tonsillectomy    . Abdominal hysterectomy    . Dilation and curettage of uterus    . Gallbladder surgery  2010  . Eus  12/2008    Dr. Paulita Fujita. Gastric nodule most consistent with Leiomyoma. Slightly thickened and irregular gallbladder wall, possibly due to sludge or diminutive stones. Recommend EUS in January 2011 to followup gastric  nodule.  . Esophagogastroduodenoscopy  11/2008    A 9-mm submucosal lesion seen in the cardia., gastritis, no hpylori  . Colonoscopy  2001    Dr. Laural Golden, hemorrhoids  . Cholecystectomy    . Colonoscopy  10/21/2011    UMP:NTIRWER polyp multiple/internal hemorrhoids  . Savory dilation  02/03/2012    XVQ:[id number] in the distal esophagus/Mild gastritis  . Back surgery    . Abdominal surgery    . Foot surgery     Family History  Problem Relation Age of Onset  . Colon cancer Mother     diagnosed in late 54s and died age 51  . Heart failure Mother   . Heart defect      family history   . Arthritis      family history  . COPD      family history   . Cancer      multiple unknown type  . Heart attack Father     deceased at 23  . Hypertension Father   . Heart failure Father   . Lung cancer Maternal Uncle   . Throat cancer Maternal Uncle   . Colon cancer Paternal Uncle   . Colon cancer Maternal Aunt   . Colon cancer Other   . Anesthesia problems Neg Hx   . Hypotension Neg Hx   . Malignant hyperthermia Neg Hx   . Pseudochol deficiency  Neg Hx    History  Substance Use Topics  . Smoking status: Current Every Day Smoker -- 0.50 packs/day for 35 years    Types: Cigarettes  . Smokeless tobacco: Never Used  . Alcohol Use: Yes     Comment: occasional holiday drink   OB History   Grav Para Term Preterm Abortions TAB SAB Ect Mult Living   3 2 2  1  1   2      Review of Systems  Constitutional: Negative for fever and chills.  Respiratory: Negative for shortness of breath.   Cardiovascular: Negative for chest pain.  Gastrointestinal: Negative for nausea and vomiting.  Musculoskeletal: Positive for arthralgias, gait problem, joint swelling and myalgias.  All other systems reviewed and are negative.     Allergies  Aspirin; Imitrex; Ketorolac tromethamine; Nsaids; Promethazine hcl; and Sulfonamide derivatives  Home Medications   Prior to Admission medications   Medication  Sig Start Date End Date Taking? Authorizing Provider  gabapentin (NEURONTIN) 300 MG capsule Take 600 mg by mouth 3 (three) times daily.   Yes Historical Provider, MD  HYDROcodone-acetaminophen (NORCO/VICODIN) 5-325 MG per tablet Take 1 tablet by mouth 3 (three) times daily.   Yes Historical Provider, MD  ibuprofen (ADVIL,MOTRIN) 200 MG tablet Take 400-800 mg by mouth every 8 (eight) hours as needed for moderate pain.   Yes Historical Provider, MD  methocarbamol (ROBAXIN) 750 MG tablet Take 750 mg by mouth 4 (four) times daily.   Yes Historical Provider, MD  omeprazole (PRILOSEC) 40 MG capsule Take 40 mg by mouth 2 (two) times daily before a meal.   Yes Historical Provider, MD   BP 115/74  Pulse 71  Temp(Src) 99 F (37.2 C) (Oral)  Resp 18  Ht 5\' 2"  (1.575 m)  Wt 114 lb (51.71 kg)  BMI 20.85 kg/m2  SpO2 100% Physical Exam  Nursing note and vitals reviewed. Constitutional: She is oriented to person, place, and time. She appears well-developed and well-nourished. No distress.  HENT:  Head: Normocephalic and atraumatic.  Right Ear: External ear normal.  Left Ear: External ear normal.  Nose: Nose normal.  Mouth/Throat: Oropharynx is clear and moist.  Eyes: Conjunctivae are normal.  Neck: Normal range of motion.  Cardiovascular: Normal rate, regular rhythm, normal heart sounds, intact distal pulses and normal pulses.   Pulses:      Posterior tibial pulses are 2+ on the right side, and 2+ on the left side.  Pulmonary/Chest: Effort normal and breath sounds normal. No stridor. No respiratory distress. She has no wheezes. She has no rales.  Abdominal: Soft. She exhibits no distension.  Musculoskeletal: Normal range of motion.  Swelling to left ankle. Exquisitely tender to palpation.  Varicose veins throughout left leg. No erythema or bruising.   Neurological: She is alert and oriented to person, place, and time. She has normal strength.  Skin: Skin is warm and dry. She is not diaphoretic.  No erythema.  Psychiatric: She has a normal mood and affect. Her behavior is normal.    ED Course  Procedures (including critical care time) Labs Review Labs Reviewed - No data to display  Imaging Review Dg Ankle Complete Left  08/11/2014   CLINICAL DATA:  Left ankle pain  EXAM: LEFT ANKLE COMPLETE - 3+ VIEW  COMPARISON:  None.  FINDINGS: Three views of left ankle submitted. No acute fracture or subluxation. Ankle mortise is preserved.  IMPRESSION: Negative.   Electronically Signed   By: Lahoma Crocker M.D.   On:  08/11/2014 13:32   US Venous Img Lower Unilateral Left  08/11/2014   CLINICAL DATA:  Left lower extremity pain and edema.  EXAM: LEFT LOWER EXTREMITY VENOUS DOPPLER ULTRASOUND  TECHNIQUE: Gray-scale sonography with graded compression, as well as color Doppler and duplex ultrasound were performed to evaluate the lower extremity deep venous systems from the level of the common femoral vein and including the common femoral, femoral, profunda femoral, popliteal and calf veins including the posterior tibial, peroneal and gastrocnemius veins when visible. The superficial great saphenous vein was also interrogated. Spectral Doppler was utilized to evaluate flow at rest and with distal augmentation maneuvers in the common femoral, femoral and popliteal veins.  COMPARISON:  None.  FINDINGS: Common Femoral Vein: No evidence of thrombus. Normal compressibility, respiratory phasicity and response to augmentation.  Saphenofemoral Junction: No evidence of thrombus. Normal compressibility and flow on color Doppler imaging.  Profunda Femoral Vein: No evidence of thrombus. Normal compressibility and flow on color Doppler imaging.  Femoral Vein: No evidence of thrombus. Normal compressibility, respiratory phasicity and response to augmentation.  Popliteal Vein: No evidence of thrombus. Normal compressibility, respiratory phasicity and response to augmentation.  Calf Veins: No evidence of thrombus. Normal  compressibility and flow on color Doppler imaging.  Superficial Great Saphenous Vein: No evidence of thrombus. Normal compressibility and flow on color Doppler imaging.  Venous Reflux:  None.  Other Findings: No evidence of superficial thrombophlebitis or abnormal fluid collection.  IMPRESSION: No evidence of left lower extremity deep venous thrombosis.   Electronically Signed   By: Aletta Edouard M.D.   On: 08/11/2014 14:30     EKG Interpretation None      MDM   Final diagnoses:  Left ankle pain    Patient presents to ED with left ankle pain and swelling. Exquisitely tender to palpation. XR and [place] are negative for acute pathology. No DVT. Patient possibly with gout. Will treat with narcotic pain medication. Discussed no driving, operating heavy machinery, or combing with alcohol. Patient has appointment with PCP on Monday. Discussed reasons to return to ED immediately. Vital signs stable for discharge. Patient / Family / Caregiver informed of clinical course, understand medical decision-making process, and agree with plan.   Elwyn Lade, PA-C 08/11/14 1525

## 2014-08-11 NOTE — ED Notes (Signed)
Swelling and pain to left ankle x 2 days.  Denies injury.

## 2014-08-11 NOTE — Discharge Instructions (Signed)

## 2014-08-14 NOTE — ED Provider Notes (Signed)
Medical screening examination/treatment/procedure(s) were performed by non-physician practitioner and as supervising physician I was immediately available for consultation/collaboration.   EKG Interpretation None        Maudry Diego, MD 08/14/14 1701

## 2014-08-17 ENCOUNTER — Encounter (HOSPITAL_COMMUNITY): Payer: Self-pay | Admitting: Emergency Medicine

## 2014-08-17 ENCOUNTER — Emergency Department (HOSPITAL_COMMUNITY)
Admission: EM | Admit: 2014-08-17 | Discharge: 2014-08-17 | Disposition: A | Discharge status: Home / Self Care | Payer: Medicaid Other | Attending: Emergency Medicine | Admitting: Emergency Medicine

## 2014-08-17 DIAGNOSIS — G609 Hereditary and idiopathic neuropathy, unspecified: Secondary | ICD-10-CM | POA: Insufficient documentation

## 2014-08-17 DIAGNOSIS — M25579 Pain in unspecified ankle and joints of unspecified foot: Secondary | ICD-10-CM | POA: Insufficient documentation

## 2014-08-17 DIAGNOSIS — F411 Generalized anxiety disorder: Secondary | ICD-10-CM | POA: Insufficient documentation

## 2014-08-17 DIAGNOSIS — M25569 Pain in unspecified knee: Secondary | ICD-10-CM | POA: Insufficient documentation

## 2014-08-17 DIAGNOSIS — Z79899 Other long term (current) drug therapy: Secondary | ICD-10-CM | POA: Insufficient documentation

## 2014-08-17 DIAGNOSIS — K219 Gastro-esophageal reflux disease without esophagitis: Secondary | ICD-10-CM | POA: Insufficient documentation

## 2014-08-17 DIAGNOSIS — G43909 Migraine, unspecified, not intractable, without status migrainosus: Secondary | ICD-10-CM | POA: Insufficient documentation

## 2014-08-17 DIAGNOSIS — F17200 Nicotine dependence, unspecified, uncomplicated: Secondary | ICD-10-CM | POA: Insufficient documentation

## 2014-08-17 DIAGNOSIS — G8929 Other chronic pain: Secondary | ICD-10-CM | POA: Insufficient documentation

## 2014-08-17 DIAGNOSIS — L089 Local infection of the skin and subcutaneous tissue, unspecified: Secondary | ICD-10-CM | POA: Insufficient documentation

## 2014-08-17 DIAGNOSIS — M25572 Pain in left ankle and joints of left foot: Secondary | ICD-10-CM

## 2014-08-17 MED ORDER — HYDROCODONE-ACETAMINOPHEN 5-325 MG PO TABS
1.0000 | ORAL_TABLET | Freq: Once | ORAL | Status: AC
  Administered 2014-08-17: 1 via ORAL
  Filled 2014-08-17: qty 1

## 2014-08-17 MED ORDER — PREDNISONE 10 MG PO TABS: ORAL_TABLET | ORAL | Status: DC

## 2014-08-17 MED ORDER — DEXAMETHASONE SODIUM PHOSPHATE 4 MG/ML IJ SOLN
8.0000 mg | Freq: Once | INTRAMUSCULAR | Status: AC
  Administered 2014-08-17: 8 mg via INTRAMUSCULAR
  Filled 2014-08-17: qty 2

## 2014-08-17 MED ORDER — OXYCODONE-ACETAMINOPHEN 5-325 MG PO TABS: 1.0000 | ORAL_TABLET | ORAL | Status: DC | PRN

## 2014-08-17 MED ORDER — DOXYCYCLINE HYCLATE 100 MG PO CAPS: 100.0000 mg | ORAL_CAPSULE | Freq: Two times a day (BID) | ORAL | Status: DC

## 2014-08-17 NOTE — ED Notes (Addendum)
Pt reports was diagnosed with gout in left ankle Friday of last week.  Pt says is no better. Also has a sore on tip of her nose that she wants evaluated.  Left ankle and foot swollen, pedal pulse present.

## 2014-08-17 NOTE — ED Provider Notes (Signed)
Medical screening examination/treatment/procedure(s) were performed by non-physician practitioner and as supervising physician I was immediately available for consultation/collaboration.   EKG Interpretation None      Rolland Porter, MD, Abram Sander   Janice Norrie, MD 08/17/14 [phone]

## 2014-08-17 NOTE — Discharge Instructions (Signed)
Ankle Pain Ankle pain is a common symptom. The bones, cartilage, tendons, and muscles of the ankle joint perform a lot of work each day. The ankle joint holds your body weight and allows you to move around. Ankle pain can occur on either side or back of 1 or both ankles. Ankle pain may be sharp and burning or dull and aching. There may be tenderness, stiffness, redness, or warmth around the ankle. The pain occurs more often when a person walks or puts pressure on the ankle. CAUSES  There are many reasons ankle pain can develop. It is important to work with your caregiver to identify the cause since many conditions can impact the bones, cartilage, muscles, and tendons. Causes for ankle pain include:  Injury, including a break (fracture), sprain, or strain often due to a fall, sports, or a high-impact activity.  Swelling (inflammation) of a tendon (tendonitis).  Achilles tendon rupture.  Ankle instability after repeated sprains and strains.  Poor foot alignment.  Pressure on a nerve (tarsal tunnel syndrome).  Arthritis in the ankle or the lining of the ankle.  Crystal formation in the ankle (gout or pseudogout). DIAGNOSIS  A diagnosis is based on your medical history, your symptoms, results of your physical exam, and results of diagnostic tests. Diagnostic tests may include X-ray exams or a computerized magnetic scan (magnetic resonance imaging, MRI). TREATMENT  Treatment will depend on the cause of your ankle pain and may include:  Keeping pressure off the ankle and limiting activities.  Using crutches or other walking support (a cane or brace).  Using rest, ice, compression, and elevation.  Participating in physical therapy or home exercises.  Wearing shoe inserts or special shoes.  Losing weight.  Taking medications to reduce pain or swelling or receiving an injection.  Undergoing surgery. HOME CARE INSTRUCTIONS   Only take over-the-counter or prescription medicines for  pain, discomfort, or fever as directed by your caregiver.  Put ice on the injured area.  Put ice in a plastic bag.  Place a towel between your skin and the bag.  Leave the ice on for 15-20 minutes at a time, 03-04 times a day.  Keep your leg raised (elevated) when possible to lessen swelling.  Avoid activities that cause ankle pain.  Follow specific exercises as directed by your caregiver.  Record how often you have ankle pain, the location of the pain, and what it feels like. This information may be helpful to you and your caregiver.  Ask your caregiver about returning to work or sports and whether you should drive.  Follow up with your caregiver for further examination, therapy, or testing as directed. SEEK MEDICAL CARE IF:   Pain or swelling continues or worsens beyond 1 week.  You have an oral temperature above 102 F (38.9 C).  You are feeling unwell or have chills.  You are having an increasingly difficult time with walking.  You have loss of sensation or other new symptoms.  You have questions or concerns. MAKE SURE YOU:   Understand these instructions.  Will watch your condition.  Will get help right away if you are not doing well or get worse. Document Released: 05/28/2010 Document Revised: 03/01/2012 Document Reviewed: 05/28/2010 [facility] Patient Information 2015 [place], [place]. This information is not intended to replace advice given to you by your health care provider. Make sure you discuss any questions you have with your health care provider.   Take your next dose of prednisone tomorrow morning.  Use warm compresses  on both your nose and you were ankle, elevation of ankle as much as possible.  Avoid squeezing this infection under nose which can make the infection worse.  Followup with your Dr. for recheck if your conditions worsen or fail to improve.

## 2014-08-17 NOTE — ED Notes (Signed)
Pt wanted to go outside. Offered her to sit on bench. Pt addiment she was going across the road to smoke with her crutches.

## 2014-08-17 NOTE — ED Provider Notes (Signed)
CSN: [id number]     Arrival date & time 08/17/14  0998 History   First MD Initiated Contact with Patient 08/17/14 0809     Chief Complaint  Patient presents with  . Ankle Pain     (Consider location/radiation/quality/duration/timing/severity/associated sxs/prior Treatment) The history is provided by the patient.    Jacqueline [name] is a 63 y.o. female  Presenting with persistent pain and swelling in her left ankle and now with mild pain in the left knee,  But believes this is due to her change in gait with the ankle problem.  She was seen here 1 week ago and xrays and venous doppler US performed were negative for acute bony injury and dvt.  She was treated for possible gout with percocet which has not resolved the pain.  She does not recall any injury to the joint, but has been more active recently with caring for grandchildren.  May have rolled the ankle, but cannot recall specifically doing this.  She does not have a personal or family history of gout.  Her pain is aching and constant.  She has pain with simply brushing her skin, but denies redness or warmth at the site.    Secondly,  She has a history of frequent facial breakouts and had a small pimple on her nose which has become more tender and drained clear fluid several days ago when she squeezed the site. She reports history of mrsa exposure.  She denies fevers or chills. She has taken no medicines for this condition.    Past Medical History  Diagnosis Date  . Gastric nodule 2009    EUS, ?leiomyoma  . Irritable bowel syndrome   . History of pancreatitis     Biliary and/or etoh?  Marland Kitchen Anxiety   . Chronic back pain   . GERD (gastroesophageal reflux disease)   . Migraines   . 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 Peripheral neuropathy   . Knee pain    Past Surgical History  Procedure Laterality Date  . Back surgery /ray cage fusion  comberg, gso    . Toe graft    . Cesarean section    . 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  . Tonsillectomy    . Abdominal hysterectomy    . Dilation and curettage of uterus    . Gallbladder surgery  2010  . Eus  12/2008    Dr. Paulita Fujita. Gastric nodule most consistent with Leiomyoma. Slightly thickened and irregular gallbladder wall, possibly due to sludge or diminutive stones. Recommend EUS in January 2011 to followup gastric nodule.  . Esophagogastroduodenoscopy  11/2008    A 9-mm submucosal lesion seen in the cardia., gastritis, no hpylori  . Colonoscopy  2001    Dr. Laural Golden, hemorrhoids  . Cholecystectomy    . Colonoscopy  10/21/2011    PJA:[id number] polyp multiple/internal hemorrhoids  . Savory dilation  02/03/2012    [id number]:[id number] in the distal esophagus/Mild gastritis  . Back surgery    . Abdominal surgery    . Foot surgery     Family History  Problem Relation Age of Onset  . Colon cancer Mother     diagnosed in late 75s and died age 56  . Heart  failure Mother   . Heart defect      family history   . Arthritis      family history  . COPD      family history   . Cancer      multiple unknown type  . Heart attack Father     deceased at 28  . Hypertension Father   . Heart failure Father   . Lung cancer Maternal Uncle   . Throat cancer Maternal Uncle   . Colon cancer Paternal Uncle   . Colon cancer Maternal Aunt   . Colon cancer Other   . Anesthesia problems Neg Hx   . Hypotension Neg Hx   . Malignant hyperthermia Neg Hx   . Pseudochol deficiency Neg Hx    History  Substance Use Topics  . Smoking status: Current Every Day Smoker -- 0.50 packs/day for 35 years    Types: Cigarettes  . Smokeless tobacco: Never Used  . Alcohol Use: Yes     Comment: occasional holiday drink   OB History   Grav Para Term  Preterm Abortions TAB SAB Ect Mult Living   3 2 2  1  1   2      Review of Systems  Constitutional: Negative for fever and chills.  HENT: Negative.   Cardiovascular: Negative.   Gastrointestinal: Negative.   Musculoskeletal: Positive for arthralgias and joint swelling.  Skin: Positive for wound. Negative for color change.  Neurological: Negative for weakness and numbness.      Allergies  Aspirin; Imitrex; Ketorolac tromethamine; Nsaids; Promethazine hcl; and Sulfonamide derivatives  Home Medications   Prior to Admission medications   Medication Sig Start Date End Date Taking? Authorizing Provider  gabapentin (NEURONTIN) 300 MG capsule Take 600 mg by mouth 3 (three) times daily.    Historical Provider, MD  HYDROcodone-acetaminophen (NORCO/VICODIN) 5-325 MG per tablet Take 1 tablet by mouth 3 (three) times daily.    Historical Provider, MD  ibuprofen (ADVIL,MOTRIN) 200 MG tablet Take 400-800 mg by mouth every 8 (eight) hours as needed for moderate pain.    Historical Provider, MD  methocarbamol (ROBAXIN) 750 MG tablet Take 750 mg by mouth 4 (four) times daily.    Historical Provider, MD  omeprazole (PRILOSEC) 40 MG capsule Take 40 mg by mouth 2 (two) times daily before a meal.    Historical Provider, MD  oxyCODONE-acetaminophen (PERCOCET/ROXICET) 5-325 MG per tablet Take 2 tablets by mouth every 6 (six) hours as needed for moderate pain or severe pain. 08/11/14   Elwyn Lade, PA-C   BP 126/85  Pulse 80  Temp(Src) 98.2 F (36.8 C) (Oral)  Resp 18  Ht 5\' 2"  (1.575 m)  Wt 120 lb (54.432 kg)  BMI 21.94 kg/m2  SpO2 99% Physical Exam  Nursing note and vitals reviewed. Constitutional: She appears well-developed and well-nourished.  HENT:  Head: Normocephalic.  Cardiovascular: Normal rate and intact distal pulses.  Exam reveals no decreased pulses.   Pulses:      Dorsalis pedis pulses are 2+ on the right side, and 2+ on the left side.  Musculoskeletal: She exhibits edema and  tenderness.       Left ankle: She exhibits decreased range of motion and swelling. She exhibits no ecchymosis, no deformity and normal pulse. Tenderness. Lateral malleolus and medial malleolus tenderness found. No head of 5th metatarsal and no proximal fibula tenderness found.  Neurological: She is alert. No sensory deficit.  Skin: Skin is warm, dry and intact.  Patient has  a proximate 3 mm scabbed lesion on her right distal nose with surrounding erythema.  There is no drainage, no fluctuance or induration.    ED Course  Procedures (including critical care time) Labs Review Labs Reviewed - No data to display  Imaging Review No results found.   EKG Interpretation None      MDM   Final diagnoses:  None    Pt with ankle edema and pain.  This could be an initial gouty flare, esp given tenderness to even slight palpation.  She cannot take nsaids, but does tolerate prednisone.  Decadron IM given here,  Will give 6 day taper.  Also will tx facial infection with doxycycline.  Warm compresses (ankle and nose).  Elevation of ankle.  Pt has crutches.  F/u with pcp and/or Dr Aline Brochure whom she already is established with.    Evalee Jefferson, PA-C 08/17/14 0900

## 2014-09-10 ENCOUNTER — Encounter (HOSPITAL_COMMUNITY): Payer: Self-pay | Admitting: Emergency Medicine

## 2014-09-10 ENCOUNTER — Emergency Department (HOSPITAL_COMMUNITY)
Admission: EM | Admit: 2014-09-10 | Discharge: 2014-09-10 | Disposition: A | Discharge status: Home / Self Care | Payer: Medicaid Other | Attending: Emergency Medicine | Admitting: Emergency Medicine

## 2014-09-10 DIAGNOSIS — M25572 Pain in left ankle and joints of left foot: Secondary | ICD-10-CM

## 2014-09-10 DIAGNOSIS — Z9889 Other specified postprocedural states: Secondary | ICD-10-CM | POA: Insufficient documentation

## 2014-09-10 DIAGNOSIS — F17200 Nicotine dependence, unspecified, uncomplicated: Secondary | ICD-10-CM | POA: Diagnosis not present

## 2014-09-10 DIAGNOSIS — G8929 Other chronic pain: Secondary | ICD-10-CM | POA: Diagnosis not present

## 2014-09-10 DIAGNOSIS — K219 Gastro-esophageal reflux disease without esophagitis: Secondary | ICD-10-CM | POA: Diagnosis not present

## 2014-09-10 DIAGNOSIS — M79609 Pain in unspecified limb: Secondary | ICD-10-CM | POA: Insufficient documentation

## 2014-09-10 DIAGNOSIS — Z792 Long term (current) use of antibiotics: Secondary | ICD-10-CM | POA: Diagnosis not present

## 2014-09-10 DIAGNOSIS — IMO0002 Reserved for concepts with insufficient information to code with codable children: Secondary | ICD-10-CM | POA: Diagnosis not present

## 2014-09-10 DIAGNOSIS — Z79899 Other long term (current) drug therapy: Secondary | ICD-10-CM | POA: Insufficient documentation

## 2014-09-10 DIAGNOSIS — M25473 Effusion, unspecified ankle: Secondary | ICD-10-CM | POA: Insufficient documentation

## 2014-09-10 DIAGNOSIS — M25476 Effusion, unspecified foot: Secondary | ICD-10-CM | POA: Insufficient documentation

## 2014-09-10 DIAGNOSIS — F411 Generalized anxiety disorder: Secondary | ICD-10-CM | POA: Diagnosis not present

## 2014-09-10 DIAGNOSIS — M25579 Pain in unspecified ankle and joints of unspecified foot: Secondary | ICD-10-CM | POA: Insufficient documentation

## 2014-09-10 DIAGNOSIS — G43909 Migraine, unspecified, not intractable, without status migrainosus: Secondary | ICD-10-CM | POA: Diagnosis not present

## 2014-09-10 DIAGNOSIS — M25472 Effusion, left ankle: Secondary | ICD-10-CM

## 2014-09-10 LAB — SYNOVIAL CELL COUNT + DIFF, W/ CRYSTALS
Crystals, Fluid: NONE SEEN
Lymphocytes-Synovial Fld: 4 % (ref 0–20)
MONOCYTE-MACROPHAGE-SYNOVIAL FLUID: 1 % — AB (ref 50–90)
Neutrophil, Synovial: 95 % — ABNORMAL HIGH (ref 0–25)
WBC, Synovial: 14270 /mm3 — ABNORMAL HIGH (ref 0–200)

## 2014-09-10 LAB — GRAM STAIN
GRAM STAIN: NONE SEEN
SPECIAL REQUESTS: NORMAL

## 2014-09-10 MED ORDER — HYDROCODONE-ACETAMINOPHEN 5-325 MG PO TABS
2.0000 | ORAL_TABLET | Freq: Once | ORAL | Status: AC
  Administered 2014-09-10: 2 via ORAL
  Filled 2014-09-10: qty 2

## 2014-09-10 MED ORDER — COLCHICINE 0.6 MG PO TABS
0.6000 mg | ORAL_TABLET | Freq: Once | ORAL | Status: AC
  Administered 2014-09-10: 0.6 mg via ORAL
  Filled 2014-09-10: qty 1

## 2014-09-10 MED ORDER — LIDOCAINE HCL (PF) 1 % IJ SOLN
INTRAMUSCULAR | Status: AC
  Administered 2014-09-10: 5 mL
  Filled 2014-09-10: qty 5

## 2014-09-10 MED ORDER — HYDROCODONE-ACETAMINOPHEN 5-325 MG PO TABS: 1.0000 | ORAL_TABLET | ORAL | Status: DC | PRN

## 2014-09-10 NOTE — ED Notes (Signed)
CRITICAL VALUE ALERT  Critical value received: moderate wbcs of synovial fluid  Date of notification:  09/10/2014  Time of notification:  0950  Critical value read back:Yes.    Nurse who received alert:  Iona Coach  MD notified (1st page):  Zavitz  Time of first page:  [phone]    Time MD responded:  [phone]

## 2014-09-10 NOTE — ED Provider Notes (Signed)
CSN: [id number]     Arrival date & time 09/10/14  0621 History   This chart was scribed for Mariea Clonts, MD by Tula Nakayama, ED Scribe. This patient was seen in room APA06/APA06 and the patient's care was started at 7:15 AM.     Chief Complaint  Patient presents with  . Foot Pain    The history is provided by the patient. No language interpreter was used.   HPI Comments: Jacqueline [name] is a 63 y.o. female, with a history of gout-diagnosed in August, who presents to the Emergency Department complaining of constant, gradually worsening left foot pain with associated swelling and left knee pain that started several months ago. She complains of stomach pain as an associated symptom. Pt has taken Coltrazene and Indomethacin with relief in the past for similar symptoms. Pt states that she can no longer take Indomethacin because of GI symptoms. Pt takes Robaxin and Oxycodone for pain regularly, but recently ran out of Oxycodone. Pt denies SOB, fever, chills and vomiting.  Pt has history of osteoarthritis.   Past Medical History  Diagnosis Date  . Gastric nodule 2009    EUS, ?leiomyoma  . Irritable bowel syndrome   . History of pancreatitis     Biliary and/or etoh?  Marland Kitchen Anxiety   . Chronic back pain   . GERD (gastroesophageal reflux disease)   . Migraines   . 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 Peripheral neuropathy   . Knee pain    Past Surgical History  Procedure Laterality Date  . Back surgery /ray cage fusion comberg, gso    . Toe graft    . Cesarean section    . 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  . Tonsillectomy    . Abdominal hysterectomy    . Dilation and curettage of uterus    . Gallbladder surgery  2010  . Eus   12/2008    Dr. Paulita Fujita. Gastric nodule most consistent with Leiomyoma. Slightly thickened and irregular gallbladder wall, possibly due to sludge or diminutive stones. Recommend EUS in January 2011 to followup gastric nodule.  . Esophagogastroduodenoscopy  11/2008    A 9-mm submucosal lesion seen in the cardia., gastritis, no hpylori  . Colonoscopy  2001    Dr. Laural Golden, hemorrhoids  . Cholecystectomy    . Colonoscopy  10/21/2011    VVO:HYWVPXT polyp multiple/internal hemorrhoids  . Savory dilation  02/03/2012    GGY:[id number] in the distal esophagus/Mild gastritis  . Back surgery    . Abdominal surgery    . Foot surgery     Family History  Problem Relation Age of Onset  . Colon cancer Mother     diagnosed in late 77s and died age 10  . Heart failure Mother   . Heart defect      family history   . Arthritis      family history  . COPD      family history   . Cancer      multiple unknown type  . Heart attack Father     deceased at 42  . Hypertension Father   . Heart failure Father   . Lung cancer Maternal Uncle   . Throat cancer  Maternal Uncle   . Colon cancer Paternal Uncle   . Colon cancer Maternal Aunt   . Colon cancer Other   . Anesthesia problems Neg Hx   . Hypotension Neg Hx   . Malignant hyperthermia Neg Hx   . Pseudochol deficiency Neg Hx    History  Substance Use Topics  . Smoking status: Current Every Day Smoker -- 0.50 packs/day for 35 years    Types: Cigarettes  . Smokeless tobacco: Never Used  . Alcohol Use: Yes     Comment: occasional holiday drink   OB History   Grav Para Term Preterm Abortions TAB SAB Ect Mult Living   3 2 2  1  1   2      Review of Systems  Constitutional: Negative for fever and chills.  Gastrointestinal: Negative for nausea and vomiting.  Musculoskeletal: Positive for arthralgias and joint swelling.  All other systems reviewed and are negative.     Allergies  Aspirin; Imitrex; Ketorolac tromethamine; Nsaids; Promethazine hcl;  and Sulfonamide derivatives  Home Medications   Prior to Admission medications   Medication Sig Start Date End Date Taking? Authorizing Provider  gabapentin (NEURONTIN) 300 MG capsule Take 600 mg by mouth 3 (three) times daily.   Yes Historical Provider, MD  ibuprofen (ADVIL,MOTRIN) 200 MG tablet Take 400-800 mg by mouth every 8 (eight) hours as needed for moderate pain.   Yes Historical Provider, MD  methocarbamol (ROBAXIN) 750 MG tablet Take 750 mg by mouth 4 (four) times daily.   Yes Historical Provider, MD  omeprazole (PRILOSEC) 40 MG capsule Take 40 mg by mouth 2 (two) times daily before a meal.   Yes Historical Provider, MD  doxycycline (VIBRAMYCIN) 100 MG capsule Take 1 capsule (100 mg total) by mouth 2 (two) times daily. 08/17/14   Evalee Jefferson, PA-C  HYDROcodone-acetaminophen (NORCO/VICODIN) 5-325 MG per tablet Take 1 tablet by mouth 3 (three) times daily.    Historical Provider, MD  oxyCODONE-acetaminophen (PERCOCET/ROXICET) 5-325 MG per tablet Take 2 tablets by mouth every 6 (six) hours as needed for moderate pain or severe pain. 08/11/14   Elwyn Lade, PA-C  oxyCODONE-acetaminophen (PERCOCET/ROXICET) 5-325 MG per tablet Take 1 tablet by mouth every 4 (four) hours as needed. 08/17/14   Evalee Jefferson, PA-C  predniSONE (DELTASONE) 10 MG tablet 6, 5, 4, 3, 2 then 1 tablet by mouth daily for 6 days total. 08/17/14   Evalee Jefferson, PA-C   BP 128/97  Pulse 88  Temp(Src) 98.1 F (36.7 C) (Oral)  Ht 5\' 2"  (1.575 m)  Wt 114 lb (51.71 kg)  BMI 20.85 kg/m2  SpO2 99% Physical Exam  Nursing note and vitals reviewed. Constitutional: She is oriented to person, place, and time. She appears well-developed and well-nourished. No distress.  HENT:  Head: Normocephalic and atraumatic.  Mouth/Throat: Oropharynx is clear and moist. No oropharyngeal exudate.  Eyes: Pupils are equal, round, and reactive to light.  Neck: Neck supple.  Cardiovascular: Normal rate.   Pulmonary/Chest: Effort normal.   Musculoskeletal: She exhibits edema and tenderness.  Left leg, left foot: Ligament strong, no sign of effusion, moderate pain with flexion at left patella, mild swelling left ankle joint, tender to medial and lateral aspect of ankle joint, good pulses   Neurological: She is alert and oriented to person, place, and time. No cranial nerve deficit.  Skin: Skin is warm and dry. No rash noted. There is erythema.  mild warmth to medial aspect, no induraton or obvious cellulitis  Psychiatric: She  has a normal mood and affect. Her behavior is normal.    ED Course  Procedures (including critical care time) Ultrasound of left ankle joint, limited musculoskeletal soft tissue Indication mild swelling and pain, to assess for signs of gout versus other cause Performed using linear probe by myself Findings small joint effusion Images saved electronically Patient had mild discomfort during exam.  Left ankle joint arthrocentesis Indication pain and swelling Discussed risks and benefits including bleeding, infection, pain, injury to structures involved in the area. Betadine used with sterile technique, 10 cc syringe with lidocaine followed by aspiration with 22-gauge needle Small amount of yellow fluid with small amount of blood obtained. Sent to lab for further evaluation. Patient tolerated procedure well. Performed by myself DIAGNOSTIC STUDIES: Oxygen Saturation is 99% on RA, normal by my interpretation.    COORDINATION OF CARE: 7:19 AM-Will order pain medication and perform an [place] on pt's left foot. Pt denied wanting fluid removed from ankle. Pt agreed to tx plan.    Labs Review Labs Reviewed  SYNOVIAL CELL COUNT + DIFF, W/ CRYSTALS - Abnormal; Notable for the following:    Appearance-Synovial CLOUDY (*)    WBC, Synovial 14270 (*)    Neutrophil, Synovial 95 (*)    Monocyte-Macrophage-Synovial Fluid 1 (*)    All other components within normal limits  GRAM STAIN  BODY FLUID CULTURE   GLUCOSE, SYNOVIAL FLUID    Imaging Review No results found.   EKG Interpretation None      MDM   Final diagnoses:  Left ankle pain  Ankle swelling, left   I personally performed the services described in this documentation, which was scribed in my presence. The recorded information has been reviewed and is accurate.  Joint aspiration done, small amount of yellow fluid. Suspicion for septic joint. Blood cells 15,000, gout history however no crystals. Discussed pain meds, and followup with orthopedics on Monday. Similar to previous pain. Pain controlled in ER.  Results and differential diagnosis were discussed with the patient/parent/guardian. Close follow up outpatient was discussed, comfortable with the plan.   Medications  colchicine tablet 0.6 mg (0.6 mg Oral Given 09/10/14 0745)  HYDROcodone-acetaminophen (NORCO/VICODIN) 5-325 MG per tablet 2 tablet (2 tablets Oral Given 09/10/14 0745)  lidocaine (PF) (XYLOCAINE) 1 % injection (5 mLs  Given by Other 09/10/14 0812)    Filed Vitals:   09/10/14 0629  BP: 128/97  Pulse: 88  Temp: 98.1 F (36.7 C)  TempSrc: Oral  Height: 5\' 2"  (1.575 m)  Weight: 114 lb (51.71 kg)  SpO2: 99%    Final diagnoses:  Left ankle pain  Ankle swelling, left        Mariea Clonts, MD 09/10/14 1114

## 2014-09-10 NOTE — Discharge Instructions (Signed)
For severe pain take norco or vicodin however realize they have the potential for addiction and it can make you sleepy and has tylenol in it.  No operating machinery while taking. If you were given medicines take as directed.  If you are on coumadin or contraceptives realize their levels and effectiveness is altered by many different medicines.  If you have any reaction (rash, tongues swelling, other) to the medicines stop taking and see a physician.   Continue to take Colchicine and home medicines as previously directed. Return for fevers, worsening swelling, spreading redness or hot joint. Please follow up as directed and return to the ER or see a physician for new or worsening symptoms.  Thank you. Filed Vitals:   09/10/14 0629  BP: 128/97  Pulse: 88  Temp: 98.1 F (36.7 C)  TempSrc: Oral  Height: 5\' 2"  (1.575 m)  Weight: 114 lb (51.71 kg)  SpO2: 99%

## 2014-09-10 NOTE — ED Notes (Signed)
Pt states she has been having left foot pain for the past several months. Pt has been seen in the ER before.

## 2014-09-11 LAB — GLUCOSE, SYNOVIAL FLUID: GLUCOSE, SYNOVIAL FLUID: 63 mg/dL

## 2014-09-14 LAB — BODY FLUID CULTURE: Culture: NO GROWTH

## 2014-10-05 ENCOUNTER — Emergency Department (HOSPITAL_COMMUNITY)
Admission: EM | Admit: 2014-10-05 | Discharge: 2014-10-05 | Disposition: A | Discharge status: Home / Self Care | Payer: Medicaid Other | Attending: Emergency Medicine | Admitting: Emergency Medicine

## 2014-10-05 ENCOUNTER — Emergency Department (HOSPITAL_COMMUNITY): Payer: Medicaid Other

## 2014-10-05 ENCOUNTER — Encounter (HOSPITAL_COMMUNITY): Payer: Self-pay | Admitting: Emergency Medicine

## 2014-10-05 DIAGNOSIS — F419 Anxiety disorder, unspecified: Secondary | ICD-10-CM | POA: Diagnosis not present

## 2014-10-05 DIAGNOSIS — K219 Gastro-esophageal reflux disease without esophagitis: Secondary | ICD-10-CM | POA: Insufficient documentation

## 2014-10-05 DIAGNOSIS — G629 Polyneuropathy, unspecified: Secondary | ICD-10-CM | POA: Diagnosis not present

## 2014-10-05 DIAGNOSIS — G43909 Migraine, unspecified, not intractable, without status migrainosus: Secondary | ICD-10-CM | POA: Insufficient documentation

## 2014-10-05 DIAGNOSIS — M109 Gout, unspecified: Secondary | ICD-10-CM | POA: Diagnosis not present

## 2014-10-05 DIAGNOSIS — G8929 Other chronic pain: Secondary | ICD-10-CM | POA: Diagnosis not present

## 2014-10-05 DIAGNOSIS — Z79899 Other long term (current) drug therapy: Secondary | ICD-10-CM | POA: Insufficient documentation

## 2014-10-05 DIAGNOSIS — Z86018 Personal history of other benign neoplasm: Secondary | ICD-10-CM | POA: Insufficient documentation

## 2014-10-05 DIAGNOSIS — M11831 Other specified crystal arthropathies, right wrist: Secondary | ICD-10-CM | POA: Insufficient documentation

## 2014-10-05 DIAGNOSIS — M11231 Other chondrocalcinosis, right wrist: Secondary | ICD-10-CM

## 2014-10-05 DIAGNOSIS — Z720 Tobacco use: Secondary | ICD-10-CM | POA: Insufficient documentation

## 2014-10-05 DIAGNOSIS — M25531 Pain in right wrist: Secondary | ICD-10-CM | POA: Diagnosis present

## 2014-10-05 HISTORY — DX: Gout, unspecified: M10.9

## 2014-10-05 LAB — BASIC METABOLIC PANEL
Anion gap: 12 (ref 5–15)
BUN: 6 mg/dL (ref 6–23)
CALCIUM: 9.5 mg/dL (ref 8.4–10.5)
CO2: 31 mEq/L (ref 19–32)
CREATININE: 0.62 mg/dL (ref 0.50–1.10)
Chloride: 101 mEq/L (ref 96–112)
Glucose, Bld: 94 mg/dL (ref 70–99)
Potassium: 3.7 mEq/L (ref 3.7–5.3)
Sodium: 144 mEq/L (ref 137–147)

## 2014-10-05 LAB — CBC WITH DIFFERENTIAL/PLATELET
BASOS PCT: 0 % (ref 0–1)
Basophils Absolute: 0 10*3/uL (ref 0.0–0.1)
EOS PCT: 2 % (ref 0–5)
Eosinophils Absolute: 0.2 10*3/uL (ref 0.0–0.7)
HEMATOCRIT: 39.8 % (ref 36.0–46.0)
Hemoglobin: 13.2 g/dL (ref 12.0–15.0)
LYMPHS PCT: 29 % (ref 12–46)
Lymphs Abs: 2.5 10*3/uL (ref 0.7–4.0)
MCH: 30.6 pg (ref 26.0–34.0)
MCHC: 33.2 g/dL (ref 30.0–36.0)
MCV: 92.1 fL (ref 78.0–100.0)
MONO ABS: 0.5 10*3/uL (ref 0.1–1.0)
Monocytes Relative: 5 % (ref 3–12)
Neutro Abs: 5.5 10*3/uL (ref 1.7–7.7)
Neutrophils Relative %: 64 % (ref 43–77)
PLATELETS: 359 10*3/uL (ref 150–400)
RBC: 4.32 MIL/uL (ref 3.87–5.11)
RDW: 13.1 % (ref 11.5–15.5)
WBC: 8.7 10*3/uL (ref 4.0–10.5)

## 2014-10-05 LAB — URIC ACID: URIC ACID, SERUM: 1.5 mg/dL — AB (ref 2.4–7.0)

## 2014-10-05 MED ORDER — ONDANSETRON 8 MG PO TBDP
8.0000 mg | ORAL_TABLET | Freq: Once | ORAL | Status: AC
  Administered 2014-10-05: 8 mg via ORAL
  Filled 2014-10-05: qty 1

## 2014-10-05 MED ORDER — HYDROMORPHONE HCL 1 MG/ML IJ SOLN
1.0000 mg | Freq: Once | INTRAMUSCULAR | Status: AC
  Administered 2014-10-05: 1 mg via INTRAMUSCULAR
  Filled 2014-10-05: qty 1

## 2014-10-05 MED ORDER — COLCHICINE 0.6 MG PO TABS: 0.6000 mg | ORAL_TABLET | Freq: Two times a day (BID) | ORAL | Status: DC

## 2014-10-05 MED ORDER — HYDROCODONE-ACETAMINOPHEN 5-325 MG PO TABS
1.0000 | ORAL_TABLET | Freq: Once | ORAL | Status: AC
  Administered 2014-10-05: 1 via ORAL
  Filled 2014-10-05: qty 1

## 2014-10-05 MED ORDER — OXYCODONE-ACETAMINOPHEN 5-325 MG PO TABS: 1.0000 | ORAL_TABLET | ORAL | Status: DC | PRN

## 2014-10-05 NOTE — Discharge Instructions (Signed)
Pseudogout Pseudogout is similar to gout. It is an arthritis that causes pain, swelling, and inflammation in a joint. This is due to the presence of calcium pyrophosphate crystals in the joint fluid. This is a different type of crystal than the crystals that cause gout. The joint pain can be severe and may last for days. In some cases, it may last much longer and can mimic rheumatoid arthritis or osteoarthritis. CAUSES  The exact cause of the disease is not known. It develops when crystals of calcium pyrophosphate build up in a joint. These crystals cause inflammation that leads to pain and swelling of the joint.  Chances of developing pseudogout increase with age. It often follows a minor injury.  The condition may be passed down from parent to child (hereditary).  Events such as strokes, heart attacks, or surgery may increase the risk of pseudogout.  Pseudogout can be associated with other disorders (hemophilia, ochronosis, amyloidosis, or hormonal disorders).  Pseudogout can be associated with dehydration, especially following surgery or hospitalization. Patients with known pseudogout should stay well hydrated before and after surgery. SYMPTOMS   Intense, constant pain in one joint that seems to come on for no reason.  The joint area may be hot to the touch, red, swollen, and stiff.  Pain may last from several days to a few weeks. It may then disappear. Later, it may start again, possibly in a different joint.  Pseudogout usually affects the knees. It can also affect the wrists, elbows, shoulders, and ankles. DIAGNOSIS  The diagnosis is often suggested by your exam or is suspected when an abnormal buildup of calcium salts (calcifications) are seen in the cartilage of joints on X-rays. The final diagnosis is made when fluid from the joint is examined under a special microscope used to find calcium pyrophosphate crystals. The crystals of pseudogout and gout may both be present at the same  time. TREATMENT  Nonsteroidal anti-inflammatory drugs (NSAIDs), such as naproxen, treat the pain. Identifying the trigger of pseudogout and treating the underlying cause, such as dehydration, is also important. There is no way to remove the crystals themselves. HOME CARE INSTRUCTIONS   Put ice on the sore joint.  Put ice in a plastic bag.  Place a towel between your skin and the bag.  Leave the ice on for 15-20 minutes at a time, 03-04 times a day, for the first 2 days.  Keep your affected joints raised (elevated) when possible to lessen swelling.  Use crutches (non-weight bearing) as needed. Walk without crutches as the pain allows, or as instructed. Gradually, start bearing weight.  Only take over-the-counter or prescription medicines for pain, discomfort, or fever as directed by your caregiver.  Once you recover from the painful attack, exercise regularly to keep your muscle strength. Not using a sore joint will cause the muscles around it to become weak. This may increase pain. Low-impact exercises, such as swimming, bicycling, water aerobics, and walking, may be best. This will give you energy, strengthen your heart, help you control your weight, and improve your well-being.  Maintain a healthy weight so your joints do not need to bear more weight than necessary. SEEK MEDICAL CARE IF:   You have an increase in joint pain not relieved with medicine.  You have a fever.  You have more serious symptoms such as skin rash, diarrhea, vomiting, headache, or other joint pains. FOR MORE INFORMATION  Arthritis Foundation: www.arthritis.org National Institute of Arthritis and Musculoskeletal and Skin Diseases: www.niams.nih.gov Document Released: 08/30/2004   Document Revised: 03/01/2012 Document Reviewed: 03/22/2010 [facility] Patient Information 2015 [place], [place]. This information is not intended to replace advice given to you by your health care provider. Make sure you discuss any  questions you have with your health care provider.   You may take the oxycodone prescribed for pain relief.  This will make you drowsy - do not drive within 4 hours of taking this medication. Start taking your colchicine again.  Plan to see Dr. Sherrie Sport on Monday - call for an appointment time.

## 2014-10-05 NOTE — ED Notes (Signed)
Pain , swelling, redness of rt wrist. NO known injury, hx of gout.

## 2014-10-06 LAB — RHEUMATOID FACTOR: Rhuematoid fact SerPl-aCnc: <10 IU/mL (ref <=14)

## 2014-10-06 NOTE — ED Provider Notes (Signed)
CSN: [id number]     Arrival date & time 10/05/14  1310 History   First MD Initiated Contact with Patient 10/05/14 1345     Chief Complaint  Patient presents with  . Wrist Pain     (Consider location/radiation/quality/duration/timing/severity/associated sxs/prior Treatment) The history is provided by the patient.   Jacqueline [name] is a 63 y.o. female presenting with persistent swelling and redness of her right wrist starting several days ago.  She denies injury of the joint.  She reports possibly having a history of gout, stating she has had intermittent swelling joints including ankles, most recently was here for her left ankle which was tapped and negative for infection and crystals.  This left ankle is still swollen but the pain has greatly improved.  She is starting to have pain in the right ankle currently, but so far no swelling or redness.  She does endorse a family history of rheumatoid arthritis.  She has taken colchicine and indocin in the past with no relief of symptoms.  She ran out of the colchicine this week.  She took an ibuprofen this am without relief also but developed stomach upset.     Past Medical History  Diagnosis Date  . Gastric nodule 2009    EUS, ?leiomyoma  . Irritable bowel syndrome   . History of pancreatitis     Biliary and/or etoh?  Marland Kitchen Anxiety   . Chronic back pain   . GERD (gastroesophageal reflux disease)   . Migraines   . 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 Peripheral neuropathy   . Knee pain   . Gout    Past Surgical History  Procedure Laterality Date  . Back surgery /ray cage fusion comberg, gso    . Toe graft    . Cesarean section    . 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  .  Tonsillectomy    . Abdominal hysterectomy    . Dilation and curettage of uterus    . Gallbladder surgery  2010  . Eus  12/2008    Dr. Paulita Fujita. Gastric nodule most consistent with Leiomyoma. Slightly thickened and irregular gallbladder wall, possibly due to sludge or diminutive stones. Recommend EUS in January 2011 to followup gastric nodule.  . Esophagogastroduodenoscopy  11/2008    A 9-mm submucosal lesion seen in the cardia., gastritis, no hpylori  . Colonoscopy  2001    Dr. Laural Golden, hemorrhoids  . Cholecystectomy    . Colonoscopy  10/21/2011    JXB:[id number] polyp multiple/internal hemorrhoids  . Savory dilation  02/03/2012    AOZ:HYQMVHQIO in the distal esophagus/Mild gastritis  . Back surgery    . Abdominal surgery    . Foot surgery     Family History  Problem Relation Age of Onset  . Colon cancer Mother     diagnosed in late 45s and died age 41  . Heart failure Mother   . Heart defect      family history   . Arthritis      family history  . COPD      family history   . Cancer      multiple unknown type  . Heart attack Father     deceased at 37  .  Hypertension Father   . Heart failure Father   . Lung cancer Maternal Uncle   . Throat cancer Maternal Uncle   . Colon cancer Paternal Uncle   . Colon cancer Maternal Aunt   . Colon cancer Other   . Anesthesia problems Neg Hx   . Hypotension Neg Hx   . Malignant hyperthermia Neg Hx   . Pseudochol deficiency Neg Hx    History  Substance Use Topics  . Smoking status: Current Every Day Smoker -- 0.50 packs/day for 35 years    Types: Cigarettes  . Smokeless tobacco: Never Used  . Alcohol Use: Yes     Comment: occasional holiday drink   OB History   Grav Para Term Preterm Abortions TAB SAB Ect Mult Living   3 2 2  1  1   2      Review of Systems  Constitutional: Negative for fever.  Musculoskeletal: Positive for arthralgias and joint swelling. Negative for myalgias.  Skin: Positive for color change.  Neurological:  Negative for weakness and numbness.      Allergies  Aspirin; Imitrex; Ketorolac tromethamine; Nsaids; Promethazine hcl; and Sulfonamide derivatives  Home Medications   Prior to Admission medications   Medication Sig Start Date End Date Taking? Authorizing Provider  colchicine 0.6 MG tablet Take 0.6 mg by mouth daily.   Yes Historical Provider, MD  gabapentin (NEURONTIN) 300 MG capsule Take 600 mg by mouth 3 (three) times daily.   Yes Historical Provider, MD  ibuprofen (ADVIL,MOTRIN) 200 MG tablet Take 400-800 mg by mouth every 8 (eight) hours as needed for moderate pain.   Yes Historical Provider, MD  indomethacin (INDOCIN) 25 MG capsule Take 25 mg by mouth 3 (three) times daily as needed for mild pain.   Yes Historical Provider, MD  methocarbamol (ROBAXIN) 750 MG tablet Take 750 mg by mouth 4 (four) times daily.   Yes Historical Provider, MD  omeprazole (PRILOSEC) 40 MG capsule Take 40 mg by mouth 2 (two) times daily before a meal.   Yes Historical Provider, MD  colchicine 0.6 MG tablet Take 1 tablet (0.6 mg total) by mouth 2 (two) times daily. 10/05/14   Evalee Jefferson, PA-C  oxyCODONE-acetaminophen (PERCOCET/ROXICET) 5-325 MG per tablet Take 1 tablet by mouth every 4 (four) hours as needed. 10/05/14   Evalee Jefferson, PA-C   BP 150/105  Pulse 80  Temp(Src) 98.9 F (37.2 C) (Oral)  Resp 16  Ht 5\' 2"  (1.575 m)  Wt 111 lb (50.349 kg)  BMI 20.30 kg/m2  SpO2 99% Physical Exam  Constitutional: She appears well-developed and well-nourished.  HENT:  Head: Atraumatic.  Neck: Normal range of motion.  Cardiovascular:  Pulses equal bilaterally  Musculoskeletal: She exhibits edema and tenderness.       Right wrist: She exhibits tenderness and swelling.  Ttp, swelling, erythema of right dorsal and volar wrist.  No red streaking, skin intact. Warm but not hot.  She is able to flex and extend the joint but with discomfort. No skin crepitus.  Distal sensation intact with less than 2 sec cap refill in  fingers.   Upper forearm nontender.  Neurological: She is alert. She has normal strength. She displays normal reflexes. No sensory deficit.  Skin: Skin is warm and dry.  Psychiatric: She has a normal mood and affect.    ED Course  Procedures (including critical care time) Labs Review Labs Reviewed  URIC ACID - Abnormal; Notable for the following:    Uric Acid, Serum 1.5 (*)  All other components within normal limits  CBC WITH DIFFERENTIAL  BASIC METABOLIC PANEL  RHEUMATOID FACTOR    Imaging Review Dg Wrist Complete Right  10/05/2014   CLINICAL DATA:  Wrist pain for 4 days.  EXAM: RIGHT WRIST - COMPLETE 3+ VIEW  COMPARISON:  06/23/2014  FINDINGS: There is severe arthritis of the first carpal metacarpal joint and to a lesser degree between the scaphoid and trapezium and trapezoid. There is calcification at the radiocarpal joint consistent with chondrocalcinosis. There is soft tissue swelling around the wrist.  There is a small accessory ossicle distal to the ulnar styloid.  IMPRESSION: CPPD arthritis. Prominent soft tissue swelling around the wrist suggesting synovitis.   Electronically Signed   By: Rozetta Nunnery M.D.   On: 10/05/2014 14:46     EKG Interpretation None      MDM   Final diagnoses:  Pseudogout of wrist, right    Patients labs and/or radiological studies were viewed and considered during the medical decision making and disposition process. Pt discussed with Dr Roderic Palau prior to dc home. Pt with intermittent joint edema and pain, negative crystals at last ed visit when the left ankle was tapped - xrays suggesting pseudogout possibility but pt has been on colchicine 0.6 mg and indocin without improvement in sx.  Exam not c/w septic joint. No risk factors for this. She has appt with her pcp in 10 days for purpose of getting referral to ortho.  Todays labs and xrays again suggest pseudogout as the diagnosis.  Discussed with her pcp who will see her early next week (on  Monday in 4 days - pt to call for appt time) In the interim,  He suggested nsaid, continued colchicine.  Advised pt of plan.  While discharging pt, discovered she cannot tolerate nsaids secondary to partial gastrectomy.  She will be placed back on colchicine, will prescribe hydrocodone for pain relief instead.  PCP did not want her to be on prednisone at this time.  Pt agrees with plan - will call for appt time on Monday.    Evalee Jefferson, PA-C 10/06/14 1359

## 2014-10-08 NOTE — ED Provider Notes (Signed)
Medical screening examination/treatment/procedure(s) were performed by non-physician practitioner and as supervising physician I was immediately available for consultation/collaboration.   EKG Interpretation None        Jettie Mannor L Dondrea Clendenin, MD 10/08/14 0022 

## 2014-10-13 ENCOUNTER — Encounter (HOSPITAL_COMMUNITY): Payer: Self-pay | Admitting: Emergency Medicine

## 2014-10-13 ENCOUNTER — Emergency Department (HOSPITAL_COMMUNITY)
Admission: EM | Admit: 2014-10-13 | Discharge: 2014-10-13 | Disposition: A | Discharge status: Home / Self Care | Payer: Medicaid Other | Attending: Emergency Medicine | Admitting: Emergency Medicine

## 2014-10-13 DIAGNOSIS — G8929 Other chronic pain: Secondary | ICD-10-CM | POA: Insufficient documentation

## 2014-10-13 DIAGNOSIS — Z79899 Other long term (current) drug therapy: Secondary | ICD-10-CM | POA: Diagnosis not present

## 2014-10-13 DIAGNOSIS — G43909 Migraine, unspecified, not intractable, without status migrainosus: Secondary | ICD-10-CM | POA: Diagnosis not present

## 2014-10-13 DIAGNOSIS — G629 Polyneuropathy, unspecified: Secondary | ICD-10-CM | POA: Insufficient documentation

## 2014-10-13 DIAGNOSIS — K219 Gastro-esophageal reflux disease without esophagitis: Secondary | ICD-10-CM | POA: Diagnosis not present

## 2014-10-13 DIAGNOSIS — Z720 Tobacco use: Secondary | ICD-10-CM | POA: Diagnosis not present

## 2014-10-13 DIAGNOSIS — M109 Gout, unspecified: Secondary | ICD-10-CM | POA: Insufficient documentation

## 2014-10-13 DIAGNOSIS — F419 Anxiety disorder, unspecified: Secondary | ICD-10-CM | POA: Diagnosis not present

## 2014-10-13 DIAGNOSIS — M25471 Effusion, right ankle: Secondary | ICD-10-CM | POA: Diagnosis present

## 2014-10-13 DIAGNOSIS — M1120 Other chondrocalcinosis, unspecified site: Secondary | ICD-10-CM

## 2014-10-13 MED ORDER — PREDNISONE 10 MG PO TABS: 20.0000 mg | ORAL_TABLET | Freq: Every day | ORAL | Status: DC

## 2014-10-13 MED ORDER — HYDROMORPHONE HCL 1 MG/ML IJ SOLN
1.0000 mg | Freq: Once | INTRAMUSCULAR | Status: AC
  Administered 2014-10-13: 1 mg via INTRAMUSCULAR
  Filled 2014-10-13: qty 1

## 2014-10-13 MED ORDER — METHYLPREDNISOLONE SODIUM SUCC 125 MG IJ SOLR
125.0000 mg | Freq: Once | INTRAMUSCULAR | Status: AC
  Administered 2014-10-13: 125 mg via INTRAMUSCULAR
  Filled 2014-10-13: qty 2

## 2014-10-13 MED ORDER — OXYCODONE-ACETAMINOPHEN 5-325 MG PO TABS: 1.0000 | ORAL_TABLET | Freq: Four times a day (QID) | ORAL | Status: DC | PRN

## 2014-10-13 NOTE — ED Notes (Signed)
Patient called stating that she feels like she is going to be sick on the stomach. Emesis bag given. RN made aware.

## 2014-10-13 NOTE — Discharge Instructions (Signed)
Follow up Monday as planned.

## 2014-10-13 NOTE — ED Notes (Signed)
Pt co ankle and wrist swelling/pain x2 weeks, seen in ED for same recently.

## 2014-10-13 NOTE — ED Provider Notes (Signed)
CSN: [id number]     Arrival date & time 10/13/14  1002 History   First MD Initiated Contact with Patient 10/13/14 1208     This chart was scribed for Maudry Diego, MD by Forrestine Him, ED Scribe. This patient was seen in room APA17/APA17 and the patient's care was started 12:15 PM.   Chief Complaint  Patient presents with  . Joint Swelling    Patient is a 63 y.o. female presenting with general illness. The history is provided by the patient. No language interpreter was used.  Illness Location:  R wrist and R ankle Severity:  Moderate Onset quality:  Gradual Duration:  2 weeks Timing:  Constant Progression:  Worsening Chronicity:  New Relieved by:  Nothing Worsened by:  Movement Ineffective treatments:  Prescribed Colchicine Associated symptoms: no abdominal pain, no chest pain, no congestion, no cough, no diarrhea, no fatigue, no headaches and no rash     HPI Comments: Jacqueline [name] is a 63 y.o. female with multiple medical problems who presents to the Emergency Department complaining of ongoing, constant, moderate R ankle swelling x 2 weeks that is not improving. She also reports swelling to the R wrist. Pt has tried prescribed Colchicine twice daily without any improvement for symptoms. [name] was previously seen for same complaint and was diagnosed with pseudo gout. No other associated symptoms at this time. Pt plans to follow up next week outpatient. Pt with multiple allergies to medications as listed below. No other concerns this visit  Past Medical History  Diagnosis Date  . Gastric nodule 2009    EUS, ?leiomyoma  . Irritable bowel syndrome   . History of pancreatitis     Biliary and/or etoh?  Marland Kitchen Anxiety   . Chronic back pain   . GERD (gastroesophageal reflux disease)   . Migraines   . 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 Peripheral neuropathy   . Knee pain   . Gout     Past Surgical History  Procedure Laterality Date  . Back surgery /ray cage fusion comberg, gso    . Toe graft    . Cesarean section    . 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  . Tonsillectomy    . Abdominal hysterectomy    . Dilation and curettage of uterus    . Gallbladder surgery  2010  . Eus  12/2008    Dr. Paulita Fujita. Gastric nodule most consistent with Leiomyoma. Slightly thickened and irregular gallbladder wall, possibly due to sludge or diminutive stones. Recommend EUS in January 2011 to followup gastric nodule.  . Esophagogastroduodenoscopy  11/2008    A 9-mm submucosal lesion seen in the cardia., gastritis, no hpylori  . Colonoscopy  2001    Dr. Laural Golden, hemorrhoids  . Cholecystectomy    . Colonoscopy  10/21/2011    WUJ:[id number] polyp multiple/internal hemorrhoids  . Savory dilation  02/03/2012    [id number]:[id number] in the distal esophagus/Mild gastritis  . Back surgery    . Abdominal surgery    . Foot surgery     Family History  Problem Relation Age of Onset  . Colon cancer Mother     diagnosed in late 37s and died age 58  . Heart failure Mother   .  Heart defect      family history   . Arthritis      family history  . COPD      family history   . Cancer      multiple unknown type  . Heart attack Father     deceased at 22  . Hypertension Father   . Heart failure Father   . Lung cancer Maternal Uncle   . Throat cancer Maternal Uncle   . Colon cancer Paternal Uncle   . Colon cancer Maternal Aunt   . Colon cancer Other   . Anesthesia problems Neg Hx   . Hypotension Neg Hx   . Malignant hyperthermia Neg Hx   . Pseudochol deficiency Neg Hx    History  Substance Use Topics  . Smoking status: Current Every Day Smoker -- 0.50 packs/day for 35 years    Types: Cigarettes  . Smokeless tobacco: Never Used  .  Alcohol Use: Yes     Comment: occasional holiday drink   OB History   Grav Para Term Preterm Abortions TAB SAB Ect Mult Living   3 2 2  1  1   2      Review of Systems  Constitutional: Negative for appetite change and fatigue.  HENT: Negative for congestion, ear discharge and sinus pressure.   Eyes: Negative for discharge.  Respiratory: Negative for cough.   Cardiovascular: Negative for chest pain.  Gastrointestinal: Negative for abdominal pain and diarrhea.  Genitourinary: Negative for frequency and hematuria.  Musculoskeletal: Positive for arthralgias and joint swelling. Negative for back pain.  Skin: Negative for rash.  Neurological: Negative for seizures and headaches.  Psychiatric/Behavioral: Negative for hallucinations.      Allergies  Aspirin; Imitrex; Ketorolac tromethamine; Nsaids; Promethazine hcl; and Sulfonamide derivatives  Home Medications   Prior to Admission medications   Medication Sig Start Date End Date Taking? Authorizing Provider  colchicine 0.6 MG tablet Take 1 tablet (0.6 mg total) by mouth 2 (two) times daily. 10/05/14  Yes Evalee Jefferson, PA-C  omeprazole (PRILOSEC) 40 MG capsule Take 40 mg by mouth 2 (two) times daily before a meal.   Yes Historical Provider, MD  gabapentin (NEURONTIN) 300 MG capsule Take 600 mg by mouth 3 (three) times daily.    Historical Provider, MD  ibuprofen (ADVIL,MOTRIN) 200 MG tablet Take 400-800 mg by mouth every 8 (eight) hours as needed for moderate pain.    Historical Provider, MD  methocarbamol (ROBAXIN) 750 MG tablet Take 750 mg by mouth 4 (four) times daily.    Historical Provider, MD   Triage Vitals: BP 143/94  Pulse 60  Temp(Src) 98.3 F (36.8 C) (Oral)  Resp 16  Ht 5\' 2"  (1.575 m)  Wt 110 lb (49.896 kg)  BMI 20.11 kg/m2  SpO2 100%   Physical Exam  Constitutional: She is oriented to person, place, and time. She appears well-developed.  HENT:  Head: Normocephalic.  Eyes: Conjunctivae are normal.  Neck: No  tracheal deviation present.  Cardiovascular:  No murmur heard. Musculoskeletal: Normal range of motion.  R wrist is swollen tender and mildly red R ankle is swollen and tender  Neurological: She is oriented to person, place, and time.  Skin: Skin is warm.  Psychiatric: She has a normal mood and affect.    ED Course  Procedures (including critical care time)  DIAGNOSTIC STUDIES: Oxygen Saturation is 100% on RA, Normal by my interpretation.    COORDINATION OF CARE: 12:22 PM- Will give Solu-Medrol injection and dilaudid. Discussed  treatment plan with pt at bedside and pt agreed to plan.     Labs Review Labs Reviewed - No data to display  Imaging Review No results found.   EKG Interpretation None      MDM   Final diagnoses:  None    The chart was scribed for me under my direct supervision.  I personally performed the history, physical, and medical decision making and all procedures in the evaluation of this patient..   I personally performed the services described in this documentation, which was scribed in my presence. The recorded information has been reviewed and is accurate.    Maudry Diego, MD 10/17/14 [phone]

## 2014-10-23 ENCOUNTER — Encounter (HOSPITAL_COMMUNITY): Payer: Self-pay | Admitting: Emergency Medicine

## 2014-12-07 ENCOUNTER — Emergency Department (HOSPITAL_COMMUNITY)
Admission: EM | Admit: 2014-12-07 | Discharge: 2014-12-07 | Disposition: A | Discharge status: Home / Self Care | Payer: Medicaid Other | Attending: Emergency Medicine | Admitting: Emergency Medicine

## 2014-12-07 ENCOUNTER — Encounter (HOSPITAL_COMMUNITY): Payer: Self-pay | Admitting: Emergency Medicine

## 2014-12-07 DIAGNOSIS — F419 Anxiety disorder, unspecified: Secondary | ICD-10-CM | POA: Diagnosis not present

## 2014-12-07 DIAGNOSIS — M10031 Idiopathic gout, right wrist: Secondary | ICD-10-CM | POA: Insufficient documentation

## 2014-12-07 DIAGNOSIS — K219 Gastro-esophageal reflux disease without esophagitis: Secondary | ICD-10-CM | POA: Insufficient documentation

## 2014-12-07 DIAGNOSIS — Z720 Tobacco use: Secondary | ICD-10-CM | POA: Diagnosis not present

## 2014-12-07 DIAGNOSIS — Z8603 Personal history of neoplasm of uncertain behavior: Secondary | ICD-10-CM | POA: Diagnosis not present

## 2014-12-07 DIAGNOSIS — G8929 Other chronic pain: Secondary | ICD-10-CM | POA: Insufficient documentation

## 2014-12-07 DIAGNOSIS — Z7952 Long term (current) use of systemic steroids: Secondary | ICD-10-CM | POA: Diagnosis not present

## 2014-12-07 DIAGNOSIS — G43909 Migraine, unspecified, not intractable, without status migrainosus: Secondary | ICD-10-CM | POA: Diagnosis not present

## 2014-12-07 DIAGNOSIS — Z79899 Other long term (current) drug therapy: Secondary | ICD-10-CM | POA: Diagnosis not present

## 2014-12-07 DIAGNOSIS — M109 Gout, unspecified: Secondary | ICD-10-CM

## 2014-12-07 DIAGNOSIS — G629 Polyneuropathy, unspecified: Secondary | ICD-10-CM | POA: Diagnosis not present

## 2014-12-07 DIAGNOSIS — M25531 Pain in right wrist: Secondary | ICD-10-CM

## 2014-12-07 MED ORDER — PREDNISONE 50 MG PO TABS
60.0000 mg | ORAL_TABLET | Freq: Once | ORAL | Status: AC
  Administered 2014-12-07: 60 mg via ORAL
  Filled 2014-12-07 (×2): qty 1

## 2014-12-07 MED ORDER — PREDNISONE 10 MG PO TABS: ORAL_TABLET | ORAL | Status: DC

## 2014-12-07 MED ORDER — OXYCODONE-ACETAMINOPHEN 5-325 MG PO TABS: 1.0000 | ORAL_TABLET | ORAL | Status: DC | PRN

## 2014-12-07 MED ORDER — OXYCODONE-ACETAMINOPHEN 5-325 MG PO TABS
1.0000 | ORAL_TABLET | Freq: Once | ORAL | Status: AC
  Administered 2014-12-07: 1 via ORAL
  Filled 2014-12-07: qty 1

## 2014-12-07 NOTE — Discharge Instructions (Signed)
Gout Gout is when your joints become red, sore, and swell (inflamed). This is caused by the buildup of uric acid crystals in the joints. Uric acid is a chemical that is normally in the blood. If the level of uric acid gets too high in the blood, these crystals form in your joints and tissues. Over time, these crystals can form into masses near the joints and tissues. These masses can destroy bone and cause the bone to look misshapen (deformed). HOME CARE   Do not take aspirin for pain.  Only take medicine as told by your doctor.  Rest the joint as much as you can. When in bed, keep sheets and blankets off painful areas.  Keep the sore joints raised (elevated).  Put warm or cold packs on painful joints. Use of warm or cold packs depends on which works best for you.  Use crutches if the painful joint is in your leg.  Drink enough fluids to keep your pee (urine) clear or pale yellow. Limit alcohol, sugary drinks, and drinks with fructose in them.  Follow your diet instructions. Pay careful attention to how much protein you eat. Include fruits, vegetables, whole grains, and fat-free or low-fat milk products in your daily diet. Talk to your doctor or dietitian about the use of coffee, vitamin C, and cherries. These may help lower uric acid levels.  Keep a healthy body weight. GET HELP RIGHT AWAY IF:   You have watery poop (diarrhea), throw up (vomit), or have any side effects from medicines.  You do not feel better in 24 hours, or you are getting worse.  Your joint becomes suddenly more tender, and you have chills or a fever. MAKE SURE YOU:   Understand these instructions.  Will watch your condition.  Will get help right away if you are not doing well or get worse. Document Released: 09/16/2008 Document Revised: 04/24/2014 Document Reviewed: 07/21/2012 [facility] Patient Information 2015 [place], [place]. This information is not intended to replace advice given to you by your health care  provider. Make sure you discuss any questions you have with your health care provider.  Low-Purine Diet Purines are compounds that affect the level of uric acid in your body. A low-purine diet is a diet that is low in purines. Eating a low-purine diet can prevent the level of uric acid in your body from getting too high and causing gout or kidney stones or both. WHAT DO I NEED TO KNOW ABOUT THIS DIET?  Choose low-purine foods. Examples of low-purine foods are listed in the next section.  Drink plenty of fluids, especially water. Fluids can help remove uric acid from your body. Try to drink 8-16 cups (1.9-3.8 L) a day.  Limit foods high in fat, especially saturated fat, as fat makes it harder for the body to get rid of uric acid. Foods high in saturated fat include pizza, cheese, ice cream, whole milk, fried foods, and gravies. Choose foods that are lower in fat and lean sources of protein. Use olive oil when cooking as it contains healthy fats that are not high in saturated fat.  Limit alcohol. Alcohol interferes with the elimination of uric acid from your body. If you are having a gout attack, avoid all alcohol.  Keep in mind that different people's bodies react differently to different foods. You will probably learn over time which foods do or do not affect you. If you discover that a food tends to cause your gout to flare up, avoid eating that  food. You can more freely enjoy foods that do not cause problems. If you have any questions about a food item, talk to your dietitian or health care provider. °WHICH FOODS ARE LOW, MODERATE, AND HIGH IN PURINES? °The following is a list of foods that are low, moderate, and high in purines. You can eat any amount of the foods that are low in purines. You may be able to have small amounts of foods that are moderate in purines. Ask your health care provider how much of a food moderate in purines you can have. Avoid foods high in purines. °Grains °· Foods low in  purines: Enriched white bread, pasta, rice, cake, cornbread, popcorn. °· Foods moderate in purines: Whole-grain breads and cereals, wheat germ, bran, oatmeal. Uncooked oatmeal. Dry wheat bran or wheat germ. °· Foods high in purines: Pancakes, French toast, biscuits, muffins. °Vegetables °· Foods low in purines: All vegetables, except those that are moderate in purines. °· Foods moderate in purines: Asparagus, cauliflower, spinach, mushrooms, green peas. °Fruits °· All fruits are low in purines. °Meats and other Protein Foods °· Foods low in purines: Eggs, nuts, peanut butter. °· Foods moderate in purines: 80-90% lean beef, lamb, veal, pork, poultry, fish, eggs, peanut butter, nuts. Crab, lobster, oysters, and shrimp. Cooked dried beans, peas, and lentils. °· Foods high in purines: Anchovies, sardines, herring, mussels, tuna, codfish, scallops, trout, and haddock. Bacon. Organ meats (such as liver or kidney). Tripe. Game meat. Goose. Sweetbreads. °Dairy °· All dairy foods are low in purines. Low-fat and fat-free dairy products are best because they are low in saturated fat. °Beverages °· Drinks low in purines: Water, carbonated beverages, tea, coffee, cocoa. °· Drinks moderate in purines: Soft drinks and other drinks sweetened with high-fructose corn syrup. Juices. To find whether a food or drink is sweetened with high-fructose corn syrup, look at the ingredients list. °· Drinks high in purines: Alcoholic beverages (such as beer). °Condiments °· Foods low in purines: Salt, herbs, olives, pickles, relishes, vinegar. °· Foods moderate in purines: Butter, margarine, oils, mayonnaise. °Fats and Oils °· Foods low in purines: All types, except gravies and sauces made with meat. °· Foods high in purines: Gravies and sauces made with meat. °Other Foods °· Foods low in purines: Sugars, sweets, gelatin. Cake. Soups made without meat. °· Foods moderate in purines: Meat-based or fish-based soups, broths, or bouillons. Foods and  drinks sweetened with high-fructose corn syrup. °· Foods high in purines: High-fat desserts (such as ice cream, cookies, cakes, pies, doughnuts, and chocolate). °Contact your dietitian for more information on foods that are not listed here. °Document Released: 04/04/2011 Document Revised: 12/13/2013 Document Reviewed: 11/14/2013 °[employer]® Patient Information ©2015 [employer]. This information is not intended to replace advice given to you by your health care provider. Make sure you discuss any questions you have with your health care provider. ° °

## 2014-12-07 NOTE — ED Notes (Signed)
Patient c/o right wrist pain. Patient hx of gout in which she is taking colchicine. Patient staes the pain feels the same except "there is a sharp shooting pain" that radiates up arm.

## 2014-12-09 NOTE — ED Provider Notes (Signed)
CSN: [id number]     Arrival date & time 12/07/14  1553 History   First MD Initiated Contact with Patient 12/07/14 1621     Chief Complaint  Patient presents with  . Wrist Pain     (Consider location/radiation/quality/duration/timing/severity/associated sxs/prior Treatment) HPI   Jacqueline [name] is a 63 y.o. female who presents to the Emergency Department complaining of right wrist pain for several days.  She reports hx of gout in her wrist and reports pain is similar to previous flares except she is also having sharp stinging pains to her forearm.  She reports taking colchicine in the past without relief.  She has also tried ibuprofen.  She denies injury, redness, fever or chills   Past Medical History  Diagnosis Date  . Gastric nodule 2009    EUS, ?leiomyoma  . Irritable bowel syndrome   . History of pancreatitis     Biliary and/or etoh?  Marland Kitchen Anxiety   . Chronic back pain   . GERD (gastroesophageal reflux disease)   . Migraines   . 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 Peripheral neuropathy   . Knee pain   . Gout    Past Surgical History  Procedure Laterality Date  . Back surgery /ray cage fusion comberg, gso    . Toe graft    . Cesarean section    . 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  . Tonsillectomy    . Abdominal hysterectomy    . Dilation and curettage of uterus    . Gallbladder surgery  2010  . Eus  12/2008    Dr. Paulita Fujita. Gastric nodule most consistent with Leiomyoma. Slightly thickened and irregular gallbladder wall, possibly due to sludge or diminutive stones. Recommend EUS in January 2011 to followup gastric nodule.  . Esophagogastroduodenoscopy  11/2008    A 9-mm submucosal lesion seen in the cardia.,  gastritis, no hpylori  . Colonoscopy  2001    Dr. Laural Golden, hemorrhoids  . Cholecystectomy    . Colonoscopy  10/21/2011    UMP:NTIRWER polyp multiple/internal hemorrhoids  . Savory dilation  02/03/2012    XVQ:[id number] in the distal esophagus/Mild gastritis  . Back surgery    . Abdominal surgery    . Foot surgery     Family History  Problem Relation Age of Onset  . Colon cancer Mother     diagnosed in late 15s and died age 57  . Heart failure Mother   . Heart defect      family history   . Arthritis      family history  . COPD      family history   . Cancer      multiple unknown type  . Heart attack Father     deceased at 41  . Hypertension Father   . Heart failure Father   . Lung cancer Maternal Uncle   . Throat cancer Maternal Uncle   . Colon cancer Paternal Uncle   . Colon cancer Maternal Aunt   . Colon cancer Other   . Anesthesia problems Neg Hx   . Hypotension Neg Hx   . Malignant hyperthermia Neg Hx   . Pseudochol deficiency Neg Hx    History  Substance Use  Topics  . Smoking status: Current Every Day Smoker -- 0.50 packs/day for 35 years    Types: Cigarettes  . Smokeless tobacco: Never Used  . Alcohol Use: Yes     Comment: occasional holiday drink   OB History    Gravida Para Term Preterm AB TAB SAB Ectopic Multiple Living   3 2 2  1  1   2      Review of Systems  Constitutional: Negative for fever and chills.  Genitourinary: Negative for dysuria and difficulty urinating.  Musculoskeletal: Positive for joint swelling and arthralgias (right wrist pain).  Skin: Negative for color change and wound.  Neurological: Negative for weakness and numbness.  All other systems reviewed and are negative.     Allergies  Aspirin; Imitrex; Ketorolac tromethamine; Nsaids; Promethazine hcl; and Sulfonamide derivatives  Home Medications   Prior to Admission medications   Medication Sig Start Date End Date Taking? Authorizing Provider  colchicine 0.6 MG tablet Take  1 tablet (0.6 mg total) by mouth 2 (two) times daily. 10/05/14   Evalee Jefferson, PA-C  gabapentin (NEURONTIN) 300 MG capsule Take 600 mg by mouth 3 (three) times daily.    Historical Provider, MD  ibuprofen (ADVIL,MOTRIN) 200 MG tablet Take 400-800 mg by mouth every 8 (eight) hours as needed for moderate pain.    Historical Provider, MD  methocarbamol (ROBAXIN) 750 MG tablet Take 750 mg by mouth 4 (four) times daily.    Historical Provider, MD  omeprazole (PRILOSEC) 40 MG capsule Take 40 mg by mouth 2 (two) times daily before a meal.    Historical Provider, MD  oxyCODONE-acetaminophen (PERCOCET/ROXICET) 5-325 MG per tablet Take 1 tablet by mouth every 4 (four) hours as needed. 12/07/14   Waverly Tarquinio L. Mykai Wendorf, PA-C  predniSONE (DELTASONE) 10 MG tablet Take 6 tablets day one, 5 tablets day two, 4 tablets day three, 3 tablets day four, 2 tablets day five, then 1 tablet day six 12/07/14   Ermal Brzozowski L. Coco Sharpnack, PA-C   BP 168/92 mmHg  Pulse 83  Temp(Src) 99.6 F (37.6 C) (Oral)  Resp 16  Ht 5\' 2"  (1.575 m)  Wt 112 lb (50.803 kg)  BMI 20.48 kg/m2  SpO2 100% Physical Exam  Constitutional: She is oriented to person, place, and time. She appears well-developed and well-nourished. No distress.  HENT:  Head: Normocephalic and atraumatic.  Cardiovascular: Normal rate, regular rhythm and normal heart sounds.   No murmur heard. Pulmonary/Chest: Effort normal and breath sounds normal. No respiratory distress.  Musculoskeletal: She exhibits edema and tenderness.  Diffuse tenderness and STS of the distal right wrist.  No erythema.  Radial pulse is brisk, distal sensation intact.  CR< 2 sec.  No bruising or bony deformity.   Compartments soft.  Neurological: She is alert and oriented to person, place, and time. She exhibits normal muscle tone. Coordination normal.  Skin: Skin is warm and dry.  Nursing note and vitals reviewed.   ED Course  Procedures (including critical care time) Labs Review Labs Reviewed - No  data to display  Imaging Review No results found.   EKG Interpretation None      MDM   Final diagnoses:  Right wrist pain  Acute gout of right wrist, unspecified cause    Pt is well appearing.  VSS.  Tenderness of the right wrist pain with mild STS.  No concerning sx's for septic joint. NV intact.  Has hx of gout and reports sx's are similar to previous.  She agrees to close  f/u with her PMD for recheck.  rx for percocet and prednisone    Mario Voong L. Vanessa Ellsworth, PA-C 12/09/14 [place], MD 12/11/14 [phone]

## 2014-12-18 ENCOUNTER — Emergency Department (HOSPITAL_COMMUNITY)
Admission: EM | Admit: 2014-12-18 | Discharge: 2014-12-18 | Disposition: A | Discharge status: Home / Self Care | Payer: Medicaid Other | Attending: Emergency Medicine | Admitting: Emergency Medicine

## 2014-12-18 ENCOUNTER — Encounter (HOSPITAL_COMMUNITY): Payer: Self-pay | Admitting: Emergency Medicine

## 2014-12-18 DIAGNOSIS — F419 Anxiety disorder, unspecified: Secondary | ICD-10-CM | POA: Insufficient documentation

## 2014-12-18 DIAGNOSIS — Z79899 Other long term (current) drug therapy: Secondary | ICD-10-CM | POA: Diagnosis not present

## 2014-12-18 DIAGNOSIS — Z720 Tobacco use: Secondary | ICD-10-CM | POA: Diagnosis not present

## 2014-12-18 DIAGNOSIS — Z8719 Personal history of other diseases of the digestive system: Secondary | ICD-10-CM | POA: Diagnosis not present

## 2014-12-18 DIAGNOSIS — G8929 Other chronic pain: Secondary | ICD-10-CM | POA: Insufficient documentation

## 2014-12-18 DIAGNOSIS — K219 Gastro-esophageal reflux disease without esophagitis: Secondary | ICD-10-CM | POA: Diagnosis not present

## 2014-12-18 DIAGNOSIS — G43009 Migraine without aura, not intractable, without status migrainosus: Secondary | ICD-10-CM

## 2014-12-18 DIAGNOSIS — Z7952 Long term (current) use of systemic steroids: Secondary | ICD-10-CM | POA: Insufficient documentation

## 2014-12-18 DIAGNOSIS — G43909 Migraine, unspecified, not intractable, without status migrainosus: Secondary | ICD-10-CM | POA: Diagnosis not present

## 2014-12-18 DIAGNOSIS — Z8739 Personal history of other diseases of the musculoskeletal system and connective tissue: Secondary | ICD-10-CM | POA: Insufficient documentation

## 2014-12-18 MED ORDER — HYDROMORPHONE HCL 1 MG/ML IJ SOLN
1.0000 mg | Freq: Once | INTRAMUSCULAR | Status: AC
  Administered 2014-12-18: 1 mg via INTRAMUSCULAR
  Filled 2014-12-18: qty 1

## 2014-12-18 MED ORDER — ONDANSETRON 4 MG PO TBDP
4.0000 mg | ORAL_TABLET | Freq: Once | ORAL | Status: AC
  Administered 2014-12-18: 4 mg via ORAL
  Filled 2014-12-18: qty 1

## 2014-12-18 NOTE — ED Provider Notes (Signed)
This chart was scribed for [place], DO by Forrestine Him, ED Scribe. This patient was seen in room APA18/APA18 and the patient's care was started 11:56 AM.   TIME SEEN: 11:56 AM   CHIEF COMPLAINT:  Chief Complaint  Patient presents with  . Migraine     HPI:  HPI Comments: Jacqueline [name] is a 63 y.o. female with a PMHx of migraine headaches, irritable bowel syndrome and GERD who presents to the Emergency Department complaining of constant, moderate HA located behind the R eye onset yesterday. No recent head injury. Pt also reports intermittent nausea, vomiting, photophobia, and phonophobia. Ms. Mccosh states current symptoms feels similar to what she experiences with a Migraine. No recent fever or chills. Pt states she has been given Dilaudid and Benadryl for symptoms while being treated in ED. Pt with several known allergies to medications as listed below.  ROS: See HPI Constitutional: no fever  Eyes: no drainage. Positive for photophobia ENT: no runny nose   Cardiovascular:  no chest pain  Resp: no SOB  GI: Positive for nausea and vomiting GU: no dysuria Integumentary: no rash  Allergy: no hives  Musculoskeletal: no leg swelling  Neurological: no slurred speech. Positive for headache ROS otherwise negative  PAST MEDICAL HISTORY/PAST SURGICAL HISTORY:  Past Medical History  Diagnosis Date  . Gastric nodule 2009    EUS, ?leiomyoma  . Irritable bowel syndrome   . History of pancreatitis     Biliary and/or etoh?  Marland Kitchen Anxiety   . Chronic back pain   . GERD (gastroesophageal reflux disease)   . Migraines   . 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 Peripheral neuropathy   . Knee pain   . Gout   . Migraines     MEDICATIONS:  Prior to Admission medications   Medication Sig Start Date End Date Taking? Authorizing Provider  colchicine 0.6 MG tablet Take 1 tablet (0.6 mg total) by  mouth 2 (two) times daily. 10/05/14   Evalee Jefferson, PA-C  gabapentin (NEURONTIN) 300 MG capsule Take 600 mg by mouth 3 (three) times daily.    Historical Provider, MD  ibuprofen (ADVIL,MOTRIN) 200 MG tablet Take 400-800 mg by mouth every 8 (eight) hours as needed for moderate pain.    Historical Provider, MD  methocarbamol (ROBAXIN) 750 MG tablet Take 750 mg by mouth 4 (four) times daily.    Historical Provider, MD  omeprazole (PRILOSEC) 40 MG capsule Take 40 mg by mouth 2 (two) times daily before a meal.    Historical Provider, MD  oxyCODONE-acetaminophen (PERCOCET/ROXICET) 5-325 MG per tablet Take 1 tablet by mouth every 4 (four) hours as needed. 12/07/14   Tammy L. Triplett, PA-C  predniSONE (DELTASONE) 10 MG tablet Take 6 tablets day one, 5 tablets day two, 4 tablets day three, 3 tablets day four, 2 tablets day five, then 1 tablet day six 12/07/14   Tammy L. Triplett, PA-C    ALLERGIES:  Allergies  Allergen Reactions  . Aspirin Other (See Comments)    Has had part of stomach removed, cannot take aspirin   . Imitrex [Sumatriptan Base] Other (See Comments)    Fast heart rate  . Ketorolac Tromethamine Other (See Comments)    "makes my hands draw up"  . Nsaids Other (See Comments)    Due to stomach issues (has had part of stomach removed, cannot take NSAIDS OR ASA)  .  Promethazine Hcl Other (See Comments)    Legs jerking -" like restless legs"  . Sulfonamide Derivatives Nausea And Vomiting    Lost consciousness     SOCIAL HISTORY:  History  Substance Use Topics  . Smoking status: Current Every Day Smoker -- 0.50 packs/day for 35 years    Types: Cigarettes  . Smokeless tobacco: Never Used  . Alcohol Use: Yes     Comment: occasional holiday drink    FAMILY HISTORY: Family History  Problem Relation Age of Onset  . Colon cancer Mother     diagnosed in late 62s and died age 93  . Heart failure Mother   . Heart defect      family history   . Arthritis      family history  .  COPD      family history   . Cancer      multiple unknown type  . Heart attack Father     deceased at 71  . Hypertension Father   . Heart failure Father   . Lung cancer Maternal Uncle   . Throat cancer Maternal Uncle   . Colon cancer Paternal Uncle   . Colon cancer Maternal Aunt   . Colon cancer Other   . Anesthesia problems Neg Hx   . Hypotension Neg Hx   . Malignant hyperthermia Neg Hx   . Pseudochol deficiency Neg Hx     EXAM: BP 140/85 mmHg  Pulse 63  Temp(Src) 98.6 F (37 C) (Oral)  Resp 20  Ht 5\' 2"  (1.575 m)  Wt 115 lb (52.164 kg)  BMI 21.03 kg/m2  SpO2 100% CONSTITUTIONAL: Alert and oriented and responds appropriately to questions. Well-appearing; well-nourished, appears uncomfortable but is nontoxic appearing HEAD: Normocephalic EYES: Conjunctivae clear, PERRL; photophobia ENT: normal nose; no rhinorrhea; moist mucous membranes; pharynx without lesions noted NECK: Supple, no meningismus, no LAD  CARD: RRR; S1 and S2 appreciated; no murmurs, no clicks, no rubs, no gallops RESP: Normal chest excursion without splinting or tachypnea; breath sounds clear and equal bilaterally; no wheezes, no rhonchi, no rales,  ABD/GI: Normal bowel sounds; non-distended; soft, non-tender, no rebound, no guarding BACK:  The back appears normal and is non-tender to palpation, there is no CVA tenderness EXT: Normal ROM in all joints; non-tender to palpation; no edema; normal capillary refill; no cyanosis    SKIN: Normal color for age and race; warm NEURO: Moves all extremities equally; sensation to light touch; cranial nerves 2-12 intact; strength 5/5 in all 4 extremities, normal gait PSYCH: The patient's mood and manner are appropriate. Grooming and personal hygiene are appropriate.  MEDICAL DECISION MAKING: Patient here with her typical migraine headache. She is neurologically intact. We'll give Dilaudid and Zofran and reassess. She does appear uncomfortable on exam and is actively  vomiting.  ED PROGRESS: Patient reports her symptoms have markedly improved after one dose of IM Dilaudid and oral Zofran. She's been able to tolerate by mouth. She is requesting discharge home. Discussed return precautions. Discussed supportive care instructions and importance of PCP follow-up. She verbalizes understanding and is comfortable with plan.   I personally performed the services described in this documentation, which was scribed in my presence. The recorded information has been reviewed and is accurate.     [place], DO 12/18/14 [phone]

## 2014-12-18 NOTE — Discharge Instructions (Signed)

## 2014-12-18 NOTE — ED Notes (Signed)
Pt reports headache since yesterday. Pt reports intermittent n/v. Pt also reports light and sound sensitivity. nad noted. Pt reports history migraines.

## 2015-01-26 ENCOUNTER — Encounter (HOSPITAL_COMMUNITY): Payer: Self-pay | Admitting: *Deleted

## 2015-01-26 ENCOUNTER — Emergency Department (HOSPITAL_COMMUNITY): Payer: Medicaid Other

## 2015-01-26 ENCOUNTER — Emergency Department (HOSPITAL_COMMUNITY)
Admission: EM | Admit: 2015-01-26 | Discharge: 2015-01-26 | Disposition: A | Discharge status: Home / Self Care | Payer: Medicaid Other | Attending: Emergency Medicine | Admitting: Emergency Medicine

## 2015-01-26 DIAGNOSIS — Z720 Tobacco use: Secondary | ICD-10-CM | POA: Insufficient documentation

## 2015-01-26 DIAGNOSIS — G43909 Migraine, unspecified, not intractable, without status migrainosus: Secondary | ICD-10-CM | POA: Diagnosis not present

## 2015-01-26 DIAGNOSIS — M1991 Primary osteoarthritis, unspecified site: Secondary | ICD-10-CM | POA: Diagnosis not present

## 2015-01-26 DIAGNOSIS — G8929 Other chronic pain: Secondary | ICD-10-CM | POA: Insufficient documentation

## 2015-01-26 DIAGNOSIS — K219 Gastro-esophageal reflux disease without esophagitis: Secondary | ICD-10-CM | POA: Diagnosis not present

## 2015-01-26 DIAGNOSIS — M150 Primary generalized (osteo)arthritis: Secondary | ICD-10-CM

## 2015-01-26 DIAGNOSIS — M25531 Pain in right wrist: Secondary | ICD-10-CM | POA: Insufficient documentation

## 2015-01-26 DIAGNOSIS — M109 Gout, unspecified: Secondary | ICD-10-CM | POA: Insufficient documentation

## 2015-01-26 DIAGNOSIS — M159 Polyosteoarthritis, unspecified: Secondary | ICD-10-CM

## 2015-01-26 DIAGNOSIS — F419 Anxiety disorder, unspecified: Secondary | ICD-10-CM | POA: Insufficient documentation

## 2015-01-26 DIAGNOSIS — Z79899 Other long term (current) drug therapy: Secondary | ICD-10-CM | POA: Insufficient documentation

## 2015-01-26 DIAGNOSIS — Z86018 Personal history of other benign neoplasm: Secondary | ICD-10-CM | POA: Diagnosis not present

## 2015-01-26 DIAGNOSIS — M25512 Pain in left shoulder: Secondary | ICD-10-CM | POA: Diagnosis not present

## 2015-01-26 DIAGNOSIS — R109 Unspecified abdominal pain: Secondary | ICD-10-CM | POA: Diagnosis not present

## 2015-01-26 MED ORDER — DEXAMETHASONE 6 MG PO TABS: ORAL_TABLET | ORAL | Status: DC

## 2015-01-26 MED ORDER — HYDROCODONE-ACETAMINOPHEN 5-325 MG PO TABS
2.0000 | ORAL_TABLET | Freq: Once | ORAL | Status: AC
Start: 2015-01-26 — End: 2015-01-26
  Administered 2015-01-26: 2 via ORAL
  Filled 2015-01-26: qty 2

## 2015-01-26 MED ORDER — HYDROCODONE-ACETAMINOPHEN 5-325 MG PO TABS: 1.0000 | ORAL_TABLET | ORAL | Status: DC | PRN

## 2015-01-26 MED ORDER — ONDANSETRON HCL 4 MG PO TABS
4.0000 mg | ORAL_TABLET | Freq: Once | ORAL | Status: AC
  Administered 2015-01-26: 4 mg via ORAL
  Filled 2015-01-26: qty 1

## 2015-01-26 MED ORDER — COLCHICINE 0.6 MG PO TABS
0.6000 mg | ORAL_TABLET | Freq: Once | ORAL | Status: AC
  Administered 2015-01-26: 0.6 mg via ORAL
  Filled 2015-01-26: qty 1

## 2015-01-26 NOTE — ED Notes (Signed)
Patient given discharge instructions and prescriptions, teach back method used. Patient aware of possible side effects including drowsiness and that she should not drive while taking medication. No questions. No complaints at time of discharge. Patient instructed to stop at registration window to complete any additional paperwork. Patient left ED with daughter to drive her home.

## 2015-01-26 NOTE — ED Provider Notes (Signed)
CSN: [id number]     Arrival date & time 01/26/15  0945 History   First MD Initiated Contact with Patient 01/26/15 1053     Chief Complaint  Patient presents with  . Shoulder Pain  . Wrist Pain     (Consider location/radiation/quality/duration/timing/severity/associated sxs/prior Treatment) HPI Comments: Patient is a 64 year old female who presents to the emergency department with a complaint of right wrist pain and left shoulder pain.  Patient states she was working with her dog when the dog jerked and pulled her by its chain. She sustained injury to the right wrist, and to the left shoulder. This occurred approximately one week ago. The patient states she is still having discomfort and wanted to be checked out. She could not see her primary physician and came to the emergency department for evaluation. The patient states that she has a history of gout thinks that the wrist pain may be more gout related than anything else.  The history is provided by the patient.    Past Medical History  Diagnosis Date  . Gastric nodule 2009    EUS, ?leiomyoma  . Irritable bowel syndrome   . History of pancreatitis     Biliary and/or etoh?  Marland Kitchen Anxiety   . Chronic back pain   . GERD (gastroesophageal reflux disease)   . Migraines   . 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 Peripheral neuropathy   . Knee pain   . Gout   . Migraines    Past Surgical History  Procedure Laterality Date  . Back surgery /ray cage fusion comberg, gso    . Toe graft    . Cesarean section    . 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  . Tonsillectomy    . Abdominal hysterectomy    . Dilation and curettage of uterus    . Gallbladder surgery  2010  . Eus  12/2008    Dr.  Paulita Fujita. Gastric nodule most consistent with Leiomyoma. Slightly thickened and irregular gallbladder wall, possibly due to sludge or diminutive stones. Recommend EUS in January 2011 to followup gastric nodule.  . Esophagogastroduodenoscopy  11/2008    A 9-mm submucosal lesion seen in the cardia., gastritis, no hpylori  . Colonoscopy  2001    Dr. Laural Golden, hemorrhoids  . Cholecystectomy    . Colonoscopy  10/21/2011    NWG:NFAOZHY polyp multiple/internal hemorrhoids  . Savory dilation  02/03/2012    QMV:HQIONGEXB in the distal esophagus/Mild gastritis  . Back surgery    . Abdominal surgery    . Foot surgery     Family History  Problem Relation Age of Onset  . Colon cancer Mother     diagnosed in late 27s and died age 67  . Heart failure Mother   . Heart defect      family history   . Arthritis      family history  . COPD      family history   . Cancer      multiple unknown type  . Heart attack Father     deceased at 58  . Hypertension Father   . Heart failure Father   . Lung cancer Maternal Uncle   . Throat cancer Maternal Uncle   .  Colon cancer Paternal Uncle   . Colon cancer Maternal Aunt   . Colon cancer Other   . Anesthesia problems Neg Hx   . Hypotension Neg Hx   . Malignant hyperthermia Neg Hx   . Pseudochol deficiency Neg Hx    History  Substance Use Topics  . Smoking status: Current Every Day Smoker -- 0.50 packs/day for 35 years    Types: Cigarettes  . Smokeless tobacco: Never Used  . Alcohol Use: Yes     Comment: occasional holiday drink   OB History    Gravida Para Term Preterm AB TAB SAB Ectopic Multiple Living   3 2 2  1  1   2      Review of Systems  Constitutional: Negative for activity change.       All ROS Neg except as noted in HPI  HENT: Negative for nosebleeds.   Eyes: Negative for photophobia and discharge.  Respiratory: Negative for cough, shortness of breath and wheezing.   Cardiovascular: Negative for chest pain and palpitations.   Gastrointestinal: Positive for abdominal pain. Negative for blood in stool.  Genitourinary: Negative for dysuria, frequency and hematuria.  Musculoskeletal: Positive for back pain. Negative for arthralgias and neck pain.  Skin: Negative.   Neurological: Positive for headaches. Negative for dizziness, seizures and speech difficulty.  Psychiatric/Behavioral: Negative for hallucinations and confusion. The patient is nervous/anxious.       Allergies  Aspirin; Imitrex; Ketorolac tromethamine; Nsaids; Promethazine hcl; and Sulfonamide derivatives  Home Medications   Prior to Admission medications   Medication Sig Start Date End Date Taking? Authorizing Provider  colchicine 0.6 MG tablet Take 1 tablet (0.6 mg total) by mouth 2 (two) times daily. 10/05/14  Yes Almyra Free Idol, PA-C  gabapentin (NEURONTIN) 300 MG capsule Take 600 mg by mouth 3 (three) times daily.   Yes Historical Provider, MD  ibuprofen (ADVIL,MOTRIN) 200 MG tablet Take 400-800 mg by mouth every 8 (eight) hours as needed for moderate pain.   Yes Historical Provider, MD  methocarbamol (ROBAXIN) 750 MG tablet Take 750 mg by mouth 4 (four) times daily.   Yes Historical Provider, MD  omeprazole (PRILOSEC) 40 MG capsule Take 40 mg by mouth 2 (two) times daily before a meal.   Yes Historical Provider, MD  oxyCODONE-acetaminophen (PERCOCET/ROXICET) 5-325 MG per tablet Take 1 tablet by mouth every 4 (four) hours as needed. Patient not taking: Reported on 12/18/2014 12/07/14   Tammy L. Triplett, PA-C  predniSONE (DELTASONE) 10 MG tablet Take 6 tablets day one, 5 tablets day two, 4 tablets day three, 3 tablets day four, 2 tablets day five, then 1 tablet day six Patient not taking: Reported on 12/18/2014 12/07/14   Tammy L. Triplett, PA-C   BP 140/103 mmHg  Pulse 81  Temp(Src) 99.1 F (37.3 C) (Oral)  Resp 16  Ht 5\' 2"  (1.575 m)  Wt 114 lb (51.71 kg)  BMI 20.85 kg/m2  SpO2 99% Physical Exam  Constitutional: She is oriented to person,  place, and time. She appears well-developed and well-nourished.  Non-toxic appearance.  HENT:  Head: Normocephalic.  Right Ear: Tympanic membrane and external ear normal.  Left Ear: Tympanic membrane and external ear normal.  Eyes: EOM and lids are normal. Pupils are equal, round, and reactive to light.  Neck: Normal range of motion. Neck supple. Carotid bruit is not present.  Cardiovascular: Normal rate, regular rhythm, normal heart sounds, intact distal pulses and normal pulses.   Pulmonary/Chest: Breath sounds normal. No respiratory distress.  Abdominal: Soft. Bowel sounds are normal. There is no tenderness. There is no guarding.  Musculoskeletal: Normal range of motion.  Right wrist pain. Right wrist not hot  Left shoulder pain. No dislocation. Distal pulses 2+ bilat.  Lymphadenopathy:       Head (right side): No submandibular adenopathy present.       Head (left side): No submandibular adenopathy present.    She has no cervical adenopathy.  Neurological: She is alert and oriented to person, place, and time. She has normal strength. No cranial nerve deficit or sensory deficit.  Skin: Skin is warm and dry.  Psychiatric: She has a normal mood and affect. Her speech is normal.  Nursing note and vitals reviewed.   ED Course  Procedures (including critical care time) Labs Review Labs Reviewed - No data to display  Imaging Review Dg Shoulder Left  01/26/2015   CLINICAL DATA:  Pain following jerking type injury while walking dog  EXAM: LEFT SHOULDER - 2+ VIEW  COMPARISON:  July 28, 2010  FINDINGS: Frontal, Y scapular, and axillary images were obtained. There is no acute fracture dislocation. There is moderate generalized osteoarthritic change. No erosive change or intra-articular calcification.  IMPRESSION: Moderate generalized osteoarthritic change. No fracture or dislocation.   Electronically Signed   By: Lowella Grip M.D.   On: 01/26/2015 10:16     EKG Interpretation None       MDM  X-ray of the left shoulder reveals moderate osteoarthritis, but no fracture and no dislocation. Vital signs were reviewed. Blood pressure was elevated at 140/103. No gross neurologic or vascular deficits appreciated. Prescription for Decadron 6 mg and Norco 5 mg given to the patient. The patient is to follow with Dr. Sherrie Sport next week.    Final diagnoses:  Shoulder pain, left  Wrist pain, right  Primary osteoarthritis involving multiple joints    **I have reviewed nursing notes, vital signs, and all appropriate lab and imaging results for this patient.Lenox Ahr, PA-C 01/26/15 [place], MD 01/29/15 0800

## 2015-01-26 NOTE — ED Notes (Signed)
Pt states left shoulder pain after arm was pulled back while chaining her dog. Pt states pain to right wrist, which patient states is due to gout. Pt states heard a pop to her shoulder when it occurred, which was a week ago.

## 2015-01-26 NOTE — Discharge Instructions (Signed)
No fracture or dislocation noted on your x-ray exam. No vascular or neurologic changes noted on your examination. Please see Dr. Sherrie Sport for management of this problem. Arthritis, Nonspecific Arthritis is inflammation of a joint. This usually means pain, redness, warmth or swelling are present. One or more joints may be involved. There are a number of types of arthritis. Your caregiver may not be able to tell what type of arthritis you have right away. CAUSES  The most common cause of arthritis is the wear and tear on the joint (osteoarthritis). This causes damage to the cartilage, which can break down over time. The knees, hips, back and neck are most often affected by this type of arthritis. Other types of arthritis and common causes of joint pain include:  Sprains and other injuries near the joint. Sometimes minor sprains and injuries cause pain and swelling that develop hours later.  Rheumatoid arthritis. This affects hands, feet and knees. It usually affects both sides of your body at the same time. It is often associated with chronic ailments, fever, weight loss and general weakness.  Crystal arthritis. Gout and pseudo gout can cause occasional acute severe pain, redness and swelling in the foot, ankle, or knee.  Infectious arthritis. Bacteria can get into a joint through a break in overlying skin. This can cause infection of the joint. Bacteria and viruses can also spread through the blood and affect your joints.  Drug, infectious and allergy reactions. Sometimes joints can become mildly painful and slightly swollen with these types of illnesses. SYMPTOMS   Pain is the main symptom.  Your joint or joints can also be red, swollen and warm or hot to the touch.  You may have a fever with certain types of arthritis, or even feel overall ill.  The joint with arthritis will hurt with movement. Stiffness is present with some types of arthritis. DIAGNOSIS  Your caregiver will suspect  arthritis based on your description of your symptoms and on your exam. Testing may be needed to find the type of arthritis:  Blood and sometimes urine tests.  X-ray tests and sometimes CT or MRI scans.  Removal of fluid from the joint (arthrocentesis) is done to check for bacteria, crystals or other causes. Your caregiver (or a specialist) will numb the area over the joint with a local anesthetic, and use a needle to remove joint fluid for examination. This procedure is only minimally uncomfortable.  Even with these tests, your caregiver may not be able to tell what kind of arthritis you have. Consultation with a specialist (rheumatologist) may be helpful. TREATMENT  Your caregiver will discuss with you treatment specific to your type of arthritis. If the specific type cannot be determined, then the following general recommendations may apply. Treatment of severe joint pain includes:  Rest.  Elevation.  Anti-inflammatory medication (for example, ibuprofen) may be prescribed. Avoiding activities that cause increased pain.  Only take over-the-counter or prescription medicines for pain and discomfort as recommended by your caregiver.  Cold packs over an inflamed joint may be used for 10 to 15 minutes every hour. Hot packs sometimes feel better, but do not use overnight. Do not use hot packs if you are diabetic without your caregiver's permission.  A cortisone shot into arthritic joints may help reduce pain and swelling.  Any acute arthritis that gets worse over the next 1 to 2 days needs to be looked at to be sure there is no joint infection. Long-term arthritis treatment involves modifying activities and lifestyle  to reduce joint stress jarring. This can include weight loss. Also, exercise is needed to nourish the joint cartilage and remove waste. This helps keep the muscles around the joint strong. HOME CARE INSTRUCTIONS   Do not take aspirin to relieve pain if gout is suspected. This  elevates uric acid levels.  Only take over-the-counter or prescription medicines for pain, discomfort or fever as directed by your caregiver.  Rest the joint as much as possible.  If your joint is swollen, keep it elevated.  Use crutches if the painful joint is in your leg.  Drinking plenty of fluids may help for certain types of arthritis.  Follow your caregiver's dietary instructions.  Try low-impact exercise such as:  Swimming.  Water aerobics.  Biking.  Walking.  Morning stiffness is often relieved by a warm shower.  Put your joints through regular range-of-motion. SEEK MEDICAL CARE IF:   You do not feel better in 24 hours or are getting worse.  You have side effects to medications, or are not getting better with treatment. SEEK IMMEDIATE MEDICAL CARE IF:   You have a fever.  You develop severe joint pain, swelling or redness.  Many joints are involved and become painful and swollen.  There is severe back pain and/or leg weakness.  You have loss of bowel or bladder control. Document Released: 01/15/2005 Document Revised: 03/01/2012 Document Reviewed: 01/31/2009 [facility] Patient Information 2015 [place], [place]. This information is not intended to replace advice given to you by your health care provider. Make sure you discuss any questions you have with your health care provider.

## 2015-03-19 ENCOUNTER — Encounter (HOSPITAL_COMMUNITY): Payer: Self-pay | Admitting: Emergency Medicine

## 2015-03-19 ENCOUNTER — Emergency Department (HOSPITAL_COMMUNITY): Payer: Medicaid Other

## 2015-03-19 ENCOUNTER — Emergency Department (HOSPITAL_COMMUNITY)
Admission: EM | Admit: 2015-03-19 | Discharge: 2015-03-19 | Disposition: A | Discharge status: Home / Self Care | Payer: Medicaid Other | Attending: Emergency Medicine | Admitting: Emergency Medicine

## 2015-03-19 DIAGNOSIS — F419 Anxiety disorder, unspecified: Secondary | ICD-10-CM | POA: Diagnosis not present

## 2015-03-19 DIAGNOSIS — Z720 Tobacco use: Secondary | ICD-10-CM | POA: Insufficient documentation

## 2015-03-19 DIAGNOSIS — G43909 Migraine, unspecified, not intractable, without status migrainosus: Secondary | ICD-10-CM | POA: Insufficient documentation

## 2015-03-19 DIAGNOSIS — G8929 Other chronic pain: Secondary | ICD-10-CM | POA: Diagnosis not present

## 2015-03-19 DIAGNOSIS — K219 Gastro-esophageal reflux disease without esophagitis: Secondary | ICD-10-CM | POA: Insufficient documentation

## 2015-03-19 DIAGNOSIS — M778 Other enthesopathies, not elsewhere classified: Secondary | ICD-10-CM | POA: Diagnosis not present

## 2015-03-19 DIAGNOSIS — G629 Polyneuropathy, unspecified: Secondary | ICD-10-CM | POA: Diagnosis not present

## 2015-03-19 DIAGNOSIS — Z79899 Other long term (current) drug therapy: Secondary | ICD-10-CM | POA: Insufficient documentation

## 2015-03-19 DIAGNOSIS — Z86018 Personal history of other benign neoplasm: Secondary | ICD-10-CM | POA: Diagnosis not present

## 2015-03-19 DIAGNOSIS — M79642 Pain in left hand: Secondary | ICD-10-CM | POA: Diagnosis present

## 2015-03-19 MED ORDER — HYDROCODONE-ACETAMINOPHEN 5-325 MG PO TABS
ORAL_TABLET | ORAL | Status: DC
Start: 2015-03-19 — End: 2015-04-24

## 2015-03-19 MED ORDER — PREDNISONE 20 MG PO TABS: 40.0000 mg | ORAL_TABLET | Freq: Every day | ORAL | Status: DC

## 2015-03-19 MED ORDER — HYDROCODONE-ACETAMINOPHEN 5-325 MG PO TABS
1.0000 | ORAL_TABLET | Freq: Once | ORAL | Status: AC
  Administered 2015-03-19: 1 via ORAL
  Filled 2015-03-19: qty 1

## 2015-03-19 NOTE — ED Provider Notes (Signed)
CSN: [id number]     Arrival date & time 03/19/15  1056 History   This chart was scribed for non-physician practitioner, Kem Parkinson, PA-C, working with Virgel Manifold, MD, by Chester Holstein, ED Scribe. This patient was seen in room APFT22/APFT22 and the patient's care was started at 12:26 PM.      Chief Complaint  Patient presents with  . Hand Problem      The history is provided by the patient. No language interpreter was used.    HPI Comments: Jacqueline [name] is a 64 y.o. female who presents to the Emergency Department complaining of left hand pain with onset 2 days ago. Pt states Jacqueline [name] was helping a friend with some tasks at home and using her hands a lot at onset. Pt notes associated swelling and redness with onset yesterday and finger pain. Pt notes pain is at its worst with wrist extension and movement of index finger. Pt with h/o of gout in right hand but states Jacqueline [name] has not had similar swelling and pain in left hand before. Pt denies any recent injury, fever, chills, numbness, weakness and any other pain.   Past Medical History  Diagnosis Date  . Gastric nodule 2009    EUS, ?leiomyoma  . Irritable bowel syndrome   . History of pancreatitis     Biliary and/or etoh?  Marland Kitchen Anxiety   . Chronic back pain   . GERD (gastroesophageal reflux disease)   . Migraines   . 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 Peripheral neuropathy   . Knee pain   . Gout   . Migraines    Past Surgical History  Procedure Laterality Date  . Back surgery /ray cage fusion comberg, gso    . Toe graft    . Cesarean section    . 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  . Tonsillectomy    . Abdominal hysterectomy    . Dilation and curettage of uterus    .  Gallbladder surgery  2010  . Eus  12/2008    Dr. Paulita Fujita. Gastric nodule most consistent with Leiomyoma. Slightly thickened and irregular gallbladder wall, possibly due to sludge or diminutive stones. Recommend EUS in January 2011 to followup gastric nodule.  . Esophagogastroduodenoscopy  11/2008    A 9-mm submucosal lesion seen in the cardia., gastritis, no hpylori  . Colonoscopy  2001    Dr. Laural Golden, hemorrhoids  . Cholecystectomy    . Colonoscopy  10/21/2011    VOZ:DGUYQIH polyp multiple/internal hemorrhoids  . Savory dilation  02/03/2012    KVQ:QVZDGLOVF in the distal esophagus/Mild gastritis  . Back surgery    . Abdominal surgery    . Foot surgery     Family History  Problem Relation Age of Onset  . Colon cancer Mother     diagnosed in late 72s and died age 58  . Heart failure Mother   . Heart defect      family history   . Arthritis      family history  . COPD      family history   . Cancer      multiple unknown type  . Heart attack Father     deceased at 60  .  Hypertension Father   . Heart failure Father   . Lung cancer Maternal Uncle   . Throat cancer Maternal Uncle   . Colon cancer Paternal Uncle   . Colon cancer Maternal Aunt   . Colon cancer Other   . Anesthesia problems Neg Hx   . Hypotension Neg Hx   . Malignant hyperthermia Neg Hx   . Pseudochol deficiency Neg Hx    History  Substance Use Topics  . Smoking status: Current Every Day Smoker -- 0.50 packs/day for 35 years    Types: Cigarettes  . Smokeless tobacco: Never Used  . Alcohol Use: Yes     Comment: occasional holiday drink   OB History    Gravida Para Term Preterm AB TAB SAB Ectopic Multiple Living   3 2 2  1  1   2      Review of Systems  Constitutional: Negative for fever, chills and fatigue.  Gastrointestinal: Negative for nausea and vomiting.  Musculoskeletal: Positive for joint swelling and arthralgias (left hand and wrist pain and swelling).  Skin: Positive for color change. Negative  for wound.  Neurological: Negative for dizziness, weakness and numbness.  All other systems reviewed and are negative.     Allergies  Aspirin; Imitrex; Ketorolac tromethamine; Nsaids; Promethazine hcl; and Sulfonamide derivatives  Home Medications   Prior to Admission medications   Medication Sig Start Date End Date Taking? Authorizing Provider  colchicine 0.6 MG tablet Take 1 tablet (0.6 mg total) by mouth 2 (two) times daily. 10/05/14   Evalee Jefferson, PA-C  dexamethasone (DECADRON) 6 MG tablet 1 po bid with food 01/26/15   Lily Kocher, PA-C  gabapentin (NEURONTIN) 300 MG capsule Take 600 mg by mouth 3 (three) times daily.    Historical Provider, MD  HYDROcodone-acetaminophen (NORCO/VICODIN) 5-325 MG per tablet Take 1 tablet by mouth every 4 (four) hours as needed. 01/26/15   Lily Kocher, PA-C  ibuprofen (ADVIL,MOTRIN) 200 MG tablet Take 400-800 mg by mouth every 8 (eight) hours as needed for moderate pain.    Historical Provider, MD  methocarbamol (ROBAXIN) 750 MG tablet Take 750 mg by mouth 4 (four) times daily.    Historical Provider, MD  omeprazole (PRILOSEC) 40 MG capsule Take 40 mg by mouth 2 (two) times daily before a meal.    Historical Provider, MD  oxyCODONE-acetaminophen (PERCOCET/ROXICET) 5-325 MG per tablet Take 1 tablet by mouth every 4 (four) hours as needed. Patient not taking: Reported on 12/18/2014 12/07/14   Tammi Dahiana Kulak, PA-C  predniSONE (DELTASONE) 10 MG tablet Take 6 tablets day one, 5 tablets day two, 4 tablets day three, 3 tablets day four, 2 tablets day five, then 1 tablet day six Patient not taking: Reported on 12/18/2014 12/07/14   Tammi Brandy Kabat, PA-C   BP 131/89 mmHg  Pulse 78  Temp(Src) 98.4 F (36.9 C) (Oral)  Resp 18  Ht 5\' 2"  (1.575 m)  Wt 110 lb (49.896 kg)  BMI 20.11 kg/m2  SpO2 99% Physical Exam  Constitutional: Jacqueline [name] is oriented to person, place, and time. Jacqueline [name] appears well-developed and well-nourished.  HENT:  Head: Normocephalic.  Eyes:  Conjunctivae are normal.  Neck: Normal range of motion. Neck supple.  Cardiovascular: Normal rate, regular rhythm and normal heart sounds.  Exam reveals no friction rub.   No murmur heard. Pulmonary/Chest: Effort normal and breath sounds normal. No respiratory distress. Jacqueline [name] has no wheezes. Jacqueline [name] has no rales.  Musculoskeletal: Normal range of motion.  Tenderness and erythema to dorsal aspect of proximal  left hand and wrist; tenderness of the 2nd metacarpal head and at the anatomical snuff box; sensation intact; no proximal tenderness or edema.  Pt is able to flex and extend the fingers, no erythema or tenderness of the flexor tendons. Compartments soft  Neurological: Jacqueline [name] is alert and oriented to person, place, and time.  Skin: Skin is warm and dry.  No lesions or open wounds of the left upper extremity  Psychiatric: Jacqueline [name] has a normal mood and affect. Her behavior is normal.  Nursing note and vitals reviewed.   ED Course  Procedures (including critical care time) DIAGNOSTIC STUDIES: Oxygen Saturation is 99% on room air, normal by my interpretation.    COORDINATION OF CARE: 12:32 PM Discussed treatment plan with patient at beside, the patient agrees with the plan and has no further questions at this time.   Labs Review Labs Reviewed - No data to display  Imaging Review Dg Wrist Complete Left  03/19/2015   CLINICAL DATA:  Two day history of pain and redness. Patient reports history of gout  EXAM: LEFT WRIST - COMPLETE 3+ VIEW  COMPARISON:  None.  FINDINGS: Frontal, oblique, lateral, and ulnar deviation scaphoid images were obtained. There is no fracture or dislocation. A small amount of calcification is noted in the triangular fibrocartilage region. There is extensive osteoarthritic change in the saddle joint. There is mild osteoarthritic change in the scaphotrapezial joint. There is no erosive change or periostitis.  IMPRESSION: Osteoarthritic change, moderately severe common the saddle joint.  Milder osteoarthritic change in the scaphotrapezial joint. A small amount of calcification is seen in the triangular fibrocartilage region. Question chronic tear in this area. No erosive change or bony destruction. No fracture or dislocation.   Electronically Signed   By: Lowella Grip III M.D.   On: 03/19/2015 12:17   Dg Hand Complete Left  03/19/2015   CLINICAL DATA:  Left hand and wrist pain and swelling since Saturday. No known injury. Patient reports history of gout  EXAM: LEFT HAND - COMPLETE 3+ VIEW  COMPARISON:  None currently available  FINDINGS: Soft tissue swelling dorsal to the wrist. There is no underlying fracture, malalignment, or acute erosion.  Diffuse interphalangeal degenerative joint narrowing and spurring, especially to the first through third digits. Mild MCP joint narrowing and spurring, especially at the third digit. There is chondrocalcinosis at the level of the triangular fibrocartilage, with small cystic change in the distal ulna. Advanced first [place] osteoarthritis with joint narrowing and spurring. No soft tissue mineralization or juxta-articular erosions suggestive of gout.  IMPRESSION: 1. Dorsal wrist swelling without acute osseous findings. 2. Multi focal osteoarthritis. 3. Chondrocalcinosis.   Electronically Signed   By: Monte Fantasia M.D.   On: 03/19/2015 12:18     EKG Interpretation None      MDM   Final diagnoses:  Tendonitis of wrist, left    Pt is well appearing.  Vitals stable.  Clinical suspicion is low for septic joint and infectious tenosynovitis.  I have given patient very strict return precautions and referral info for orthopedics  I personally performed the services described in this documentation, which was scribed in my presence. The recorded information has been reviewed and is accurate.'    Patrice Paradise, PA-C 03/21/15 [place], MD 03/22/15 [phone]

## 2015-03-19 NOTE — ED Notes (Signed)
Handing left hand swelling.  Rates pain 8/10.

## 2015-03-19 NOTE — Discharge Instructions (Signed)
Tendinitis ° Tendinitis is redness, soreness, and puffiness (inflammation) of the tendons. Tendons are band-like tissues that connect muscle to bone. Tendinitis often happens in the shoulders, heels, or elbows. It might happen if your job involves doing the same motions over and over. °HOME CARE °· Use a sling or splint as told by your doctor. °· Put ice on the injured area. °¨ Put ice in a plastic bag. °¨ Place a towel between your skin and the bag. °¨ Leave the ice on for 15-20 minutes, 03-04 times a day. °· Avoid using your injured arm or leg until the pain goes away. °· Do gentle exercises only as told by your doctor. Stop exercises if the pain gets worse, unless your doctor tells you otherwise. °· Only take medicines as told by your doctor. °GET HELP RIGHT AWAY IF: °· Your pain and puffiness get worse. °· You have new problems, such as loss of feeling (numbness) in the hands. °MAKE SURE YOU: °· Understand these instructions. °· Will watch your condition. °· Will get help right away if you are not doing well or get worse. °Document Released: 03/20/2011 Document Revised: 03/01/2012 Document Reviewed: 03/20/2011 °[employer]® Patient Information ©2015 [employer]. This information is not intended to replace advice given to you by your health care provider. Make sure you discuss any questions you have with your health care provider. ° °

## 2015-03-19 NOTE — ED Notes (Signed)
Lt hand red and swollen , onset Saturday, says she was helping someone clean their house, but no known injury.  Hx of gout in rt hand but never in left.  Pt had to cut rings off finger due to swelling. Pt says she had not taken colchicine for a couple of days, but began taking again on Saturday.

## 2015-04-14 ENCOUNTER — Emergency Department (HOSPITAL_COMMUNITY): Payer: Medicaid Other

## 2015-04-14 ENCOUNTER — Emergency Department (HOSPITAL_COMMUNITY)
Admission: EM | Admit: 2015-04-14 | Discharge: 2015-04-14 | Disposition: A | Discharge status: Home / Self Care | Payer: Medicaid Other | Attending: Emergency Medicine | Admitting: Emergency Medicine

## 2015-04-14 ENCOUNTER — Encounter (HOSPITAL_COMMUNITY): Payer: Self-pay | Admitting: Emergency Medicine

## 2015-04-14 DIAGNOSIS — K219 Gastro-esophageal reflux disease without esophagitis: Secondary | ICD-10-CM | POA: Diagnosis not present

## 2015-04-14 DIAGNOSIS — Z86018 Personal history of other benign neoplasm: Secondary | ICD-10-CM | POA: Insufficient documentation

## 2015-04-14 DIAGNOSIS — Y9289 Other specified places as the place of occurrence of the external cause: Secondary | ICD-10-CM | POA: Insufficient documentation

## 2015-04-14 DIAGNOSIS — G43909 Migraine, unspecified, not intractable, without status migrainosus: Secondary | ICD-10-CM | POA: Diagnosis not present

## 2015-04-14 DIAGNOSIS — S99911A Unspecified injury of right ankle, initial encounter: Secondary | ICD-10-CM | POA: Diagnosis present

## 2015-04-14 DIAGNOSIS — G629 Polyneuropathy, unspecified: Secondary | ICD-10-CM | POA: Insufficient documentation

## 2015-04-14 DIAGNOSIS — M109 Gout, unspecified: Secondary | ICD-10-CM | POA: Insufficient documentation

## 2015-04-14 DIAGNOSIS — F419 Anxiety disorder, unspecified: Secondary | ICD-10-CM | POA: Diagnosis not present

## 2015-04-14 DIAGNOSIS — Y9301 Activity, walking, marching and hiking: Secondary | ICD-10-CM | POA: Diagnosis not present

## 2015-04-14 DIAGNOSIS — S9031XA Contusion of right foot, initial encounter: Secondary | ICD-10-CM

## 2015-04-14 DIAGNOSIS — G8929 Other chronic pain: Secondary | ICD-10-CM | POA: Diagnosis not present

## 2015-04-14 DIAGNOSIS — Z79899 Other long term (current) drug therapy: Secondary | ICD-10-CM | POA: Insufficient documentation

## 2015-04-14 DIAGNOSIS — S93401A Sprain of unspecified ligament of right ankle, initial encounter: Secondary | ICD-10-CM | POA: Diagnosis not present

## 2015-04-14 DIAGNOSIS — Y998 Other external cause status: Secondary | ICD-10-CM | POA: Insufficient documentation

## 2015-04-14 DIAGNOSIS — Z720 Tobacco use: Secondary | ICD-10-CM | POA: Diagnosis not present

## 2015-04-14 DIAGNOSIS — X58XXXA Exposure to other specified factors, initial encounter: Secondary | ICD-10-CM | POA: Insufficient documentation

## 2015-04-14 DIAGNOSIS — Z7952 Long term (current) use of systemic steroids: Secondary | ICD-10-CM | POA: Diagnosis not present

## 2015-04-14 MED ORDER — TRAMADOL HCL 50 MG PO TABS: 50.0000 mg | ORAL_TABLET | Freq: Four times a day (QID) | ORAL | Status: DC | PRN

## 2015-04-14 NOTE — ED Notes (Signed)
Pt verbalized understanding of no driving and to use caution within 4 hours of taking pain meds due to meds cause drowsiness 

## 2015-04-14 NOTE — ED Notes (Signed)
Pt reports right foot pain x1 week. Pt reports was walking and "tunred" right foot. Pt reports increase in pain with walking.

## 2015-04-14 NOTE — ED Provider Notes (Signed)
CSN: [id number]     Arrival date & time 04/14/15  1514 History   First MD Initiated Contact with Patient 04/14/15 1522     Chief Complaint  Patient presents with  . Ankle Pain     (Consider location/radiation/quality/duration/timing/severity/associated sxs/prior Treatment) The history is provided by the patient.   Jacqueline [name] is a 64 y.o. female presenting with right heel and ankle pain which occurred suddenly when the patient twisted her right foot when walking 1 week ago.  Pain is aching, constant and worse with palpation, movement and weight bearing although has been bearing weight all week on this injury.  The patient was able to weight bear immediately after the event.  There is no radiation of pain and the patient denies numbness distal to the injury site.  The patients treatment prior to arrival included prednisone left over from a previous prescription which did not improve her pain. She does endorse history of gout, but current pain is different from her classic gout pain.     Past Medical History  Diagnosis Date  . Gastric nodule 2009    EUS, ?leiomyoma  . Irritable bowel syndrome   . History of pancreatitis     Biliary and/or etoh?  Marland Kitchen Anxiety   . Chronic back pain   . GERD (gastroesophageal reflux disease)   . Migraines   . 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 Peripheral neuropathy   . Knee pain   . Gout   . Migraines    Past Surgical History  Procedure Laterality Date  . Back surgery /ray cage fusion comberg, gso    . Toe graft    . Cesarean section    . 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  . Tonsillectomy    . Abdominal hysterectomy    . Dilation and curettage of uterus    . Gallbladder surgery  2010   . Eus  12/2008    Dr. Paulita Fujita. Gastric nodule most consistent with Leiomyoma. Slightly thickened and irregular gallbladder wall, possibly due to sludge or diminutive stones. Recommend EUS in January 2011 to followup gastric nodule.  . Esophagogastroduodenoscopy  11/2008    A 9-mm submucosal lesion seen in the cardia., gastritis, no hpylori  . Colonoscopy  2001    Dr. Laural Golden, hemorrhoids  . Cholecystectomy    . Colonoscopy  10/21/2011    OVZ:CHYIFOY polyp multiple/internal hemorrhoids  . Savory dilation  02/03/2012    DXA:JOINOMVEH in the distal esophagus/Mild gastritis  . Back surgery    . Abdominal surgery    . Foot surgery     Family History  Problem Relation Age of Onset  . Colon cancer Mother     diagnosed in late 35s and died age 78  . Heart failure Mother   . Heart defect      family history   . Arthritis      family history  . COPD      family history   . Cancer      multiple unknown type  . Heart attack Father     deceased at 25  . Hypertension Father   . Heart failure Father   . Lung cancer Maternal Uncle   . Throat  cancer Maternal Uncle   . Colon cancer Paternal Uncle   . Colon cancer Maternal Aunt   . Colon cancer Other   . Anesthesia problems Neg Hx   . Hypotension Neg Hx   . Malignant hyperthermia Neg Hx   . Pseudochol deficiency Neg Hx    History  Substance Use Topics  . Smoking status: Current Every Day Smoker -- 0.50 packs/day for 35 years    Types: Cigarettes  . Smokeless tobacco: Never Used  . Alcohol Use: Yes     Comment: occasional holiday drink   OB History    Gravida Para Term Preterm AB TAB SAB Ectopic Multiple Living   3 2 2  1  1   2      Review of Systems  Musculoskeletal: Positive for joint swelling and arthralgias.  Skin: Negative for wound.  Neurological: Negative for weakness and numbness.      Allergies  Aspirin; Imitrex; Ketorolac tromethamine; Nsaids; Promethazine hcl; and Sulfonamide derivatives  Home Medications    Prior to Admission medications   Medication Sig Start Date End Date Taking? Authorizing Provider  gabapentin (NEURONTIN) 300 MG capsule Take 600 mg by mouth 3 (three) times daily.   Yes Historical Provider, MD  predniSONE (DELTASONE) 20 MG tablet Take 2 tablets (40 mg total) by mouth daily with breakfast. For 5 days 03/19/15  Yes Tammy Triplett, PA-C  colchicine 0.6 MG tablet Take 1 tablet (0.6 mg total) by mouth 2 (two) times daily. 10/05/14   Evalee Jefferson, PA-C  dexamethasone (DECADRON) 6 MG tablet 1 po bid with food 01/26/15   Lily Kocher, PA-C  HYDROcodone-acetaminophen (NORCO/VICODIN) 5-325 MG per tablet Take one tab po q 4-6 hrs prn pain 03/19/15   Tammy Triplett, PA-C  ibuprofen (ADVIL,MOTRIN) 200 MG tablet Take 400-800 mg by mouth every 8 (eight) hours as needed for moderate pain.    Historical Provider, MD  methocarbamol (ROBAXIN) 750 MG tablet Take 750 mg by mouth 4 (four) times daily.    Historical Provider, MD  omeprazole (PRILOSEC) 40 MG capsule Take 40 mg by mouth 2 (two) times daily before a meal.    Historical Provider, MD  traMADol (ULTRAM) 50 MG tablet Take 1 tablet (50 mg total) by mouth every 6 (six) hours as needed. 04/14/15   Evalee Jefferson, PA-C   BP 111/70 mmHg  Pulse 75  Temp(Src) 98.9 F (37.2 C) (Oral)  Resp 18  Ht 5\' 2"  (1.575 m)  Wt 112 lb (50.803 kg)  BMI 20.48 kg/m2  SpO2 99% Physical Exam  Constitutional: She appears well-developed and well-nourished.  HENT:  Head: Normocephalic.  Cardiovascular: Normal rate and intact distal pulses.  Exam reveals no decreased pulses.   Pulses:      Dorsalis pedis pulses are 2+ on the right side, and 2+ on the left side.       Posterior tibial pulses are 2+ on the right side, and 2+ on the left side.  Musculoskeletal: She exhibits edema and tenderness.       Right ankle: She exhibits decreased range of motion and swelling. She exhibits no ecchymosis, no deformity and normal pulse. Tenderness. Lateral malleolus tenderness  found. No head of 5th metatarsal and no proximal fibula tenderness found. Achilles tendon normal. Achilles tendon exhibits no pain and no defect.  ttp inferior to the right lateral malleolus along with mild edema.  Also ttp at base of calcaneous. No deformity appreciated.  Achille intact, nontender.    Neurological: She is alert. No sensory  deficit.  Skin: Skin is warm, dry and intact.  Nursing note and vitals reviewed.   ED Course  Procedures (including critical care time) Labs Review Labs Reviewed - No data to display  Imaging Review Dg Ankle Complete Right  04/14/2015   CLINICAL DATA:  Right foot pain for 1 week. Increased pain with walking.  EXAM: RIGHT ANKLE - COMPLETE 3+ VIEW  COMPARISON:  None.  FINDINGS: There is no evidence of fracture, dislocation, or joint effusion. Small marginal tibial osteophytes. Soft tissues are unremarkable.  IMPRESSION: No acute osseous injury of the right ankle.   Electronically Signed   By: Kathreen Devoid   On: 04/14/2015 16:30   Dg Os Calcis Right  04/14/2015   CLINICAL DATA:  RIGHT FOOT PAIN FOR 1 WEEK. TWISTING INJURY OF THE RIGHT FOOT. INITIAL ENCOUNTER.  EXAM: RIGHT OS CALCIS - 2+ VIEW  COMPARISON:  None.  FINDINGS: There is no evidence of fracture or other focal bone lesions. Soft tissues are unremarkable.  IMPRESSION: NEGATIVE OS CALCIS VIEWS.   Electronically Signed   By: Dereck Ligas M.D.   On: 04/14/2015 16:29     EKG Interpretation None      MDM   Final diagnoses:  Ankle sprain, right, initial encounter  Contusion of heel, right, initial encounter    Patients labs and/or radiological studies were reviewed and considered during the medical decision making and disposition process.  Results were also discussed with patient. Pt placed in aso, prescribed ultram for pain.  Confirmed she has remaining decadron 6 mg tabs at home from prescription 2/16 - she will finish this prescription.  Heat tx.  F/u with her pcp for a recheck in one week if  not improving.    Evalee Jefferson, PA-C 04/14/15 Virgina Jock, MD 04/24/15 [phone]

## 2015-04-14 NOTE — Discharge Instructions (Signed)
Ankle Sprain °An ankle sprain is an injury to the strong, fibrous tissues (ligaments) that hold the bones of your ankle joint together.  °CAUSES °An ankle sprain is usually caused by a fall or by twisting your ankle. Ankle sprains most commonly occur when you step on the outer edge of your foot, and your ankle turns inward. People who participate in sports are more prone to these types of injuries.  °SYMPTOMS  °· Pain in your ankle. The pain may be present at rest or only when you are trying to stand or walk. °· Swelling. °· Bruising. Bruising may develop immediately or within 1 to 2 days after your injury. °· Difficulty standing or walking, particularly when turning corners or changing directions. °DIAGNOSIS  °Your caregiver will ask you details about your injury and perform a physical exam of your ankle to determine if you have an ankle sprain. During the physical exam, your caregiver will press on and apply pressure to specific areas of your foot and ankle. Your caregiver will try to move your ankle in certain ways. An X-ray exam may be done to be sure a bone was not broken or a ligament did not separate from one of the bones in your ankle (avulsion fracture).  °TREATMENT  °Certain types of braces can help stabilize your ankle. Your caregiver can make a recommendation for this. Your caregiver may recommend the use of medicine for pain. If your sprain is severe, your caregiver may refer you to a surgeon who helps to restore function to parts of your skeletal system (orthopedist) or a physical therapist. °HOME CARE INSTRUCTIONS  °· Apply ice to your injury for 1-2 days or as directed by your caregiver. Applying ice helps to reduce inflammation and pain. °¨ Put ice in a plastic bag. °¨ Place a towel between your skin and the bag. °¨ Leave the ice on for 15-20 minutes at a time, every 2 hours while you are awake. °· Only take over-the-counter or prescription medicines for pain, discomfort, or fever as directed by  your caregiver. °· Elevate your injured ankle above the level of your heart as much as possible for 2-3 days. °· If your caregiver recommends crutches, use them as instructed. Gradually put weight on the affected ankle. Continue to use crutches or a cane until you can walk without feeling pain in your ankle. °· If you have a plaster splint, wear the splint as directed by your caregiver. Do not rest it on anything harder than a pillow for the first 24 hours. Do not put weight on it. Do not get it wet. You may take it off to take a shower or bath. °· You may have been given an elastic bandage to wear around your ankle to provide support. If the elastic bandage is too tight (you have numbness or tingling in your foot or your foot becomes cold and blue), adjust the bandage to make it comfortable. °· If you have an air splint, you may blow more air into it or let air out to make it more comfortable. You may take your splint off at night and before taking a shower or bath. Wiggle your toes in the splint several times per day to decrease swelling. °SEEK MEDICAL CARE IF:  °· You have rapidly increasing bruising or swelling. °· Your toes feel extremely cold or you lose feeling in your foot. °· Your pain is not relieved with medicine. °SEEK IMMEDIATE MEDICAL CARE IF: °· Your toes are numb or blue. °·   You have severe pain that is increasing. MAKE SURE YOU:   Understand these instructions.  Will watch your condition.  Will get help right away if you are not doing well or get worse. Document Released: 12/08/2005 Document Revised: 09/01/2012 Document Reviewed: 12/20/2011 [facility] Patient Information 2015 [place], [place]. This information is not intended to replace advice given to you by your health care provider. Make sure you discuss any questions you have with your health care provider.   Wear the ASO to protect your ankle joint.  Use ice and elevation as much as possible for the next several days to help reduce the  swelling.  Take the medications prescribed.  You may take the tramadol prescribed for pain relief.  This will make you drowsy - do not drive within 4 hours of taking this medication.  Call your doctor for a recheck of your injury in 1 week if not improving.  You may benefit from physical therapy of your ankle if it is not getting better over the next week.  Your xrays are negative today.

## 2015-04-23 ENCOUNTER — Emergency Department (HOSPITAL_COMMUNITY)
Admission: EM | Admit: 2015-04-23 | Discharge: 2015-04-24 | Disposition: A | Discharge status: Other Acute Inpatient Hospital | Payer: MEDICAID | Attending: Emergency Medicine | Admitting: Emergency Medicine

## 2015-04-23 ENCOUNTER — Encounter (HOSPITAL_COMMUNITY): Payer: Self-pay

## 2015-04-23 DIAGNOSIS — F1410 Cocaine abuse, uncomplicated: Secondary | ICD-10-CM | POA: Insufficient documentation

## 2015-04-23 DIAGNOSIS — Z8739 Personal history of other diseases of the musculoskeletal system and connective tissue: Secondary | ICD-10-CM | POA: Insufficient documentation

## 2015-04-23 DIAGNOSIS — Z79899 Other long term (current) drug therapy: Secondary | ICD-10-CM | POA: Diagnosis not present

## 2015-04-23 DIAGNOSIS — G8929 Other chronic pain: Secondary | ICD-10-CM | POA: Insufficient documentation

## 2015-04-23 DIAGNOSIS — Z720 Tobacco use: Secondary | ICD-10-CM | POA: Insufficient documentation

## 2015-04-23 DIAGNOSIS — Z86018 Personal history of other benign neoplasm: Secondary | ICD-10-CM | POA: Insufficient documentation

## 2015-04-23 DIAGNOSIS — G43909 Migraine, unspecified, not intractable, without status migrainosus: Secondary | ICD-10-CM | POA: Diagnosis not present

## 2015-04-23 DIAGNOSIS — F419 Anxiety disorder, unspecified: Secondary | ICD-10-CM | POA: Insufficient documentation

## 2015-04-23 DIAGNOSIS — F1910 Other psychoactive substance abuse, uncomplicated: Secondary | ICD-10-CM

## 2015-04-23 DIAGNOSIS — K219 Gastro-esophageal reflux disease without esophagitis: Secondary | ICD-10-CM | POA: Insufficient documentation

## 2015-04-23 DIAGNOSIS — Z046 Encounter for general psychiatric examination, requested by authority: Secondary | ICD-10-CM | POA: Diagnosis present

## 2015-04-23 DIAGNOSIS — Z7952 Long term (current) use of systemic steroids: Secondary | ICD-10-CM | POA: Insufficient documentation

## 2015-04-23 HISTORY — DX: Other psychoactive substance abuse, uncomplicated: F19.10

## 2015-04-23 HISTORY — DX: Malingerer (conscious simulation): Z76.5

## 2015-04-23 LAB — CBC WITH DIFFERENTIAL/PLATELET
BASOS PCT: 0 % (ref 0–1)
Basophils Absolute: 0 10*3/uL (ref 0.0–0.1)
EOS PCT: 3 % (ref 0–5)
Eosinophils Absolute: 0.2 10*3/uL (ref 0.0–0.7)
HEMATOCRIT: 35.9 % — AB (ref 36.0–46.0)
HEMOGLOBIN: 11.6 g/dL — AB (ref 12.0–15.0)
Lymphocytes Relative: 35 % (ref 12–46)
Lymphs Abs: 2.8 10*3/uL (ref 0.7–4.0)
MCH: 30.4 pg (ref 26.0–34.0)
MCHC: 32.3 g/dL (ref 30.0–36.0)
MCV: 94 fL (ref 78.0–100.0)
MONO ABS: 0.6 10*3/uL (ref 0.1–1.0)
Monocytes Relative: 7 % (ref 3–12)
Neutro Abs: 4.4 10*3/uL (ref 1.7–7.7)
Neutrophils Relative %: 55 % (ref 43–77)
PLATELETS: 259 10*3/uL (ref 150–400)
RBC: 3.82 MIL/uL — AB (ref 3.87–5.11)
RDW: 14.5 % (ref 11.5–15.5)
WBC: 8 10*3/uL (ref 4.0–10.5)

## 2015-04-23 LAB — COMPREHENSIVE METABOLIC PANEL
ALBUMIN: 3.8 g/dL (ref 3.5–5.0)
ALT: 7 U/L — ABNORMAL LOW (ref 14–54)
AST: 13 U/L — AB (ref 15–41)
Alkaline Phosphatase: 65 U/L (ref 38–126)
Anion gap: 7 (ref 5–15)
BUN: 12 mg/dL (ref 6–20)
CO2: 27 mmol/L (ref 22–32)
CREATININE: 0.61 mg/dL (ref 0.44–1.00)
Calcium: 8.4 mg/dL — ABNORMAL LOW (ref 8.9–10.3)
Chloride: 104 mmol/L (ref 101–111)
GFR calc Af Amer: >60 mL/min (ref >60)
GFR calc non Af Amer: >60 mL/min (ref >60)
Glucose, Bld: 90 mg/dL (ref 70–99)
POTASSIUM: 3.4 mmol/L — AB (ref 3.5–5.1)
SODIUM: 138 mmol/L (ref 135–145)
Total Bilirubin: 0.4 mg/dL (ref 0.3–1.2)
Total Protein: 6.8 g/dL (ref 6.5–8.1)

## 2015-04-23 LAB — RAPID URINE DRUG SCREEN, HOSP PERFORMED
AMPHETAMINES: NOT DETECTED
Barbiturates: NOT DETECTED
Benzodiazepines: NOT DETECTED
Cocaine: POSITIVE — AB
Opiates: NOT DETECTED
Tetrahydrocannabinol: NOT DETECTED

## 2015-04-23 LAB — ETHANOL

## 2015-04-23 MED ORDER — ONDANSETRON HCL 4 MG PO TABS: 4.0000 mg | ORAL_TABLET | Freq: Three times a day (TID) | ORAL | Status: DC | PRN

## 2015-04-23 MED ORDER — ONDANSETRON 8 MG PO TBDP
8.0000 mg | ORAL_TABLET | Freq: Once | ORAL | Status: AC
  Administered 2015-04-23: 8 mg via ORAL
  Filled 2015-04-23: qty 1

## 2015-04-23 MED ORDER — NICOTINE 21 MG/24HR TD PT24: 21.0000 mg | MEDICATED_PATCH | Freq: Every day | TRANSDERMAL | Status: DC | PRN

## 2015-04-23 MED ORDER — ACETAMINOPHEN 325 MG PO TABS: 650.0000 mg | ORAL_TABLET | ORAL | Status: DC | PRN

## 2015-04-23 MED ORDER — DICYCLOMINE HCL 10 MG PO CAPS
20.0000 mg | ORAL_CAPSULE | Freq: Once | ORAL | Status: AC
Start: 2015-04-23 — End: 2015-04-23
  Administered 2015-04-23: 20 mg via ORAL
  Filled 2015-04-23: qty 2

## 2015-04-23 NOTE — ED Notes (Signed)
Per IVC papers patient made a statement that she wanted to kill herself. At this time in triage patient denies SI/HI

## 2015-04-23 NOTE — ED Provider Notes (Signed)
CSN: [id number]     Arrival date & time 04/23/15  2012 History   First MD Initiated Contact with Patient 04/23/15 2024     Chief Complaint  Patient presents with  . V70.1      HPI Pt was seen at 2030.  Per Police, pt and IVC paperwork: c/o gradual onset and persistence of constant polysubstance abuse "sine 1988." Pt states she has been abusing opiates, cocaine, and marijuana. States she has been using opiates more frequently and "drug seeking wherever I can get them." States she "just can't live like this anymore." Pt voiced SI to her family, who took out IVC paperwork. Pt denies SI to Police and Triage RN. LD opiates this morning. States she feels mildly nauseated and "cramping" now. Denies SA, no HI, no hallucinations. Denies CP/SOB, no abd pain, no vomiting/diarrhea.    Past Medical History  Diagnosis Date  . Gastric nodule 2009    EUS, ?leiomyoma  . Irritable bowel syndrome   . History of pancreatitis     Biliary and/or etoh?  Marland Kitchen Anxiety   . Chronic back pain   . GERD (gastroesophageal reflux disease)   . Migraines   . 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 Peripheral neuropathy   . Knee pain   . Gout   . Migraines   . Drug-seeking behavior   . Polysubstance abuse     opiates, cocaine, marijuana   Past Surgical History  Procedure Laterality Date  . Back surgery /ray cage fusion comberg, gso    . Toe graft    . Cesarean section    . 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  . Tonsillectomy    . Abdominal hysterectomy    . Dilation and curettage of uterus    . Gallbladder surgery  2010  . Eus  12/2008    Dr. Paulita Fujita. Gastric nodule most consistent with Leiomyoma. Slightly thickened and irregular gallbladder wall, possibly due  to sludge or diminutive stones. Recommend EUS in January 2011 to followup gastric nodule.  . Esophagogastroduodenoscopy  11/2008    A 9-mm submucosal lesion seen in the cardia., gastritis, no hpylori  . Colonoscopy  2001    Dr. Laural Golden, hemorrhoids  . Cholecystectomy    . Colonoscopy  10/21/2011    HDQ:QIWLNLG polyp multiple/internal hemorrhoids  . Savory dilation  02/03/2012    XQJ:JHERDEYCX in the distal esophagus/Mild gastritis  . Back surgery    . Abdominal surgery    . Foot surgery     Family History  Problem Relation Age of Onset  . Colon cancer Mother     diagnosed in late 15s and died age 72  . Heart failure Mother   . Heart defect      family history   . Arthritis      family history  . COPD      family history   . Cancer      multiple unknown type  . Heart attack Father     deceased at 86  . Hypertension Father   . Heart failure Father   . Lung cancer Maternal Uncle   . Throat cancer Maternal Uncle   . Colon cancer Paternal Uncle   . Colon cancer  Maternal Aunt   . Colon cancer Other   . Anesthesia problems Neg Hx   . Hypotension Neg Hx   . Malignant hyperthermia Neg Hx   . Pseudochol deficiency Neg Hx    History  Substance Use Topics  . Smoking status: Current Every Day Smoker -- 0.50 packs/day for 35 years    Types: Cigarettes  . Smokeless tobacco: Never Used  . Alcohol Use: Yes     Comment: occasional holiday drink   OB History    Gravida Para Term Preterm AB TAB SAB Ectopic Multiple Living   3 2 2  1  1   2      Review of Systems ROS: Statement: All systems negative except as marked or noted in the HPI; Constitutional: Negative for fever and chills. ; ; Eyes: Negative for eye pain, redness and discharge. ; ; ENMT: Negative for ear pain, hoarseness, nasal congestion, sinus pressure and sore throat. ; ; Cardiovascular: Negative for chest pain, palpitations, diaphoresis, dyspnea and peripheral edema. ; ; Respiratory: Negative for cough, wheezing and  stridor. ; ; Gastrointestinal: +nausea, cramping. Negative for vomiting, diarrhea, abdominal pain, blood in stool, hematemesis, jaundice and rectal bleeding. . ; ; Genitourinary: Negative for dysuria, flank pain and hematuria. ; ; Musculoskeletal: Negative for back pain and neck pain. Negative for swelling and trauma.; ; Skin: Negative for pruritus, rash, abrasions, blisters, bruising and skin lesion.; ; Neuro: Negative for headache, lightheadedness and neck stiffness. Negative for weakness, altered level of consciousness , altered mental status, extremity weakness, paresthesias, involuntary movement, seizure and syncope.; Psych:  +vague SI. No SA, no HI, no hallucinations.     Allergies  Aspirin; Imitrex; Ketorolac tromethamine; Nsaids; Promethazine hcl; and Sulfonamide derivatives  Home Medications   Prior to Admission medications   Medication Sig Start Date End Date Taking? Authorizing Provider  colchicine 0.6 MG tablet Take 1 tablet (0.6 mg total) by mouth 2 (two) times daily. 10/05/14   Evalee Jefferson, PA-C  dexamethasone (DECADRON) 6 MG tablet 1 po bid with food 01/26/15   Lily Kocher, PA-C  gabapentin (NEURONTIN) 300 MG capsule Take 600 mg by mouth 3 (three) times daily.    Historical Provider, MD  HYDROcodone-acetaminophen (NORCO/VICODIN) 5-325 MG per tablet Take one tab po q 4-6 hrs prn pain 03/19/15   Tammy Triplett, PA-C  ibuprofen (ADVIL,MOTRIN) 200 MG tablet Take 400-800 mg by mouth every 8 (eight) hours as needed for moderate pain.    Historical Provider, MD  methocarbamol (ROBAXIN) 750 MG tablet Take 750 mg by mouth 4 (four) times daily.    Historical Provider, MD  omeprazole (PRILOSEC) 40 MG capsule Take 40 mg by mouth 2 (two) times daily before a meal.    Historical Provider, MD  predniSONE (DELTASONE) 20 MG tablet Take 2 tablets (40 mg total) by mouth daily with breakfast. For 5 days 03/19/15   Tammy Triplett, PA-C  traMADol (ULTRAM) 50 MG tablet Take 1 tablet (50 mg total) by mouth  every 6 (six) hours as needed. 04/14/15   Evalee Jefferson, PA-C   BP 161/107 mmHg  Pulse 61  Temp(Src) 98.2 F (36.8 C) (Oral)  Resp 20  Ht 5\' 2"  (1.575 m)  Wt 115 lb (52.164 kg)  BMI 21.03 kg/m2  SpO2 100% Physical Exam  2035; Physical examination:  Nursing notes reviewed; Vital signs and O2 SAT reviewed;  Constitutional: Well developed, Well nourished, Well hydrated, In no acute distress; Head:  Normocephalic, atraumatic; Eyes: EOMI, PERRL, No scleral icterus; ENMT: Mouth and  pharynx normal, Mucous membranes moist; Neck: Supple, Full range of motion, No lymphadenopathy; Cardiovascular: Regular rate and rhythm, No gallop; Respiratory: Breath sounds clear & equal bilaterally, No wheezes.  Speaking full sentences with ease, Normal respiratory effort/excursion; Chest: Nontender, Movement normal; Abdomen: Soft, Nontender, Nondistended, Normal bowel sounds;; Extremities: Pulses normal, No tenderness, No edema, No calf edema or asymmetry.; Neuro: AA&Ox3, Major CN grossly intact.  Speech clear. No gross focal motor or sensory deficits in extremities. Climbs on and off stretcher easily by herself. Gait steady.; Skin: Color normal, Warm, Dry.; Psych:  Affect flat, poor eye contact.    ED Course  Procedures     MDM  MDM Reviewed: previous chart, nursing note and vitals     2115:  TTS eval pending. Holding orders written.       Francine Graven, DO 04/23/15 2119

## 2015-04-23 NOTE — ED Notes (Signed)
Patient states she has been abusing pain medication since 1988. Patient states recently she has been abusing them more frequently and drug seeking "wherever I can get them" per patient. Patients daughter took out IVC papers on patient to help her "get off drugs" patient states "i want to stop using drugs" patient also states she used cocaine, marijuana, and other pain medication this week. Patient states "ive been going down hill"

## 2015-04-23 NOTE — ED Notes (Signed)
Patient given Saltines and Ginger ale at this time, patient states that she is very nauseated at this time. RN made ware.
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
## 2015-04-23 NOTE — ED Notes (Signed)
Patient wanded by security at this time.  °

## 2015-04-24 ENCOUNTER — Inpatient Hospital Stay (HOSPITAL_COMMUNITY)
Admission: EM | Admit: 2015-04-24 | Discharge: 2015-04-30 | DRG: 897 | Disposition: A | Discharge status: Home / Self Care | Payer: MEDICAID | Source: Intra-hospital | Attending: Psychiatry | Admitting: Psychiatry

## 2015-04-24 ENCOUNTER — Encounter (HOSPITAL_COMMUNITY): Payer: Self-pay

## 2015-04-24 DIAGNOSIS — F1124 Opioid dependence with opioid-induced mood disorder: Principal | ICD-10-CM | POA: Diagnosis present

## 2015-04-24 DIAGNOSIS — F332 Major depressive disorder, recurrent severe without psychotic features: Secondary | ICD-10-CM

## 2015-04-24 DIAGNOSIS — F4310 Post-traumatic stress disorder, unspecified: Secondary | ICD-10-CM | POA: Diagnosis present

## 2015-04-24 DIAGNOSIS — G8929 Other chronic pain: Secondary | ICD-10-CM | POA: Diagnosis present

## 2015-04-24 DIAGNOSIS — F1994 Other psychoactive substance use, unspecified with psychoactive substance-induced mood disorder: Secondary | ICD-10-CM | POA: Diagnosis not present

## 2015-04-24 DIAGNOSIS — F322 Major depressive disorder, single episode, severe without psychotic features: Secondary | ICD-10-CM | POA: Diagnosis present

## 2015-04-24 DIAGNOSIS — F17210 Nicotine dependence, cigarettes, uncomplicated: Secondary | ICD-10-CM | POA: Diagnosis present

## 2015-04-24 DIAGNOSIS — F329 Major depressive disorder, single episode, unspecified: Secondary | ICD-10-CM | POA: Diagnosis present

## 2015-04-24 DIAGNOSIS — F1120 Opioid dependence, uncomplicated: Secondary | ICD-10-CM | POA: Diagnosis not present

## 2015-04-24 DIAGNOSIS — F1123 Opioid dependence with withdrawal: Secondary | ICD-10-CM | POA: Diagnosis not present

## 2015-04-24 LAB — TSH: TSH: 0.64 u[IU]/mL (ref 0.350–4.500)

## 2015-04-24 MED ORDER — GABAPENTIN 300 MG PO CAPS
600.0000 mg | ORAL_CAPSULE | Freq: Three times a day (TID) | ORAL | Status: DC
  Administered 2015-04-24 – 2015-04-30 (×19): 600 mg via ORAL
  Filled 2015-04-24 (×17): qty 2
  Filled 2015-04-24 (×2): qty 18
  Filled 2015-04-24 (×8): qty 2
  Filled 2015-04-24: qty 18

## 2015-04-24 MED ORDER — HYDROXYZINE HCL 25 MG PO TABS
25.0000 mg | ORAL_TABLET | Freq: Four times a day (QID) | ORAL | Status: AC | PRN
  Administered 2015-04-24 – 2015-04-28 (×5): 25 mg via ORAL
  Filled 2015-04-24 (×5): qty 1

## 2015-04-24 MED ORDER — CLONIDINE HCL 0.1 MG PO TABS
0.1000 mg | ORAL_TABLET | Freq: Four times a day (QID) | ORAL | Status: AC
  Administered 2015-04-24 – 2015-04-25 (×5): 0.1 mg via ORAL
  Filled 2015-04-24 (×8): qty 1

## 2015-04-24 MED ORDER — DICYCLOMINE HCL 20 MG PO TABS
20.0000 mg | ORAL_TABLET | Freq: Four times a day (QID) | ORAL | Status: AC | PRN
  Administered 2015-04-28: 20 mg via ORAL
  Filled 2015-04-24: qty 1

## 2015-04-24 MED ORDER — NITROGLYCERIN 0.4 MG SL SUBL
0.4000 mg | SUBLINGUAL_TABLET | SUBLINGUAL | Status: DC | PRN
  Administered 2015-04-24: 0.4 mg via SUBLINGUAL

## 2015-04-24 MED ORDER — METHOCARBAMOL 500 MG PO TABS
500.0000 mg | ORAL_TABLET | Freq: Three times a day (TID) | ORAL | Status: DC | PRN
  Administered 2015-04-24 – 2015-04-26 (×4): 500 mg via ORAL
  Filled 2015-04-24 (×4): qty 1

## 2015-04-24 MED ORDER — CLONIDINE HCL 0.1 MG PO TABS
0.1000 mg | ORAL_TABLET | ORAL | Status: AC
  Administered 2015-04-26 (×2): 0.1 mg via ORAL
  Filled 2015-04-24 (×4): qty 1

## 2015-04-24 MED ORDER — TRAZODONE HCL 50 MG PO TABS
50.0000 mg | ORAL_TABLET | Freq: Every evening | ORAL | Status: DC | PRN
  Administered 2015-04-25 – 2015-04-29 (×9): 50 mg via ORAL
  Filled 2015-04-24 (×4): qty 1
  Filled 2015-04-24: qty 6
  Filled 2015-04-24 (×2): qty 1
  Filled 2015-04-24: qty 6
  Filled 2015-04-24 (×12): qty 1

## 2015-04-24 MED ORDER — NITROGLYCERIN 0.4 MG SL SUBL
SUBLINGUAL_TABLET | SUBLINGUAL | Status: AC
  Administered 2015-04-24: 0.4 mg via SUBLINGUAL
  Filled 2015-04-24: qty 1

## 2015-04-24 MED ORDER — ONDANSETRON 4 MG PO TBDP
4.0000 mg | ORAL_TABLET | Freq: Four times a day (QID) | ORAL | Status: AC | PRN
  Administered 2015-04-24 – 2015-04-28 (×4): 4 mg via ORAL
  Filled 2015-04-24 (×4): qty 1

## 2015-04-24 MED ORDER — CLONIDINE HCL 0.1 MG PO TABS
0.1000 mg | ORAL_TABLET | Freq: Every day | ORAL | Status: AC
  Administered 2015-04-29: 0.1 mg via ORAL
  Filled 2015-04-24 (×2): qty 1

## 2015-04-24 MED ORDER — LORAZEPAM 0.5 MG PO TABS
0.5000 mg | ORAL_TABLET | Freq: Four times a day (QID) | ORAL | Status: DC | PRN
  Administered 2015-04-24 – 2015-04-30 (×16): 0.5 mg via ORAL
  Filled 2015-04-24 (×16): qty 1

## 2015-04-24 MED ORDER — LOPERAMIDE HCL 2 MG PO CAPS: 2.0000 mg | ORAL_CAPSULE | ORAL | Status: AC | PRN

## 2015-04-24 MED ORDER — POTASSIUM CHLORIDE CRYS ER 20 MEQ PO TBCR
20.0000 meq | EXTENDED_RELEASE_TABLET | Freq: Two times a day (BID) | ORAL | Status: AC
  Administered 2015-04-24 – 2015-04-25 (×4): 20 meq via ORAL
  Filled 2015-04-24 (×5): qty 1

## 2015-04-24 MED ORDER — ALUM & MAG HYDROXIDE-SIMETH 200-200-20 MG/5ML PO SUSP
30.0000 mL | ORAL | Status: DC | PRN
  Administered 2015-04-24: 30 mL via ORAL
  Filled 2015-04-24: qty 30

## 2015-04-24 MED ORDER — PANTOPRAZOLE SODIUM 40 MG PO TBEC
40.0000 mg | DELAYED_RELEASE_TABLET | Freq: Every day | ORAL | Status: DC
  Administered 2015-04-24 – 2015-04-30 (×7): 40 mg via ORAL
  Filled 2015-04-24 (×9): qty 1

## 2015-04-24 MED ORDER — ACETAMINOPHEN 325 MG PO TABS
650.0000 mg | ORAL_TABLET | Freq: Four times a day (QID) | ORAL | Status: DC | PRN
  Administered 2015-04-24 – 2015-04-29 (×3): 650 mg via ORAL
  Filled 2015-04-24 (×3): qty 2

## 2015-04-24 MED ORDER — NICOTINE 21 MG/24HR TD PT24
21.0000 mg | MEDICATED_PATCH | Freq: Every day | TRANSDERMAL | Status: DC
Start: 2015-04-24 — End: 2015-04-30
  Administered 2015-04-24 – 2015-04-30 (×7): 21 mg via TRANSDERMAL
  Filled 2015-04-24 (×5): qty 1
  Filled 2015-04-24: qty 14
  Filled 2015-04-24 (×3): qty 1

## 2015-04-24 MED ORDER — MAGNESIUM HYDROXIDE 400 MG/5ML PO SUSP
30.0000 mL | Freq: Every day | ORAL | Status: DC | PRN
  Administered 2015-04-28: 30 mL via ORAL
  Filled 2015-04-24: qty 30

## 2015-04-24 NOTE — [facility] Suicide Risk Assessment (Signed)
[facility] Admission Suicide Risk Assessment   Nursing information obtained from:  Patient Demographic factors:  Divorced or widowed, Low socioeconomic status, Unemployed Current Mental Status:  Suicidal ideation indicated by patient Loss Factors:  Loss of significant relationship Historical Factors:    Risk Reduction Factors:  Sense of responsibility to family, Living with another person, especially a relative, Positive social support Total Time spent with patient: 45 minutes Principal Problem: Opioid type dependence, continuous Diagnosis:   Patient Active Problem List   Diagnosis Date Noted  . Substance induced mood disorder [F19.94] 04/24/2015  . Opioid type dependence, continuous [F11.20] 04/24/2015  . Severe major depression without psychotic features [F32.2] 04/24/2015  . PTSD (post-traumatic stress disorder) [F43.10] 04/24/2015  . Chest pain, localized [R07.89] 04/28/2012  . Odynophagia [R13.10] 09/30/2011  . Colon cancer screening [Z12.11] 09/30/2011  . Gastric tumor [D37.1] 09/30/2011  . CLOSED FRACTURE OF UPPER END OF FIBULA [S82.839A] 07/31/2010  . DYSPHAGIA [R13.19] 07/17/2009  . ABDOMINAL PAIN, GENERALIZED [R10.84] 07/17/2009  . GASTRITIS [K29.70, K29.90] 07/12/2009  . IBS [K58.9] 07/12/2009  . RUQ PAIN [R10.11] 07/12/2009  . EPIGASTRIC PAIN [R10.13] 07/12/2009  . PANCREATITIS, ACUTE, HX OF [Z87.19] 07/12/2009  . Osteoarthrosis, unspecified whether generalized or localized, hand [M19.049] 11/09/2008  . OSTEOARTHRITIS, LOWER LEG [M17.10] 10/25/2008  . DERANGEMENT MENISCUS [M23.302] 10/16/2008  . KNEE PAIN [M25.569] 10/16/2008     Continued Clinical Symptoms:  Alcohol Use Disorder Identification Test Final Score (AUDIT): 0 The "Alcohol Use Disorders Identification Test", Guidelines for Use in Primary Care, Second Edition.  World Pharmacologist [facility]). Score between 0-7:  no or low risk or alcohol related problems. Score between 8-15:  moderate risk of alcohol related  problems. Score between 16-19:  high risk of alcohol related problems. Score 20 or above:  warrants further diagnostic evaluation for alcohol dependence and treatment.   CLINICAL FACTORS:   Depression:   Comorbid alcohol abuse/dependence Insomnia Severe Alcohol/Substance Abuse/Dependencies   Psychiatric Specialty Exam: Physical Exam  ROS  Blood pressure 94/64, pulse 75, temperature 98.2 F (36.8 C), temperature source Oral, resp. rate 16, height 5' 1.25" (1.556 m), weight 51.256 kg (113 lb).Body mass index is 21.17 kg/(m^2).   COGNITIVE FEATURES THAT CONTRIBUTE TO RISK:  Closed-mindedness, Polarized thinking and Thought constriction (tunnel vision)    SUICIDE RISK:   Moderate:  PLAN OF CARE: Supportive approach/coping skills                               Opioid dependence: clonidine detox protocol                               Depression; reassess for the use of an antidepressant                               PTSD; help further address the sequela of trauma                               CBT/mindfulness                               Explore residential treatment options  Medical Decision Making:  Review of Psycho-Social Stressors (1), Review of Medication Regimen & Side Effects (2) and Review of  New Medication or Change in Dosage (2)  I certify that inpatient services furnished can reasonably be expected to improve the patient's condition.   Merrill A 04/24/2015, 6:49 PM

## 2015-04-24 NOTE — Progress Notes (Signed)
Patient complained of chest pain.  Patient stated pain was in the middle of chest and her left shoulder was hurting.  Vitals taken BP 138/69 R16 T98.2 P61.  Patient stated that when she sat up she felt better.  Patient was given nitro tab. Vitals repeated after administration BP 98/63 R16 P67.  Patient immediately stated the pain had improved.  She was also given maalox and the head of her bead was raised.  Patient was having some sob lying flat in the bed.  Continue to monitor patient closely.

## 2015-04-24 NOTE — Progress Notes (Signed)
Pt did not attend group d/t being dizzy and unsteady on her feet.  Pt remained in her room in bed during group time.   Rick Duff, MHT

## 2015-04-24 NOTE — Plan of Care (Signed)
Problem: Alteration in mood Goal: LTG-Patient reports reduction in suicidal thoughts (Patient reports reduction in suicidal thoughts and is able to verbalize a safety plan for whenever patient is feeling suicidal)  Outcome: Progressing Patient reports decrease in suicidal thoughts.  Patient remains safe.
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
## 2015-04-24 NOTE — Progress Notes (Signed)
D: patient complaining of severe withdrawal symptoms: chilling, cramping, nausea and irritability.  She presents with flat,blunted affect and depressed mood.  She rates her depression, hopelessness and anxiety as an 8.  She states her SI has decreased today.  She was up in the milieu for her med and promptly went back to bed afterwards.  She is calm and cooperative. A: Continue to monitor withdrawal symptoms and medicate as necessary.  Monitor medication management and MD orders.  Safety checks completed every 15 minutes per protocol.  Meet 1:1 with patient to discuss concerns and offer encouragement. R: Patient's behavior is appropriate to situation.
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
## 2015-04-24 NOTE — BHH Group Notes (Signed)
[place] Group Notes:  (Nursing/MHT/Case Management/Adjunct)  Date:  04/24/2015  Time:  0900 am  Type of Therapy:  Psychoeducational Skills  Participation Level:  Did Not Attend   Zipporah Plants 04/24/2015, 9:42 AM

## 2015-04-24 NOTE — BHH Suicide Risk Assessment (Signed)
[facility] INPATIENT:  Family/Significant Other Suicide Prevention Education  Suicide Prevention Education:  Education Completed; Daughter Jacqueline [name] [phone],  (name of family member/significant other) has been identified by the patient as the family member/significant other with whom the patient will be residing, and identified as the person(s) who will aid the patient in the event of a mental health crisis (suicidal ideations/suicide attempt).  With written consent from the patient, the family member/significant other has been provided the following suicide prevention education, prior to the and/or following the discharge of the patient.  The suicide prevention education provided includes the following:  Suicide risk factors  Suicide prevention and interventions  National Suicide Hotline telephone number  [facility] assessment telephone number  [facility] Emergency Assistance [place] and/or Residential Mobile Crisis Unit telephone number  Request made of family/significant other to:  Remove weapons (e.g., guns, rifles, knives), all items previously/currently identified as safety concern.    Remove drugs/medications (over-the-counter, prescriptions, illicit drugs), all items previously/currently identified as a safety concern.  The family member/significant other verbalizes understanding of the suicide prevention education information provided.  The family member/significant other agrees to remove the items of safety concern listed above.  Jacqueline [name], [name] 04/24/2015, 4:45 PM

## 2015-04-24 NOTE — BHH Group Notes (Signed)
[place] LCSW Group Therapy 04/24/2015  1:15 PM   Type of Therapy: Group Therapy  Participation Level: Did Not Attend. Patient invited to participate but declined.    [name], MSW, [place] Worker [facility] [phone]

## 2015-04-24 NOTE — Tx Team (Signed)
Initial Interdisciplinary Treatment Plan   PATIENT STRESSORS: Financial difficulties Loss of boyfriend - about 1 year ago Substance abuse   PATIENT STRENGTHS: General fund of knowledge Motivation for treatment/growth Supportive family/friends   PROBLEM LIST: Problem List/Patient Goals Date to be addressed Date deferred Reason deferred Estimated date of resolution  " Stop taking Vicodin" 5/3   D/C  SI  pt reports having "crazy thoughts" in her attempt of ending her opiate use.  5/3     Depression 5/3     Anxiety 5/3                                    DISCHARGE CRITERIA:  Improved stabilization in mood, thinking, and/or behavior Motivation to continue treatment in a less acute level of care Need for constant or close observation no longer present Reduction of life-threatening or endangering symptoms to within safe limits Verbal commitment to aftercare and medication compliance Withdrawal symptoms are absent or subacute and managed without 24-hour nursing intervention  PRELIMINARY DISCHARGE PLAN: Attend 12-step recovery group Outpatient therapy  PATIENT/FAMIILY INVOLVEMENT: This treatment plan has been presented to and reviewed with the patient, MARLA POULIOT.  The patient and family have been given the opportunity to ask questions and make suggestions.  Jatia Musa A 04/24/2015, 5:40 AM

## 2015-04-24 NOTE — Progress Notes (Signed)
D: Pt is a 64 yr female IVC for SI and opiate detox. Pt reports that she has been taking opiates for about 30 years. She reports being unsuccessful in stopping on her own.  She reports having "crazy thoughts" . She admits that these "crazy thoughts" are thoughts of SI. She reports a failed attempt of receiving help at a pain clinic due to not having her I.D. Pt endorses abusing pain pills due to gaining a tolerance over the years. Pt was placed on opiates due to migraines (1988) and then again for a back surgery in 1990. Pt reports having depression, anxiety, panic attacks, hopelessness, and insomnia.  Pt was cooperative on admission.  Stressors: Opiate abuse r/t pain, Finances, and her BF who died 1 yr ago Skin assessment: superficial scratches on r. middle finger and thumb. Acne scar on back. Surgical scars on abdomen. Scar tissue on upper back.

## 2015-04-24 NOTE — Progress Notes (Signed)
Patient ID: JALESA THIEN, female   DOB: 1951/08/26, 64 y.o.   MRN: [id number] D: "I'm withdrawing I need something". Patient demand medication but reports vague symptoms. Pt seemed unsure about what she wanted when asked to describe symptoms. Pt mood and affect appeared depressed and flat. Evening B/P 89/62 with recheck pt B/P increase to 98/68. Cooperative with assessment.   A: Met with pt 1:1. Medications administered as prescribed. Writer explained the reason for not given clonidine. Pt encourage to continue to drink fluids and rise up slowly when getting to sitting/lying position.   R: Patient remains safe. She is complaint with medications. Pt denies SI/HI/AVH.

## 2015-04-24 NOTE — Tx Team (Signed)
Interdisciplinary Treatment Plan Update (Adult) Date: 04/24/2015   Time Reviewed: 9:30 AM  Progress in Treatment: Attending groups: CSW continuing to assess, patient new to milieu. Participating in groups: CSW continuing to assess, patient new to milieu. Taking medication as prescribed: Yes Tolerating medication: Yes Family/Significant other contact made: CSW continuing to assess, patient new to milieu. Patient understands diagnosis: Yes Discussing patient identified problems/goals with staff: Yes Medical problems stabilized or resolved: Yes Denies suicidal/homicidal ideation: Yes Issues/concerns per patient self-inventory: Yes Other:  New problem(s) identified: N/A  Discharge Plan or Barriers:  5/3: CSW continuing to assess, patient new to milieu.  Reason for Continuation of Hospitalization:  Depression Anxiety Medication Stabilization   Comments: N/A  Estimated length of stay: 3-5 days  For review of initial/current patient goals, please see plan of care. Patient is a 64 year old female admitted for polysubstance abuse, depression, and passive SI. Patient lives in [place] with her daughter. Patient will benefit from crisis stabilization, medication evaluation, group therapy, and psycho education in addition to case management for discharge planning. Patient and CSW reviewed pt's identified goals and treatment plan. Pt verbalized understanding and agreed to treatment plan.   Attendees: Patient:    Family:    Physician: Dr. Parke Poisson; Dr. Sabra Heck 04/24/2015 9:30 AM  Nursing: Markham Jordan, Charlyne Quale, RN 04/24/2015 9:30 AM  Clinical Social Worker: [name], [place] 04/24/2015 9:30 AM  Other: Joette Catching, [place], LCSW(A) 04/24/2015 9:30 AM  Other: Lucinda Dell, [place] Sessions Liaison 04/24/2015 9:30 AM  Other: Lars Pinks, Case Manager 04/24/2015 9:30 AM  Other: Ave Filter NP 04/24/2015 9:30 AM  Other:    Other:          Scribe for Treatment Team:  [name], MSW, [employer] [phone]

## 2015-04-25 NOTE — Plan of Care (Signed)
Problem: Diagnosis: Increased Risk For Suicide Attempt Goal: STG-Patient Will Attend All Groups On The Unit Outcome: Completed/Met Date Met:  04/25/15 Pt in room most  and did not attend evening wrap up group.

## 2015-04-25 NOTE — Clinical Social Work Note (Signed)
Referral sent to [facility] for review for wait list.  [name], MSW, [place] Worker [facility] [phone]

## 2015-04-25 NOTE — Progress Notes (Signed)
Recreation Therapy Notes  Animal-Assisted Activity (AAA) Program Checklist/Progress Notes Patient Eligibility Criteria Checklist & Daily Group note for Rec Tx Intervention  Date: [id number] Time: 2:45pm Location: [address]    AAA/T Program Assumption of Risk Form signed by Patient/ or Parent Legal Guardian yes  Patient is free of allergies or sever asthma yes  Patient reports no fear of animals yes  Patient reports no history of cruelty to animals yes  Patient understands his/her participation is voluntary yes  Behavioral Response: Did not attend.   Laureen Ochs Santana Edell, LRT/CTRS  Lane Hacker 04/25/2015 8:38 AM

## 2015-04-25 NOTE — Progress Notes (Signed)
D Pt. Denies SI and HI,  complaints of cramps requested robaxin. Pt.'s   A Writer offered support and encouragement, discussed coping skills with pt.  R Pt. Remains safe on the unit.  Reports she is using deep breathing as a coping skill, received robaxin to ease her cramps.

## 2015-04-25 NOTE — Clinical Social Work Note (Signed)
Referrals sent to [facility] (2 week wait list), [facility], and [facility].  [name], MSW, [place] Worker [facility] [phone]

## 2015-04-25 NOTE — Progress Notes (Signed)
Recreation Therapy Notes  Date: 05.04.2016 Time: 9:30am Location: [address]   Group Topic: Stress Management  Goal Area(s) Addresses:  Patient will actively participate in stress management techniques presented during session.   Behavioral Response: Did not attend.   Laureen Ochs Joseth Weigel, LRT/CTRS  Lane Hacker 04/25/2015 12:47 PM

## 2015-04-25 NOTE — BHH Group Notes (Signed)
[place] LCSW Group Therapy 04/25/2015  1:15 PM   Type of Therapy: Group Therapy  Participation Level: Did Not Attend. Patient invited to participate but declined due to not feeling well.   [name], MSW, [place] Worker [facility] [phone]

## 2015-04-25 NOTE — BHH Group Notes (Signed)
   [facility] LCSW Aftercare Discharge Planning Group Note  04/25/2015  8:45 AM   Participation Quality: Alert, Appropriate and Oriented  Mood/Affect: Depressed and Flat  Depression Rating: 5-6  Anxiety Rating: 8  Thoughts of Suicide: Pt denies SI/HI  Will you contract for safety? Yes  Current AVH: Pt denies  Plan for Discharge/Comments: Pt attended discharge planning group and actively participated in group. CSW provided pt with today's workbook. Patient reports feeling "shaky" today and continuing to experience withdrawal symptoms. Patient is interested in residential treatment, CSW continuing to assess for options.   Transportation Means: Pt reports access to transportation  Supports: No supports mentioned at this time  [name], MSW, [place] Social Worker [employer] [phone]

## 2015-04-25 NOTE — Progress Notes (Signed)
[facility] MD Progress Note  04/25/2015 6:38 PM Jacqueline [name]  MRN:  [id number] Subjective:  Jacqueline [name] endorses she is having a rough time. She continues to withdraw from the opioids. As she withdraws she is having more pain and she is getting more depressed. States that she does not know how is she going to make it. She has been on this pills for so long. She is feeling lightheaded. Some clonidine dosages had to be held due to low BP. She is requesting to be able to go to a residential treatment program after detox Principal Problem: Opioid type dependence, continuous Diagnosis:   Patient Active Problem List   Diagnosis Date Noted  . Substance induced mood disorder [F19.94] 04/24/2015  . Opioid type dependence, continuous [F11.20] 04/24/2015  . Severe major depression without psychotic features [F32.2] 04/24/2015  . PTSD (post-traumatic stress disorder) [F43.10] 04/24/2015  . Chest pain, localized [R07.89] 04/28/2012  . Odynophagia [R13.10] 09/30/2011  . Colon cancer screening [Z12.11] 09/30/2011  . Gastric tumor [D37.1] 09/30/2011  . CLOSED FRACTURE OF UPPER END OF FIBULA [S82.839A] 07/31/2010  . DYSPHAGIA [R13.19] 07/17/2009  . ABDOMINAL PAIN, GENERALIZED [R10.84] 07/17/2009  . GASTRITIS [K29.70, K29.90] 07/12/2009  . IBS [K58.9] 07/12/2009  . RUQ PAIN [R10.11] 07/12/2009  . EPIGASTRIC PAIN [R10.13] 07/12/2009  . PANCREATITIS, ACUTE, HX OF [Z87.19] 07/12/2009  . Osteoarthrosis, unspecified whether generalized or localized, hand [M19.049] 11/09/2008  . OSTEOARTHRITIS, LOWER LEG [M17.10] 10/25/2008  . DERANGEMENT MENISCUS [M23.302] 10/16/2008  . KNEE PAIN [M25.569] 10/16/2008   Total Time spent with patient: 30 minutes   Past Medical History:  Past Medical History  Diagnosis Date  . Gastric nodule 2009    EUS, ?leiomyoma  . Irritable bowel syndrome   . History of pancreatitis     Biliary and/or etoh?  Marland Kitchen Anxiety   . Chronic back pain   . GERD (gastroesophageal reflux disease)   .  Migraines   . 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 Peripheral neuropathy   . Knee pain   . Gout   . Migraines   . Drug-seeking behavior   . Polysubstance abuse     opiates, cocaine, marijuana    Past Surgical History  Procedure Laterality Date  . Back surgery /ray cage fusion comberg, gso    . Toe graft    . Cesarean section    . 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  . Tonsillectomy    . Abdominal hysterectomy    . Dilation and curettage of uterus    . Gallbladder surgery  2010  . Eus  12/2008    Dr. Paulita Fujita. Gastric nodule most consistent with Leiomyoma. Slightly thickened and irregular gallbladder wall, possibly due to sludge or diminutive stones. Recommend EUS in January 2011 to followup gastric nodule.  . Esophagogastroduodenoscopy  11/2008    A 9-mm submucosal lesion seen in the cardia., gastritis, no hpylori  . Colonoscopy  2001    Dr. Laural Golden, hemorrhoids  . Cholecystectomy    . Colonoscopy  10/21/2011    MCN:[id number] polyp multiple/internal hemorrhoids  . Savory dilation  02/03/2012    ZMO:[id number] in the distal esophagus/Mild gastritis  . Back surgery    . Abdominal surgery    . Foot  surgery     Family History:  Family History  Problem Relation Age of Onset  . Colon cancer Mother     diagnosed in late 75s and died age 22  . Heart failure Mother   . Heart defect      family history   . Arthritis      family history  . COPD      family history   . Cancer      multiple unknown type  . Heart attack Father     deceased at 25  . Hypertension Father   . Heart failure Father   . Lung cancer Maternal Uncle   . Throat cancer Maternal Uncle   . Colon cancer Paternal Uncle   . Colon cancer Maternal Aunt   .  Colon cancer Other   . Anesthesia problems Neg Hx   . Hypotension Neg Hx   . Malignant hyperthermia Neg Hx   . Pseudochol deficiency Neg Hx    Social History:  History  Alcohol Use  . Yes    Comment: occasional holiday drink... pt denied alcohol use on admission to [facility]     History  Drug Use  . Yes  . Special: Marijuana, Cocaine    Comment: pt takes a "puff" of marijuana if she hasn't had anything for nausea-last use 2 months ago, denies any use today 06/18/2014    History   Social History  . Marital Status: Divorced    Spouse Name: N/A  . Number of Children: 2  . Years of Education: 10th grade   Occupational History  . disabled: back problems     Social History Main Topics  . Smoking status: Current Every Day Smoker -- 0.50 packs/day for 35 years    Types: Cigarettes  . Smokeless tobacco: Never Used  . Alcohol Use: Yes     Comment: occasional holiday drink... pt denied alcohol use on admission to [facility]  . Drug Use: Yes    Special: Marijuana, Cocaine     Comment: pt takes a "puff" of marijuana if she hasn't had anything for nausea-last use 2 months ago, denies any use today 06/18/2014  . Sexual Activity: Not on file   Other Topics Concern  . None   Social History Narrative   Additional History:    Sleep: Poor  Appetite:  Poor   Assessment:   Musculoskeletal: Strength & Muscle Tone: within normal limits Gait & Station: normal Patient leans: N/A   Psychiatric Specialty Exam: Physical Exam  Review of Systems  Constitutional: Positive for weight loss and malaise/fatigue.  Eyes: Negative.   Respiratory: Negative.   Cardiovascular: Positive for chest pain.  Gastrointestinal: Positive for heartburn and nausea.  Genitourinary: Negative.   Musculoskeletal: Positive for myalgias.  Skin: Negative.   Neurological: Positive for dizziness, weakness and headaches.  Endo/Heme/Allergies: Negative.   Psychiatric/Behavioral: Positive for depression and substance  abuse. The patient is nervous/anxious and has insomnia.     Blood pressure 115/69, pulse 80, temperature 98.1 F (36.7 C), temperature source Oral, resp. rate 16, height 5' 1.25" (1.556 m), weight 51.256 kg (113 lb).Body mass index is 21.17 kg/(m^2).  General Appearance: Disheveled  Eye Sport and exercise psychologist::  Fair  Speech:  Clear and Coherent and Slow  Volume:  Decreased  Mood:  Anxious and Depressed  Affect:  Depressed and Tearful  Thought Process:  Coherent and Goal Directed  Orientation:  Full (Time, Place, and Person)  Thought Content:  symptoms events worries concerns, a sense of hopelessness  Suicidal Thoughts:  No  Homicidal Thoughts:  No  Memory:  Immediate;   Fair Recent;   Fair Remote;   Fair  Judgement:  Fair  Insight:  Present and Shallow  Psychomotor Activity:  Restlessness  Concentration:  Fair  Recall:  [employer] of Knowledge:Fair  Language: Fair  Akathisia:  No  Handed:  Right  AIMS (if indicated):     Assets:  Desire for Improvement  ADL's:  Intact  Cognition: WNL  Sleep:        Current Medications: Current Facility-Administered Medications  Medication Dose Route Frequency Provider Last Rate Last Dose  . acetaminophen (TYLENOL) tablet 650 mg  650 mg Oral Q6H PRN Laverle Hobby, PA-C   650 mg at 04/24/15 0342  . alum & mag hydroxide-simeth (MAALOX/MYLANTA) 200-200-20 MG/5ML suspension 30 mL  30 mL Oral Q4H PRN Laverle Hobby, PA-C   30 mL at 04/24/15 1557  . cloNIDine (CATAPRES) tablet 0.1 mg  0.1 mg Oral QID Laverle Hobby, PA-C   0.1 mg at 04/25/15 1629   Followed by  . [START ON 04/26/2015] cloNIDine (CATAPRES) tablet 0.1 mg  0.1 mg Oral BH-qamhs Spencer E Simon, PA-C       Followed by  . [START ON 04/28/2015] cloNIDine (CATAPRES) tablet 0.1 mg  0.1 mg Oral QAC breakfast Laverle Hobby, PA-C      . dicyclomine (BENTYL) tablet 20 mg  20 mg Oral Q6H PRN Laverle Hobby, PA-C      . gabapentin (NEURONTIN) capsule 600 mg  600 mg Oral TID Laverle Hobby, PA-C   600  mg at 04/25/15 1629  . hydrOXYzine (ATARAX/VISTARIL) tablet 25 mg  25 mg Oral Q6H PRN Laverle Hobby, PA-C   25 mg at 04/25/15 1413  . loperamide (IMODIUM) capsule 2-4 mg  2-4 mg Oral PRN Laverle Hobby, PA-C      . LORazepam (ATIVAN) tablet 0.5 mg  0.5 mg Oral Q6H PRN Nicholaus Bloom, MD   0.5 mg at 04/25/15 1629  . magnesium hydroxide (MILK OF MAGNESIA) suspension 30 mL  30 mL Oral Daily PRN Laverle Hobby, PA-C      . methocarbamol (ROBAXIN) tablet 500 mg  500 mg Oral Q8H PRN Laverle Hobby, PA-C   500 mg at 04/25/15 1000  . nicotine (NICODERM CQ - dosed in mg/24 hours) patch 21 mg  21 mg Transdermal Daily Nicholaus Bloom, MD   21 mg at 04/25/15 0809  . nitroGLYCERIN (NITROSTAT) SL tablet 0.4 mg  0.4 mg Sublingual Q5 min PRN Encarnacion Slates, NP   0.4 mg at 04/24/15 1558  . ondansetron (ZOFRAN-ODT) disintegrating tablet 4 mg  4 mg Oral Q6H PRN Laverle Hobby, PA-C   4 mg at 04/24/15 1959  . pantoprazole (PROTONIX) EC tablet 40 mg  40 mg Oral Daily Laverle Hobby, PA-C   40 mg at 04/25/15 2694  . traZODone (DESYREL) tablet 50 mg  50 mg Oral QHS,MR X 1 Laverle Hobby, PA-C   50 mg at 04/24/15 2200    Lab Results:  Results for orders placed or performed during the hospital encounter of 04/24/15 (from the past 48 hour(s))  TSH     Status: None   Collection Time: 04/24/15  7:35 PM  Result Value Ref Range   TSH 0.640 0.350 - 4.500 uIU/mL    Comment: Performed at [facility]    Physical Findings: AIMS:  , ,  ,  ,  CIWA:    COWS:  COWS Total Score: 5  Treatment Plan Summary: Daily contact with patient to assess and evaluate symptoms and progress in treatment and Medication management Supportive approach/coping skills Opioid dependence' continue clonidine detox protocol Will reassure her that past the third-fourth day the withdrawal should get better Depression; will start Cymbalta what can help the pain and the depression Pain; will  optimize the response to the  Neurontin, and will add Cymbalta Will explore residential treatment options  Medical Decision Making:  Review of Psycho-Social Stressors (1), Review or order clinical lab tests (1), Review of Medication Regimen & Side Effects (2) and Review of New Medication or Change in Dosage (2)     Jaydeen Darley A 04/25/2015, 6:38 PM

## 2015-04-26 LAB — GABAPENTIN LEVEL: GABAPENTIN LVL: 10.6 ug/mL

## 2015-04-26 MED ORDER — METHOCARBAMOL 750 MG PO TABS
750.0000 mg | ORAL_TABLET | Freq: Four times a day (QID) | ORAL | Status: DC
  Administered 2015-04-26 – 2015-04-30 (×17): 750 mg via ORAL
  Filled 2015-04-26 (×29): qty 1

## 2015-04-26 MED ORDER — FLUOXETINE HCL 20 MG PO CAPS
20.0000 mg | ORAL_CAPSULE | Freq: Every day | ORAL | Status: DC
  Administered 2015-04-26 – 2015-04-30 (×5): 20 mg via ORAL
  Filled 2015-04-26 (×2): qty 1
  Filled 2015-04-26: qty 3
  Filled 2015-04-26 (×6): qty 1

## 2015-04-26 MED ORDER — TUBERCULIN PPD 5 UNIT/0.1ML ID SOLN
5.0000 [IU] | Freq: Once | INTRADERMAL | Status: AC
  Administered 2015-04-26: 5 [IU] via INTRADERMAL

## 2015-04-26 MED ORDER — TRAMADOL HCL 50 MG PO TABS
50.0000 mg | ORAL_TABLET | Freq: Four times a day (QID) | ORAL | Status: DC | PRN
  Administered 2015-04-26 – 2015-04-30 (×12): 50 mg via ORAL
  Filled 2015-04-26 (×12): qty 1

## 2015-04-26 NOTE — Progress Notes (Signed)
Performed TB Skin test on 04/26/15 at 1020 hrs.

## 2015-04-26 NOTE — Progress Notes (Signed)
[facility] MD Progress Note  04/26/2015 4:38 PM Jacqueline [name]  MRN:  [id number] Subjective:  Timmya continues to have a hard time. She endorses S/S of withdrawal yet has not been able to get the clonidine as her BP has been low. She endorses worsening of her back pain. She used Ultram before and states that when she was not using other opioids heavily it did help her. She had it prescribed by her PCP. She also endorses depression. She remembered she was tried on Cymbalta in the past but she experienced suicidal ideations. They went away when she she quit it. She remembers having a better response to Prozac Principal Problem: Opioid type dependence, continuous Diagnosis:   Patient Active Problem List   Diagnosis Date Noted  . Substance induced mood disorder [F19.94] 04/24/2015  . Opioid type dependence, continuous [F11.20] 04/24/2015  . Severe major depression without psychotic features [F32.2] 04/24/2015  . PTSD (post-traumatic stress disorder) [F43.10] 04/24/2015  . Chest pain, localized [R07.89] 04/28/2012  . Odynophagia [R13.10] 09/30/2011  . Colon cancer screening [Z12.11] 09/30/2011  . Gastric tumor [D37.1] 09/30/2011  . CLOSED FRACTURE OF UPPER END OF FIBULA [S82.839A] 07/31/2010  . DYSPHAGIA [R13.19] 07/17/2009  . ABDOMINAL PAIN, GENERALIZED [R10.84] 07/17/2009  . GASTRITIS [K29.70, K29.90] 07/12/2009  . IBS [K58.9] 07/12/2009  . RUQ PAIN [R10.11] 07/12/2009  . EPIGASTRIC PAIN [R10.13] 07/12/2009  . PANCREATITIS, ACUTE, HX OF [Z87.19] 07/12/2009  . Osteoarthrosis, unspecified whether generalized or localized, hand [M19.049] 11/09/2008  . OSTEOARTHRITIS, LOWER LEG [M17.10] 10/25/2008  . DERANGEMENT MENISCUS [M23.302] 10/16/2008  . KNEE PAIN [M25.569] 10/16/2008   Total Time spent with patient: 30 minutes   Past Medical History:  Past Medical History  Diagnosis Date  . Gastric nodule 2009    EUS, ?leiomyoma  . Irritable bowel syndrome   . History of pancreatitis     Biliary  and/or etoh?  Marland Kitchen Anxiety   . Chronic back pain   . GERD (gastroesophageal reflux disease)   . Migraines   . 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 Peripheral neuropathy   . Knee pain   . Gout   . Migraines   . Drug-seeking behavior   . Polysubstance abuse     opiates, cocaine, marijuana    Past Surgical History  Procedure Laterality Date  . Back surgery /ray cage fusion comberg, gso    . Toe graft    . Cesarean section    . 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  . Tonsillectomy    . Abdominal hysterectomy    . Dilation and curettage of uterus    . Gallbladder surgery  2010  . Eus  12/2008    Dr. Paulita Fujita. Gastric nodule most consistent with Leiomyoma. Slightly thickened and irregular gallbladder wall, possibly due to sludge or diminutive stones. Recommend EUS in January 2011 to followup gastric nodule.  . Esophagogastroduodenoscopy  11/2008    A 9-mm submucosal lesion seen in the cardia., gastritis, no hpylori  . Colonoscopy  2001    Dr. Laural Golden, hemorrhoids  . Cholecystectomy    . Colonoscopy  10/21/2011    WYS:HUOHFGB polyp multiple/internal hemorrhoids  . Savory dilation  02/03/2012    MSX:[id number] in the distal esophagus/Mild gastritis  .  Back surgery    . Abdominal surgery    . Foot surgery     Family History:  Family History  Problem Relation Age of Onset  . Colon cancer Mother     diagnosed in late 11s and died age 21  . Heart failure Mother   . Heart defect      family history   . Arthritis      family history  . COPD      family history   . Cancer      multiple unknown type  . Heart attack Father     deceased at 67  . Hypertension Father   . Heart failure Father   . Lung cancer Maternal Uncle   . Throat  cancer Maternal Uncle   . Colon cancer Paternal Uncle   . Colon cancer Maternal Aunt   . Colon cancer Other   . Anesthesia problems Neg Hx   . Hypotension Neg Hx   . Malignant hyperthermia Neg Hx   . Pseudochol deficiency Neg Hx    Social History:  History  Alcohol Use  . Yes    Comment: occasional holiday drink... pt denied alcohol use on admission to [facility]     History  Drug Use  . Yes  . Special: Marijuana, Cocaine    Comment: pt takes a "puff" of marijuana if she hasn't had anything for nausea-last use 2 months ago, denies any use today 06/18/2014    History   Social History  . Marital Status: Divorced    Spouse Name: N/A  . Number of Children: 2  . Years of Education: 10th grade   Occupational History  . disabled: back problems     Social History Main Topics  . Smoking status: Current Every Day Smoker -- 0.50 packs/day for 35 years    Types: Cigarettes  . Smokeless tobacco: Never Used  . Alcohol Use: Yes     Comment: occasional holiday drink... pt denied alcohol use on admission to [facility]  . Drug Use: Yes    Special: Marijuana, Cocaine     Comment: pt takes a "puff" of marijuana if she hasn't had anything for nausea-last use 2 months ago, denies any use today 06/18/2014  . Sexual Activity: Not on file   Other Topics Concern  . None   Social History Narrative   Additional History:    Sleep: Poor  Appetite:  Poor   Assessment:   Musculoskeletal: Strength & Muscle Tone: within normal limits Gait & Station: normal Patient leans: N/A   Psychiatric Specialty Exam: Physical Exam  Review of Systems  Constitutional: Positive for malaise/fatigue.  Eyes: Negative.   Respiratory: Negative.   Cardiovascular: Negative.   Gastrointestinal: Negative.   Genitourinary: Negative.   Musculoskeletal: Positive for myalgias, back pain, joint pain and neck pain.  Skin: Negative.   Neurological: Positive for weakness and headaches.  Endo/Heme/Allergies: Negative.    Psychiatric/Behavioral: Positive for depression and substance abuse. The patient is nervous/anxious.     Blood pressure 99/72, pulse 87, temperature 98.5 F (36.9 C), temperature source Oral, resp. rate 16, height 5' 1.25" (1.556 m), weight 51.256 kg (113 lb).Body mass index is 21.17 kg/(m^2).  General Appearance: Fairly Groomed  Engineer, water::  Fair  Speech:  Clear and Coherent  Volume:  Decreased  Mood:  Anxious and Depressed  Affect:  Depressed, Tearful and anxious worried  Thought Process:  Coherent and Goal Directed  Orientation:  Full (Time, Place, and Person)  Thought Content:  symptoms events worries concerns  Suicidal Thoughts:  No  Homicidal Thoughts:  No  Memory:  Immediate;   Fair Recent;   Fair Remote;   Fair  Judgement:  Fair  Insight:  Present and Shallow  Psychomotor Activity:  Restlessness  Concentration:  Fair  Recall:  [employer] of Knowledge:Fair  Language: Fair  Akathisia:  No  Handed:  Right  AIMS (if indicated):     Assets:  Desire for Improvement  ADL's:  Intact  Cognition: WNL  Sleep:  Number of Hours: 6.5     Current Medications: Current Facility-Administered Medications  Medication Dose Route Frequency Provider Last Rate Last Dose  . acetaminophen (TYLENOL) tablet 650 mg  650 mg Oral Q6H PRN Laverle Hobby, PA-C   650 mg at 04/26/15 4481  . alum & mag hydroxide-simeth (MAALOX/MYLANTA) 200-200-20 MG/5ML suspension 30 mL  30 mL Oral Q4H PRN Laverle Hobby, PA-C   30 mL at 04/24/15 1557  . cloNIDine (CATAPRES) tablet 0.1 mg  0.1 mg Oral BH-qamhs Spencer E Simon, PA-C   0.1 mg at 04/26/15 8563   Followed by  . [START ON 04/28/2015] cloNIDine (CATAPRES) tablet 0.1 mg  0.1 mg Oral QAC breakfast Laverle Hobby, PA-C      . dicyclomine (BENTYL) tablet 20 mg  20 mg Oral Q6H PRN Laverle Hobby, PA-C      . FLUoxetine (PROZAC) capsule 20 mg  20 mg Oral Daily Nicholaus Bloom, MD   20 mg at 04/26/15 1106  . gabapentin (NEURONTIN) capsule 600 mg  600 mg Oral  TID Laverle Hobby, PA-C   600 mg at 04/26/15 1632  . hydrOXYzine (ATARAX/VISTARIL) tablet 25 mg  25 mg Oral Q6H PRN Laverle Hobby, PA-C   25 mg at 04/25/15 1413  . loperamide (IMODIUM) capsule 2-4 mg  2-4 mg Oral PRN Laverle Hobby, PA-C      . LORazepam (ATIVAN) tablet 0.5 mg  0.5 mg Oral Q6H PRN Nicholaus Bloom, MD   0.5 mg at 04/26/15 1301  . magnesium hydroxide (MILK OF MAGNESIA) suspension 30 mL  30 mL Oral Daily PRN Laverle Hobby, PA-C      . methocarbamol (ROBAXIN) tablet 750 mg  750 mg Oral QID Nicholaus Bloom, MD   750 mg at 04/26/15 1630  . nicotine (NICODERM CQ - dosed in mg/24 hours) patch 21 mg  21 mg Transdermal Daily Nicholaus Bloom, MD   21 mg at 04/26/15 0739  . nitroGLYCERIN (NITROSTAT) SL tablet 0.4 mg  0.4 mg Sublingual Q5 min PRN Encarnacion Slates, NP   0.4 mg at 04/24/15 1558  . ondansetron (ZOFRAN-ODT) disintegrating tablet 4 mg  4 mg Oral Q6H PRN Laverle Hobby, PA-C   4 mg at 04/24/15 1959  . pantoprazole (PROTONIX) EC tablet 40 mg  40 mg Oral Daily Laverle Hobby, PA-C   40 mg at 04/26/15 0739  . traMADol (ULTRAM) tablet 50 mg  50 mg Oral Q6H PRN Nicholaus Bloom, MD   50 mg at 04/26/15 1113  . traZODone (DESYREL) tablet 50 mg  50 mg Oral QHS,MR X 1 Laverle Hobby, PA-C   50 mg at 04/25/15 2147  . tuberculin injection 5 Units  5 Units Intradermal Once Nicholaus Bloom, MD   5 Units at 04/26/15 1020    Lab Results:  Results for orders placed or performed during the hospital encounter of 04/24/15 (from the past 48 hour(s))  TSH  Status: None   Collection Time: 04/24/15  7:35 PM  Result Value Ref Range   TSH 0.640 0.350 - 4.500 uIU/mL    Comment: Performed at [facility]    Physical Findings: AIMS: Facial and Oral Movements Muscles of Facial Expression: None, normal Lips and Perioral Area: None, normal Jaw: None, normal Tongue: None, normal,Extremity Movements Upper (arms, wrists, hands, fingers): None, normal Lower (legs, knees, ankles, toes):  None, normal, Trunk Movements Neck, shoulders, hips: None, normal, Overall Severity Severity of abnormal movements (highest score from questions above): None, normal Incapacitation due to abnormal movements: None, normal Patient's awareness of abnormal movements (rate only patient's report): No Awareness, Dental Status Current problems with teeth and/or dentures?: No Does patient usually wear dentures?: No  CIWA:  CIWA-Ar Total: 9 COWS:  COWS Total Score: 3  Treatment Plan Summary: Daily contact with patient to assess and evaluate symptoms and progress in treatment and Medication management  Supportive approach/coping skills Opioid withdrawal; will  hold the clonidine due to her low BP but will continue the other medications in the protocol for symptomatic relief Will work a relapse prevention plan Pain; will increase the Robaxin to 750 mg QID ( she states she had a good response to this regime before) will make Ultram 50 mg Q 6 PRN available for pain at least for the next couple of days Depression; will discard the plan of using Cymbalta due to the SI that she reported to the Cymbalta in the past. Will try Prozac 20 mg that she reports was beneficial for her depression   Medical Decision Making:  Review of Psycho-Social Stressors (1), Review or order clinical lab tests (1), Review of Medication Regimen & Side Effects (2) and Review of New Medication or Change in Dosage (2)     Granvel Proudfoot A 04/26/2015, 4:38 PM

## 2015-04-26 NOTE — Progress Notes (Signed)
Patient states that her blood pressure is beginning to drop and she is happy about it. Her positives for today os that her daughter came to see her leaving her in a positive mood. She states that she is now beginning to come out of her room and is excited about her discharge plane. Patients also states that she is have some back discomfort.

## 2015-04-26 NOTE — Progress Notes (Signed)
D: Patient denies SI/HI and A/V hallucinations; patient reports sleep is fair; reports appetite is good; reports energy level is good ; reports ability to concentrate is in between poor and good; rates depression as 4/10; rates hopelessness 4/10; rates anxiety as 6/10  A: Monitored q 15 minutes; patient encouraged to attend groups; patient educated about medications; patient given medications per physician orders; patient encouraged to express feelings and/or concerns  R: Patient is very sad and tearful especially when talking about situations from the past; patient focuses on her pain and blood pressure being low; patient's interaction with staff and peers is minimal ; patient was able to set goal to talk with staff 1:1 when having feelings of SI; patient is taking medications as prescribed and tolerating medications; patient is attending some groups

## 2015-04-26 NOTE — [facility] Group Notes (Signed)
[place] LCSW Group Therapy 04/26/2015  1:15 PM   Type of Therapy: Group Therapy  Participation Level: Did Not Attend. Patient invited to participate but declined.   [name], MSW, [place] Worker [facility] [phone]

## 2015-04-26 NOTE — Clinical Social Work Note (Signed)
CSW spoke with Jacqueline [name] at [facility] who is in the process of reviewing referral. CSW continuing to follow.  [name], MSW, [place] Worker [facility] [phone]

## 2015-04-27 NOTE — Tx Team (Signed)
Interdisciplinary Treatment Plan Update (Adult) Date: 04/27/2015   Time Reviewed: 9:30 AM  Progress in Treatment: Attending groups: Yes Participating in groups: Limited Taking medication as prescribed: Yes Tolerating medication: Yes Family/Significant other contact made: Completed w daughter, Dorita Rowlands Patient understands diagnosis: Yes Discussing patient identified problems/goals with staff: Yes Medical problems stabilized or resolved: Yes Denies suicidal/homicidal ideation: Yes Issues/concerns per patient self-inventory: Yes Other:  New problem(s) identified: N/A  Discharge Plan or Barriers:  5/3: CSW continuing to assess, patient new to milieu. 04/27/15:  Patient wants referral for residential treatment, wants to "succeed" at abstinence, concerned she will fail outpatient. Referrals made to [facility], patient lacks entrance fee, [facility] and [facility].  CSW following up.   Reason for Continuation of Hospitalization:  Depression Anxiety Medication Stabilization   Comments: N/A  Estimated length of stay: 3-5 days  For review of initial/current patient goals, please see plan of care. Patient is a 64 year old female admitted for polysubstance abuse, depression, and passive SI. Patient lives in [place] with her daughter. Patient will benefit from crisis stabilization, medication evaluation, group therapy, and psycho education in addition to case management for discharge planning. Patient and CSW reviewed pt's identified goals and treatment plan. Pt verbalized understanding and agreed to treatment plan.   Attendees: Patient:    Family:    Physician: Dr. Sabra Heck 04/27/2015 9:30 AM  Nursing: Sharl Ma, RN 04/27/2015 9:30 AM  Clinical Social Worker: Eusebio Me, LCSW 04/27/2015 9:30 AM  Other: Joette Catching, LCSW  04/27/2015 9:30 AM  Other:  04/27/2015 9:30 AM  Other: Lars Pinks, Case Manager 04/27/2015 9:30 AM  Other:  04/27/2015 9:30 AM  Other:    Other:          Scribe for Treatment Team:  Edwyna Shell, [place]

## 2015-04-27 NOTE — BHH Group Notes (Signed)
[facility] LCSW Group Therapy  Feelings Around Relapse 1:15 -2:30                                                               04/27/2015 3:55 PM   Type of Therapy:  Group Therapy  Participation Level:  Invited, did not attend.  Low blood pressure and was advised to remain in bed by MD  Edwyna Shell, [place] Worker

## 2015-04-27 NOTE — Progress Notes (Signed)
D: Pt presents with flat affect and depressed mood. Pt reports feeling dizzy during initial assessment. Pt hypotensive this morning and encouraged to lay down and rest this morning. Pt encouraged to increase fluid intake and noted to be compliant. Pt clonidine held this morning and MD made aware. Pt reported generalized weakness this morning. Pt noted to be med seeking and continuously asking for prn meds. Pt reports withdrawal symptoms of chills, cramps, and shakes. Pt rates depression 4/10. Hopeless 4/10. Anxiety 4/10. Pt denies suicidal thoughts.  A: Medications administered as ordered per MD. Verbal support given. Pt encouraged to attend groups as tolerable. 15 minute checks performed for safety. Prn meds given at pt request per MD order. Fluids given to pt throughout the day. R: Pt receptive to treatment. Pt stated goal is to find out about aftercare tx.

## 2015-04-27 NOTE — B[facility] Group Notes (Signed)
[facility] LCSW Aftercare Discharge Planning Group Note   04/27/2015 12:06 PM  Participation Quality:  Limited due to low blood pressure symptoms  Mood/Affect:  Depressed and Flat  Depression Rating:  5  Anxiety Rating:  8  Thoughts of Suicide:  No Will you contract for safety?   NA  Current AVH:  No  Plan for Discharge/Comments:  Wants [facility] or [facility], CSW checking on whether funds for [facility] can be provided by [facility], referrals in process.  Transportation Means: depends on destination  Supports: limited  Beverely Pace

## 2015-04-27 NOTE — Progress Notes (Signed)
Pt attended AA meeting.  

## 2015-04-27 NOTE — Progress Notes (Signed)
Patient ID: Jacqueline [name], female   DOB: [birth year], 64 y.o.   MRN: [id number] D)  Has been in the dayroom at times this evening, stated didn't want to disturb roommate who isn't feeling well.   Has been rather quiet, but interacting appropriately with staff and select peers.  Initial BP tonight was low but wanted clonidine, encouraged fluids, was appreciative for the clonidine after recheck,  Stated had had some back spasms but was feeling better after taking tramadol and robaxin late on prev shift.  A)  Will continue to monitor for safety, support,continue POC R)  Safety maintained.

## 2015-04-27 NOTE — Progress Notes (Signed)
[facility] MD Progress Note  04/27/2015 4:51 PM FRANCELIA MCLAR[facility]  MRN:  [id number] Subjective:  Jacqueline [name] is still endorsing residual withdrawal symptoms. She has not been able to take any more clonidine due to her low BP. She is still worried and feeling overwhelmed not sure of how she is going to deal with her chronic pain. She has her back, her stomach that bother her. When she starts thinking about this she gets very overwhelmed with a sense of hopelessness helplessness. States she tries to change her focus and think about something else Principal Problem: Opioid type dependence, continuous Diagnosis:   Patient Active Problem List   Diagnosis Date Noted  . Substance induced mood disorder [F19.94] 04/24/2015  . Opioid type dependence, continuous [F11.20] 04/24/2015  . Severe major depression without psychotic features [F32.2] 04/24/2015  . PTSD (post-traumatic stress disorder) [F43.10] 04/24/2015  . Chest pain, localized [R07.89] 04/28/2012  . Odynophagia [R13.10] 09/30/2011  . Colon cancer screening [Z12.11] 09/30/2011  . Gastric tumor [D37.1] 09/30/2011  . CLOSED FRACTURE OF UPPER END OF FIBULA [S82.839A] 07/31/2010  . DYSPHAGIA [R13.19] 07/17/2009  . ABDOMINAL PAIN, GENERALIZED [R10.84] 07/17/2009  . GASTRITIS [K29.70, K29.90] 07/12/2009  . IBS [K58.9] 07/12/2009  . RUQ PAIN [R10.11] 07/12/2009  . EPIGASTRIC PAIN [R10.13] 07/12/2009  . PANCREATITIS, ACUTE, HX OF [Z87.19] 07/12/2009  . Osteoarthrosis, unspecified whether generalized or localized, hand [M19.049] 11/09/2008  . OSTEOARTHRITIS, LOWER LEG [M17.10] 10/25/2008  . DERANGEMENT MENISCUS [M23.302] 10/16/2008  . KNEE PAIN [M25.569] 10/16/2008   Total Time spent with patient: 30 minutes   Past Medical History:  Past Medical History  Diagnosis Date  . Gastric nodule 2009    EUS, ?leiomyoma  . Irritable bowel syndrome   . History of pancreatitis     Biliary and/or etoh?  Marland Kitchen Anxiety   . Chronic back pain   . GERD (gastroesophageal  reflux disease)   . Migraines   . 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 Peripheral neuropathy   . Knee pain   . Gout   . Migraines   . Drug-seeking behavior   . Polysubstance abuse     opiates, cocaine, marijuana    Past Surgical History  Procedure Laterality Date  . Back surgery /ray cage fusion comberg, gso    . Toe graft    . Cesarean section    . 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  . Tonsillectomy    . Abdominal hysterectomy    . Dilation and curettage of uterus    . Gallbladder surgery  2010  . Eus  12/2008    Dr. Paulita Fujita. Gastric nodule most consistent with Leiomyoma. Slightly thickened and irregular gallbladder wall, possibly due to sludge or diminutive stones. Recommend EUS in January 2011 to followup gastric nodule.  . Esophagogastroduodenoscopy  11/2008    A 9-mm submucosal lesion seen in the cardia., gastritis, no hpylori  . Colonoscopy  2001    Dr. Laural Golden, hemorrhoids  . Cholecystectomy    . Colonoscopy  10/21/2011    UVO:[id number] polyp multiple/internal hemorrhoids  . Savory dilation  02/03/2012    HKV:[id number] in the distal esophagus/Mild gastritis  . Back surgery    . Abdominal surgery    . Foot surgery  Family History:  Family History  Problem Relation Age of Onset  . Colon cancer Mother     diagnosed in late 40s and died age 53  . Heart failure Mother   . Heart defect      family history   . Arthritis      family history  . COPD      family history   . Cancer      multiple unknown type  . Heart attack Father     deceased at 76  . Hypertension Father   . Heart failure Father   . Lung cancer Maternal Uncle   . Throat cancer Maternal Uncle   . Colon cancer Paternal Uncle   . Colon cancer  Maternal Aunt   . Colon cancer Other   . Anesthesia problems Neg Hx   . Hypotension Neg Hx   . Malignant hyperthermia Neg Hx   . Pseudochol deficiency Neg Hx    Social History:  History  Alcohol Use  . Yes    Comment: occasional holiday drink... pt denied alcohol use on admission to [facility]     History  Drug Use  . Yes  . Special: Marijuana, Cocaine    Comment: pt takes a "puff" of marijuana if she hasn't had anything for nausea-last use 2 months ago, denies any use today 06/18/2014    History   Social History  . Marital Status: Divorced    Spouse Name: N/A  . Number of Children: 2  . Years of Education: 10th grade   Occupational History  . disabled: back problems     Social History Main Topics  . Smoking status: Current Every Day Smoker -- 0.50 packs/day for 35 years    Types: Cigarettes  . Smokeless tobacco: Never Used  . Alcohol Use: Yes     Comment: occasional holiday drink... pt denied alcohol use on admission to [facility]  . Drug Use: Yes    Special: Marijuana, Cocaine     Comment: pt takes a "puff" of marijuana if she hasn't had anything for nausea-last use 2 months ago, denies any use today 06/18/2014  . Sexual Activity: Not on file   Other Topics Concern  . None   Social History Narrative   Additional History:    Sleep: Fair  Appetite:  Poor   Assessment:   Musculoskeletal: Strength & Muscle Tone: within normal limits Gait & Station: normal Patient leans: N/A   Psychiatric Specialty Exam: Physical Exam  Review of Systems  Constitutional: Positive for malaise/fatigue.  HENT: Negative.   Eyes: Negative.   Cardiovascular: Negative.   Gastrointestinal: Positive for abdominal pain.  Genitourinary: Negative.   Musculoskeletal: Positive for back pain and joint pain.  Skin: Negative.   Neurological: Positive for dizziness and weakness.  Endo/Heme/Allergies: Negative.   Psychiatric/Behavioral: Positive for depression and substance abuse. The patient is  nervous/anxious.     Blood pressure 103/54, pulse 64, temperature 98.5 F (36.9 C), temperature source Oral, resp. rate 16, height 5' 1.25" (1.556 m), weight 51.256 kg (113 lb).Body mass index is 21.17 kg/(m^2).  General Appearance: Fairly Groomed  Engineer, water::  Fair  Speech:  Clear and Coherent  Volume:  Decreased  Mood:  Anxious, Depressed and worried  Affect:  anxious depressed worried  Thought Process:  Coherent and Goal Directed  Orientation:  Full (Time, Place, and Person)  Thought Content:  symptoms events worries concerns  Suicidal Thoughts:  No  Homicidal Thoughts:  No  Memory:  Immediate;  Fair Recent;   Fair Remote;   Fair  Judgement:  Fair  Insight:  Present  Psychomotor Activity:  Normal  Concentration:  Fair  Recall:  [employer] of Knowledge:Fair  Language: Fair  Akathisia:  No  Handed:  Right  AIMS (if indicated):     Assets:  Desire for Improvement  ADL's:  Intact  Cognition: WNL  Sleep:  Number of Hours: 6.5     Current Medications: Current Facility-Administered Medications  Medication Dose Route Frequency Provider Last Rate Last Dose  . acetaminophen (TYLENOL) tablet 650 mg  650 mg Oral Q6H PRN Laverle Hobby, PA-C   650 mg at 04/26/15 1497  . alum & mag hydroxide-simeth (MAALOX/MYLANTA) 200-200-20 MG/5ML suspension 30 mL  30 mL Oral Q4H PRN Laverle Hobby, PA-C   30 mL at 04/24/15 1557  . cloNIDine (CATAPRES) tablet 0.1 mg  0.1 mg Oral BH-qamhs Laverle Hobby, PA-C   0.1 mg at 04/26/15 2208   Followed by  . [START ON 04/28/2015] cloNIDine (CATAPRES) tablet 0.1 mg  0.1 mg Oral QAC breakfast Laverle Hobby, PA-C      . dicyclomine (BENTYL) tablet 20 mg  20 mg Oral Q6H PRN Laverle Hobby, PA-C      . FLUoxetine (PROZAC) capsule 20 mg  20 mg Oral Daily Nicholaus Bloom, MD   20 mg at 04/27/15 0263  . gabapentin (NEURONTIN) capsule 600 mg  600 mg Oral TID Laverle Hobby, PA-C   600 mg at 04/27/15 1636  . hydrOXYzine (ATARAX/VISTARIL) tablet 25 mg  25 mg  Oral Q6H PRN Laverle Hobby, PA-C   25 mg at 04/27/15 1132  . loperamide (IMODIUM) capsule 2-4 mg  2-4 mg Oral PRN Laverle Hobby, PA-C      . LORazepam (ATIVAN) tablet 0.5 mg  0.5 mg Oral Q6H PRN Nicholaus Bloom, MD   0.5 mg at 04/27/15 1509  . magnesium hydroxide (MILK OF MAGNESIA) suspension 30 mL  30 mL Oral Daily PRN Laverle Hobby, PA-C      . methocarbamol (ROBAXIN) tablet 750 mg  750 mg Oral QID Nicholaus Bloom, MD   750 mg at 04/27/15 1636  . nicotine (NICODERM CQ - dosed in mg/24 hours) patch 21 mg  21 mg Transdermal Daily Nicholaus Bloom, MD   21 mg at 04/27/15 0808  . nitroGLYCERIN (NITROSTAT) SL tablet 0.4 mg  0.4 mg Sublingual Q5 min PRN Encarnacion Slates, NP   0.4 mg at 04/24/15 1558  . ondansetron (ZOFRAN-ODT) disintegrating tablet 4 mg  4 mg Oral Q6H PRN Laverle Hobby, PA-C   4 mg at 04/27/15 1334  . pantoprazole (PROTONIX) EC tablet 40 mg  40 mg Oral Daily Laverle Hobby, PA-C   40 mg at 04/27/15 7858  . traMADol (ULTRAM) tablet 50 mg  50 mg Oral Q6H PRN Nicholaus Bloom, MD   50 mg at 04/27/15 1333  . traZODone (DESYREL) tablet 50 mg  50 mg Oral QHS,MR X 1 Laverle Hobby, PA-C   50 mg at 04/26/15 2209  . tuberculin injection 5 Units  5 Units Intradermal Once Nicholaus Bloom, MD   5 Units at 04/26/15 1020    Lab Results: No results found for this or any previous visit (from the past 48 hour(s)).  Physical Findings: AIMS: Facial and Oral Movements Muscles of Facial Expression: None, normal Lips and Perioral Area: None, normal Jaw: None, normal Tongue: None, normal,Extremity Movements Upper (  arms, wrists, hands, fingers): None, normal Lower (legs, knees, ankles, toes): None, normal, Trunk Movements Neck, shoulders, hips: None, normal, Overall Severity Severity of abnormal movements (highest score from questions above): None, normal Incapacitation due to abnormal movements: None, normal Patient's awareness of abnormal movements (rate only patient's report): No Awareness, Dental  Status Current problems with teeth and/or dentures?: No Does patient usually wear dentures?: No  CIWA:  CIWA-Ar Total: 9 COWS:  COWS Total Score: 4  Treatment Plan Summary: Daily contact with patient to assess and evaluate symptoms and progress in treatment and Medication management Supportive approach/coping skills Opioid dependence/withdrawal; treat symptomatically as she is not able to take the Clonidine due to her low BF Use Ativan at 0.5 mg Q 6 PRN to ease coming off the opioids watching her BP Depression; continue to work with the Prozac 20 mg  as Cymbalta caused suicidal ideations Use CBT/mindfulness to help with her mood/anxiety/pain Pain; will use the Neurontin for the neuropathic component and at least short term will use the Ultram Explore placement options Medical Decision Making:  Review of Psycho-Social Stressors (1), Review of Medication Regimen & Side Effects (2) and Review of New Medication or Change in Dosage (2)     Yovanny Coats A 04/27/2015, 4:51 PM

## 2015-04-27 NOTE — Progress Notes (Signed)
Patient ID: Jacqueline [name], female   DOB: March 02, 1951, 64 y.o.   MRN: [id number] D)   Was waiting at the med window at the beginning of the shift,  Stated still having some anxiety, wanted to talk about her day and that she has begun to feel a little better.  Affect was brighter, had showered and washed her hair, feels has begun to turn the corner.  Discussed her chronic back pain and possible ways to strengthn her core to give her back more support.  Has been pleasant , cooperative, compliant with meds and program, attended [place] group. AWill continue to monitor for safety, continue POC, support R)  Safety maintained.

## 2015-04-28 DIAGNOSIS — F1123 Opioid dependence with withdrawal: Secondary | ICD-10-CM

## 2015-04-28 DIAGNOSIS — F329 Major depressive disorder, single episode, unspecified: Secondary | ICD-10-CM

## 2015-04-28 MED ORDER — POLYETHYLENE GLYCOL 3350 17 G PO PACK: 17.0000 g | PACK | Freq: Every day | ORAL | Status: DC | PRN

## 2015-04-28 MED ORDER — PHENYLEPH-SHARK LIV OIL-MO-PET 0.25-3-14-71.9 % RE OINT
TOPICAL_OINTMENT | Freq: Two times a day (BID) | RECTAL | Status: DC | PRN
  Administered 2015-04-28: 22:00:00 via RECTAL
  Filled 2015-04-28: qty 28.4

## 2015-04-28 MED ORDER — MAGNESIUM CITRATE PO SOLN
1.0000 | Freq: Once | ORAL | Status: AC
  Administered 2015-04-28: 1 via ORAL

## 2015-04-28 NOTE — BHH Group Notes (Signed)
[facility] Group Notes:  (Clinical Social Work)  04/28/2015     10-11AM  Summary of Progress/Problems:   The main focus of today's process group was to learn how to use a decisional balance exercise to move forward in the Stages of Change, which were described and discussed.  Motivational Interviewing and a worksheet were utilized to help patients explore in depth the perceived benefits and costs of unhealthy coping techniques, as well as the  benefits and costs of replacing that with a healthy coping skills.   The patient expressed that her unhealthy coping involves using substances to numb her physical and emotional pain, as well as self-neglect.  She talked throughout group about her issues, volunteered and did not wait to be called on.  Her affect was flat and depressed for the entire group.  She stated her level of motivation to change the substance use is "9" on a scale of 1-10 (with 10 being the highest level of motivation).  The barrier she identified to being completely committed/motivated is her back pain, and she stated that she needs to know that she will have means of controlling her pain.  Type of Therapy:  Group Therapy - Process   Participation Level:  Active  Participation Quality:  Drowsy and Sharing  Affect:  Depressed and Flat  Cognitive:  Alert and Oriented  Insight:  Engaged  Engagement in Therapy:  Engaged  Modes of Intervention:  Education, Motivational Interviewing  Selmer Dominion, [place] 04/28/2015, 4:30 PM

## 2015-04-28 NOTE — Progress Notes (Signed)
Patient ID: Jacqueline [name], female   DOB: [birth year], 64 y.o.   MRN: [id number] D)   Started to go to group this evening, but had to leave d/t laxatives taken earlier, has had good results.  Order obtained for Preparation H d/t swollen hemorrhoids.  States is feeling better this evening, getting her sense of humor back.  Stated she is hopeful that she can get into a long term program in [place], wants to get into her own apt again, get some of her furniture back and finally begin to feel settled.  States hasn't felt settled or had her own place since her divorce over 10 years ago.  Has been living with friends, with family, boyfriends, etc. Feels good that she has reached this point,  Feels she can accomplish these goals.  I s reestablishing relationships with her children. A)  Support, encouragement to continue program when she leaves, which she intends to do.  Will continue to monitor for safety, continue POC R)  Safety maintained.

## 2015-04-28 NOTE — Progress Notes (Signed)
Patient did not attend the evening speaker [place] meeting. Pt did attempt to go but had taken medicine to help her go to the bathroom and was frequently going. Pt remained in her room close to the bathroom during group time.

## 2015-04-28 NOTE — Progress Notes (Signed)
Patient ID: Jacqueline [name], female   DOB: 03-Jul-1951, 64 y.o.   MRN: [id number]  Pt currently presents with a sad affect and pleasant behavior. Pt affects brightens upon approach. Per self inventory, pt rates depression at a 3, hopelessness 3 and anxiety 5-6. Pt's daily goal is "getting info about discharge plan" and they intend to do so by "anything I can." Pt reports good sleep, good concentration and a good appetite. Pt reports increasing constipation and bloating throughout the morning.   Pt provided with medications per providers orders. Pt given multiple constipation relieving medications. Pt's labs and vitals were monitored throughout the day. Pt supported emotionally and encouraged to express concerns and questions. Pt educated on medications and diet/nutrition.   Pt's safety ensured with 15 minute and environmental checks. Pt currently denies SI/HI and A/V hallucinations. Pt verbally agrees to seek staff if SI/HI or A/VH occurs and to consult with staff before acting on these thoughts.

## 2015-04-28 NOTE — Progress Notes (Signed)
Adult Psychoeducational Group Note  Date:  04/28/2015 Time: 02:00pm  Group Topic/Focus:  Orientation:   The focus of this group is to educate the patient on the purpose and policies of crisis stabilization and provide a format to answer questions about their admission.  The group details unit policies and expectations of patients while admitted.  Participation Level:  Active  Participation Quality:  Appropriate  Affect:  Flat  Cognitive:  Alert and Oriented  Insight: Improving  Engagement in Group:  Engaged  Modes of Intervention:  Activity, Discussion, Education and Support  Additional Comments:  Pt engaged in discussing healthy and unhealthy decisions. Pt to identify 20 positive personal affirmations.  Elenore Rota 04/28/2015, 3:29 PM

## 2015-04-28 NOTE — Progress Notes (Signed)
Patient ID: Jacqueline [name], female   DOB: 1951-08-03, 63 y.o.   MRN: [id number] Adult Psychoeducational Group Note  Date:  04/28/2015 Time: 09:15 am  Group Topic/Focus:  Orientation:   The focus of this group is to educate the patient on the purpose and policies of crisis stabilization and provide a format to answer questions about their admission.  The group details unit policies and expectations of patients while admitted.  Participation Level:  Active  Participation Quality:  Attentive  Affect:  Anxious  Cognitive:  Oriented  Insight: Improving  Engagement in Group:  Engaged   Modes of Intervention:  Discussion, Education, Orientation and Support  Additional Comments:  Pt able to identify her current medical needs and advocate for her care.   Elenore Rota 04/28/2015, 12:39 PM

## 2015-04-28 NOTE — Progress Notes (Signed)
Patient ID: Jacqueline [name], female   DOB: [birth year], 64 y.o.   MRN: [id number] [facility] MD Progress Note  04/28/2015 3:30 PM Jacqueline [name]  MRN:  [id number] Subjective:  Jacqueline [name] is still endorsing residual withdrawal symptoms. She has not been able to take any more clonidine due to her low BP. She is still worried and feeling overwhelmed not sure of how she is going to deal with her chronic pain. She has her back, her stomach that bother her. When she starts thinking about this she gets very overwhelmed with a sense of hopelessness and helplessness. States she tries to change her focus and think about something else. Patient reports her back is hurting her worse today due to constipation. Has not had a bowel movement she estimates in several days. Reviewed patient's blood pressure which has been running low for the past several days. 93/63 this morning. Patient has been observed ambulating around the unit with no complaints of falls or dizziness.   Principal Problem: Opioid type dependence, continuous Diagnosis:   Patient Active Problem List   Diagnosis Date Noted  . Substance induced mood disorder [F19.94] 04/24/2015  . Opioid type dependence, continuous [F11.20] 04/24/2015  . Severe major depression without psychotic features [F32.2] 04/24/2015  . PTSD (post-traumatic stress disorder) [F43.10] 04/24/2015  . Chest pain, localized [R07.89] 04/28/2012  . Odynophagia [R13.10] 09/30/2011  . Colon cancer screening [Z12.11] 09/30/2011  . Gastric tumor [D37.1] 09/30/2011  . CLOSED FRACTURE OF UPPER END OF FIBULA [S82.839A] 07/31/2010  . DYSPHAGIA [R13.19] 07/17/2009  . ABDOMINAL PAIN, GENERALIZED [R10.84] 07/17/2009  . GASTRITIS [K29.70, K29.90] 07/12/2009  . IBS [K58.9] 07/12/2009  . RUQ PAIN [R10.11] 07/12/2009  . EPIGASTRIC PAIN [R10.13] 07/12/2009  . PANCREATITIS, ACUTE, HX OF [Z87.19] 07/12/2009  . Osteoarthrosis, unspecified whether generalized or localized, hand [M19.049] 11/09/2008  .  OSTEOARTHRITIS, LOWER LEG [M17.10] 10/25/2008  . DERANGEMENT MENISCUS [M23.302] 10/16/2008  . KNEE PAIN [M25.569] 10/16/2008   Total Time spent with patient: 20 minutes  Past Medical History:  Past Medical History  Diagnosis Date  . Gastric nodule 2009    EUS, ?leiomyoma  . Irritable bowel syndrome   . History of pancreatitis     Biliary and/or etoh?  Marland Kitchen Anxiety   . Chronic back pain   . GERD (gastroesophageal reflux disease)   . Migraines   . 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 Peripheral neuropathy   . Knee pain   . Gout   . Migraines   . Drug-seeking behavior   . Polysubstance abuse     opiates, cocaine, marijuana    Past Surgical History  Procedure Laterality Date  . Back surgery /ray cage fusion comberg, gso    . Toe graft    . Cesarean section    . 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  . Tonsillectomy    . Abdominal hysterectomy    . Dilation and curettage of uterus    . Gallbladder surgery  2010  . Eus  12/2008    Dr. Paulita Fujita. Gastric nodule most consistent with Leiomyoma. Slightly thickened and irregular gallbladder wall, possibly due to sludge or diminutive stones. Recommend EUS in January 2011 to followup gastric nodule.  . Esophagogastroduodenoscopy  11/2008  A 9-mm submucosal lesion seen in the cardia., gastritis, no hpylori  . Colonoscopy  2001    Dr. Laural Golden, hemorrhoids  . Cholecystectomy    . Colonoscopy  10/21/2011    XAJ:OINOMVE polyp multiple/internal hemorrhoids  . Savory dilation  02/03/2012    [facility]:NOBSJGGEZ in the distal esophagus/Mild gastritis  . Back surgery    . Abdominal surgery    . Foot surgery     Family History:  Family History  Problem Relation Age of Onset  . Colon cancer  Mother     diagnosed in late 73s and died age 18  . Heart failure Mother   . Heart defect      family history   . Arthritis      family history  . COPD      family history   . Cancer      multiple unknown type  . Heart attack Father     deceased at 26  . Hypertension Father   . Heart failure Father   . Lung cancer Maternal Uncle   . Throat cancer Maternal Uncle   . Colon cancer Paternal Uncle   . Colon cancer Maternal Aunt   . Colon cancer Other   . Anesthesia problems Neg Hx   . Hypotension Neg Hx   . Malignant hyperthermia Neg Hx   . Pseudochol deficiency Neg Hx    Social History:  History  Alcohol Use  . Yes    Comment: occasional holiday drink... pt denied alcohol use on admission to [facility]     History  Drug Use  . Yes  . Special: Marijuana, Cocaine    Comment: pt takes a "puff" of marijuana if she hasn't had anything for nausea-last use 2 months ago, denies any use today 06/18/2014    History   Social History  . Marital Status: Divorced    Spouse Name: N/A  . Number of Children: 2  . Years of Education: 10th grade   Occupational History  . disabled: back problems     Social History Main Topics  . Smoking status: Current Every Day Smoker -- 0.50 packs/day for 35 years    Types: Cigarettes  . Smokeless tobacco: Never Used  . Alcohol Use: Yes     Comment: occasional holiday drink... pt denied alcohol use on admission to [facility]  . Drug Use: Yes    Special: Marijuana, Cocaine     Comment: pt takes a "puff" of marijuana if she hasn't had anything for nausea-last use 2 months ago, denies any use today 06/18/2014  . Sexual Activity: Not on file   Other Topics Concern  . None   Social History Narrative   Additional History:    Sleep: Fair  Appetite:  Fair  Assessment:   Musculoskeletal: Strength & Muscle Tone: within normal limits Gait & Station: normal Patient leans: N/A  Psychiatric Specialty Exam: Physical Exam  Review of Systems   Constitutional: Positive for chills.  HENT: Negative.   Eyes: Negative.   Respiratory: Negative.   Cardiovascular: Negative.   Gastrointestinal: Positive for constipation.  Genitourinary: Negative.   Musculoskeletal: Positive for back pain.  Skin: Negative.   Neurological: Negative.   Endo/Heme/Allergies: Negative.   Psychiatric/Behavioral: Positive for depression and substance abuse. Negative for suicidal ideas, hallucinations and memory loss. The patient is nervous/anxious. The patient does not have insomnia.     Blood pressure 93/63, pulse 58, temperature 98.6 F (37 C), temperature source Oral, resp. rate 16, height 5' 1.25" (1.556  m), weight 51.256 kg (113 lb).Body mass index is 21.17 kg/(m^2).  General Appearance: Fairly Groomed  Engineer, water::  Fair  Speech:  Clear and Coherent  Volume:  Decreased  Mood:  Anxious, Depressed and worried  Affect:  Congruent  Thought Process:  Coherent and Goal Directed  Orientation:  Full (Time, Place, and Person)  Thought Content:  symptoms events worries concerns  Suicidal Thoughts:  No  Homicidal Thoughts:  No  Memory:  Immediate;   Fair Recent;   Fair Remote;   Fair  Judgement:  Fair  Insight:  Present  Psychomotor Activity:  Normal  Concentration:  Fair  Recall:  [employer] of Knowledge:Fair  Language: Fair  Akathisia:  No  Handed:  Right  AIMS (if indicated):     Assets:  Communication Skills Desire for Improvement Resilience  ADL's:  Intact  Cognition: WNL  Sleep:  Number of Hours: 5.75   Current Medications: Current Facility-Administered Medications  Medication Dose Route Frequency Provider Last Rate Last Dose  . acetaminophen (TYLENOL) tablet 650 mg  650 mg Oral Q6H PRN Laverle Hobby, PA-C   650 mg at 04/26/15 4818  . alum & mag hydroxide-simeth (MAALOX/MYLANTA) 200-200-20 MG/5ML suspension 30 mL  30 mL Oral Q4H PRN Laverle Hobby, PA-C   30 mL at 04/24/15 1557  . cloNIDine (CATAPRES) tablet 0.1 mg  0.1 mg Oral QAC  breakfast Laverle Hobby, PA-C   Stopped at 04/28/15 [phone]  . dicyclomine (BENTYL) tablet 20 mg  20 mg Oral Q6H PRN Laverle Hobby, PA-C   20 mg at 04/28/15 4970  . FLUoxetine (PROZAC) capsule 20 mg  20 mg Oral Daily Nicholaus Bloom, MD   20 mg at 04/28/15 2637  . gabapentin (NEURONTIN) capsule 600 mg  600 mg Oral TID Laverle Hobby, PA-C   600 mg at 04/28/15 1207  . hydrOXYzine (ATARAX/VISTARIL) tablet 25 mg  25 mg Oral Q6H PRN Laverle Hobby, PA-C   25 mg at 04/28/15 1401  . loperamide (IMODIUM) capsule 2-4 mg  2-4 mg Oral PRN Laverle Hobby, PA-C      . LORazepam (ATIVAN) tablet 0.5 mg  0.5 mg Oral Q6H PRN Nicholaus Bloom, MD   0.5 mg at 04/28/15 1507  . magnesium hydroxide (MILK OF MAGNESIA) suspension 30 mL  30 mL Oral Daily PRN Laverle Hobby, PA-C   30 mL at 04/28/15 0829  . methocarbamol (ROBAXIN) tablet 750 mg  750 mg Oral QID Nicholaus Bloom, MD   750 mg at 04/28/15 1207  . nicotine (NICODERM CQ - dosed in mg/24 hours) patch 21 mg  21 mg Transdermal Daily Nicholaus Bloom, MD   21 mg at 04/28/15 8588  . nitroGLYCERIN (NITROSTAT) SL tablet 0.4 mg  0.4 mg Sublingual Q5 min PRN Encarnacion Slates, NP   0.4 mg at 04/24/15 1558  . ondansetron (ZOFRAN-ODT) disintegrating tablet 4 mg  4 mg Oral Q6H PRN Laverle Hobby, PA-C   4 mg at 04/28/15 5027  . pantoprazole (PROTONIX) EC tablet 40 mg  40 mg Oral Daily Laverle Hobby, PA-C   40 mg at 04/28/15 7412  . polyethylene glycol (MIRALAX / GLYCOLAX) packet 17 g  17 g Oral Daily PRN Niel Hummer, NP      . traMADol Veatrice Bourbon) tablet 50 mg  50 mg Oral Q6H PRN Nicholaus Bloom, MD   50 mg at 04/28/15 1253  . traZODone (DESYREL) tablet 50 mg  50  mg Oral QHS,MR X 1 Laverle Hobby, PA-C   50 mg at 04/27/15 2202    Lab Results: No results found for this or any previous visit (from the past 48 hour(s)).  Physical Findings: AIMS: Facial and Oral Movements Muscles of Facial Expression: None, normal Lips and Perioral Area: None, normal Jaw: None, normal Tongue:  None, normal,Extremity Movements Upper (arms, wrists, hands, fingers): None, normal Lower (legs, knees, ankles, toes): None, normal, Trunk Movements Neck, shoulders, hips: None, normal, Overall Severity Severity of abnormal movements (highest score from questions above): None, normal Incapacitation due to abnormal movements: None, normal Patient's awareness of abnormal movements (rate only patient's report): No Awareness, Dental Status Current problems with teeth and/or dentures?: No Does patient usually wear dentures?: No  CIWA:  CIWA-Ar Total: 9 COWS:  COWS Total Score: 5  Treatment Plan Summary: Daily contact with patient to assess and evaluate symptoms and progress in treatment and Medication management Supportive approach/coping skills Opioid dependence/withdrawal; treat symptomatically as she is not able to take the Clonidine due to her low BF Use Ativan at 0.5 mg Q 6 PRN to ease coming off the opioids watching her BP Depression; continue to work with the Prozac 20 mg  as Cymbalta caused suicidal ideations Use CBT/mindfulness to help with her mood/anxiety/pain Pain; will use the Neurontin for the neuropathic component and at least short term will use the Ultram Explore placement options Give Magnesium Citrate one bottle times one for constipation. Order Miralax 17 grams daily prn constipation.  Medical Decision Making:  Review of Psycho-Social Stressors (1), Review of Medication Regimen & Side Effects (2) and Review of New Medication or Change in Dosage (2)  DAVIS, LAURA NP-C 04/28/2015, 3:30 PM   Agree with NP Progress Note  Neita Garnet, MD

## 2015-04-29 NOTE — Progress Notes (Signed)
Attend group 

## 2015-04-29 NOTE — Progress Notes (Signed)
Patient ID: Jacqueline [name], female   DOB: [birth year], 64 y.o.   MRN: [id number] Patient ID: Jacqueline [name], female   DOB: 02-21-1951, 64 y.o.   MRN: [id number] [facility] MD Progress Note  04/29/2015 2:26 PM Jacqueline [name]  MRN:  [id number]  Subjective: Jacqueline [name] says she is starting to feel a lot better. Is hoping to be discharged to a residential treatment center to continue substance abuse treatment after discharge. Says she knew she was running with a bad circle because of her drug addiction. Has to come here to get help to get into some place. She says her addiction started the opioid meant for her chronic pain. She denies any SIHI, AVH.  Principal Problem: Opioid type dependence, continuous Diagnosis:   Patient Active Problem List   Diagnosis Date Noted  . Substance induced mood disorder [F19.94] 04/24/2015  . Opioid type dependence, continuous [F11.20] 04/24/2015  . Severe major depression without psychotic features [F32.2] 04/24/2015  . PTSD (post-traumatic stress disorder) [F43.10] 04/24/2015  . Chest pain, localized [R07.89] 04/28/2012  . Odynophagia [R13.10] 09/30/2011  . Colon cancer screening [Z12.11] 09/30/2011  . Gastric tumor [D37.1] 09/30/2011  . CLOSED FRACTURE OF UPPER END OF FIBULA [S82.839A] 07/31/2010  . DYSPHAGIA [R13.19] 07/17/2009  . ABDOMINAL PAIN, GENERALIZED [R10.84] 07/17/2009  . GASTRITIS [K29.70, K29.90] 07/12/2009  . IBS [K58.9] 07/12/2009  . RUQ PAIN [R10.11] 07/12/2009  . EPIGASTRIC PAIN [R10.13] 07/12/2009  . PANCREATITIS, ACUTE, HX OF [Z87.19] 07/12/2009  . Osteoarthrosis, unspecified whether generalized or localized, hand [M19.049] 11/09/2008  . OSTEOARTHRITIS, LOWER LEG [M17.10] 10/25/2008  . DERANGEMENT MENISCUS [M23.302] 10/16/2008  . KNEE PAIN [M25.569] 10/16/2008   Total Time spent with patient: 20 minutes  Past Medical History:  Past Medical History  Diagnosis Date  . Gastric nodule 2009    EUS, ?leiomyoma  . Irritable bowel syndrome   . History  of pancreatitis     Biliary and/or etoh?  Marland Kitchen Anxiety   . Chronic back pain   . GERD (gastroesophageal reflux disease)   . Migraines   . 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 Peripheral neuropathy   . Knee pain   . Gout   . Migraines   . Drug-seeking behavior   . Polysubstance abuse     opiates, cocaine, marijuana    Past Surgical History  Procedure Laterality Date  . Back surgery /ray cage fusion comberg, gso    . Toe graft    . Cesarean section    . 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  . Tonsillectomy    . Abdominal hysterectomy    . Dilation and curettage of uterus    . Gallbladder surgery  2010  . Eus  12/2008    Dr. Paulita Fujita. Gastric nodule most consistent with Leiomyoma. Slightly thickened and irregular gallbladder wall, possibly due to sludge or diminutive stones. Recommend EUS in January 2011 to followup gastric nodule.  . Esophagogastroduodenoscopy  11/2008    A 9-mm submucosal lesion seen in the cardia., gastritis, no hpylori  . Colonoscopy  2001    Dr. Laural Golden, hemorrhoids  . Cholecystectomy    . Colonoscopy  10/21/2011    LNL:GXQJJHE polyp multiple/internal hemorrhoids  . Savory dilation  02/03/2012  SEG:[id number] in the distal esophagus/Mild gastritis  . Back surgery    . Abdominal surgery    . Foot surgery     Family History:  Family History  Problem Relation Age of Onset  . Colon cancer Mother     diagnosed in late 43s and died age 22  . Heart failure Mother   . Heart defect      family history   . Arthritis      family history  . COPD      family history   . Cancer      multiple unknown type  . Heart attack Father     deceased at 53  . Hypertension Father   . Heart failure Father   . Lung  cancer Maternal Uncle   . Throat cancer Maternal Uncle   . Colon cancer Paternal Uncle   . Colon cancer Maternal Aunt   . Colon cancer Other   . Anesthesia problems Neg Hx   . Hypotension Neg Hx   . Malignant hyperthermia Neg Hx   . Pseudochol deficiency Neg Hx    Social History:  History  Alcohol Use  . Yes    Comment: occasional holiday drink... pt denied alcohol use on admission to [facility]     History  Drug Use  . Yes  . Special: Marijuana, Cocaine    Comment: pt takes a "puff" of marijuana if she hasn't had anything for nausea-last use 2 months ago, denies any use today 06/18/2014    History   Social History  . Marital Status: Divorced    Spouse Name: N/A  . Number of Children: 2  . Years of Education: 10th grade   Occupational History  . disabled: back problems     Social History Main Topics  . Smoking status: Current Every Day Smoker -- 0.50 packs/day for 35 years    Types: Cigarettes  . Smokeless tobacco: Never Used  . Alcohol Use: Yes     Comment: occasional holiday drink... pt denied alcohol use on admission to [facility]  . Drug Use: Yes    Special: Marijuana, Cocaine     Comment: pt takes a "puff" of marijuana if she hasn't had anything for nausea-last use 2 months ago, denies any use today 06/18/2014  . Sexual Activity: Not on file   Other Topics Concern  . None   Social History Narrative   Additional History:    Sleep: Fair  Appetite:  Fair  Assessment:   Musculoskeletal: Strength & Muscle Tone: within normal limits Gait & Station: normal Patient leans: N/A  Psychiatric Specialty Exam: Physical Exam  ROS  Blood pressure 100/65, pulse 63, temperature 98.6 F (37 C), temperature source Oral, resp. rate 24, height 5' 1.25" (1.556 m), weight 51.256 kg (113 lb).Body mass index is 21.17 kg/(m^2).  General Appearance: Fairly Groomed  Engineer, water::  Fair  Speech:  Clear and Coherent  Volume:  Decreased  Mood:  Anxious, Depressed and worried  Affect:   Congruent  Thought Process:  Coherent and Goal Directed  Orientation:  Full (Time, Place, and Person)  Thought Content:  symptoms events worries concerns  Suicidal Thoughts:  No  Homicidal Thoughts:  No  Memory:  Immediate;   Fair Recent;   Fair Remote;   Fair  Judgement:  Fair  Insight:  Present  Psychomotor Activity:  Normal  Concentration:  Fair  Recall:  [employer] of Knowledge:Fair  Language: Fair  Akathisia:  No  Handed:  Right  AIMS (if indicated):     Assets:  Communication Skills Desire for Improvement Resilience  ADL's:  Intact  Cognition: WNL  Sleep:  Number of Hours: 5.75   Current Medications: Current Facility-Administered Medications  Medication Dose Route Frequency Provider Last Rate Last Dose  . acetaminophen (TYLENOL) tablet 650 mg  650 mg Oral Q6H PRN Laverle Hobby, PA-C   650 mg at 04/29/15 0802  . alum & mag hydroxide-simeth (MAALOX/MYLANTA) 200-200-20 MG/5ML suspension 30 mL  30 mL Oral Q4H PRN Laverle Hobby, PA-C   30 mL at 04/24/15 1557  . cloNIDine (CATAPRES) tablet 0.1 mg  0.1 mg Oral QAC breakfast Laverle Hobby, PA-C   0.1 mg at 04/29/15 0800  . FLUoxetine (PROZAC) capsule 20 mg  20 mg Oral Daily Nicholaus Bloom, MD   20 mg at 04/29/15 0800  . gabapentin (NEURONTIN) capsule 600 mg  600 mg Oral TID Laverle Hobby, PA-C   600 mg at 04/29/15 1201  . LORazepam (ATIVAN) tablet 0.5 mg  0.5 mg Oral Q6H PRN Nicholaus Bloom, MD   0.5 mg at 04/29/15 1202  . magnesium hydroxide (MILK OF MAGNESIA) suspension 30 mL  30 mL Oral Daily PRN Laverle Hobby, PA-C   30 mL at 04/28/15 0829  . methocarbamol (ROBAXIN) tablet 750 mg  750 mg Oral QID Nicholaus Bloom, MD   750 mg at 04/29/15 1201  . nicotine (NICODERM CQ - dosed in mg/24 hours) patch 21 mg  21 mg Transdermal Daily Nicholaus Bloom, MD   21 mg at 04/29/15 [phone]  . nitroGLYCERIN (NITROSTAT) SL tablet 0.4 mg  0.4 mg Sublingual Q5 min PRN Encarnacion Slates, NP   0.4 mg at 04/24/15 1558  . pantoprazole (PROTONIX) EC  tablet 40 mg  40 mg Oral Daily Laverle Hobby, PA-C   40 mg at 04/29/15 0759  . phenylephrine-shark liver oil-mineral oil-petrolatum (PREPARATION H) rectal ointment   Rectal BID PRN Dara Hoyer, PA-C      . polyethylene glycol (MIRALAX / GLYCOLAX) packet 17 g  17 g Oral Daily PRN Niel Hummer, NP      . traMADol Veatrice Bourbon) tablet 50 mg  50 mg Oral Q6H PRN Nicholaus Bloom, MD   50 mg at 04/29/15 0931  . traZODone (DESYREL) tablet 50 mg  50 mg Oral QHS,MR X 1 Spencer E Simon, PA-C   50 mg at 04/28/15 2230   Lab Results: No results found for this or any previous visit (from the past 48 hour(s)).  Physical Findings: AIMS: Facial and Oral Movements Muscles of Facial Expression: None, normal Lips and Perioral Area: None, normal Jaw: None, normal Tongue: None, normal,Extremity Movements Upper (arms, wrists, hands, fingers): None, normal Lower (legs, knees, ankles, toes): None, normal, Trunk Movements Neck, shoulders, hips: None, normal, Overall Severity Severity of abnormal movements (highest score from questions above): None, normal Incapacitation due to abnormal movements: None, normal Patient's awareness of abnormal movements (rate only patient's report): No Awareness, Dental Status Current problems with teeth and/or dentures?: No Does patient usually wear dentures?: No  CIWA:  CIWA-Ar Total: 9 COWS:  COWS Total Score: 5  Treatment Plan Summary: Daily contact with patient to assess and evaluate symptoms and progress in treatment and Medication management Supportive approach/coping skills Opioid dependence/withdrawal; treat symptomatically as she is not able to take the Clonidine due to her low BF Use Ativan at 0.5 mg Q 6 PRN to ease coming off the opioids watching her  BP Depression; continue to work with the Prozac 20 mg  as Cymbalta caused suicidal ideations Use CBT/mindfulness to help with her mood/anxiety/pain Pain; will use the Neurontin for the neuropathic component and at least  short term will use the Ultram Explore placement options Give Magnesium Citrate one bottle times one for constipation. Order Miralax 17 grams daily prn constipation.   Medical Decision Making:  Review of Psycho-Social Stressors (1), Review of Medication Regimen & Side Effects (2) and Review of New Medication or Change in Dosage (2)  Encarnacion Slates PMHNP-BC 04/29/2015, 2:26 PM   Agree with NP Progress Note  Neita Garnet, MD

## 2015-04-29 NOTE — Progress Notes (Signed)
Patient ID: Jacqueline [name], female   DOB: Oct 03, 1951, 64 y.o.   MRN: [id number]  Pt currently presents with a flat affect and depressed behavior. Pt brightens upon approach. Per self inventory, pt rates depression at a 2, hopelessness 3 and anxiety 5. Pt's daily goal is "finding out about discharge plans" and they intend to do so by "anything." Pt also writes "Jacqueline [name] have been awesome." Pt reports good sleep, concentration and appetite.  Pt provided with medications per providers orders. Pt's labs and vitals were monitored throughout the day. Pt supported emotionally and encouraged to express concerns and questions. Pt educated on medications and substance abuse. Pt's safety ensured with 15 minute and environmental checks. Pt currently denies SI/HI and A/V hallucinations. Pt verbally agrees to seek staff if SI/HI or A/VH occurs and to consult with staff before acting on these thoughts. Pt states "I'd like to go to [employer]. If I go there I can get right before I get my apartment. I want to get a place of my own, I haven't since I split with my husband. I want things around me again."

## 2015-04-29 NOTE — Plan of Care (Signed)
Problem: Ineffective individual coping Goal: STG:Pt. will utilize relaxation techniques to reduce stress STG: Patient will utilize relaxation techniques to reduce stress levels  Outcome: Progressing Pt used stress ball and deep breathing during period of anxiety today.

## 2015-04-29 NOTE — Progress Notes (Signed)
D.  Pt anxious but pleasant on approach, denies other complaints at this time.  Positive for evening AA group, interacting appropriately with peers on the unit.  Denies SI/HI/hallucinations at this time.  A.  Support and encouragement offered  R.  Pt remains safe on unit, will continue to monitor.

## 2015-04-29 NOTE — [facility] Group Notes (Signed)
[place] Group Notes:  (Clinical Social Work)  04/29/2015  10:00-11:00AM  Summary of Progress/Problems:   The main focus of today's process group was to   1)  discuss the importance of adding supports  2)  define health supports versus unhealthy supports  3)  identify the patient's current unhealthy supports and plan how to handle them  4)  Identify the patient's current healthy supports and plan what to add.  An emphasis was placed on using counselor, doctor, therapy groups, 12-step groups, and problem-specific support groups to expand supports.    The patient expressed full comprehension of the concepts presented, and agreed that there is a need to add more supports.  The patient stated God and her daughter are healthy supports. The patient stated she has a plan to handle unhealthy supports by "letting my phone run out," and not giving out her telephone number.   Type of Therapy:  Process Group with Motivational Interviewing  Participation Level:  Active  Participation Quality:  Attentive  Affect:  Flat  Cognitive: Alert and Oriented  Insight:  Engaged  Engagement in Therapy:  Engaged  Modes of Intervention:   Education, Psychiatric nurse, Activity  Maricus Tanzi A. McLean, LCSW 04/29/2015

## 2015-04-30 DIAGNOSIS — F332 Major depressive disorder, recurrent severe without psychotic features: Secondary | ICD-10-CM | POA: Insufficient documentation

## 2015-04-30 MED ORDER — OMEPRAZOLE 40 MG PO CPDR: 40.0000 mg | DELAYED_RELEASE_CAPSULE | Freq: Two times a day (BID) | ORAL | Status: DC

## 2015-04-30 MED ORDER — TRAMADOL HCL 50 MG PO TABS: 50.0000 mg | ORAL_TABLET | Freq: Four times a day (QID) | ORAL | Status: DC | PRN

## 2015-04-30 MED ORDER — METHOCARBAMOL 750 MG PO TABS: 750.0000 mg | ORAL_TABLET | Freq: Four times a day (QID) | ORAL | Status: DC

## 2015-04-30 MED ORDER — TRAZODONE HCL 50 MG PO TABS: 50.0000 mg | ORAL_TABLET | Freq: Every evening | ORAL | Status: DC | PRN

## 2015-04-30 MED ORDER — FLUOXETINE HCL 20 MG PO CAPS: 20.0000 mg | ORAL_CAPSULE | Freq: Every day | ORAL | Status: DC

## 2015-04-30 MED ORDER — NICOTINE 21 MG/24HR TD PT24: 21.0000 mg | MEDICATED_PATCH | Freq: Every day | TRANSDERMAL | Status: DC

## 2015-04-30 MED ORDER — GABAPENTIN 300 MG PO CAPS: 600.0000 mg | ORAL_CAPSULE | Freq: Three times a day (TID) | ORAL | Status: AC

## 2015-04-30 NOTE — Progress Notes (Addendum)
Discharge note:  Patient discharged home per MD order.  Patient received all personal belongings, prescriptions and medication samples.  She denies SI/HI/AVH.  Reviewed discharge instructions and medications and patient indicated understanding.  Patient left ambulatory with her daughter.

## 2015-04-30 NOTE — Progress Notes (Signed)
Recreation Therapy Notes  Date: 05.09.2016 Time: 9:30am Location: [address] Dayroom   Group Topic: Stress Management  Goal Area(s) Addresses:  Patient will actively participate in stress management techniques presented during session.   Behavioral Response: Appropriate, Engaged   Intervention: Stress management techniques  Activity :  Deep Breathing Guided Imagery. LRT provided instruction and demonstration on practice of Guided Imagery. Technique was coupled with deep breathing.   Education:  Stress Management, Discharge Planning.   Education Outcome: Acknowledges education  Clinical Observations/Feedback: Patient actively participated in presented technique and demonstrated ability to practice independently post d/c.   Laureen Ochs Neldon Shepard, LRT/CTRS        Lane Hacker 04/30/2015 3:41 PM

## 2015-04-30 NOTE — [facility] Group Notes (Signed)
[place] LCSW Group Therapy 04/30/2015  1:15 pm  Type of Therapy: Group Therapy Participation Level: Active  Participation Quality: Attentive, Sharing and Supportive  Affect: Appropriate  Cognitive: Alert and Oriented  Insight: Developing/Improving and Engaged  Engagement in Therapy: Developing/Improving and Engaged  Modes of Intervention: Clarification, Confrontation, Discussion, Education, Exploration,  Limit-setting, Orientation, Problem-solving, Rapport Building, Art therapist, Socialization and Support  Summary of Progress/Problems: Pt identified obstacles faced currently and processed barriers involved in overcoming these obstacles. Pt identified steps necessary for overcoming these obstacles and explored motivation (internal and external) for facing these difficulties head on. Pt further identified one area of concern in their lives and chose a goal to focus on for today. Patient identified her relationship with her son as an obstacle to her recovery due to him actively using drugs. Patient discussed the need to set boundaries with him in order to make her recovery a priority. Patient also debated the "pros and cons" of changing her cell phone number. CSW and other group members provided patient with emotional support and encouragement.   [name], MSW, [place] Worker [facility] [phone]

## 2015-04-30 NOTE — [facility] Group Notes (Signed)
   [facility] LCSW Aftercare Discharge Planning Group Note  04/30/2015  8:45 AM   Participation Quality: Alert, Appropriate and Oriented  Mood/Affect: Appropriate  Depression Rating: 3  Anxiety Rating: 6  Thoughts of Suicide: Pt denies SI/HI  Will you contract for safety? Yes  Current AVH: Pt denies  Plan for Discharge/Comments: Pt attended discharge planning group and actively participated in group. CSW provided pt with today's workbook. Patient reports experiencing ankle and back pain today. She is interested in treatment at [facility] in [place].   Transportation Means: Pt reports access to transportation  Supports: No supports mentioned at this time  [name], MSW, [place] Social Worker [employer] [phone]

## 2015-04-30 NOTE — Progress Notes (Signed)
D: Patient unsteady on her feet.  Her roommate assisted her to the medication window.  Patient's bp was taken manually and it remains on the low side.  She reports high anxiety and pain in her lower back.  She was given 0.5 ativan po and tramadol 50 po.  She denies SI/HI/AVH.  Patient is hoping to receive long term tx after d/c.  She is a possible d/c today or tomorrow.  She denies any severe depressive symptoms, rating her depression and hopelessness as a 3.  She rates her anxiety at a 6.  She is sleeping and eating well per self inventory.  She wants to "work on her discharge plans today."   A: Continue to monitor medication management and MD orders. Safety checks completed every 15 minutes per protocol.  Meet 1:1 with patient to discuss concerns and offer encouragement. R: Patient's behavior is appropriate to situation.

## 2015-04-30 NOTE — Clinical Social Work Note (Addendum)
Patient not eligible for [facility] while on tramadol.   [name], MSW, [place] Worker [facility] [phone]
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
## 2015-04-30 NOTE — Clinical Social Work Note (Signed)
Patient not eligible for [facility] per staff member Glenard Haring due to taking tramadol for pain. CSW spoke with MD and patient regarding restriction on tramadol for treatment, patient reported that she does not feel that she can manage without the medication to treat her chronic pain issues. Patient became tearful during conversation, CSW provided support. CSW also left message for Melissa at [facility] inquiring about patient's eligibility for program due to being prescribed tramadol. Patient expressed interest in returning home with her daughter to follow up with outpatient treatment.   [name], MSW, [place] Worker [facility] [phone]

## 2015-04-30 NOTE — Progress Notes (Signed)
  [facility] Adult Case Management Discharge Plan :  Will you be returning to the same living situation after discharge:  Yes,  patient plans to return home At discharge, do you have transportation home?: Yes,  patient reports that her daughter will provide transportation Do you have the ability to pay for your medications: Yes,  patient will be provided with medication samples and prescriptions at discharge  Release of information consent forms completed and in the chart;  Patient's signature needed at discharge.  Patient to Follow up at: Follow-up Information    Follow up with [facility] On 05/03/2015.   Why:  Walk-in appointment on Thursday May 12th between 8 am to 10:30 am for assessment for therapy and medication management services. Inquire about Intensive Outpatient Therapy (IOP) services.   Contact information:   [address], [place], [place] [address]  [phone] Fax: [phone]       Patient denies SI/HI: Yes,  denies    Safety Planning and Suicide Prevention discussed: Yes,  with patient and daughter  Have you used any form of tobacco in the last 30 days? (Cigarettes, Smokeless Tobacco, Cigars, and/or Pipes): Yes  Has patient been referred to the Quitline?: Yes, faxed on 04/30/15  Jacqueline [name], [name] 04/30/2015, 2:33 PM

## 2015-04-30 NOTE — [facility] Suicide Risk Assessment (Signed)
[facility] Discharge Suicide Risk Assessment   Demographic Factors:  Caucasian and Unemployed  Total Time spent with patient: 30 minutes  Musculoskeletal: Strength & Muscle Tone: within normal limits Gait & Station: affected by gout Patient leans: N/A  Psychiatric Specialty Exam: Physical Exam  Review of Systems  Constitutional: Negative.   Eyes: Negative.   Respiratory: Negative.   Cardiovascular: Negative.   Gastrointestinal: Positive for abdominal pain.  Musculoskeletal: Positive for back pain and joint pain.       Gout  Skin: Negative.   Neurological: Positive for headaches.  Endo/Heme/Allergies: Negative.   Psychiatric/Behavioral: Positive for depression and substance abuse. The patient is nervous/anxious.     Blood pressure 103/65, pulse 68, temperature 97.6 F (36.4 C), temperature source Oral, resp. rate 16, height 5' 1.25" (1.556 m), weight 51.256 kg (113 lb).Body mass index is 21.17 kg/(m^2).  General Appearance: Fairly Groomed  Engineer, water::  Fair  Speech:  Clear and [id number]  Volume:  Decreased  Mood:  Anxious and in pain  Affect:  worried  Thought Process:  Coherent and Goal Directed  Orientation:  Full (Time, Place, and Person)  Thought Content:  symptoms ( chronic pain) gout plans as she moves forward, relapse prevention plan  Suicidal Thoughts:  No  Homicidal Thoughts:  No  Memory:  Immediate;   Fair Recent;   Fair Remote;   Fair  Judgement:  Fair  Insight:  Present  Psychomotor Activity:  Normal  Concentration:  Fair  Recall:  [employer] [place]  Language: Fair  Akathisia:  No  Handed:  Right  AIMS (if indicated):     Assets:  Desire for Improvement Resilience Social Support  Sleep:  Number of Hours: 5.75  Cognition: WNL  ADL's:  Intact   Have you used any form of tobacco in the last 30 days? (Cigarettes, Smokeless Tobacco, Cigars, and/or Pipes): Yes  Has this patient used any form of tobacco in the last 30 days? (Cigarettes,  Smokeless Tobacco, Cigars, and/or Pipes) Yes, A prescription for an FDA-approved tobacco cessation medication was offered at discharge and the patient refused  Mental Status Per Nursing Assessment::   On Admission:  Suicidal ideation indicated by patient  Current Mental Status by Physician: In full contact with reality. There are no active S/S of withdrawal. She is dealing with her baseline chronic pain from her back and stomach. She is also having a flare up of her gout. She states she has no opioids left at the house. She is going to make an appointment with her PCP to pursue further pain management. She is going to be given a small prescription for Ultram to help her until she is able to see him   Loss Factors: Decline in physical health  Historical Factors: NA  Risk Reduction Factors:   Sense of responsibility to family, Living with another person, especially a relative and Positive social support  Continued Clinical Symptoms:  Depression:   Comorbid alcohol abuse/dependence Alcohol/Substance Abuse/Dependencies  Cognitive Features That Contribute To Risk:  Closed-mindedness, Polarized thinking and Thought constriction (tunnel vision)    Suicide Risk:  Minimal: No identifiable suicidal ideation.  Patients presenting with no risk factors but with morbid ruminations; may be classified as minimal risk based on the severity of the depressive symptoms  Principal Problem: Opioid type dependence, continuous Discharge Diagnoses:  Patient Active Problem List   Diagnosis Date Noted  . Substance induced mood disorder [F19.94] 04/24/2015  . Opioid type dependence, continuous [F11.20] 04/24/2015  .  Severe major depression without psychotic features [F32.2] 04/24/2015  . PTSD (post-traumatic stress disorder) [F43.10] 04/24/2015  . Chest pain, localized [R07.89] 04/28/2012  . Odynophagia [R13.10] 09/30/2011  . Colon cancer screening [Z12.11] 09/30/2011  . Gastric tumor [D37.1] 09/30/2011   . CLOSED FRACTURE OF UPPER END OF FIBULA [S82.839A] 07/31/2010  . DYSPHAGIA [R13.19] 07/17/2009  . ABDOMINAL PAIN, GENERALIZED [R10.84] 07/17/2009  . GASTRITIS [K29.70, K29.90] 07/12/2009  . IBS [K58.9] 07/12/2009  . RUQ PAIN [R10.11] 07/12/2009  . EPIGASTRIC PAIN [R10.13] 07/12/2009  . PANCREATITIS, ACUTE, HX OF [Z87.19] 07/12/2009  . Osteoarthrosis, unspecified whether generalized or localized, hand [M19.049] 11/09/2008  . OSTEOARTHRITIS, LOWER LEG [M17.10] 10/25/2008  . DERANGEMENT MENISCUS [M23.302] 10/16/2008  . KNEE PAIN [M25.569] 10/16/2008    Follow-up Information    Follow up with [facility] On 05/03/2015.   Why:  Walk-in appointment on Thursday May 12th between 8 am to 10:30 am for assessment for therapy and medication management services. Inquire about Intensive Outpatient Therapy (IOP) services.   Contact information:   [address], [place], [place] [address]  [phone] Fax: [phone]       Plan Of Care/Follow-up recommendations:  Activity:  as tolerated Diet:  regular Follow up [facility] as above Is patient on multiple antipsychotic therapies at discharge:  No   Has Patient had three or more failed trials of antipsychotic monotherapy by history:  No  Recommended Plan for Multiple Antipsychotic Therapies: NA    Orlene Salmons A 04/30/2015, 2:00 PM

## 2015-07-16 ENCOUNTER — Encounter: Payer: Self-pay | Admitting: Gastroenterology

## 2015-07-16 ENCOUNTER — Ambulatory Visit (INDEPENDENT_AMBULATORY_CARE_PROVIDER_SITE_OTHER): Payer: Medicaid Other | Admitting: Gastroenterology

## 2015-07-16 ENCOUNTER — Other Ambulatory Visit: Payer: Self-pay

## 2015-07-16 VITALS — BP 135/88 | HR 86 | Temp 97.8°F | Ht 62.0 in | Wt 114.0 lb

## 2015-07-16 DIAGNOSIS — R1314 Dysphagia, pharyngoesophageal phase: Secondary | ICD-10-CM | POA: Diagnosis not present

## 2015-07-16 DIAGNOSIS — R42 Dizziness and giddiness: Secondary | ICD-10-CM | POA: Diagnosis not present

## 2015-07-16 DIAGNOSIS — R1310 Dysphagia, unspecified: Secondary | ICD-10-CM

## 2015-07-16 DIAGNOSIS — R1013 Epigastric pain: Secondary | ICD-10-CM

## 2015-07-16 DIAGNOSIS — R1319 Other dysphagia: Secondary | ICD-10-CM

## 2015-07-16 NOTE — Progress Notes (Signed)
Primary Care Physician:  Neale Burly, MD  Primary Gastroenterologist:  Barney Drain, MD   Chief Complaint  Patient presents with  . Dysphagia    HPI:  Jacqueline [name] is a 64 y.o. female here for further evaluation of dysphagia. Last seen in May 2013. EGD in February 2013 showed a stricture in the distal esophagus, mild gastritis. Because of ongoing dysphagia and pain in the chest with swallowing she had an esophagram in August 2014 which was unremarkable. Barium tablet passed without delay.   C/O several months of difficulty swallowing pills and foods. Can't drink but sips at a time or "spews" liquid. Episode of solid food esophageal impaction but eventually resolved. Continues to have epigastric pain constant but worse with meals. Sometimes radiates into jaws. Omeprazole 20mg  bid. Some odynophagia. Living on smoothies. Rare solid foods. Lost down to 106 pounds after son suddenly passed two months ago. Has gained 6 pounds. Under a lot of stress. Hospitalized [facility] in 04/2015. Last cocaine use then. Occasional marijuana still.    BM every day 1-2 per day. No melena, brbpr.    Current Outpatient Prescriptions  Medication Sig Dispense Refill  . FLUoxetine (PROZAC) 20 MG capsule Take 1 capsule (20 mg total) by mouth daily. 30 capsule 0  . gabapentin (NEURONTIN) 300 MG capsule Take 2 capsules (600 mg total) by mouth 3 (three) times daily. 180 capsule 0  . methocarbamol (ROBAXIN) 750 MG tablet Take 1 tablet (750 mg total) by mouth 4 (four) times daily.    Marland Kitchen omeprazole (PRILOSEC) 20 MG capsule Take 20 mg by mouth 2 (two) times daily before a meal.    . traMADol (ULTRAM) 50 MG tablet Take 1 tablet (50 mg total) by mouth every 6 (six) hours as needed for moderate pain. 30 tablet 0  . traZODone (DESYREL) 50 MG tablet Take 1 tablet (50 mg total) by mouth at bedtime and may repeat dose one time if needed. 60 tablet 0   No current facility-administered medications for this visit.     Allergies as of 07/16/2015 - Review Complete 07/16/2015  Allergen Reaction Noted  . Aspirin Other (See Comments)   . Imitrex [sumatriptan base] Other (See Comments) 09/30/2011  . Ketorolac tromethamine Other (See Comments) 09/30/2011  . Nsaids Other (See Comments) 12/18/2011  . Promethazine hcl Other (See Comments) 12/18/2011  . Sulfonamide derivatives Nausea And Vomiting     Past Medical History  Diagnosis Date  . Gastric nodule 2009    EUS, ?leiomyoma  . Irritable bowel syndrome   . History of pancreatitis     Biliary and/or etoh?  Marland Kitchen Anxiety   . Chronic back pain   . GERD (gastroesophageal reflux disease)   . Migraines   . 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 Peripheral neuropathy   . Knee pain   . Gout   . Migraines   . Drug-seeking behavior   . Polysubstance abuse     opiates, cocaine, marijuana    Past Surgical History  Procedure Laterality Date  . Back surgery /ray cage fusion comberg, gso    . Toe graft    . Cesarean section    . 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  . Tonsillectomy    .  Abdominal hysterectomy    . Dilation and curettage of uterus    . Gallbladder surgery  2010  . Eus  12/2008    Dr. Paulita Fujita. Gastric nodule most consistent with Leiomyoma. Slightly thickened and irregular gallbladder wall, possibly due to sludge or diminutive stones. Recommend EUS in January 2011 to followup gastric nodule.  . Esophagogastroduodenoscopy  11/2008    A 9-mm submucosal lesion seen in the cardia., gastritis, no hpylori  . Colonoscopy  2001    Dr. Laural Golden, hemorrhoids  . Cholecystectomy    . Colonoscopy  10/21/2011    [id number] polyp multiple/internal hemorrhoids  . Savory dilation  02/03/2012     [id number]:[id number] in the distal esophagus/Mild gastritis  . Back surgery    . Abdominal surgery    . Foot surgery      Family History  Problem Relation Age of Onset  . Colon cancer Mother     diagnosed in late 35s and died age 68  . Heart failure Mother   . Heart defect      family history   . Arthritis      family history  . COPD      family history   . Cancer      multiple unknown type  . Heart attack Father     deceased at 37  . Hypertension Father   . Heart failure Father   . Lung cancer Maternal Uncle   . Throat cancer Maternal Uncle   . Colon cancer Paternal Uncle   . Colon cancer Maternal Aunt   . Colon cancer Other   . Anesthesia problems Neg Hx   . Hypotension Neg Hx   . Malignant hyperthermia Neg Hx   . Pseudochol deficiency Neg Hx     History   Social History  . Marital Status: Divorced    Spouse Name: N/A  . Number of Children: 2  . Years of Education: 10th grade   Occupational History  . disabled: back problems     Social History Main Topics  . Smoking status: Current Every Day Smoker -- 0.50 packs/day for 35 years    Types: Cigarettes  . Smokeless tobacco: Never Used  . Alcohol Use: Yes     Comment: occasional holiday drink... pt denied alcohol use on admission to [facility]  . Drug Use: Yes    Special: Marijuana, Cocaine     Comment: pt takes a "puff" of marijuana if she hasn't had anything for nausea-last use 2 months ago, denies any use today 06/18/2014  . Sexual Activity: Not on file   Other Topics Concern  . Not on file   Social History Narrative      ROS:  General: see hpi Eyes: Negative for vision changes.  ENT: Negative for hoarseness,  nasal congestion. CV: Negative for chest pain, angina, palpitations, dyspnea on exertion, peripheral edema.  Respiratory: Negative for dyspnea at rest, dyspnea on exertion, cough, sputum, wheezing.  GI: See history of present illness. GU:  Negative for dysuria, hematuria, urinary incontinence, urinary  frequency, nocturnal urination.  MS: Negative for joint pain. Chronic neck and back pain.  Derm: Negative for rash or itching.  Neuro: Negative for weakness, abnormal sensation, seizure, frequent headaches, memory loss, confusion.  Psych: Positive for anxiety, depression. No suicidal ideation, hallucinations.  Endo: see hpi.  Heme: Negative for bruising or bleeding. Allergy: Negative for rash or hives.    Physical Examination:  BP 135/88 mmHg  Pulse 86  Temp(Src) 97.8 F (36.6 C) (Oral)  Ht 5\' 2"  (1.575 m)  Wt 114 lb (51.71 kg)  BMI 20.85 kg/m2   General: Well-nourished, well-developed in no acute distress. Tearful. Head: Normocephalic, atraumatic.   Eyes: Conjunctiva pink, no icterus. Mouth: Oropharyngeal mucosa moist and pink , no lesions erythema or exudate. Edentulous. Neck: Supple without thyromegaly, masses, or lymphadenopathy.  Lungs: Clear to auscultation bilaterally.  Heart: Regular rate and rhythm, no murmurs rubs or gallops.  Abdomen: Bowel sounds are normal, moderate epigastric tenderness, nondistended, no hepatosplenomegaly or masses, no abdominal bruits or    hernia , no rebound or guarding.   Rectal: not performed Extremities: No lower extremity edema. No clubbing or deformities.  Neuro: Alert and oriented x 4 , grossly normal neurologically.  Skin: Warm and dry, no rash or jaundice.   Psych: Alert and cooperative, normal mood and affect.  Labs: Lab Results  Component Value Date   TSH 0.640 04/24/2015   Lab Results  Component Value Date   WBC 8.0 04/23/2015   HGB 11.6* 04/23/2015   HCT 35.9* 04/23/2015   MCV 94.0 04/23/2015   PLT 259 04/23/2015   Lab Results  Component Value Date   CREATININE 0.61 04/23/2015   BUN 12 04/23/2015   NA 138 04/23/2015   K 3.4* 04/23/2015   CL 104 04/23/2015   CO2 27 04/23/2015   Lab Results  Component Value Date   ALT 7* 04/23/2015   AST 13* 04/23/2015   ALKPHOS 65 04/23/2015   BILITOT 0.4 04/23/2015      Urine drug screen 04/2015 (cocaine positive)  Imaging Studies: No results found.

## 2015-07-16 NOTE — Assessment & Plan Note (Signed)
Recurrent esophageal dysphagia/odynophagia. Describes recent esophageal food impaction. Rarely consuming solid food at this point. Last EGD with dilation in 2013 for stricture in the distal esophagus. Recommend EGD with dilation with deep sedation given history of polypharmacy.  I have discussed the risks, alternatives, benefits with regards to but not limited to the risk of reaction to medication, bleeding, infection, perforation and the patient is agreeable to proceed. Written consent to be obtained.

## 2015-07-16 NOTE — Assessment & Plan Note (Signed)
Constant epigastric pain but worse with meals. History of multiple GI surgeries. History of polysubstance abuse although denies cocaine use in 2 months. Denies alcohol use. Check labs. EGD is planned.

## 2015-07-16 NOTE — Patient Instructions (Signed)
1. Please have your labs done today. 2. Upper endoscopy as scheduled.  3. Continue omeprazole 20mg  twice daily for now.

## 2015-07-16 NOTE — Assessment & Plan Note (Signed)
Check electrolytes. Blood pressure good today. Describes some orthostatic symptoms. Does not appear to be dehydrated on exam. Continue to monitor for now, consider PCP evaluation if labs are normal.

## 2015-07-17 LAB — COMPREHENSIVE METABOLIC PANEL
ALK PHOS: 74 U/L (ref 33–130)
ALT: 4 U/L — ABNORMAL LOW (ref 6–29)
AST: 14 U/L (ref 10–35)
Albumin: 4 g/dL (ref 3.6–5.1)
BUN: 7 mg/dL (ref 7–25)
CO2: 27 meq/L (ref 20–31)
Calcium: 9.4 mg/dL (ref 8.6–10.4)
Chloride: 103 mEq/L (ref 98–110)
Creat: 0.74 mg/dL (ref 0.50–0.99)
Glucose, Bld: 73 mg/dL (ref 65–99)
Potassium: 3.6 mEq/L (ref 3.5–5.3)
Sodium: 140 mEq/L (ref 135–146)
TOTAL PROTEIN: 6.5 g/dL (ref 6.1–8.1)
Total Bilirubin: 0.3 mg/dL (ref 0.2–1.2)

## 2015-07-17 LAB — CBC WITH DIFFERENTIAL/PLATELET
Basophils Absolute: 0 10*3/uL (ref 0.0–0.1)
Basophils Relative: 0 % (ref 0–1)
EOS ABS: 0.2 10*3/uL (ref 0.0–0.7)
EOS PCT: 2 % (ref 0–5)
HCT: 37.8 % (ref 36.0–46.0)
HEMOGLOBIN: 12.6 g/dL (ref 12.0–15.0)
LYMPHS ABS: 2.7 10*3/uL (ref 0.7–4.0)
Lymphocytes Relative: 30 % (ref 12–46)
MCH: 30.8 pg (ref 26.0–34.0)
MCHC: 33.3 g/dL (ref 30.0–36.0)
MCV: 92.4 fL (ref 78.0–100.0)
MONO ABS: 0.5 10*3/uL (ref 0.1–1.0)
MPV: 9.1 fL (ref 8.6–12.4)
Monocytes Relative: 5 % (ref 3–12)
Neutro Abs: 5.7 10*3/uL (ref 1.7–7.7)
Neutrophils Relative %: 63 % (ref 43–77)
PLATELETS: 295 10*3/uL (ref 150–400)
RBC: 4.09 MIL/uL (ref 3.87–5.11)
RDW: 13.2 % (ref 11.5–15.5)
WBC: 9 10*3/uL (ref 4.0–10.5)

## 2015-07-17 LAB — LIPASE: LIPASE: 37 U/L (ref 0–75)

## 2015-07-18 NOTE — Progress Notes (Signed)
Quick Note:  Labs normal. Please see PCP if ongoing dizziness. EGD as planned. ______

## 2015-07-19 NOTE — Progress Notes (Signed)
Quick Note:  Pt is aware. ______ 

## 2015-07-23 NOTE — Patient Instructions (Signed)
Jacqueline [name]  07/23/2015     @PREFPERIOPPHARMACY @   Your procedure is scheduled on 07/31/2015   Report to [facility] at  700  A.M.  Call this number if you have problems the morning of surgery:  [phone]   Remember:  Do not eat food or drink liquids after midnight.  Take these medicines the morning of surgery with A SIP OF WATER  Prozac, neurontin, robaxin, prilosec, ultram.   Do not wear jewelry, make-up or nail polish.  Do not wear lotions, powders, or perfumes.    Do not shave 48 hours prior to surgery.  Men may shave face and neck.  Do not bring valuables to the hospital.  [facility] is not responsible for any belongings or valuables.  Contacts, dentures or bridgework may not be worn into surgery.  Leave your suitcase in the car.  After surgery it may be brought to your room.  For patients admitted to the hospital, discharge time will be determined by your treatment team.  Patients discharged the day of surgery will not be allowed to drive home.   Name and phone number of your driver:   family Special instructions:  Follow the diet instructions given to you by Dr Nona Dell office.  Please read over the following fact sheets that you were given. Pain Booklet, Coughing and Deep Breathing, Surgical Site Infection Prevention, Anesthesia Post-op Instructions and Care and Recovery After Surgery      Esophagogastroduodenoscopy Esophagogastroduodenoscopy (EGD) is a procedure to examine the lining of the esophagus, stomach, and first part of the small intestine (duodenum). A long, flexible, lighted tube with a camera attached (endoscope) is inserted down the throat to view these organs. This procedure is done to detect problems or abnormalities, such as inflammation, bleeding, ulcers, or growths, in order to treat them. The procedure lasts about 5-20 minutes. It is usually an outpatient procedure, but it may need to be performed in emergency cases in the  hospital. LET YOUR CAREGIVER KNOW ABOUT:   Allergies to food or medicine.  All medicines you are taking, including vitamins, herbs, eyedrops, and over-the-counter medicines and creams.  Use of steroids (by mouth or creams).  Previous problems you or members of your family have had with the use of anesthetics.  Any blood disorders you have.  Previous surgeries you have had.  Other health problems you have.  Possibility of pregnancy, if this applies. RISKS AND COMPLICATIONS  Generally, EGD is a safe procedure. However, as with any procedure, complications can occur. Possible complications include:  Infection.  Bleeding.  Tearing (perforation) of the esophagus, stomach, or duodenum.  Difficulty breathing or not being able to breath.  Excessive sweating.  Spasms of the larynx.  Slowed heartbeat.  Low blood pressure. BEFORE THE PROCEDURE  Do not eat or drink anything for 6-8 hours before the procedure or as directed by your caregiver.  Ask your caregiver about changing or stopping your regular medicines.  If you wear dentures, be prepared to remove them before the procedure.  Arrange for someone to drive you home after the procedure. PROCEDURE   A vein will be accessed to give medicines and fluids. A medicine to relax you (sedative) and a pain reliever will be given through that access into the vein.  A numbing medicine (local anesthetic) may be sprayed on your throat for comfort and to stop you from gagging or coughing.  A mouth guard may be placed in your  mouth to protect your teeth and to keep you from biting on the endoscope.  You will be asked to lie on your left side.  The endoscope is inserted down your throat and into the esophagus, stomach, and duodenum.  Air is put through the endoscope to allow your caregiver to view the lining of your esophagus clearly.  The esophagus, stomach, and duodenum is then examined. During the exam, your caregiver  may:  Remove tissue to be examined under a microscope (biopsy) for inflammation, infection, or other medical problems.  Remove growths.  Remove objects (foreign bodies) that are stuck.  Treat any bleeding with medicines or other devices that stop tissues from bleeding (hot cautery, clipping devices).  Widen (dilate) or stretch narrowed areas of the esophagus and stomach.  The endoscope will then be withdrawn. AFTER THE PROCEDURE  You will be taken to a recovery area to be monitored. You will be able to go home once you are stable and alert.  Do not eat or drink anything until the local anesthetic and numbing medicines have worn off. You may choke.  It is normal to feel bloated, have pain with swallowing, or have a sore throat for a short time. This will wear off.  Your caregiver should be able to discuss his or her findings with you. It will take longer to discuss the test results if any biopsies were taken. Document Released: 04/10/2005 Document Revised: 04/24/2014 Document Reviewed: 11/10/2012 [facility] Patient Information 2015 [place], [place]. This information is not intended to replace advice given to you by your health care provider. Make sure you discuss any questions you have with your health care provider. Esophageal Dilatation The esophagus is the long, narrow tube which carries food and liquid from the mouth to the stomach. Esophageal dilatation is the technique used to stretch a blocked or narrowed portion of the esophagus. This procedure is used when a part of the esophagus has become so narrow that it becomes difficult, painful or even impossible to swallow. This is generally an uncomplicated form of treatment. When this is not successful, chest surgery may be required. This is a much more extensive form of treatment with a longer recovery time. CAUSES  Some of the more common causes of blockage or strictures of the esophagus are:  Narrowing from longstanding inflammation  (soreness and redness) of the lower esophagus. This comes from the constant exposure of the lower esophagus to the acid which bubbles up from the stomach. Over time this causes scarring and narrowing of the lower esophagus.  Hiatal hernia in which a small part of the stomach bulges (herniates) up through the diaphragm. This can cause a gradual narrowing of the end of the esophagus.  Schatzki ring is a narrow ring of benign (non-cancerous) fibrous tissue which constricts the lower esophagus. The reason for this is not known.  Scleroderma is a connective tissue disorder that affects the esophagus and makes swallowing difficult.  Achalasia is an absence of nerves to the lower esophagus and to the esophageal sphincter. This is the circular muscle between the stomach and esophagus that relaxes to allow food into the stomach. After swallowing, it contracts to keep food in the stomach. This absence of nerves may be congenital (present since birth). This can cause irregular spasms of the lower esophageal muscle. This spasm does not open up to allow food and fluid through. The result is a persistent blockage with subsequent slow trickling of the esophageal contents into the stomach.  Strictures may develop  from swallowing materials which damage the esophagus. Some examples are strong acids or alkalis such as lye.  Growths such as benign (non-cancerous) and malignant (cancerous) tumors can block the esophagus.  Hereditary (present since birth) causes. DIAGNOSIS  Your caregiver often suspects this problem by taking a medical history. They will also do a physical exam. They can then prove their suspicions using X-rays and endoscopy. Endoscopy is an exam in which a tube like a small, flexible telescope is used to look at your esophagus.  TREATMENT There are different stretching (dilating) techniques that can be used. Simple bougie dilatation may be done in the office. This usually takes only a couple minutes. A  numbing (anesthetic) spray of the throat is used. Endoscopy, when done, is done in an endoscopy suite under mild sedation. When fluoroscopy is used, the procedure is performed in X-ray. Other techniques require a little longer time. Recovery is usually quick. There is no waiting time to begin eating and drinking to test success of the treatment. Following are some of the methods used. Narrowing of the esophagus is treated by making it bigger. Commonly this is a mechanical problem which can be treated with stretching. This can be done in different ways. Your caregiver will discuss these with you. Some of the means used are:  A series of graduated (increasing thickness) flexible dilators can be used. These are weighted tubes passed through the esophagus into the stomach. The tubes used become progressively larger until the desired stretched size is reached. Graduated dilators are a simple and quick way of opening the esophagus. No visualization is required.  Another method is the use of endoscopy to place a flexible wire across the stricture. The endoscope is removed and the wire left in place. A dilator with a hole through it from end to end is guided down the esophagus and across the stricture. One or more of these dilators are passed over the wire. At the end of the exam, the wire is removed. This type of treatment may be performed in the X-ray department under fluoroscopy. An advantage of this procedure is the examiner is visualizing the end opening in the esophagus.  Stretching of the esophagus may be done using balloons. Deflated balloons are placed through the endoscope and across the stricture. This type of balloon dilatation is often done at the time of endoscopy or fluoroscopy. Flexible endoscopy allows the examiner to directly view the stricture. A balloon is inserted in the deflated form into the area of narrowing. It is then inflated with air to a certain pressure that is preset for a given  circumference. When inflated, it becomes sausage shaped, stretched, and makes the stricture larger.  Achalasia requires a longer, larger balloon-type dilator. This is frequently done under X-ray control. In this situation, the spastic muscle fibers in the lower esophagus are stretched. All of the above procedures make the passage of food and water into the stomach easier. They also make it easier for stomach contents to reflux back into the esophagus. Special medications may be used following the procedure to help prevent further stricturing. Proton-pump inhibitor medications are good at decreasing the amount of acid in the stomach juice. When stomach juice refluxes into the esophagus, the juice is no longer as acidic and is less likely to burn or scar the esophagus. RISKS AND COMPLICATIONS Esophageal dilatation is usually performed effectively and without problems. Some complications that can occur are:  A small amount of bleeding almost always happens where the stretching  takes place. If this is too excessive it may require more aggressive treatment.  An uncommon complication is perforation (making a hole) of the esophagus. The esophagus is thin. It is easy to make a hole in it. If this happens, an operation may be necessary to repair this.  A small, undetected perforation could lead to an infection in the chest. This can be very serious. HOME CARE INSTRUCTIONS   If you received sedation for your procedure, do not drive, make important decisions, or perform any activities requiring your full coordination. Do not drink alcohol, take sedatives, or use any mind altering chemicals unless instructed by your caregiver.  You may use throat lozenges or warm salt water gargles if you have throat discomfort.  You can begin eating and drinking normally on return home unless instructed otherwise. Do not purposely try to force large chunks of food down to test the benefits of your procedure.  Mild  discomfort can be eased with sips of ice water.  Medications for discomfort may or may not be needed. SEEK IMMEDIATE MEDICAL CARE IF:   You begin vomiting up blood.  You develop black, tarry stools.  You develop chills or an unexplained temperature of over 101F (38.3C)  You develop chest or abdominal pain.  You develop shortness of breath, or feel light-headed or faint.  Your swallowing is becoming more painful, difficult, or you are unable to swallow. MAKE SURE YOU:   Understand these instructions.  Will watch your condition.  Will get help right away if you are not doing well or get worse. Document Released: 01/29/2006 Document Revised: 04/24/2014 Document Reviewed: 03/18/2006 [facility] Patient Information 2015 [place], [place]. This information is not intended to replace advice given to you by your health care provider. Make sure you discuss any questions you have with your health care provider. PATIENT INSTRUCTIONS POST-ANESTHESIA  IMMEDIATELY FOLLOWING SURGERY:  Do not drive or operate machinery for the first twenty four hours after surgery.  Do not make any important decisions for twenty four hours after surgery or while taking narcotic pain medications or sedatives.  If you develop intractable nausea and vomiting or a severe headache please notify your doctor immediately.  FOLLOW-UP:  Please make an appointment with your surgeon as instructed. You do not need to follow up with anesthesia unless specifically instructed to do so.  WOUND CARE INSTRUCTIONS (if applicable):  Keep a dry clean dressing on the anesthesia/puncture wound site if there is drainage.  Once the wound has quit draining you may leave it open to air.  Generally you should leave the bandage intact for twenty four hours unless there is drainage.  If the epidural site drains for more than 36-48 hours please call the anesthesia department.  QUESTIONS?:  Please feel free to call your physician or the hospital  operator if you have any questions, and they will be happy to assist you.

## 2015-07-24 NOTE — Progress Notes (Signed)
CC'ED TO PCP 

## 2015-07-26 ENCOUNTER — Other Ambulatory Visit: Payer: Self-pay

## 2015-07-26 ENCOUNTER — Encounter (HOSPITAL_COMMUNITY): Payer: Self-pay

## 2015-07-26 ENCOUNTER — Encounter (HOSPITAL_COMMUNITY)
Admission: RE | Admit: 2015-07-26 | Discharge: 2015-07-26 | Disposition: A | Discharge status: Home / Self Care | Payer: Medicaid Other | Source: Ambulatory Visit | Attending: Gastroenterology | Admitting: Gastroenterology

## 2015-07-26 DIAGNOSIS — R1310 Dysphagia, unspecified: Secondary | ICD-10-CM | POA: Diagnosis not present

## 2015-07-26 DIAGNOSIS — Z01818 Encounter for other preprocedural examination: Secondary | ICD-10-CM | POA: Diagnosis not present

## 2015-07-26 NOTE — Pre-Procedure Instructions (Signed)
Pt. Given info for MyChart. To be set up at home. 

## 2015-07-31 ENCOUNTER — Encounter (HOSPITAL_COMMUNITY): Payer: Self-pay | Admitting: *Deleted

## 2015-07-31 ENCOUNTER — Ambulatory Visit (HOSPITAL_COMMUNITY)
Admission: RE | Admit: 2015-07-31 | Discharge: 2015-07-31 | Disposition: A | Discharge status: Home / Self Care | Payer: Medicaid Other | Source: Ambulatory Visit | Attending: Gastroenterology | Admitting: Gastroenterology

## 2015-07-31 ENCOUNTER — Telehealth: Payer: Self-pay

## 2015-07-31 ENCOUNTER — Ambulatory Visit (HOSPITAL_COMMUNITY): Payer: Medicaid Other | Admitting: Anesthesiology

## 2015-07-31 ENCOUNTER — Encounter (HOSPITAL_COMMUNITY)
Admission: RE | Disposition: A | Discharge status: Home / Self Care | Payer: Self-pay | Source: Ambulatory Visit | Attending: Gastroenterology

## 2015-07-31 ENCOUNTER — Other Ambulatory Visit: Payer: Self-pay

## 2015-07-31 DIAGNOSIS — K259 Gastric ulcer, unspecified as acute or chronic, without hemorrhage or perforation: Secondary | ICD-10-CM | POA: Diagnosis not present

## 2015-07-31 DIAGNOSIS — R1310 Dysphagia, unspecified: Secondary | ICD-10-CM | POA: Insufficient documentation

## 2015-07-31 DIAGNOSIS — K2960 Other gastritis without bleeding: Secondary | ICD-10-CM | POA: Diagnosis not present

## 2015-07-31 DIAGNOSIS — Z79899 Other long term (current) drug therapy: Secondary | ICD-10-CM | POA: Insufficient documentation

## 2015-07-31 DIAGNOSIS — Z800 Family history of malignant neoplasm of digestive organs: Secondary | ICD-10-CM | POA: Diagnosis not present

## 2015-07-31 DIAGNOSIS — K219 Gastro-esophageal reflux disease without esophagitis: Secondary | ICD-10-CM | POA: Diagnosis not present

## 2015-07-31 DIAGNOSIS — F17210 Nicotine dependence, cigarettes, uncomplicated: Secondary | ICD-10-CM | POA: Diagnosis not present

## 2015-07-31 DIAGNOSIS — R634 Abnormal weight loss: Secondary | ICD-10-CM | POA: Insufficient documentation

## 2015-07-31 DIAGNOSIS — F419 Anxiety disorder, unspecified: Secondary | ICD-10-CM | POA: Insufficient documentation

## 2015-07-31 DIAGNOSIS — K2970 Gastritis, unspecified, without bleeding: Secondary | ICD-10-CM

## 2015-07-31 DIAGNOSIS — R1314 Dysphagia, pharyngoesophageal phase: Secondary | ICD-10-CM

## 2015-07-31 HISTORY — PX: ESOPHAGOGASTRODUODENOSCOPY (EGD) WITH PROPOFOL: SHX5813

## 2015-07-31 HISTORY — PX: BIOPSY: SHX5522

## 2015-07-31 HISTORY — PX: ESOPHAGEAL DILATION: SHX303

## 2015-07-31 SURGERY — ESOPHAGOGASTRODUODENOSCOPY (EGD) WITH PROPOFOL
Anesthesia: Monitor Anesthesia Care

## 2015-07-31 MED ORDER — MINERAL OIL PO OIL
TOPICAL_OIL | ORAL | Status: AC
  Filled 2015-07-31: qty 30

## 2015-07-31 MED ORDER — ONDANSETRON HCL 4 MG/2ML IJ SOLN
4.0000 mg | Freq: Once | INTRAMUSCULAR | Status: AC
  Administered 2015-07-31: 4 mg via INTRAVENOUS

## 2015-07-31 MED ORDER — PROPOFOL INFUSION 10 MG/ML OPTIME
INTRAVENOUS | Status: DC | PRN
  Administered 2015-07-31: 125 ug/kg/min via INTRAVENOUS

## 2015-07-31 MED ORDER — LIDOCAINE VISCOUS 2 % MT SOLN
15.0000 mL | Freq: Once | OROMUCOSAL | Status: AC
  Administered 2015-07-31: 15 mL via OROMUCOSAL

## 2015-07-31 MED ORDER — ONDANSETRON HCL 4 MG/2ML IJ SOLN
INTRAMUSCULAR | Status: AC
  Filled 2015-07-31: qty 2

## 2015-07-31 MED ORDER — LIDOCAINE VISCOUS 2 % MT SOLN
OROMUCOSAL | Status: AC
  Filled 2015-07-31: qty 15

## 2015-07-31 MED ORDER — MIDAZOLAM HCL 2 MG/2ML IJ SOLN
INTRAMUSCULAR | Status: AC
  Filled 2015-07-31: qty 4

## 2015-07-31 MED ORDER — LACTATED RINGERS IV SOLN
INTRAVENOUS | Status: DC
  Administered 2015-07-31: 08:00:00 via INTRAVENOUS

## 2015-07-31 MED ORDER — STERILE WATER FOR IRRIGATION IR SOLN
Status: DC | PRN
  Administered 2015-07-31: 1000 mL

## 2015-07-31 MED ORDER — MIDAZOLAM HCL 2 MG/2ML IJ SOLN
INTRAMUSCULAR | Status: AC
  Filled 2015-07-31: qty 2

## 2015-07-31 MED ORDER — PROPOFOL 10 MG/ML IV BOLUS
INTRAVENOUS | Status: AC
  Filled 2015-07-31: qty 20

## 2015-07-31 MED ORDER — FENTANYL CITRATE (PF) 100 MCG/2ML IJ SOLN
25.0000 ug | INTRAMUSCULAR | Status: AC
  Administered 2015-07-31 (×2): 25 ug via INTRAVENOUS

## 2015-07-31 MED ORDER — FENTANYL CITRATE (PF) 100 MCG/2ML IJ SOLN: 25.0000 ug | INTRAMUSCULAR | Status: DC | PRN

## 2015-07-31 MED ORDER — LIDOCAINE HCL (CARDIAC) 10 MG/ML IV SOLN
INTRAVENOUS | Status: DC | PRN
  Administered 2015-07-31: 50 mg via INTRAVENOUS

## 2015-07-31 MED ORDER — EPHEDRINE SULFATE 50 MG/ML IJ SOLN
INTRAMUSCULAR | Status: AC
  Filled 2015-07-31: qty 1

## 2015-07-31 MED ORDER — MIDAZOLAM HCL 2 MG/2ML IJ SOLN
1.0000 mg | INTRAMUSCULAR | Status: DC | PRN
  Administered 2015-07-31: 2 mg via INTRAVENOUS

## 2015-07-31 MED ORDER — SODIUM CHLORIDE 0.9 % IJ SOLN
INTRAMUSCULAR | Status: AC
  Filled 2015-07-31: qty 10

## 2015-07-31 MED ORDER — MIDAZOLAM HCL 5 MG/5ML IJ SOLN
INTRAMUSCULAR | Status: DC | PRN
  Administered 2015-07-31: 2 mg via INTRAVENOUS

## 2015-07-31 MED ORDER — ONDANSETRON HCL 4 MG/2ML IJ SOLN: 4.0000 mg | Freq: Once | INTRAMUSCULAR | Status: DC | PRN

## 2015-07-31 MED ORDER — LIDOCAINE HCL (PF) 1 % IJ SOLN
INTRAMUSCULAR | Status: AC
  Filled 2015-07-31: qty 5

## 2015-07-31 MED ORDER — SUCCINYLCHOLINE CHLORIDE 20 MG/ML IJ SOLN
INTRAMUSCULAR | Status: AC
  Filled 2015-07-31: qty 1

## 2015-07-31 MED ORDER — FENTANYL CITRATE (PF) 100 MCG/2ML IJ SOLN
INTRAMUSCULAR | Status: AC
Start: 2015-07-31 — End: 2015-07-31
  Filled 2015-07-31: qty 2

## 2015-07-31 MED ORDER — CELECOXIB 200 MG PO CAPS: 200.0000 mg | ORAL_CAPSULE | Freq: Two times a day (BID) | ORAL | Status: DC

## 2015-07-31 SURGICAL SUPPLY — 9 items
BLOCK BITE 60FR ADLT L/F BLUE (MISCELLANEOUS) ×1 IMPLANT
FLOOR PAD 36X40 (MISCELLANEOUS) ×3
FORCEPS BIOP RAD 4 LRG CAP 4 (CUTTING FORCEPS) ×1 IMPLANT
FORMALIN 10 PREFIL 20ML (MISCELLANEOUS) ×4 IMPLANT
KIT ENDO PROCEDURE PEN (KITS) ×3 IMPLANT
MANIFOLD NEPTUNE II (INSTRUMENTS) ×3 IMPLANT
PAD FLOOR 36X40 (MISCELLANEOUS) IMPLANT
TUBING IRRIGATION ENDOGATOR (MISCELLANEOUS) ×1 IMPLANT
WATER STERILE IRR 1000ML POUR (IV SOLUTION) ×1 IMPLANT

## 2015-07-31 NOTE — Progress Notes (Signed)
Referral done. See phone note from 07/31/2015

## 2015-07-31 NOTE — Anesthesia Procedure Notes (Signed)
Procedure Name: MAC Date/Time: 07/31/2015 8:33 AM Performed by: Andree Elk, Chen Saadeh A Pre-anesthesia Checklist: Patient identified, Timeout performed, Emergency Drugs available, Suction available and Patient being monitored Oxygen Delivery Method: Simple face mask

## 2015-07-31 NOTE — H&P (Signed)
Primary Care Physician:  Neale Burly, MD Primary Gastroenterologist:  Dr. Oneida Alar  Pre-Procedure History & Physical: HPI:  Jacqueline [name] is a 64 y.o. female here for Lee.  Past Medical History  Diagnosis Date  . Gastric nodule 2009    EUS, ?leiomyoma  . Irritable bowel syndrome   . History of pancreatitis     Biliary and/or etoh?  Marland Kitchen Anxiety   . Chronic back pain   . GERD (gastroesophageal reflux disease)   . Migraines   . 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 Peripheral neuropathy   . Knee pain   . Gout   . Migraines   . Drug-seeking behavior   . Polysubstance abuse     opiates, cocaine, marijuana    Past Surgical History  Procedure Laterality Date  . Back surgery /ray cage fusion comberg, gso    . Toe graft    . Cesarean section    . 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  . Tonsillectomy    . Abdominal hysterectomy    . Dilation and curettage of uterus    . Gallbladder surgery  2010  . Eus  12/2008    Dr. Paulita Fujita. Gastric nodule most consistent with Leiomyoma. Slightly thickened and irregular gallbladder wall, possibly due to sludge or diminutive stones. Recommend EUS in January 2011 to followup gastric nodule.  . Esophagogastroduodenoscopy  11/2008    A 9-mm submucosal lesion seen in the cardia., gastritis, no hpylori  . Colonoscopy  2001    Dr. Laural Golden, hemorrhoids  . Cholecystectomy    . Colonoscopy  10/21/2011    PXT:GGYIRSW polyp multiple/internal hemorrhoids  . Savory dilation  02/03/2012    NIO:[id number] in the distal esophagus/Mild gastritis  . Back surgery    . Abdominal surgery    . Foot surgery      Prior to Admission medications   Medication Sig Start Date End Date Taking? Authorizing  Provider  acetaminophen (TYLENOL) 500 MG tablet Take 1,000 mg by mouth every 6 (six) hours as needed for mild pain.   Yes Historical Provider, MD  FLUoxetine (PROZAC) 20 MG capsule Take 1 capsule (20 mg total) by mouth daily. 04/30/15  Yes Niel Hummer, NP  gabapentin (NEURONTIN) 300 MG capsule Take 2 capsules (600 mg total) by mouth 3 (three) times daily. 04/30/15  Yes Niel Hummer, NP  ibuprofen (ADVIL,MOTRIN) 200 MG tablet Take 200 mg by mouth every 6 (six) hours as needed for moderate pain.   Yes Historical Provider, MD  methocarbamol (ROBAXIN) 750 MG tablet Take 1 tablet (750 mg total) by mouth 4 (four) times daily. 04/30/15  Yes Niel Hummer, NP  omeprazole (PRILOSEC) 20 MG capsule Take 20 mg by mouth 2 (two) times daily before a meal.   Yes Historical Provider, MD  traMADol (ULTRAM) 50 MG tablet Take 1 tablet (50 mg total) by mouth every 6 (six) hours as needed for moderate pain. 04/30/15  Yes Niel Hummer, NP  traZODone (DESYREL) 50 MG tablet Take 1 tablet (50 mg total) by mouth at bedtime and may repeat dose one time if needed. 04/30/15  Yes Niel Hummer, NP    Allergies as of 07/16/2015 - Review Complete 07/16/2015  Allergen Reaction Noted  . Aspirin Other (See Comments)   . Imitrex [sumatriptan base] Other (See Comments) 09/30/2011  . Ketorolac tromethamine Other (See Comments) 09/30/2011  . Nsaids Other (See Comments) 12/18/2011  . Promethazine hcl Other (See Comments) 12/18/2011  . Sulfonamide derivatives Nausea And Vomiting     Family History  Problem Relation Age of Onset  . Colon cancer Mother     diagnosed in late 23s and died age 46  . Heart failure Mother   . Heart defect      family history   . Arthritis      family history  . COPD      family history   . Cancer      multiple unknown type  . Heart attack Father     deceased at 65  . Hypertension Father   . Heart failure Father   . Lung cancer Maternal Uncle   . Throat cancer Maternal Uncle   . Colon cancer  Paternal Uncle   . Colon cancer Maternal Aunt   . Colon cancer Other   . Anesthesia problems Neg Hx   . Hypotension Neg Hx   . Malignant hyperthermia Neg Hx   . Pseudochol deficiency Neg Hx     History   Social History  . Marital Status: Divorced    Spouse Name: N/A  . Number of Children: 2  . Years of Education: 10th grade   Occupational History  . disabled: back problems     Social History Main Topics  . Smoking status: Current Every Day Smoker -- 0.50 packs/day for 35 years    Types: Cigarettes  . Smokeless tobacco: Never Used  . Alcohol Use: Yes     Comment: occasional holiday drink... pt denied alcohol use on admission to [facility]  . Drug Use: Yes    Special: Marijuana, Cocaine     Comment: pt takes a "puff" of marijuana if she hasn't had anything for nausea-last use 2 months ago, denies any use today 06/18/2014  . Sexual Activity: Not on file   Other Topics Concern  . Not on file   Social History Narrative    Review of Systems: See HPI, otherwise negative ROS   Physical Exam: Pulse 65  Temp(Src) 98.6 F (37 C) (Oral)  Resp 15  Ht 5\' 2"  (1.575 m)  Wt 114 lb (51.71 kg)  BMI 20.85 kg/m2  SpO2 97% General:   Alert,  pleasant and cooperative in NAD Head:  Normocephalic and atraumatic. Neck:  Supple; Lungs:  Clear throughout to auscultation.    Heart:  Regular rate and rhythm. Abdomen:  Soft, nontender and nondistended. Normal bowel sounds, without guarding, and without rebound.   Neurologic:  Alert and  oriented x4;  grossly normal neurologically.  Impression/Plan:    DYSPHAGIA  PLAN:  EGD/DIL TODAY

## 2015-07-31 NOTE — Telephone Encounter (Signed)
-----   Message from Danie Binder, MD sent at 07/31/2015  9:29 AM EDT ----- EGD W/ MAC-DX: gastric ulcer surveillance in NOV 2016

## 2015-07-31 NOTE — Anesthesia Preprocedure Evaluation (Signed)
Anesthesia Evaluation  Patient identified by MRN, date of birth, ID band Patient awake    Reviewed: Allergy & Precautions, H&P , NPO status , Patient's Chart, lab work & pertinent test results  History of Anesthesia Complications Negative for: history of anesthetic complications  Airway Mallampati: I       Dental  (+) Edentulous Upper, Teeth Intact   Pulmonary Current Smoker,    Pulmonary exam normal       Cardiovascular negative cardio ROS  Rhythm:Regular Rate:Normal     Neuro/Psych  Headaches, Anxiety  Neuromuscular disease    GI/Hepatic GERD-  Medicated and Controlled,  Endo/Other    Renal/GU      Musculoskeletal   Abdominal   Peds  Hematology   Anesthesia Other Findings   Reproductive/Obstetrics                             Anesthesia Physical Anesthesia Plan  ASA: III  Anesthesia Plan: MAC   Post-op Pain Management:    Induction: Intravenous  Airway Management Planned: Simple Face Mask  Additional Equipment:   Intra-op Plan:   Post-operative Plan:   Informed Consent: I have reviewed the patients History and Physical, chart, labs and discussed the procedure including the risks, benefits and alternatives for the proposed anesthesia with the patient or authorized representative who has indicated his/her understanding and acceptance.     Plan Discussed with:   Anesthesia Plan Comments:         Anesthesia Quick Evaluation

## 2015-07-31 NOTE — Progress Notes (Addendum)
REVIEWED. Egd/empiric dilation today. NEED REFERRAL TO [facility] GI DEPT FOR OPV DYSPHAGIA TO DISCUSS NEED FOR ESOPHAGEAL MANOMETRY/IMPEDANCE.

## 2015-07-31 NOTE — Anesthesia Postprocedure Evaluation (Signed)
  Anesthesia Post-op Note  Patient: Jacqueline [name]  Procedure(s) Performed: Procedure(s): ESOPHAGOGASTRODUODENOSCOPY (EGD) WITH PROPOFOL (N/A) ESOPHAGEAL DILATION WIRE GUIDED WITH SAVORY DILATORS 15MM, 16MM (N/A) BIOPSY (DUODENAL, GASTRIC, GASTRIC ULCER, ESOPHAGEAL)  Patient Location: PACU  Anesthesia Type:MAC  Level of Consciousness: awake, alert , oriented and patient cooperative  Airway and Oxygen Therapy: Patient Spontanous Breathing and Patient connected to face mask oxygen  Post-op Pain: none  Post-op Assessment: Post-op Vital signs reviewed, Patient's Cardiovascular Status Stable, Respiratory Function Stable, Patent Airway, No signs of Nausea or vomiting and Pain level controlled              Post-op Vital Signs: Reviewed and stable  Last Vitals:  Filed Vitals:   07/31/15 0830  BP: 123/85  Pulse:   Temp:   Resp: 12    Complications: No apparent anesthesia complications

## 2015-07-31 NOTE — Telephone Encounter (Signed)
Pt placed on recall list. Pt was referred to [place] has appt on 08/06/2015 with Dr. Dewaine Oats @ 2:45pm

## 2015-07-31 NOTE — Discharge Instructions (Signed)
I dilated your esophagus. I DID NOT SEE A DEFINITE NARROWING IN YOUR esophagus. You have aN ULCER AND gastritis DUE TO IBURPOFEN. I biopsied your ESOPHAGUS, stomach, & SMALL BOWEL.   FOLLOW A SOFT MECHANICAL DIET.  MEATS SHOULD BE CHOPPED OR GROUND ONLY. SEE INFO BELOW.  CONTINUE OMEPRAZOLE.  TAKE 30 MINUTES PRIOR TO YOUR MEALS TWICE DAILY.  STOP IBUPROFEN. USE CELEBREX TWICE DAILY AND ULTRAM FOR PAIN CONTROL.  SEE GI DOCTORS AT [facility] TO DISCUSS YOUR SWALLOWING PROBLEM AND TO DISCUSSE THE NEED FOR FOR ESOPHAGEAL PRESSURE TEST.  EAT 4 TO 6 MEALS A DAY.  DRINK 3-4 CANS OF ENSURE, BOOTS, OR CARNATION INSTANT BREAKFAST A DAY.  YOUR BIOPSY RESULTS WILL BE AVAILABLE IN MY CHART AFTER AUG 11 AND MY OFFICE WILL CONTACT YOU IN 10-14 DAYS WITH YOUR RESULTS.   FOLLOW UP IN OCT 2016.   REPEAT UPPER ENDOSCOPY IN NOV 2016. UPPER ENDOSCOPY AFTER CARE Read the instructions outlined below and refer to this sheet in the next week. These discharge instructions provide you with general information on caring for yourself after you leave the hospital. While your treatment has been planned according to the most current medical practices available, unavoidable complications occasionally occur. If you have any problems or questions after discharge, call DR. Seng Fouts, [phone].  ACTIVITY  You may resume your regular activity, but move at a slower pace for the next 24 hours.   Take frequent rest periods for the next 24 hours.   Walking will help get rid of the air and reduce the bloated feeling in your belly (abdomen).   No driving for 24 hours (because of the medicine (anesthesia) used during the test).   You may shower.   Do not sign any important legal documents or operate any machinery for 24 hours (because of the anesthesia used during the test).    NUTRITION  Drink plenty of fluids.   You may resume your normal diet as instructed by your doctor.   Begin with a light meal and progress to  your normal diet. Heavy or fried foods are harder to digest and may make you feel sick to your stomach (nauseated).   Avoid alcoholic beverages for 24 hours or as instructed.    MEDICATIONS  You may resume your normal medications.   WHAT YOU CAN EXPECT TODAY  Some feelings of bloating in the abdomen.   Passage of more gas than usual.    IF YOU HAD A BIOPSY TAKEN DURING THE UPPER ENDOSCOPY:  Eat a soft diet IF YOU HAVE NAUSEA, BLOATING, ABDOMINAL PAIN, OR VOMITING.    FINDING OUT THE RESULTS OF YOUR TEST Not all test results are available during your visit. DR. Oneida Alar WILL CALL YOU WITHIN 14 DAYS OF YOUR PROCEDUE WITH YOUR RESULTS. Do not assume everything is normal if you have not heard from DR. Willaim Mode, CALL HER OFFICE AT [phone].  SEEK IMMEDIATE MEDICAL ATTENTION AND CALL THE OFFICE: [phone] IF:  You have more than a spotting of blood in your stool.   Your belly is swollen (abdominal distention).   You are nauseated or vomiting.   You have a temperature over 101F.   You have abdominal pain or discomfort that is severe or gets worse throughout the day.  Gastritis  Gastritis is an inflammation (the body's way of reacting to injury and/or infection) of the stomach. It is often caused by viral or bacterial (germ) infections. It can also be caused BY ASPIRIN, BC/GOODY POWDER'S, (IBUPROFEN) MOTRIN, OR ALEVE (  NAPROXEN), chemicals (including alcohol), SPICY FOODS, and medications. This illness may be associated with generalized malaise (feeling tired, not well), UPPER ABDOMINAL STOMACH cramps, and fever. One common bacterial cause of gastritis is an organism known as H. Pylori. This can be treated with antibiotics.   REFLUX  SYMPTOMS Common symptoms of GERD are heartburn (burning in your chest). This is worse when lying down or bending over. It may also cause belching and indigestion. Some of the things which make GERD worse are:  Increased weight pushes on stomach  making acid rise more easily.   Smoking markedly increases acid production.   Alcohol decreases lower esophageal sphincter pressure (valve between stomach and esophagus), allowing acid from stomach into esophagus.   Late evening meals and going to bed with a full stomach increases pressure.   HOME CARE INSTRUCTIONS  Try to achieve and maintain an ideal body weight.   Avoid drinking alcoholic beverages.   DO NOT smokE.   Do not wear tight clothing around your chest or stomach.   Eat smaller meals and eat more frequently. This keeps your stomach from getting too full. Eat slowly.   Do not lie down for 2 or 3 hours after eating. Do not eat or drink anything 1 to 2 hours before going to bed.   Avoid caffeine beverages (colas, coffee, cocoa, tea), fatty foods, citrus fruits and all other foods and drinks that contain acid and that seem to increase the problems.   Avoid bending over, especially after eating OR STRAINING. Anything that increases the pressure in your belly increases the amount of acid that may be pushed up into your esophagus.    DYSPHAGIA DYSPHAGIA can be caused by stomach acid backing up into the tube that carries food from the mouth down to the stomach (lower esophagus).  TREATMENT There are a number of  medicines used to treat DYSPHAGIA including: Antacids.  Proton-pump inhibitors: omeprazole  HOME CARE INSTRUCTIONS Eat 2-3 hours before going to bed.  Try to reach and maintain a healthy weight.  Do not eat just a few very large meals. Instead, eat 4 TO 6 smaller meals throughout the day.  Try to identify foods and beverages that make your symptoms worse, and avoid these.  Avoid tight clothing.  Do not exercise right after eating.  SOFT MECHANICAL DIET This SOFT MECHANICAL DIET is restricted to:  Foods that are moist, soft-textured, and easy to chew and swallow. MEATS SHOULD BE CHOPPED OR GROUND.  Meats that are ground or are minced no larger than  one-quarter inch pieces. Meats are moist with gravy or sauce added.   Foods that do not include bread or bread-like textures except soft pancakes, well-moistened with syrup or sauce.   Textures with some chewing ability required.   Casseroles without rice.   Cooked vegetables that are less than half an inch in size and easily mashed with a fork. No cooked corn, peas, broccoli, cauliflower, cabbage, Brussels sprouts, asparagus, or other fibrous, non-tender or rubbery cooked vegetables.   Canned fruit except for pineapple. Fruit must be cut into pieces no larger than half an inch in size.   Foods that do not include nuts, seeds, coconut, or sticky textures.    FOOD TEXTURES FOR DYSPHAGIA DIET LEVEL 2 -SOFT MECHANICAL DIET (includes all foods on Dysphagia Diet Level 1 - Pureed, in addition to the foods listed below)  FOOD GROUP: Breads. RECOMMENDED: Soft pancakes, well-moistened with syrup or sauce. AVOID: All others.  FOOD GROUP: Cereals. RECOMMENDED:  Cooked cereals with little texture, including oatmeal. Unprocessed wheat bran stirred into cereals for bulk. Note: If thin liquids are restricted, it is important that all of the liquid is absorbed into the cereal. AVOID: All dry cereals and any cooked cereals that may contain flax seeds or other seeds or nuts. Whole-grain, dry, or coarse cereals. Cereals with nuts, seeds, dried fruit, and/or coconut.  FOOD GROUP: Desserts. RECOMMENDED: Pudding, custard. Soft fruit pies with bottom crust only. Canned fruit (excluding pineapple). Soft, moist cakes with icing.Frozen malts, milk shakes, frozen yogurt, eggnog, nutritional supplements, ice cream, sherbet, regular or sugar-free gelatin, or any foods that become thin liquid at either room (70 F) or body temperature (98 F). AVOID: Dry, coarse cakes and cookies. Anything with nuts, seeds, coconut, pineapple, or dried fruit. Breakfast yogurt with nuts. Rice or bread pudding.  FOOD GROUP:  Fats. RECOMMENDED: Butter, margarine, cream for cereal (depending on liquid consistency recommendations), gravy, cream sauces, sour cream, sour cream dips with soft additives, mayonnaise, salad dressings, cream cheese, cream cheese spreads with soft additives, whipped toppings. AVOID: All fats with coarse or chunky additives.  FOOD GROUP: Fruits. RECOMMENDED: Soft drained, canned, or cooked fruits without seeds or skin. Fresh soft and ripe banana. Fruit juices with a small amount of pulp. If thin liquids are restricted, fruit juices should be thickened to appropriate consistency. AVOID: Fresh or frozen fruits. Cooked fruit with skin or seeds. Dried fruits. Fresh, canned, or cooked pineapple.  FOOD GROUP: Meats and Meat Substitutes. (Meat pieces should not exceed 1/4 of an inch cube and should be tender.) RECOMMENDED: Moistened ground or cooked meat, poultry, or fish. Moist ground or tender meat may be served with gravy or sauce. Casseroles without rice. Moist macaroni and cheese, well-cooked pasta with meat sauce, tuna noodle casserole, soft, moist lasagna. Moist meatballs, meatloaf, or fish loaf. Protein salads, such as tuna or egg without large chunks, celery, or onion. Cottage cheese, smooth quiche without large chunks. Poached, scrambled, or soft-cooked eggs (egg yolks should not be runny but should be moist and able to be mashed with butter, margarine, or other moisture added to them). (Cook eggs to 160 F or use pasteurized eggs for safety.) Souffls may have small, soft chunks. Tofu. Well-cooked, slightly mashed, moist legumes, such as baked beans. All meats or protein substitutes should be served with sauces or moistened to help maintain cohesiveness in the oral cavity. AVOID: Dry meats, tough meats (such as bacon, sausage, hot dogs, bratwurst). Dry casseroles or casseroles with rice or large chunks. Peanut butter. Cheese slices and cubes. Hard-cooked or crisp fried eggs.  Sandwiches.Pizza.  FOOD GROUP: Potatoes and Starches. RECOMMENDED: Well-cooked, moistened, boiled, baked, or mashed potatoes. Well-cooked shredded hash brown potatoes that are not crisp. (All potatoes need to be moist and in sauces.)Well-cooked noodles in sauce. Spaetzel or soft dumplings that have been moistened with butter or gravy. AVOID: Potato skins and chips. Fried or French-fried potatoes. Rice.  FOOD GROUP: Soups. RECOMMENDED: Soups with easy-to-chew or easy-to-swallow meats or vegetables: Particle sizes in soups should be less than 1/2 inch. Soups will need to be thickened to appropriate consistency if soup is thinner than prescribed liquid consistency. AVOID: Soups with large chunks of meat and vegetables. Soups with rice, corn, peas.  FOOD GROUP: Vegetables. RECOMMENDED: All soft, well-cooked vegetables. Vegetables should be less than a half inch. Should be easily mashed with a fork. AVOID: Cooked corn and peas. Broccoli, cabbage, Brussels sprouts, asparagus, or other fibrous, non-tender or rubbery cooked vegetables.  FOOD GROUP: Miscellaneous. RECOMMENDED: Jams and preserves without seeds, jelly. Sauces, salsas, etc., that may have small tender chunks less than 1/2 inch. Soft, smooth chocolate bars that are easily chewed. AVOID: Seeds, nuts, coconut, or sticky foods. Chewy candies such as caramels or licorice.

## 2015-07-31 NOTE — Op Note (Signed)
[facility] [address] [place], [address]   ENDOSCOPY PROCEDURE REPORT  PATIENT: Jacqueline, [name]  MR#: [id number] BIRTHDATE: 1951-07-31 , 63  yrs. old GENDER: female  ENDOSCOPIST: Danie Binder, MD REFFERED OE:HOZY Hasanaj, M.D. PROCEDURE DATE:  08/06/2015 PROCEDURE:   EGD with biopsy and EGD with dilatation over guidewire  INDICATIONS:1.  dysphagia.   2.  weight loss. MEDICATIONS: Monitored anesthesia care TOPICAL ANESTHETIC: Viscous Xylocaine  DESCRIPTION OF PROCEDURE:   After the risks benefits and alternatives of the procedure were thoroughly explained, informed consent was obtained.  The     endoscope was introduced through the mouth and advanced to the second portion of the duodenum. The instrument was slowly withdrawn as the mucosa was carefully examined.  Prior to withdrawal of the scope, the guidwire was placed.  The esophagus was dilated successfully.  The patient was recovered in endoscopy and discharged home in satisfactory condition. Estimated blood loss is zero unless otherwise noted in this procedure report.   ESOPHAGUS APPEARED NORMAL. COL DBIOPSIES 20 AND 34 CM FROM TEH TEETH. GE JUNCTION 39 CM FROM TEH TEETHH. EMPIRIC DILATION PERFORMED DUE TO PISSIBLE PROXIMAL ESOPHAGEAL WEB. STOMACH: A single non-bleeding irregular shaped and clean-based ulcer, ranging between 3-34mm in size, with surrounding edema was found on the greater curvature of the gastric body.  Biopsies were taken around the ulcer.   Moderate non-erosive gastritis (inflammation) was found in the gastric antrum and on the greater curvature of the gastric body.  Multiple biopsies were performed using cold forceps. DUODENUM: The duodenal mucosa showed no abnormalities in the bulb and second portion of the duodenum.  Cold forceps biopsies were taken in the bulb and second portion.   Dilation was then performed at the distal esophagus and proximal esophagus Dilator: Savary  over guidewire Size(s): 15-16 MM Heme: none COMPLICATIONS: There were no immediate complications.  ENDOSCOPIC IMPRESSION: 1.   GASTRIC ULCER, MODERATE GASTRITIS 2.   DYSPHAGIA-NO OBVIOUS SOURCE IDENTIFIED  RECOMMENDATIONS: SOFT MECHANICAL DIET. OMEPRAZOLE 30 MINUTES PRIOR TO MEALS TWICE DAILY. STOP IBUPROFEN.  CELEBREX TWICE DAILY AND ULTRAM PRN FOR PAIN CONTROL. REFER TO [facility] FOR DYSPHAGIA AND CONSIDER MANOMETRY/IMPEDANCE. EAT 4 TO 6 MEALS A DAY. ENSURE, BOOST, OR CIB QID. AWAIT BIOPSY RESULTS. FOLLOW UP IN OCT 2016. REPEAT UPPER ENDOSCOPY IN NOV 2016.  eSigned:  Danie Binder, MD Aug 06, 2015 9:28 AM  CPT CODES: ICD CODES:  The ICD and CPT codes recommended by this software are interpretations from the data that the clinical staff has captured with the software.  The verification of the translation of this report to the ICD and CPT codes and modifiers is the sole responsibility of the health care institution and practicing physician where this report was generated.  [employer] will not be held responsible for the validity of the ICD and CPT codes included on this report.  AMA assumes no liability for data contained or not contained herein. [employer] is a Designer, television/film set of the [employer].

## 2015-07-31 NOTE — Transfer of Care (Signed)
Immediate Anesthesia Transfer of Care Note  Patient: Jacqueline [name]  Procedure(s) Performed: Procedure(s): ESOPHAGOGASTRODUODENOSCOPY (EGD) WITH PROPOFOL (N/A) ESOPHAGEAL DILATION WIRE GUIDED WITH SAVORY DILATORS 15MM, 16MM (N/A) BIOPSY (DUODENAL, GASTRIC, GASTRIC ULCER, ESOPHAGEAL)  Patient Location: PACU  Anesthesia Type:MAC  Level of Consciousness: awake, alert , oriented and patient cooperative  Airway & Oxygen Therapy: Patient Spontanous Breathing and Patient connected to face mask oxygen  Post-op Assessment: Report given to RN and Post -op Vital signs reviewed and stable  Post vital signs: Reviewed and stable  Last Vitals:  Filed Vitals:   07/31/15 0830  BP: 123/85  Pulse:   Temp:   Resp: 12    Complications: No apparent anesthesia complications

## 2015-08-01 ENCOUNTER — Encounter (HOSPITAL_COMMUNITY): Payer: Self-pay | Admitting: Gastroenterology

## 2015-08-06 ENCOUNTER — Telehealth: Payer: Self-pay | Admitting: Gastroenterology

## 2015-08-06 NOTE — Telephone Encounter (Signed)
Please call pt. HER stomach Bx shows gastritis. THE ESOPHAGUS BIOPSIES ARE NORMAL.  HER SMALL BOWEL BIOPSIES ARE NORMAL.   FOLLOW A SOFT MECHANICAL DIET.  MEATS SHOULD BE CHOPPED OR GROUND ONLY.  CONTINUE OMEPRAZOLE.  TAKE 30 MINUTES PRIOR TO YOUR MEALS TWICE DAILY.  STOP IBUPROFEN. USE CELEBREX TWICE DAILY AND ULTRAM FOR PAIN CONTROL.  SEE GI DOCTORS AT [facility] TO DISCUSS YOUR SWALLOWING PROBLEM AND TO DISCUSSE THE NEED FOR FOR ESOPHAGEAL PRESSURE TEST.  EAT 4 TO 6 MEALS A DAY.  DRINK 3-4 CANS OF ENSURE, BOOTS, OR CARNATION INSTANT BREAKFAST A DAY.  FOLLOW UP IN OCT 2016 E30 DYSPHAGIA/PUD.   REPEAT UPPER ENDOSCOPY IN NOV 2016 TO MAKE SURE YOUR ULCERS HAVE HEALED.

## 2015-08-07 NOTE — Telephone Encounter (Signed)
APPOINTMENT MADE AND ON RECALL  °

## 2015-08-07 NOTE — Telephone Encounter (Signed)
Pt is aware of results. She will reschedule her appt at [facility], she has had some things going on with family.

## 2015-08-23 IMAGING — RF DG ESOPHAGUS
17 of 24 series · 17 of 24 positions shown · non-contrast
Comparison: 07/20/2009

CLINICAL DATA: Dysphagia, pain in center of chest, pain and
difficulty when swallowing solids liquids and pills, prior
esophageal dilatation x 2

ESOPHAGUS/BARIUM SWALLOW/TABLET STUDY:
TECHNIQUE: Single contrast, air contrast, and tablet imaging of
the esophagus were performed.
Fluoroscopy time:  2 minute 0 seconds

[Series 1: run · 1 of 1 slices shown (1 of 17)]
[im 1/1]
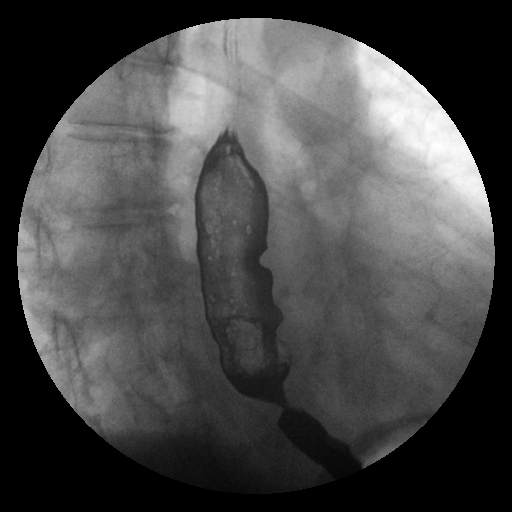

[Series 3: run · 1 of 1 slices shown (2 of 17)]
[im 1/1]
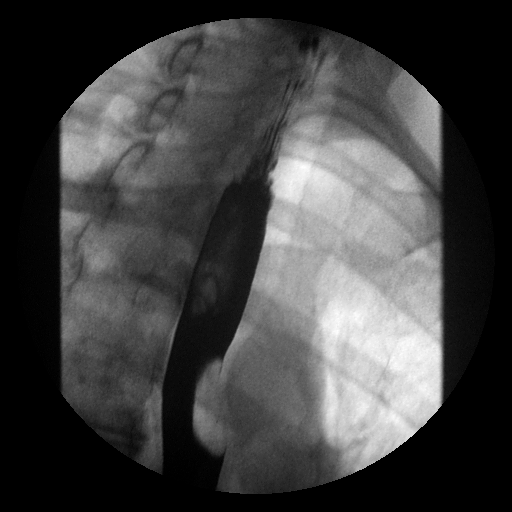

[Series 4: run · 1 of 1 slices shown (3 of 17)]
[im 1/1]
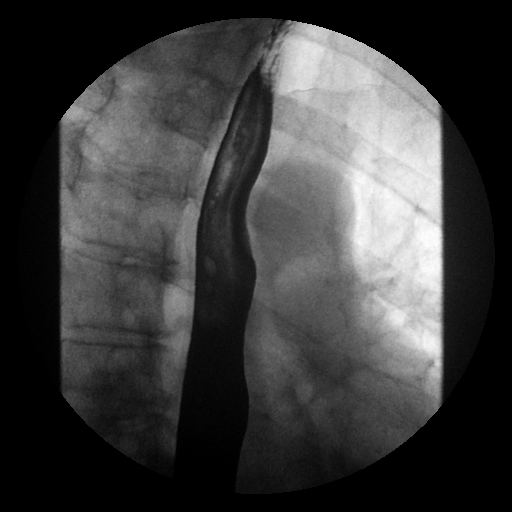

[Series 5: run · 1 of 1 slices shown (4 of 17)]
[im 1/1]
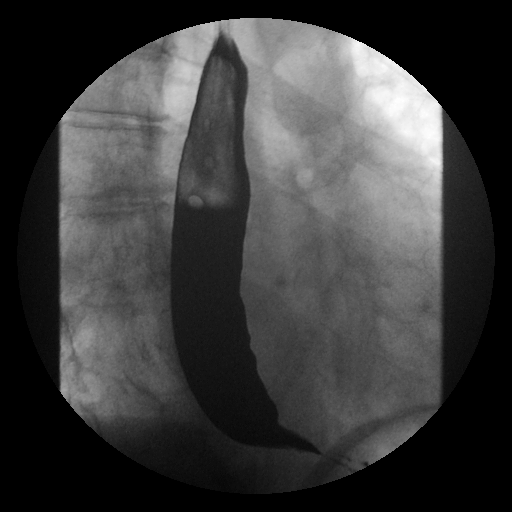

[Series 7: run · 1 of 1 slices shown (5 of 17)]
[im 1/1]
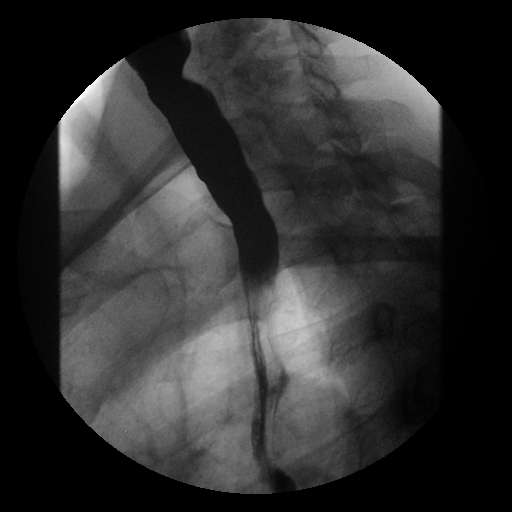

[Series 8: run · 1 of 1 slices shown (6 of 17)]
[im 1/1]
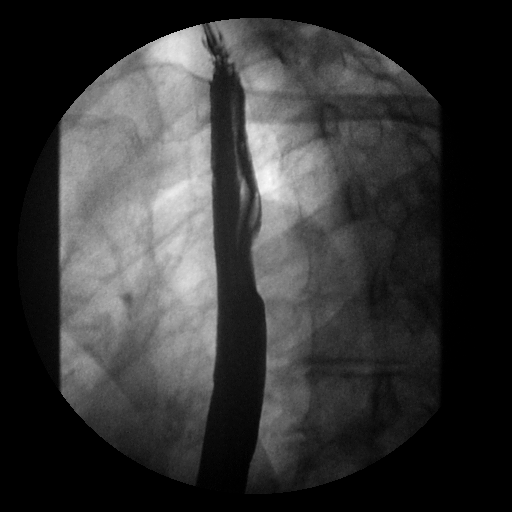

[Series 10: run · 1 of 1 slices shown (7 of 17)]
[im 1/1]
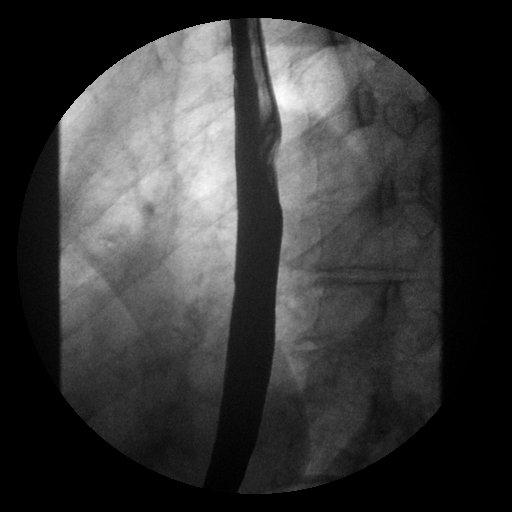

[Series 11: run · 1 of 1 slices shown (8 of 17)]
[im 1/1]
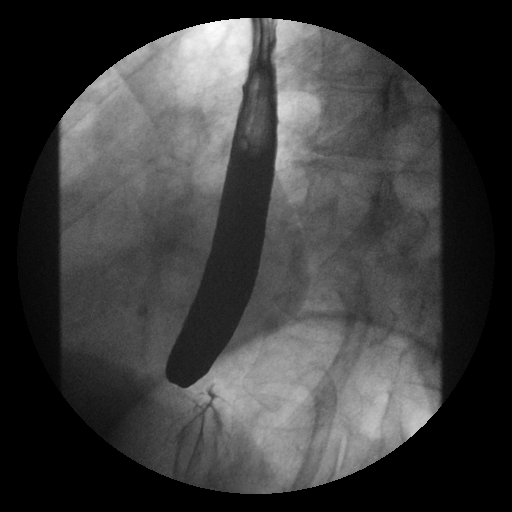

[Series 13: run · 1 of 15 slices shown (9 of 17)]
[im 1/15]
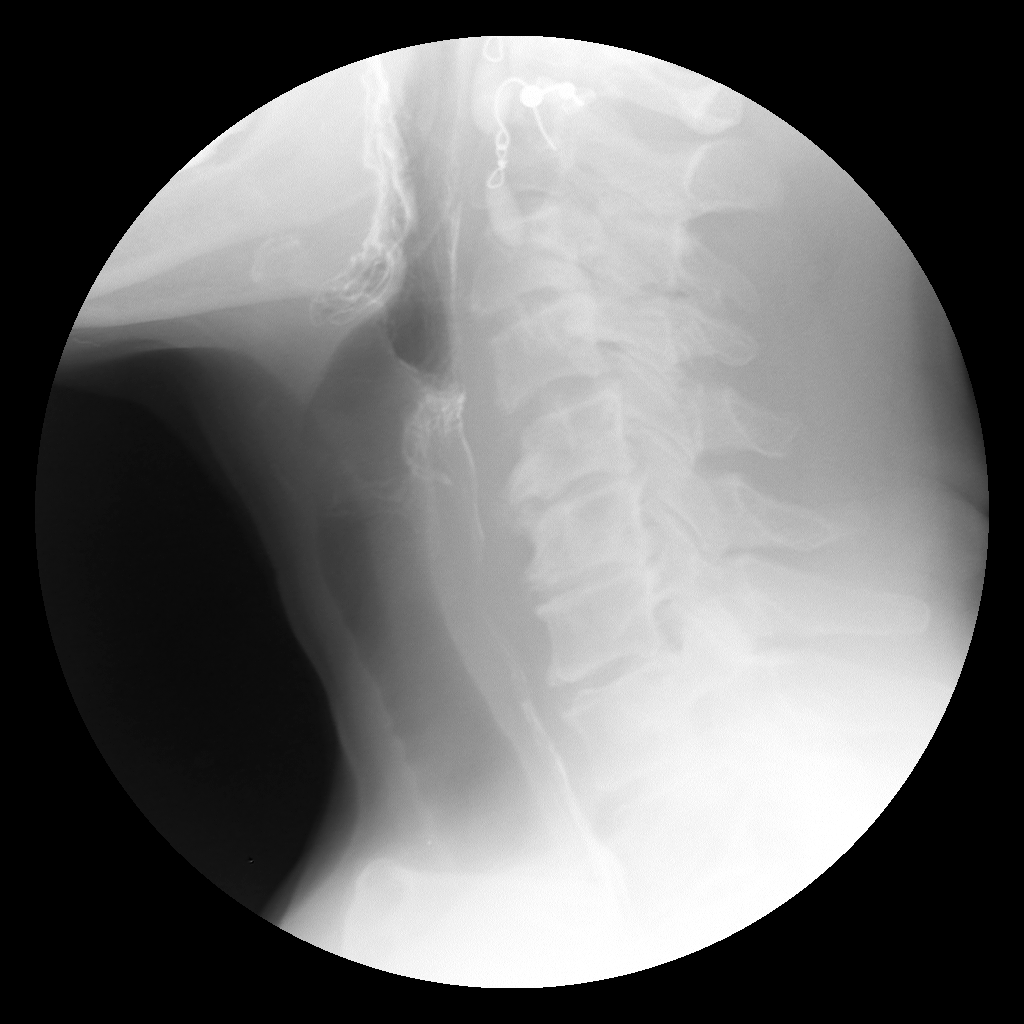

[Series 14: run · 1 of 1 slices shown (10 of 17)]
[im 1/1]
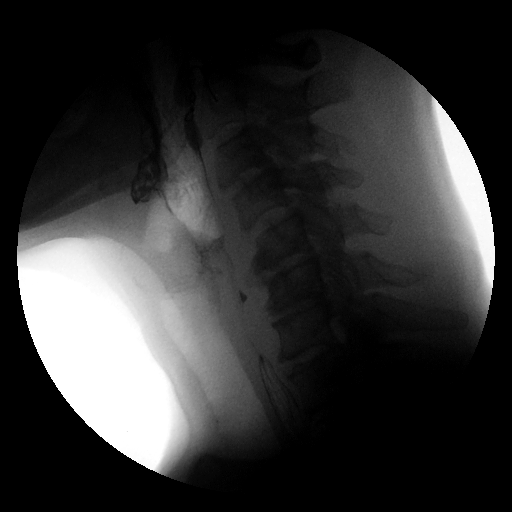

[Series 15: run · 1 of 1 slices shown (11 of 17)]
[im 1/1]
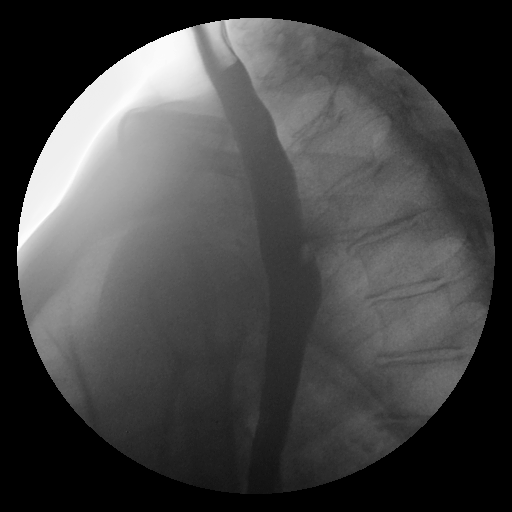

[Series 17: run · 1 of 1 slices shown (12 of 17)]
[im 1/1]
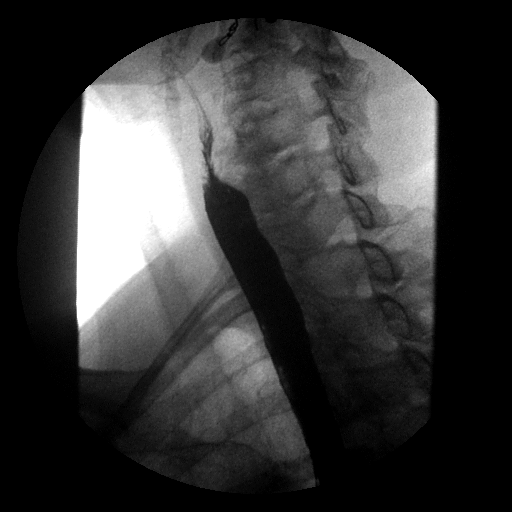

[Series 18: run · 1 of 1 slices shown (13 of 17)]
[im 1/1]
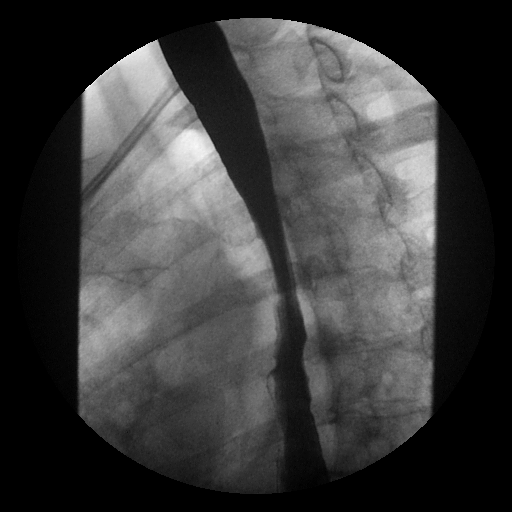

[Series 20: run · 1 of 1 slices shown (14 of 17)]
[im 1/1]
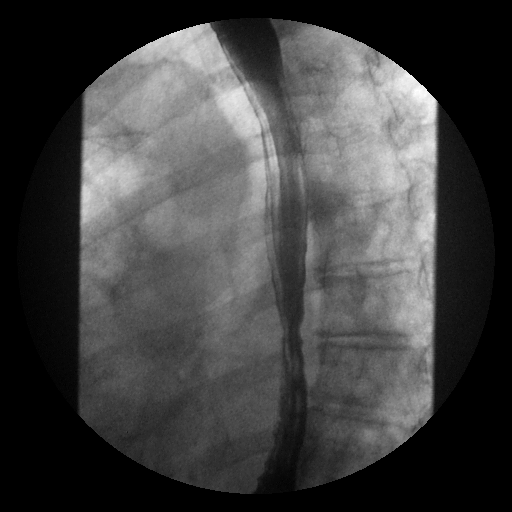

[Series 21: run · 1 of 1 slices shown (15 of 17)]
[im 1/1]
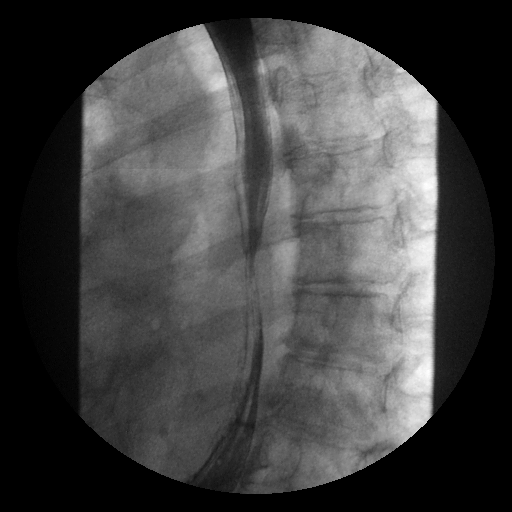

[Series 22: run · 1 of 1 slices shown (16 of 17)]
[im 1/1]
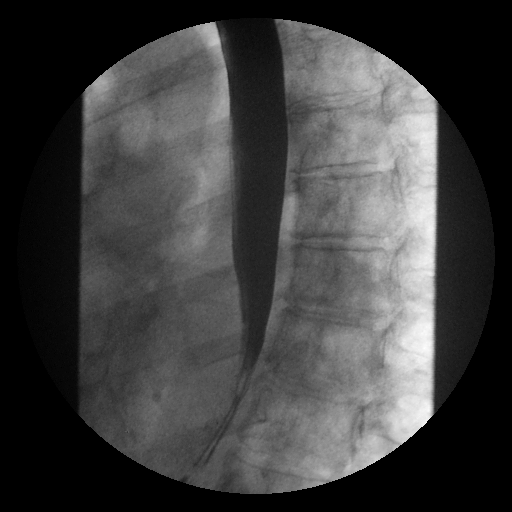

[Series 24: run · 1 of 1 slices shown (17 of 17)]
[im 1/1]
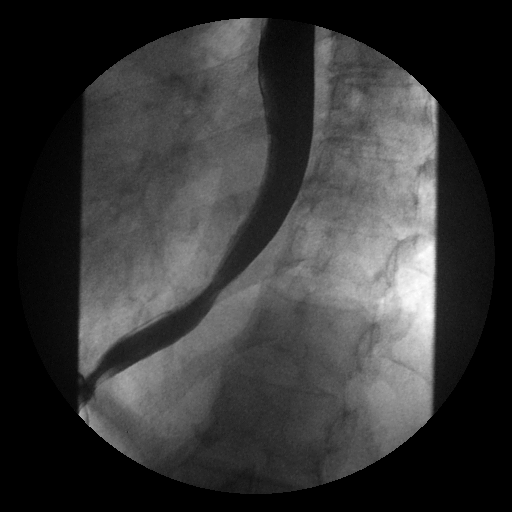

[17 of 24 positions shown; findings below may reference images not displayed]

FINDINGS: Normal esophageal distention and motility.
No esophageal mass or stricture.
Smooth appearance of esophageal mucosa on air-contrast images.
12.5 mm diameter barium tablet passes oral cavity stomach without
delay.
Few scattered tertiary waves noted.
No persistent intraluminal filling defects.
IMPRESSION: No significant esophageal abnormalities.

## 2015-10-01 ENCOUNTER — Other Ambulatory Visit: Payer: Self-pay

## 2015-10-01 ENCOUNTER — Ambulatory Visit (INDEPENDENT_AMBULATORY_CARE_PROVIDER_SITE_OTHER): Payer: Medicaid Other | Admitting: Gastroenterology

## 2015-10-01 ENCOUNTER — Encounter: Payer: Self-pay | Admitting: Gastroenterology

## 2015-10-01 VITALS — BP 130/96 | HR 77 | Temp 97.6°F | Ht 62.0 in | Wt 111.6 lb

## 2015-10-01 DIAGNOSIS — R112 Nausea with vomiting, unspecified: Secondary | ICD-10-CM | POA: Insufficient documentation

## 2015-10-01 DIAGNOSIS — K279 Peptic ulcer, site unspecified, unspecified as acute or chronic, without hemorrhage or perforation: Secondary | ICD-10-CM

## 2015-10-01 DIAGNOSIS — R1013 Epigastric pain: Secondary | ICD-10-CM | POA: Diagnosis not present

## 2015-10-01 DIAGNOSIS — R634 Abnormal weight loss: Secondary | ICD-10-CM | POA: Diagnosis not present

## 2015-10-01 MED ORDER — SUCRALFATE 1 GM/10ML PO SUSP: 1.0000 g | Freq: Three times a day (TID) | ORAL | Status: DC

## 2015-10-01 MED ORDER — ONDANSETRON HCL 4 MG PO TABS: 4.0000 mg | ORAL_TABLET | Freq: Three times a day (TID) | ORAL | Status: DC | PRN

## 2015-10-01 MED ORDER — PANTOPRAZOLE SODIUM 40 MG PO TBEC: 40.0000 mg | DELAYED_RELEASE_TABLET | Freq: Two times a day (BID) | ORAL | Status: DC

## 2015-10-01 NOTE — Assessment & Plan Note (Signed)
Ongoing epigastric pain worse with meals, associated with vomiting and weight loss. Documented gastric ulcer and moderate gastritis at time of last EGD on 07/31/2015. Suspect ongoing peptic ulcer disease. Denies illicit drug use. Denies alcohol.  Switch omeprazole to pantoprazole 40 mg twice daily before meals. Zofran as needed for nausea. Add Carafate 4 times daily for 10 days. Repeat upper endoscopy with Dr. Oneida Alar with deep sedation given history of polysubstance abuse/polypharmacy.  I have discussed the risks, alternatives, benefits with regards to but not limited to the risk of reaction to medication, bleeding, infection, perforation and the patient is agreeable to proceed. Written consent to be obtained.

## 2015-10-01 NOTE — Patient Instructions (Signed)
1. Stop omeprazole. Start pantoprazole 40 mg before breakfast and 40 mg before your evening meal. 2. Carafate before meals and at bedtime (4 times daily) for 10 days. 3. Zofran every 8 hours as needed for nausea. 4. Upper endoscopy with Dr. Oneida Alar. Please see separate instructions. 5. Call with questions or concerns.

## 2015-10-01 NOTE — Progress Notes (Signed)
cc'ed to pcp °

## 2015-10-01 NOTE — Progress Notes (Signed)
Primary Care Physician:  Neale Burly, MD  Primary Gastroenterologist:  Barney Drain, MD   Chief Complaint  Patient presents with  . Follow-up  . Abdominal Pain    HPI:  Jacqueline [name] is a 64 y.o. female here for follow up. Underwent EGD for dysphagia, weight loss, abdominal pain back on 07/31/2015. Esophagus appeared normal. Biopsies negative for his eosinophilic esophagitis. Impaired dilation performed. Irregular shaped and clean-based ulcer seen in the gastric body. Biopsy benign. Moderate nonerosive gastritis. Duodenal biopsies benign. Ibuprofen will switch to Celebrex.  Constant epigastric pain sometimes worse than others. Dentures not fitting so eating only soft foods. Down four additional pounds since last OV.  Epigastric region. A lot of nausea, some vomiting. No heartburn. Swallowing better so didn't go to [facility]. Laxative last week and now diarrhea. No melena, brbpr. No burning. Tums does not help. Back/neck/joint pain reason for antiinflammatories. Trying to avoid surgery. Doesn't feel like any better with switching to celebrex from ibuprofen. Avoids meals b/c of pp pain. Denies illicit drug use since 04/2015.   Current Outpatient Prescriptions  Medication Sig Dispense Refill  . acetaminophen (TYLENOL) 500 MG tablet Take 1,000 mg by mouth every 6 (six) hours as needed for mild pain.    . celecoxib (CELEBREX) 200 MG capsule Take 1 capsule (200 mg total) by mouth 2 (two) times daily. 60 capsule 11  . FLUoxetine (PROZAC) 20 MG capsule Take 1 capsule (20 mg total) by mouth daily. 30 capsule 0  . gabapentin (NEURONTIN) 300 MG capsule Take 2 capsules (600 mg total) by mouth 3 (three) times daily. 180 capsule 0  . methocarbamol (ROBAXIN) 750 MG tablet Take 1 tablet (750 mg total) by mouth 4 (four) times daily.    Marland Kitchen omeprazole (PRILOSEC) 20 MG capsule Take 20 mg by mouth 2 (two) times daily before a meal.    . traMADol (ULTRAM) 50 MG tablet Take 1 tablet (50 mg total) by mouth every 6  (six) hours as needed for moderate pain. 30 tablet 0  . traZODone (DESYREL) 50 MG tablet Take 1 tablet (50 mg total) by mouth at bedtime and may repeat dose one time if needed. 60 tablet 0   No current facility-administered medications for this visit.    Allergies as of 10/01/2015 - Review Complete 10/01/2015  Allergen Reaction Noted  . Aspirin Other (See Comments)   . Imitrex [sumatriptan base] Other (See Comments) 09/30/2011  . Ketorolac tromethamine Other (See Comments) 09/30/2011  . Nsaids Other (See Comments) 12/18/2011  . Promethazine hcl Other (See Comments) 12/18/2011  . Sulfonamide derivatives Nausea And Vomiting     Past Medical History  Diagnosis Date  . Gastric nodule 2009    EUS, ?leiomyoma  . Irritable bowel syndrome   . History of pancreatitis     Biliary and/or etoh?  Marland Kitchen Anxiety   . Chronic back pain   . GERD (gastroesophageal reflux disease)   . Migraines   . 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 Peripheral neuropathy ([place])   . Knee pain   . Gout   . Migraines   . Drug-seeking behavior   . Polysubstance abuse     opiates, cocaine, marijuana    Past Surgical History  Procedure Laterality Date  . Back surgery /ray cage fusion comberg, gso    . Toe graft    . Cesarean section    . 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  . Tonsillectomy    . Abdominal hysterectomy    . Dilation and curettage of uterus    . Gallbladder surgery  2010  . Eus  12/2008    Dr. Paulita Fujita. Gastric nodule most consistent with Leiomyoma. Slightly thickened and irregular gallbladder wall, possibly due to sludge or diminutive stones. Recommend EUS in January 2011 to followup gastric nodule.  . Esophagogastroduodenoscopy  11/2008    A  9-mm submucosal lesion seen in the cardia., gastritis, no hpylori  . Colonoscopy  2001    Dr. Laural Golden, hemorrhoids  . Cholecystectomy    . Colonoscopy  10/21/2011    KGM:WNUUVOZ polyp multiple/internal hemorrhoids  . Savory dilation  02/03/2012    [id number]:[id number] in the distal esophagus/Mild gastritis  . Back surgery    . Abdominal surgery    . Foot surgery    . Esophagogastroduodenoscopy (egd) with propofol N/A 07/31/2015    ZDG:LOVFIEP ulcer and moderate gastritis, dysphagia, empirical dilation with no identified source. esophageal, gastric, duodenal bx unremarkable.   . Esophageal dilation N/A 07/31/2015    Procedure: ESOPHAGEAL DILATION WIRE GUIDED WITH SAVORY DILATORS 15MM, 16MM;  Surgeon: Danie Binder, MD;  Location: [facility];  Service: Endoscopy;  Laterality: N/A;  . Esophageal biopsy  07/31/2015    Procedure: BIOPSY (DUODENAL, GASTRIC, GASTRIC ULCER, ESOPHAGEAL);  Surgeon: Danie Binder, MD;  Location: [facility];  Service: Endoscopy;;    Family History  Problem Relation Age of Onset  . Colon cancer Mother     diagnosed in late 70s and died age 23  . Heart failure Mother   . Heart defect      family history   . Arthritis      family history  . COPD      family history   . Cancer      multiple unknown type  . Heart attack Father     deceased at 71  . Hypertension Father   . Heart failure Father   . Lung cancer Maternal Uncle   . Throat cancer Maternal Uncle   . Colon cancer Paternal Uncle   . Colon cancer Maternal Aunt   . Colon cancer Other   . Anesthesia problems Neg Hx   . Hypotension Neg Hx   . Malignant hyperthermia Neg Hx   . Pseudochol deficiency Neg Hx     Social History   Social History  . Marital Status: Divorced    Spouse Name: N/A  . Number of Children: 2  . Years of Education: 10th grade   Occupational History  . disabled: back problems     Social History Main Topics  . Smoking status: Current Every Day Smoker -- 0.50 packs/day for 35 years    Types:  Cigarettes  . Smokeless tobacco: Never Used  . Alcohol Use: Yes     Comment: occasional holiday drink... pt denied alcohol use on admission to [facility]  . Drug Use: Yes    Special: Marijuana, Cocaine     Comment: pt takes a "puff" of marijuana if she hasn't had anything for nausea-last use 2 months ago, denies any use today 06/18/2014  . Sexual Activity: Not on file   Other Topics Concern  . Not on file   Social History Narrative      ROS:  General: no fever.  See hpi. Eyes: Negative for vision changes.  ENT: Negative for hoarseness, difficulty swallowing , nasal congestion. CV: Negative for chest pain, angina, palpitations, dyspnea on exertion, peripheral edema.  Respiratory: Negative for dyspnea at rest, dyspnea on exertion, cough, sputum, wheezing.  GI: See history of present illness. GU:  Negative for dysuria, hematuria, urinary incontinence, urinary frequency, nocturnal urination.  MS: Negative for joint pain, low back pain.  Derm: Negative for rash or itching.  Neuro: Negative for weakness, abnormal sensation, seizure, frequent headaches, memory loss, confusion.  Psych: Negative for anxiety, depression, suicidal ideation, hallucinations.  Endo: see hpi.  Heme: Negative for bruising or bleeding. Allergy: Negative for rash or hives.    Physical Examination:  BP 130/96 mmHg  Pulse 77  Temp(Src) 97.6 F (36.4 C)  Ht 5\' 2"  (1.575 m)  Wt 111 lb 9.6 oz (50.621 kg)  BMI 20.41 kg/m2   General: Well-nourished, well-developed in no acute distress.  Head: Normocephalic, atraumatic.   Eyes: Conjunctiva pink, no icterus. Mouth: Oropharyngeal mucosa moist and pink , no lesions erythema or exudate. edentulous Neck: Supple without thyromegaly, masses, or lymphadenopathy.  Lungs: Clear to auscultation bilaterally.  Heart: Regular rate and rhythm, no murmurs rubs or gallops.  Abdomen: Bowel sounds are normal, moderate epigastric tenderness, nondistended, no hepatosplenomegaly or  masses, no abdominal bruits or    hernia , no rebound or guarding.   Rectal: not performed Extremities: No lower extremity edema. No clubbing or deformities.  Neuro: Alert and oriented x 4 , grossly normal neurologically.  Skin: Warm and dry, no rash or jaundice.   Psych: Alert and cooperative, normal mood and affect.  Imaging Studies: No results found.  Lab Results  Component Value Date   ALT 4* 07/16/2015   AST 14 07/16/2015   ALKPHOS 74 07/16/2015   BILITOT 0.3 07/16/2015   Lab Results  Component Value Date   CREATININE 0.74 07/16/2015   BUN 7 07/16/2015   NA 140 07/16/2015   K 3.6 07/16/2015   CL 103 07/16/2015   CO2 27 07/16/2015   Lab Results  Component Value Date   WBC 9.0 07/16/2015   HGB 12.6 07/16/2015   HCT 37.8 07/16/2015   MCV 92.4 07/16/2015   PLT 295 07/16/2015

## 2015-10-16 NOTE — Patient Instructions (Addendum)
Jacqueline [name]  10/16/2015     @PREFPERIOPPHARMACY @   Your procedure is scheduled on Tuesday, 10/23/15.  Report to [facility] at [place].M.  Call this number if you have problems the morning of surgery:protonix, zofran, prozac, and tramadol if needed  [phone]   Remember:  Do not eat food or drink liquids after midnight.  Take these medicines the morning of surgery with A SIP OF WATER    Do not wear jewelry, make-up or nail polish.  Do not wear lotions, powders, or perfumes.  You may wear deodorant.  Do not shave 48 hours prior to surgery.  Men may shave face and neck.  Do not bring valuables to the hospital.  [facility] is not responsible for any belongings or valuables.  Contacts, dentures or bridgework may not be worn into surgery.  Leave your suitcase in the car.  After surgery it may be brought to your room.  For patients admitted to the hospital, discharge time will be determined by your treatment team.  Patients discharged the day of surgery will not be allowed to drive home.   Name and phone number of your driver:   family Special instructions:  Follow any special instructions given to you by the office.  Please read over the following fact sheets that you were given. Anesthesia Post-op Instructions and Care and Recovery After Surgery      Esophagogastroduodenoscopy Esophagogastroduodenoscopy (EGD) is a procedure that is used to examine the lining of the esophagus, stomach, and first part of the small intestine (duodenum). A long, flexible, lighted tube with a camera attached (endoscope) is inserted down the throat to view these organs. This procedure is done to detect problems or abnormalities, such as inflammation, bleeding, ulcers, or growths, in order to treat them. The procedure lasts 5-20 minutes. It is usually an outpatient procedure, but it may need to be performed in a hospital in emergency cases. LET [facility] CARE PROVIDER KNOW ABOUT:  Any allergies  you have.  All medicines you are taking, including vitamins, herbs, eye drops, creams, and over-the-counter medicines.  Previous problems you or members of your family have had with the use of anesthetics.  Any blood disorders you have.  Previous surgeries you have had.  Medical conditions you have. RISKS AND COMPLICATIONS Generally, this is a safe procedure. However, problems can occur and include:  Infection.  Bleeding.  Tearing (perforation) of the esophagus, stomach, or duodenum.  Difficulty breathing or not being able to breathe.  Excessive sweating.  Spasms of the larynx.  Slowed heartbeat.  Low blood pressure. BEFORE THE PROCEDURE  Do not eat or drink anything after midnight on the night before the procedure or as directed by your health care provider.  Do not take your regular medicines before the procedure if your health care provider asks you not to. Ask your health care provider about changing or stopping those medicines.  If you wear dentures, be prepared to remove them before the procedure.  Arrange for someone to drive you home after the procedure. PROCEDURE  A numbing medicine (local anesthetic) may be sprayed in your throat for comfort and to stop you from gagging or coughing.  You will have an IV tube inserted in a vein in your hand or arm. You will receive medicines and fluids through this tube.  You will be given a medicine to relax you (sedative).  A pain reliever will be given through the IV tube.  A mouth guard may  be placed in your mouth to protect your teeth and to keep you from biting on the endoscope.  You will be asked to lie on your left side.  The endoscope will be inserted down your throat and into your esophagus, stomach, and duodenum.  Air will be put through the endoscope to allow your health care provider to clearly view the lining of your esophagus.  The lining of your esophagus, stomach, and duodenum will be examined. During  the exam, your health care provider may:  Remove tissue to be examined under a microscope (biopsy) for inflammation, infection, or other medical problems.  Remove growths.  Remove objects (foreign bodies) that are stuck.  Treat any bleeding with medicines or other devices that stop tissues from bleeding (hot cautery, clipping devices).  Widen (dilate) or stretch narrowed areas of your esophagus and stomach.  The endoscope will be withdrawn. AFTER THE PROCEDURE  You will be taken to a recovery area for observation. Your blood pressure, heart rate, breathing rate, and blood oxygen level will be monitored often until the medicines you were given have worn off.  Do not eat or drink anything until the numbing medicine has worn off and your gag reflex has returned. You may choke.  Your health care provider should be able to discuss his or her findings with you. It will take longer to discuss the test results if any biopsies were taken.   This information is not intended to replace advice given to you by your health care provider. Make sure you discuss any questions you have with your health care provider.   Document Released: 04/10/2005 Document Revised: 12/29/2014 Document Reviewed: 11/10/2012 Elsevier Interactive Patient Education 2016 [place]. Esophagogastroduodenoscopy, Care After Refer to this sheet in the next few weeks. These instructions provide you with information about caring for yourself after your procedure. Your health care provider may also give you more specific instructions. Your treatment has been planned according to current medical practices, but problems sometimes occur. Call your health care provider if you have any problems or questions after your procedure. WHAT TO EXPECT AFTER THE PROCEDURE After your procedure, it is typical to feel:  Soreness in your throat.  Pain with swallowing.  Sick to your stomach (nauseous).  Bloated.  Dizzy.  Fatigued. HOME  CARE INSTRUCTIONS  Do not eat or drink anything until the numbing medicine (local anesthetic) has worn off and your gag reflex has returned. You will know that the local anesthetic has worn off when you can swallow comfortably.  Do not drive or operate machinery until directed by your health care provider.  Take medicines only as directed by your health care provider. SEEK MEDICAL CARE IF:   You cannot stop coughing.  You are not urinating at all or less than usual. SEEK IMMEDIATE MEDICAL CARE IF:  You have difficulty swallowing.  You cannot eat or drink.  You have worsening throat or chest pain.  You have dizziness or lightheadedness or you faint.  You have nausea or vomiting.  You have chills.  You have a fever.  You have severe abdominal pain.  You have black, tarry, or bloody stools.   This information is not intended to replace advice given to you by your health care provider. Make sure you discuss any questions you have with your health care provider.   Document Released: 11/24/2012 Document Revised: 12/29/2014 Document Reviewed: 11/24/2012 Elsevier Interactive Patient Education [employer].

## 2015-10-16 NOTE — Patient Instructions (Signed)
Jacqueline [name]  10/16/2015     @PREFPERIOPPHARMACY @   Your procedure is scheduled on Tuesday, 10/23/15.  Report to [facility] at [place].M.  Call this number if you have problems the morning of surgery:protonix, zofran, prozac, and tramadol if needed  [phone]   Remember:  Do not eat food or drink liquids after midnight.  Take these medicines the morning of surgery with A SIP OF WATER    Do not wear jewelry, make-up or nail polish.  Do not wear lotions, powders, or perfumes.  You may wear deodorant.  Do not shave 48 hours prior to surgery.  Men may shave face and neck.  Do not bring valuables to the hospital.  [facility] is not responsible for any belongings or valuables.  Contacts, dentures or bridgework may not be worn into surgery.  Leave your suitcase in the car.  After surgery it may be brought to your room.  For patients admitted to the hospital, discharge time will be determined by your treatment team.  Patients discharged the day of surgery will not be allowed to drive home.   Name and phone number of your driver:   family Special instructions:  Follow any special instructions given to you by the office.  Please read over the following fact sheets that you were given. Anesthesia Post-op Instructions and Care and Recovery After Surgery      Esophagogastroduodenoscopy Esophagogastroduodenoscopy (EGD) is a procedure that is used to examine the lining of the esophagus, stomach, and first part of the small intestine (duodenum). A long, flexible, lighted tube with a camera attached (endoscope) is inserted down the throat to view these organs. This procedure is done to detect problems or abnormalities, such as inflammation, bleeding, ulcers, or growths, in order to treat them. The procedure lasts 5-20 minutes. It is usually an outpatient procedure, but it may need to be performed in a hospital in emergency cases. LET [facility] CARE PROVIDER KNOW ABOUT:  Any allergies  you have.  All medicines you are taking, including vitamins, herbs, eye drops, creams, and over-the-counter medicines.  Previous problems you or members of your family have had with the use of anesthetics.  Any blood disorders you have.  Previous surgeries you have had.  Medical conditions you have. RISKS AND COMPLICATIONS Generally, this is a safe procedure. However, problems can occur and include:  Infection.  Bleeding.  Tearing (perforation) of the esophagus, stomach, or duodenum.  Difficulty breathing or not being able to breathe.  Excessive sweating.  Spasms of the larynx.  Slowed heartbeat.  Low blood pressure. BEFORE THE PROCEDURE  Do not eat or drink anything after midnight on the night before the procedure or as directed by your health care provider.  Do not take your regular medicines before the procedure if your health care provider asks you not to. Ask your health care provider about changing or stopping those medicines.  If you wear dentures, be prepared to remove them before the procedure.  Arrange for someone to drive you home after the procedure. PROCEDURE  A numbing medicine (local anesthetic) may be sprayed in your throat for comfort and to stop you from gagging or coughing.  You will have an IV tube inserted in a vein in your hand or arm. You will receive medicines and fluids through this tube.  You will be given a medicine to relax you (sedative).  A pain reliever will be given through the IV tube.  A mouth guard may  be placed in your mouth to protect your teeth and to keep you from biting on the endoscope.  You will be asked to lie on your left side.  The endoscope will be inserted down your throat and into your esophagus, stomach, and duodenum.  Air will be put through the endoscope to allow your health care provider to clearly view the lining of your esophagus.  The lining of your esophagus, stomach, and duodenum will be examined. During  the exam, your health care provider may:  Remove tissue to be examined under a microscope (biopsy) for inflammation, infection, or other medical problems.  Remove growths.  Remove objects (foreign bodies) that are stuck.  Treat any bleeding with medicines or other devices that stop tissues from bleeding (hot cautery, clipping devices).  Widen (dilate) or stretch narrowed areas of your esophagus and stomach.  The endoscope will be withdrawn. AFTER THE PROCEDURE  You will be taken to a recovery area for observation. Your blood pressure, heart rate, breathing rate, and blood oxygen level will be monitored often until the medicines you were given have worn off.  Do not eat or drink anything until the numbing medicine has worn off and your gag reflex has returned. You may choke.  Your health care provider should be able to discuss his or her findings with you. It will take longer to discuss the test results if any biopsies were taken.   This information is not intended to replace advice given to you by your health care provider. Make sure you discuss any questions you have with your health care provider.   Document Released: 04/10/2005 Document Revised: 12/29/2014 Document Reviewed: 11/10/2012 Elsevier Interactive Patient Education 2016 [place]. Esophagogastroduodenoscopy, Care After Refer to this sheet in the next few weeks. These instructions provide you with information about caring for yourself after your procedure. Your health care provider may also give you more specific instructions. Your treatment has been planned according to current medical practices, but problems sometimes occur. Call your health care provider if you have any problems or questions after your procedure. WHAT TO EXPECT AFTER THE PROCEDURE After your procedure, it is typical to feel:  Soreness in your throat.  Pain with swallowing.  Sick to your stomach (nauseous).  Bloated.  Dizzy.  Fatigued. HOME  CARE INSTRUCTIONS  Do not eat or drink anything until the numbing medicine (local anesthetic) has worn off and your gag reflex has returned. You will know that the local anesthetic has worn off when you can swallow comfortably.  Do not drive or operate machinery until directed by your health care provider.  Take medicines only as directed by your health care provider. SEEK MEDICAL CARE IF:   You cannot stop coughing.  You are not urinating at all or less than usual. SEEK IMMEDIATE MEDICAL CARE IF:  You have difficulty swallowing.  You cannot eat or drink.  You have worsening throat or chest pain.  You have dizziness or lightheadedness or you faint.  You have nausea or vomiting.  You have chills.  You have a fever.  You have severe abdominal pain.  You have black, tarry, or bloody stools.   This information is not intended to replace advice given to you by your health care provider. Make sure you discuss any questions you have with your health care provider.   Document Released: 11/24/2012 Document Revised: 12/29/2014 Document Reviewed: 11/24/2012 Elsevier Interactive Patient Education [employer].

## 2015-10-17 ENCOUNTER — Encounter (HOSPITAL_COMMUNITY)
Admission: RE | Admit: 2015-10-17 | Discharge: 2015-10-17 | Disposition: A | Discharge status: Home / Self Care | Payer: Medicaid Other | Source: Ambulatory Visit | Attending: Gastroenterology | Admitting: Gastroenterology

## 2015-10-18 ENCOUNTER — Other Ambulatory Visit: Payer: Self-pay

## 2015-10-18 ENCOUNTER — Telehealth: Payer: Self-pay

## 2015-10-18 NOTE — Telephone Encounter (Signed)
REVIEWED-NO ADDITIONAL RECOMMENDATIONS. 

## 2015-10-18 NOTE — Telephone Encounter (Signed)
Called pt and verified that she needed to reschedule her EGD. Pt states she has some personal things come up and she is moving. Wants to reschedule procedure to 11/13/2015.   Reviewed H&P with pt and medications. No changes noted.   Mailed new instructions to pt.

## 2015-11-07 NOTE — Patient Instructions (Signed)
Jacqueline [name]  11/07/2015     @PREFPERIOPPHARMACY @   Your procedure is scheduled on 11/13/2015.  Report to [facility] at 8:00 A.M.  Call this number if you have problems the morning of surgery:  [phone]   Remember:  Do not eat food or drink liquids after midnight.  Take these medicines the morning of surgery with A SIP OF WATER Celebrex, Prozac, Zofran, Protonix, Carafate, Ultram   Do not wear jewelry, make-up or nail polish.  Do not wear lotions, powders, or perfumes.  You may wear deodorant.  Do not shave 48 hours prior to surgery.  Men may shave face and neck.  Do not bring valuables to the hospital.  [facility] is not responsible for any belongings or valuables.  Contacts, dentures or bridgework may not be worn into surgery.  Leave your suitcase in the car.  After surgery it may be brought to your room.  For patients admitted to the hospital, discharge time will be determined by your treatment team.  Patients discharged the day of surgery will not be allowed to drive home.    Please read over the following fact sheets that you were given. Anesthesia Post-op Instructions     PATIENT INSTRUCTIONS POST-ANESTHESIA  IMMEDIATELY FOLLOWING SURGERY:  Do not drive or operate machinery for the first twenty four hours after surgery.  Do not make any important decisions for twenty four hours after surgery or while taking narcotic pain medications or sedatives.  If you develop intractable nausea and vomiting or a severe headache please notify your doctor immediately.  FOLLOW-UP:  Please make an appointment with your surgeon as instructed. You do not need to follow up with anesthesia unless specifically instructed to do so.  WOUND CARE INSTRUCTIONS (if applicable):  Keep a dry clean dressing on the anesthesia/puncture wound site if there is drainage.  Once the wound has quit draining you may leave it open to air.  Generally you should leave the bandage intact for twenty four  hours unless there is drainage.  If the epidural site drains for more than 36-48 hours please call the anesthesia department.  QUESTIONS?:  Please feel free to call your physician or the hospital operator if you have any questions, and they will be happy to assist you.      Esophagogastroduodenoscopy Esophagogastroduodenoscopy (EGD) is a procedure that is used to examine the lining of the esophagus, stomach, and first part of the small intestine (duodenum). A long, flexible, lighted tube with a camera attached (endoscope) is inserted down the throat to view these organs. This procedure is done to detect problems or abnormalities, such as inflammation, bleeding, ulcers, or growths, in order to treat them. The procedure lasts 5-20 minutes. It is usually an outpatient procedure, but it may need to be performed in a hospital in emergency cases. LET [facility] CARE PROVIDER KNOW ABOUT:  Any allergies you have.  All medicines you are taking, including vitamins, herbs, eye drops, creams, and over-the-counter medicines.  Previous problems you or members of your family have had with the use of anesthetics.  Any blood disorders you have.  Previous surgeries you have had.  Medical conditions you have. RISKS AND COMPLICATIONS Generally, this is a safe procedure. However, problems can occur and include:  Infection.  Bleeding.  Tearing (perforation) of the esophagus, stomach, or duodenum.  Difficulty breathing or not being able to breathe.  Excessive sweating.  Spasms of the larynx.  Slowed heartbeat.  Low blood pressure. BEFORE  THE PROCEDURE  Do not eat or drink anything after midnight on the night before the procedure or as directed by your health care provider.  Do not take your regular medicines before the procedure if your health care provider asks you not to. Ask your health care provider about changing or stopping those medicines.  If you wear dentures, be prepared to remove  them before the procedure.  Arrange for someone to drive you home after the procedure. PROCEDURE  A numbing medicine (local anesthetic) may be sprayed in your throat for comfort and to stop you from gagging or coughing.  You will have an IV tube inserted in a vein in your hand or arm. You will receive medicines and fluids through this tube.  You will be given a medicine to relax you (sedative).  A pain reliever will be given through the IV tube.  A mouth guard may be placed in your mouth to protect your teeth and to keep you from biting on the endoscope.  You will be asked to lie on your left side.  The endoscope will be inserted down your throat and into your esophagus, stomach, and duodenum.  Air will be put through the endoscope to allow your health care provider to clearly view the lining of your esophagus.  The lining of your esophagus, stomach, and duodenum will be examined. During the exam, your health care provider may:  Remove tissue to be examined under a microscope (biopsy) for inflammation, infection, or other medical problems.  Remove growths.  Remove objects (foreign bodies) that are stuck.  Treat any bleeding with medicines or other devices that stop tissues from bleeding (hot cautery, clipping devices).  Widen (dilate) or stretch narrowed areas of your esophagus and stomach.  The endoscope will be withdrawn. AFTER THE PROCEDURE  You will be taken to a recovery area for observation. Your blood pressure, heart rate, breathing rate, and blood oxygen level will be monitored often until the medicines you were given have worn off.  Do not eat or drink anything until the numbing medicine has worn off and your gag reflex has returned. You may choke.  Your health care provider should be able to discuss his or her findings with you. It will take longer to discuss the test results if any biopsies were taken.   This information is not intended to replace advice given to  you by your health care provider. Make sure you discuss any questions you have with your health care provider.   Document Released: 04/10/2005 Document Revised: 12/29/2014 Document Reviewed: 11/10/2012 Elsevier Interactive Patient Education [employer].

## 2015-11-08 ENCOUNTER — Encounter (HOSPITAL_COMMUNITY)
Admission: RE | Admit: 2015-11-08 | Discharge: 2015-11-08 | Disposition: A | Discharge status: Home / Self Care | Payer: Medicaid Other | Source: Ambulatory Visit | Attending: Gastroenterology | Admitting: Gastroenterology

## 2015-11-08 ENCOUNTER — Encounter (HOSPITAL_COMMUNITY): Payer: Self-pay

## 2015-11-08 DIAGNOSIS — K279 Peptic ulcer, site unspecified, unspecified as acute or chronic, without hemorrhage or perforation: Secondary | ICD-10-CM | POA: Diagnosis not present

## 2015-11-08 DIAGNOSIS — Z01818 Encounter for other preprocedural examination: Secondary | ICD-10-CM | POA: Diagnosis present

## 2015-11-08 DIAGNOSIS — R1013 Epigastric pain: Secondary | ICD-10-CM | POA: Insufficient documentation

## 2015-11-08 LAB — BASIC METABOLIC PANEL
ANION GAP: 6 (ref 5–15)
BUN: 8 mg/dL (ref 6–20)
CALCIUM: 9.7 mg/dL (ref 8.9–10.3)
CO2: 28 mmol/L (ref 22–32)
Chloride: 104 mmol/L (ref 101–111)
Creatinine, Ser: 0.61 mg/dL (ref 0.44–1.00)
Glucose, Bld: 116 mg/dL — ABNORMAL HIGH (ref 65–99)
POTASSIUM: 4.5 mmol/L (ref 3.5–5.1)
Sodium: 138 mmol/L (ref 135–145)

## 2015-11-08 LAB — CBC
HEMATOCRIT: 51 % — AB (ref 36.0–46.0)
HEMOGLOBIN: 14.2 g/dL (ref 12.0–15.0)
MCH: 32.8 pg (ref 26.0–34.0)
MCHC: 27.8 g/dL — ABNORMAL LOW (ref 30.0–36.0)
MCV: 117.8 fL — ABNORMAL HIGH (ref 78.0–100.0)
Platelets: 217 10*3/uL (ref 150–400)
RBC: 4.33 MIL/uL (ref 3.87–5.11)
RDW: 14.9 % (ref 11.5–15.5)
WBC: 8 10*3/uL (ref 4.0–10.5)

## 2015-11-08 NOTE — Pre-Procedure Instructions (Signed)
Patient given information to sign up for my chart at home. 

## 2015-11-13 ENCOUNTER — Encounter (HOSPITAL_COMMUNITY)
Admission: RE | Disposition: A | Discharge status: Home / Self Care | Payer: Self-pay | Source: Ambulatory Visit | Attending: Gastroenterology

## 2015-11-13 ENCOUNTER — Ambulatory Visit (HOSPITAL_COMMUNITY): Payer: Medicaid Other | Admitting: Anesthesiology

## 2015-11-13 ENCOUNTER — Encounter (HOSPITAL_COMMUNITY): Payer: Self-pay | Admitting: *Deleted

## 2015-11-13 ENCOUNTER — Ambulatory Visit (HOSPITAL_COMMUNITY)
Admission: RE | Admit: 2015-11-13 | Discharge: 2015-11-13 | Disposition: A | Discharge status: Home / Self Care | Payer: Medicaid Other | Source: Ambulatory Visit | Attending: Gastroenterology | Admitting: Gastroenterology

## 2015-11-13 DIAGNOSIS — Z79899 Other long term (current) drug therapy: Secondary | ICD-10-CM | POA: Insufficient documentation

## 2015-11-13 DIAGNOSIS — Z882 Allergy status to sulfonamides status: Secondary | ICD-10-CM | POA: Insufficient documentation

## 2015-11-13 DIAGNOSIS — K2970 Gastritis, unspecified, without bleeding: Secondary | ICD-10-CM

## 2015-11-13 DIAGNOSIS — Z888 Allergy status to other drugs, medicaments and biological substances status: Secondary | ICD-10-CM | POA: Insufficient documentation

## 2015-11-13 DIAGNOSIS — G629 Polyneuropathy, unspecified: Secondary | ICD-10-CM | POA: Diagnosis not present

## 2015-11-13 DIAGNOSIS — M545 Low back pain: Secondary | ICD-10-CM | POA: Insufficient documentation

## 2015-11-13 DIAGNOSIS — K219 Gastro-esophageal reflux disease without esophagitis: Secondary | ICD-10-CM | POA: Insufficient documentation

## 2015-11-13 DIAGNOSIS — F419 Anxiety disorder, unspecified: Secondary | ICD-10-CM | POA: Insufficient documentation

## 2015-11-13 DIAGNOSIS — F17200 Nicotine dependence, unspecified, uncomplicated: Secondary | ICD-10-CM | POA: Insufficient documentation

## 2015-11-13 DIAGNOSIS — G8929 Other chronic pain: Secondary | ICD-10-CM | POA: Insufficient documentation

## 2015-11-13 DIAGNOSIS — K589 Irritable bowel syndrome without diarrhea: Secondary | ICD-10-CM | POA: Insufficient documentation

## 2015-11-13 DIAGNOSIS — R1013 Epigastric pain: Secondary | ICD-10-CM

## 2015-11-13 DIAGNOSIS — K2960 Other gastritis without bleeding: Secondary | ICD-10-CM | POA: Insufficient documentation

## 2015-11-13 DIAGNOSIS — Q2733 Arteriovenous malformation of digestive system vessel: Secondary | ICD-10-CM | POA: Diagnosis not present

## 2015-11-13 HISTORY — PX: ESOPHAGOGASTRODUODENOSCOPY (EGD) WITH PROPOFOL: SHX5813

## 2015-11-13 SURGERY — ESOPHAGOGASTRODUODENOSCOPY (EGD) WITH PROPOFOL
Anesthesia: Monitor Anesthesia Care

## 2015-11-13 MED ORDER — LIDOCAINE HCL (CARDIAC) 10 MG/ML IV SOLN
INTRAVENOUS | Status: DC | PRN
  Administered 2015-11-13: 50 mg via INTRAVENOUS

## 2015-11-13 MED ORDER — MIDAZOLAM HCL 2 MG/2ML IJ SOLN
INTRAMUSCULAR | Status: AC
  Filled 2015-11-13: qty 2

## 2015-11-13 MED ORDER — FENTANYL CITRATE (PF) 100 MCG/2ML IJ SOLN
INTRAMUSCULAR | Status: AC
  Filled 2015-11-13: qty 2

## 2015-11-13 MED ORDER — PROPOFOL 500 MG/50ML IV EMUL
INTRAVENOUS | Status: DC | PRN
  Administered 2015-11-13: 200 ug/kg/min via INTRAVENOUS

## 2015-11-13 MED ORDER — MIDAZOLAM HCL 2 MG/2ML IJ SOLN
1.0000 mg | INTRAMUSCULAR | Status: DC | PRN
  Administered 2015-11-13 (×2): 2 mg via INTRAVENOUS
  Filled 2015-11-13: qty 2

## 2015-11-13 MED ORDER — LACTATED RINGERS IV SOLN
INTRAVENOUS | Status: DC
  Administered 2015-11-13: 08:00:00 via INTRAVENOUS

## 2015-11-13 MED ORDER — FENTANYL CITRATE (PF) 100 MCG/2ML IJ SOLN: 25.0000 ug | INTRAMUSCULAR | Status: DC | PRN

## 2015-11-13 MED ORDER — ARTIFICIAL TEARS OP OINT
TOPICAL_OINTMENT | OPHTHALMIC | Status: AC
  Filled 2015-11-13: qty 3.5

## 2015-11-13 MED ORDER — FENTANYL CITRATE (PF) 100 MCG/2ML IJ SOLN
INTRAMUSCULAR | Status: DC | PRN
  Administered 2015-11-13 (×2): 25 ug via INTRAVENOUS

## 2015-11-13 MED ORDER — ONDANSETRON HCL 4 MG/2ML IJ SOLN: 4.0000 mg | Freq: Once | INTRAMUSCULAR | Status: DC | PRN

## 2015-11-13 MED ORDER — LIDOCAINE VISCOUS 2 % MT SOLN
15.0000 mL | Freq: Once | OROMUCOSAL | Status: AC
  Administered 2015-11-13: 15 mL via OROMUCOSAL

## 2015-11-13 MED ORDER — LIDOCAINE VISCOUS 2 % MT SOLN
OROMUCOSAL | Status: AC
  Filled 2015-11-13: qty 15

## 2015-11-13 MED ORDER — FENTANYL CITRATE (PF) 100 MCG/2ML IJ SOLN
25.0000 ug | INTRAMUSCULAR | Status: AC
  Administered 2015-11-13 (×2): 25 ug via INTRAVENOUS

## 2015-11-13 MED ORDER — STERILE WATER FOR IRRIGATION IR SOLN
Status: DC | PRN
  Administered 2015-11-13: 10:00:00

## 2015-11-13 SURGICAL SUPPLY — 20 items
BLOCK BITE 60FR ADLT L/F BLUE (MISCELLANEOUS) ×2 IMPLANT
ELECT REM PT RETURN 9FT ADLT (ELECTROSURGICAL)
ELECTRODE REM PT RTRN 9FT ADLT (ELECTROSURGICAL) IMPLANT
FLOOR PAD 36X40 (MISCELLANEOUS) ×3
FORCEPS BIOP RAD 4 LRG CAP 4 (CUTTING FORCEPS) IMPLANT
FORMALIN 10 PREFIL 20ML (MISCELLANEOUS) IMPLANT
KIT ENDO PROCEDURE PEN (KITS) ×3 IMPLANT
MANIFOLD NEPTUNE II (INSTRUMENTS) ×3 IMPLANT
NDL SCLEROTHERAPY 25GX240 (NEEDLE) IMPLANT
NEEDLE SCLEROTHERAPY 25GX240 (NEEDLE) IMPLANT
PAD FLOOR 36X40 (MISCELLANEOUS) IMPLANT
PROBE APC STR FIRE (PROBE) IMPLANT
PROBE INJECTION GOLD (MISCELLANEOUS)
PROBE INJECTION GOLD 7FR (MISCELLANEOUS) IMPLANT
SNARE SHORT THROW 13M SML OVAL (MISCELLANEOUS) IMPLANT
SYR 50ML LL SCALE MARK (SYRINGE) ×2 IMPLANT
SYR INFLATION 60ML (SYRINGE) IMPLANT
TUBING INSUFFLATOR CO2MPACT (TUBING) ×3 IMPLANT
TUBING IRRIGATION ENDOGATOR (MISCELLANEOUS) ×2 IMPLANT
WATER STERILE IRR 1000ML POUR (IV SOLUTION) ×2 IMPLANT

## 2015-11-13 NOTE — Anesthesia Procedure Notes (Addendum)
Procedure Name: MAC Date/Time: 11/13/2015 9:38 AM Performed by: Vista Deck Pre-anesthesia Checklist: Patient identified, Emergency Drugs available, Suction available, Timeout performed and Patient being monitored Patient Re-evaluated:Patient Re-evaluated prior to inductionOxygen Delivery Method: Non-rebreather mask

## 2015-11-13 NOTE — Progress Notes (Signed)
REVIEWED-NO ADDITIONAL RECOMMENDATIONS. 

## 2015-11-13 NOTE — Anesthesia Postprocedure Evaluation (Signed)
Anesthesia Post Note  Patient: [name]  Procedure(s) Performed: Procedure(s) (LRB): ESOPHAGOGASTRODUODENOSCOPY (EGD) WITH PROPOFOL (N/A)  Patient location during evaluation: PACU Anesthesia Type: MAC Level of consciousness: awake and patient cooperative Pain management: pain level controlled Vital Signs Assessment: post-procedure vital signs reviewed and stable Respiratory status: spontaneous breathing Cardiovascular status: stable Anesthetic complications: no    Last Vitals:  Filed Vitals:   11/13/15 0930 11/13/15 1010  BP: 125/86 116/73  Temp:  36.6 C  Resp: 19 16    Last Pain:  Filed Vitals:   11/13/15 1019  PainSc: 6                  Jadarious Dobbins

## 2015-11-13 NOTE — Anesthesia Preprocedure Evaluation (Signed)
Anesthesia Evaluation  Patient identified by MRN, date of birth, ID band Patient awake    Reviewed: Allergy & Precautions, H&P , NPO status , Patient's Chart, lab work & pertinent test results  History of Anesthesia Complications Negative for: history of anesthetic complications  Airway Mallampati: I  TM Distance: >3 FB     Dental  (+) Edentulous Upper, Teeth Intact   Pulmonary Current Smoker,    Pulmonary exam normal        Cardiovascular negative cardio ROS   Rhythm:Regular Rate:Normal     Neuro/Psych  Headaches, PSYCHIATRIC DISORDERS Anxiety Depression  Neuromuscular disease    GI/Hepatic PUD, GERD  Medicated and Controlled,  Endo/Other    Renal/GU      Musculoskeletal   Abdominal   Peds  Hematology   Anesthesia Other Findings   Reproductive/Obstetrics                             Anesthesia Physical Anesthesia Plan  ASA: II  Anesthesia Plan: MAC   Post-op Pain Management:    Induction: Intravenous  Airway Management Planned: Simple Face Mask  Additional Equipment:   Intra-op Plan:   Post-operative Plan:   Informed Consent: I have reviewed the patients History and Physical, chart, labs and discussed the procedure including the risks, benefits and alternatives for the proposed anesthesia with the patient or authorized representative who has indicated his/her understanding and acceptance.     Plan Discussed with:   Anesthesia Plan Comments:         Anesthesia Quick Evaluation

## 2015-11-13 NOTE — Discharge Instructions (Signed)
YOUR UPPER ABDOMINAL PAIN IS DUE TO MILD gastritis & DUODENITIS.    FOLLOW A LOW FAT DIET. SEE INFO BELOW.  TAKE PROTONIX 30 MINUTES PRIOR TO MEALS TWICE DAILY.  SEE GI DOCTORS AT [facility] FOR ABDOMINAL PAIN AND SWALLWING PROBLEMS.   FOLLOW UP IN 4 MOS.    UPPER ENDOSCOPY AFTER CARE Read the instructions outlined below and refer to this sheet in the next week. These discharge instructions provide you with general information on caring for yourself after you leave the hospital. While your treatment has been planned according to the most current medical practices available, unavoidable complications occasionally occur. If you have any problems or questions after discharge, call DR. Teyona Nichelson, [phone].  ACTIVITY  You may resume your regular activity, but move at a slower pace for the next 24 hours.   Take frequent rest periods for the next 24 hours.   Walking will help get rid of the air and reduce the bloated feeling in your belly (abdomen).   No driving for 24 hours (because of the medicine (anesthesia) used during the test).   You may shower.   Do not sign any important legal documents or operate any machinery for 24 hours (because of the anesthesia used during the test).    NUTRITION  Drink plenty of fluids.   You may resume your normal diet as instructed by your doctor.   Begin with a light meal and progress to your normal diet. Heavy or fried foods are harder to digest and may make you feel sick to your stomach (nauseated).   Avoid alcoholic beverages for 24 hours or as instructed.    MEDICATIONS  You may resume your normal medications.   WHAT YOU CAN EXPECT TODAY  Some feelings of bloating in the abdomen.   Passage of more gas than usual.    IF YOU HAD A BIOPSY TAKEN DURING THE UPPER ENDOSCOPY:  Eat a soft diet IF YOU HAVE NAUSEA, BLOATING, ABDOMINAL PAIN, OR VOMITING.    FINDING OUT THE RESULTS OF YOUR TEST Not all test results are available during  your visit. DR. Oneida Alar WILL CALL YOU WITHIN 14 DAYS OF YOUR PROCEDUE WITH YOUR RESULTS. Do not assume everything is normal if you have not heard from DR. Christi Wirick, CALL HER OFFICE AT [phone].  SEEK IMMEDIATE MEDICAL ATTENTION AND CALL THE OFFICE: [phone] IF:  You have more than a spotting of blood in your stool.   Your belly is swollen (abdominal distention).   You are nauseated or vomiting.   You have a temperature over 101F.   You have abdominal pain or discomfort that is severe or gets worse throughout the day.   Gastritis/DUODENITIS  Gastritis is an inflammation (the body's way of reacting to injury and/or infection) of the stomach. DUODENITIS is an inflammation (the body's way of reacting to injury and/or infection) of the FIRST PART OF THE SMALL INTESTINES. It is often caused by bacterial (germ) infections. It can also be caused BY ASPIRIN, BC/GOODY POWDER'S, (IBUPROFEN) MOTRIN, OR ALEVE (NAPROXEN), chemicals (including alcohol), SPICY FOODS, and medications. This illness may be associated with generalized malaise (feeling tired, not well), UPPER ABDOMINAL STOMACH cramps, and fever. One common bacterial cause of gastritis is an organism known as H. Pylori. This can be treated with antibiotics.     REFLUX   TREATMENT There are a number of non-prescription medicines used to treat reflux including: Antacids.  ZANTAC Proton-pump inhibitors: PRILOSEC OR NEXIUM  HOME CARE INSTRUCTIONS Eat 2-3 hours before going  to bed.  Try to reach and maintain a healthy weight. LOSE 10-20 LBS Do not eat just a few very large meals. Instead, eat 4 TO 6 smaller meals throughout the day.  Try to identify foods and beverages that make your symptoms worse, and avoid these.  Avoid tight clothing.  Do not exercise right after eating.   Low-Fat Diet BREADS, CEREALS, PASTA, RICE, DRIED PEAS, AND BEANS These products are high in carbohydrates and most are low in fat. Therefore, they can be  increased in the diet as substitutes for fatty foods. They too, however, contain calories and should not be eaten in excess. Cereals can be eaten for snacks as well as for breakfast.  Include foods that contain fiber (fruits, vegetables, whole grains, and legumes). Research shows that fiber may lower blood cholesterol levels, especially the water-soluble fiber found in fruits, vegetables, oat products, and legumes. FRUITS AND VEGETABLES It is good to eat fruits and vegetables. Besides being sources of fiber, both are rich in vitamins and some minerals. They help you get the daily allowances of these nutrients. Fruits and vegetables can be used for snacks and desserts. MEATS Limit lean meat, chicken, [place], and fish to no more than 6 ounces per day. Beef, Pork, and Lamb Use lean cuts of beef, pork, and lamb. Lean cuts include:  Extra-lean ground beef.  Arm roast.  Sirloin tip.  Center-cut ham.  Round steak.  Loin chops.  Rump roast.  Tenderloin.  Trim all fat off the outside of meats before cooking. It is not necessary to severely decrease the intake of red meat, but lean choices should be made. Lean meat is rich in protein and contains a highly absorbable form of iron. Premenopausal women, in particular, should avoid reducing lean red meat because this could increase the risk for low red blood cells (iron-deficiency anemia).  Chicken and [place] These are good sources of protein. The fat of poultry can be reduced by removing the skin and underlying fat layers before cooking. Chicken and [place] can be substituted for lean red meat in the diet. Poultry should not be fried or covered with high-fat sauces. Fish and Shellfish Fish is a good source of protein. Shellfish contain cholesterol, but they usually are low in saturated fatty acids. The preparation of fish is important. Like chicken and [place], they should not be fried or covered with high-fat sauces. EGGS Egg whites contain no fat or  cholesterol. They can be eaten often. Try 1 to 2 egg whites instead of whole eggs in recipes or use egg substitutes that do not contain yolk.  MILK AND DAIRY PRODUCTS Use skim or 1% milk instead of 2% or whole milk. Decrease whole milk, natural, and processed cheeses. Use nonfat or low-fat (2%) cottage cheese or low-fat cheeses made from vegetable oils. Choose nonfat or low-fat (1 to 2%) yogurt. Experiment with evaporated skim milk in recipes that call for heavy cream. Substitute low-fat yogurt or low-fat cottage cheese for sour cream in dips and salad dressings. Have at least 2 servings of low-fat dairy products, such as 2 glasses of skim (or 1%) milk each day to help get your daily calcium intake.  FATS AND OILS Butterfat, lard, and beef fats are high in saturated fat and cholesterol. These should be avoided.Vegetable fats do not contain cholesterol. AVOID coconut oil, palm oil, and palm kernel oil, WHICH are very high in saturated fats. These should be limited. These fats are often used in [employer], processed foods, popcorn,  oils, and nondairy creamers. Vegetable shortenings and some peanut butters contain hydrogenated oils, which are also saturated fats. Read the labels on these foods and check for saturated vegetable oils.  Desirable liquid vegetable oils are corn oil, cottonseed oil, olive oil, canola oil, safflower oil, soybean oil, and sunflower oil. Peanut oil is not as good, but small amounts are acceptable. Buy a heart-healthy tub margarine that has no partially hydrogenated oils in the ingredients. AVOID Mayonnaise and salad dressings often are made from unsaturated fats.  OTHER EATING TIPS Snacks  Most sweets should be limited as snacks. They tend to be rich in calories and fats, and their caloric content outweighs their nutritional value. Some good choices in snacks are graham crackers, melba toast, soda crackers, bagels (no egg), English muffins, fruits, and vegetables. These snacks  are preferable to snack crackers, [place] fries, and chips. Popcorn should be air-popped or cooked in small amounts of liquid vegetable oil.  Desserts Eat fruit, low-fat yogurt, and fruit ices instead of pastries, cake, and cookies. Sherbet, angel food cake, gelatin dessert, frozen low-fat yogurt, or other frozen products that do not contain saturated fat (pure fruit juice bars, frozen ice pops) are also acceptable.   COOKING METHODS Choose those methods that use little or no fat. They include: Poaching.  Braising.  Steaming.  Grilling.  Baking.  Stir-frying.  Broiling.  Microwaving.  Foods can be cooked in a nonstick pan without added fat, or use a nonfat cooking spray in regular cookware. Limit fried foods and avoid frying in saturated fat. Add moisture to lean meats by using water, broth, cooking wines, and other nonfat or low-fat sauces along with the cooking methods mentioned above. Soups and stews should be chilled after cooking. The fat that forms on top after a few hours in the refrigerator should be skimmed off. When preparing meals, avoid using excess salt. Salt can contribute to raising blood pressure in some people.  EATING AWAY FROM HOME Order entres, potatoes, and vegetables without sauces or butter. When meat exceeds the size of a deck of cards (3 to 4 ounces), the rest can be taken home for another meal. Choose vegetable or fruit salads and ask for low-calorie salad dressings to be served on the side. Use dressings sparingly. Limit high-fat toppings, such as bacon, crumbled eggs, cheese, sunflower seeds, and olives. Ask for heart-healthy tub margarine instead of butter.

## 2015-11-13 NOTE — Transfer of Care (Signed)
Immediate Anesthesia Transfer of Care Note  Patient: Jacqueline [name]  Procedure(s) Performed: Procedure(s): ESOPHAGOGASTRODUODENOSCOPY (EGD) WITH PROPOFOL (N/A)  Patient Location: PACU  Anesthesia Type:MAC  Level of Consciousness: awake and patient cooperative  Airway & Oxygen Therapy: Patient Spontanous Breathing and non-rebreather face mask  Post-op Assessment: Report given to RN, Post -op Vital signs reviewed and stable and Patient moving all extremities  Post vital signs: Reviewed and stable   Complications: No apparent anesthesia complications

## 2015-11-13 NOTE — H&P (Signed)
Primary Care Physician:  Neale Burly, MD Primary Gastroenterologist:  Dr. Oneida Alar  Pre-Procedure History & Physical: HPI:  Jacqueline [name] is a 64 y.o. female here for PUD/epigastric pain.  Past Medical History  Diagnosis Date  . Gastric nodule 2009    EUS, ?leiomyoma  . Irritable bowel syndrome   . History of pancreatitis     Biliary and/or etoh?  Marland Kitchen Anxiety   . Chronic back pain   . GERD (gastroesophageal reflux disease)   . Migraines   . 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 Peripheral neuropathy ([place])   . Knee pain   . Gout   . Migraines   . Drug-seeking behavior   . Polysubstance abuse     opiates, cocaine, marijuana    Past Surgical History  Procedure Laterality Date  . Back surgery /ray cage fusion comberg, gso    . Toe graft    . Cesarean section    . 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  . Tonsillectomy    . Abdominal hysterectomy    . Dilation and curettage of uterus    . Gallbladder surgery  2010  . Eus  12/2008    Dr. Paulita Fujita. Gastric nodule most consistent with Leiomyoma. Slightly thickened and irregular gallbladder wall, possibly due to sludge or diminutive stones. Recommend EUS in January 2011 to followup gastric nodule.  . Esophagogastroduodenoscopy  11/2008    A 9-mm submucosal lesion seen in the cardia., gastritis, no hpylori  . Colonoscopy  2001    Dr. Laural Golden, hemorrhoids  . Cholecystectomy    . Colonoscopy  10/21/2011    [id number] polyp multiple/internal hemorrhoids  . Savory dilation  02/03/2012    [id number] in the distal esophagus/Mild gastritis  . Back surgery    . Abdominal surgery    . Foot surgery    . Esophagogastroduodenoscopy (egd) with propofol N/A 07/31/2015    [id number]  ulcer and moderate gastritis, dysphagia, empirical dilation with no identified source. esophageal, gastric, duodenal bx unremarkable.   . Esophageal dilation N/A 07/31/2015    Procedure: ESOPHAGEAL DILATION WIRE GUIDED WITH SAVORY DILATORS 15MM, 16MM;  Surgeon: Danie Binder, MD;  Location: [facility];  Service: Endoscopy;  Laterality: N/A;  . Esophageal biopsy  07/31/2015    Procedure: BIOPSY (DUODENAL, GASTRIC, GASTRIC ULCER, ESOPHAGEAL);  Surgeon: Danie Binder, MD;  Location: [facility];  Service: Endoscopy;;    Prior to Admission medications   Medication Sig Start Date End Date Taking? Authorizing Provider  acetaminophen (TYLENOL) 500 MG tablet Take 1,000 mg by mouth every 6 (six) hours as needed for mild pain.   Yes Historical Provider, MD  celecoxib (CELEBREX) 200 MG capsule Take 1 capsule (200 mg total) by mouth 2 (two) times daily. 07/31/15  Yes Danie Binder, MD  FLUoxetine (PROZAC) 20 MG capsule Take 1 capsule (20 mg total) by mouth daily. 04/30/15  Yes Niel Hummer, NP  gabapentin (NEURONTIN) 300 MG capsule Take 2 capsules (600 mg total) by mouth 3 (three) times daily. 04/30/15  Yes Niel Hummer, NP  methocarbamol (ROBAXIN) 750 MG tablet Take 1 tablet (750 mg total) by mouth 4 (four) times daily. 04/30/15  Yes Niel Hummer, NP  ondansetron [facility])  4 MG tablet Take 1 tablet (4 mg total) by mouth every 8 (eight) hours as needed for nausea or vomiting. 10/01/15  Yes Mahala Menghini, PA-C  pantoprazole (PROTONIX) 40 MG tablet Take 1 tablet (40 mg total) by mouth 2 (two) times daily before a meal. 10/01/15  Yes Mahala Menghini, PA-C  sucralfate (CARAFATE) 1 GM/10ML suspension Take 10 mLs (1 g total) by mouth 4 (four) times daily -  with meals and at bedtime. 10/01/15  Yes Mahala Menghini, PA-C  traMADol (ULTRAM) 50 MG tablet Take 1 tablet (50 mg total) by mouth every 6 (six) hours as needed for moderate pain. 04/30/15  Yes Niel Hummer, NP  traZODone (DESYREL) 50 MG tablet Take 1 tablet (50 mg total) by  mouth at bedtime and may repeat dose one time if needed. 04/30/15  Yes Niel Hummer, NP    Allergies as of 10/01/2015 - Review Complete 10/01/2015  Allergen Reaction Noted  . Aspirin Other (See Comments)   . Imitrex [sumatriptan base] Other (See Comments) 09/30/2011  . Ketorolac tromethamine Other (See Comments) 09/30/2011  . Nsaids Other (See Comments) 12/18/2011  . Promethazine hcl Other (See Comments) 12/18/2011  . Sulfonamide derivatives Nausea And Vomiting     Family History  Problem Relation Age of Onset  . Colon cancer Mother     diagnosed in late 70s and died age 62  . Heart failure Mother   . Heart defect      family history   . Arthritis      family history  . COPD      family history   . Cancer      multiple unknown type  . Heart attack Father     deceased at 104  . Hypertension Father   . Heart failure Father   . Lung cancer Maternal Uncle   . Throat cancer Maternal Uncle   . Colon cancer Paternal Uncle   . Colon cancer Maternal Aunt   . Colon cancer Other   . Anesthesia problems Neg Hx   . Hypotension Neg Hx   . Malignant hyperthermia Neg Hx   . Pseudochol deficiency Neg Hx     Social History   Social History  . Marital Status: Divorced    Spouse Name: N/A  . Number of Children: 2  . Years of Education: 10th grade   Occupational History  . disabled: back problems     Social History Main Topics  . Smoking status: Current Every Day Smoker -- 0.25 packs/day for 35 years    Types: Cigarettes  . Smokeless tobacco: Never Used  . Alcohol Use: Yes     Comment: occasional holiday drink... pt denied alcohol use on admission to [facility]  . Drug Use: Yes    Special: Marijuana, Cocaine     Comment: pt takes a "puff" of marijuana if she hasn't had anything for nausea-last use 2 months ago, denies any use today 06/18/2014  . Sexual Activity: Not on file   Other Topics Concern  . Not on file   Social History Narrative    Review of Systems: See HPI,  otherwise negative ROS   Physical Exam: Temp(Src) 97.6 F (36.4 C) (Oral) General:   Alert,  pleasant and cooperative in NAD Head:  Normocephalic and atraumatic. Neck:  Supple; Lungs:  Clear throughout to auscultation.    Heart:  Regular rate and rhythm. Abdomen:  Soft, nontender and nondistended. Normal bowel sounds, without guarding, and without rebound.  Neurologic:  Alert and  oriented x4;  grossly normal neurologically.  Impression/Plan:    PUD/epigastric pain PLAN:  REPEAT EGD TO CONFIRM ULCERS ARE HEALED.

## 2015-11-13 NOTE — Op Note (Signed)
[facility] [address] [place], [address]   ENDOSCOPY PROCEDURE REPORT  PATIENT: Jacqueline, [name]  MR#: [id number] BIRTHDATE: [birth year] , 53  yrs. old GENDER: female  ENDOSCOPIST: Danie Binder, MD REFERRED [id number] Hasani, M.D.  PROCEDURE DATE: 12/12/15 PROCEDURE:   EGD, diagnostic  INDICATIONS:screening for ULCERS, EPIGASTRIC PAIN. MEDICATIONS: Monitored anesthesia care TOPICAL ANESTHETIC:   Viscous Xylocaine ASA CLASS:  DESCRIPTION OF PROCEDURE:     Physical exam was performed.  Informed consent was obtained from the patient after explaining the benefits, risks, and alternatives to the procedure.  The patient was connected to the monitor and placed in the left lateral position.  Continuous oxygen was provided by nasal cannula and IV medicine administered through an indwelling cannula.  After administration of sedation, the patients esophagus was intubated and the     endoscope was advanced under direct visualization to the second portion of the duodenum.  The scope was removed slowly by carefully examining the color, texture, anatomy, and integrity of the mucosa on the way out.  The patient was recovered in endoscopy and discharged home in satisfactory condition.  Estimated blood loss is zero unless otherwise noted in this procedure report.     ESOPHAGUS: The mucosa of the esophagus appeared normal.   STOMACH: SINGLE NON-BLEEDING GASTRIC AVMs.   Mild non-erosive gastritis (inflammation) was found in the gastric body and gastric antrum. DUODENUM: The duodenal mucosa showed no abnormalities in the bulb and 2nd part of the duodenum. COMPLICATIONS: There were no immediate complications.  ENDOSCOPIC IMPRESSION: 1.   GASTRIC ULCER HAS HEALED 2.   SINGLE NON-BLEEDING GASTRIC AVMs 3.   MILD Non-erosive gastritis  RECOMMENDATIONS: FOLLOW A LOW FAT DIET.  SEE INFO BELOW. TAKE PROTONIX 30 MINUTES PRIOR TO MEALS TWICE DAILY. SEE [facility] DO[facility] AT [facility]  FOR DYSPHAGIA/ABDOMINAL PAIN. FOLLOW UP IN 4 MOS.  REPEAT EXAM: eSigned:  Danie Binder, MD 2015/12/12 4:20 PM    CPT CODES: ICD CODES:  The ICD and CPT codes recommended by this software are interpretations from the data that the clinical staff has captured with the software.  The verification of the translation of this report to the ICD and CPT codes and modifiers is the sole responsibility of the health care institution and practicing physician where this report was generated.  [place]. will not be held responsible for the validity of the ICD and CPT codes included on this report.  AMA assumes no liability for data contained or not contained herein. CPT is a Designer, television/film set of the [employer].

## 2015-11-14 ENCOUNTER — Encounter (HOSPITAL_COMMUNITY): Payer: Self-pay | Admitting: Gastroenterology

## 2016-03-12 ENCOUNTER — Ambulatory Visit: Payer: Self-pay | Admitting: Gastroenterology

## 2016-05-04 ENCOUNTER — Emergency Department (HOSPITAL_COMMUNITY)
Admission: EM | Admit: 2016-05-04 | Discharge: 2016-05-04 | Disposition: A | Discharge status: Home / Self Care | Payer: Medicaid Other | Attending: Emergency Medicine | Admitting: Emergency Medicine

## 2016-05-04 ENCOUNTER — Encounter (HOSPITAL_COMMUNITY): Payer: Self-pay | Admitting: Emergency Medicine

## 2016-05-04 DIAGNOSIS — Z79899 Other long term (current) drug therapy: Secondary | ICD-10-CM | POA: Insufficient documentation

## 2016-05-04 DIAGNOSIS — I83812 Varicose veins of left lower extremities with pain: Secondary | ICD-10-CM | POA: Diagnosis not present

## 2016-05-04 DIAGNOSIS — M79662 Pain in left lower leg: Secondary | ICD-10-CM | POA: Diagnosis present

## 2016-05-04 DIAGNOSIS — F17210 Nicotine dependence, cigarettes, uncomplicated: Secondary | ICD-10-CM | POA: Diagnosis not present

## 2016-05-04 MED ORDER — ACETAMINOPHEN 325 MG PO TABS
650.0000 mg | ORAL_TABLET | ORAL | Status: DC | PRN
  Administered 2016-05-04: 650 mg via ORAL
  Filled 2016-05-04: qty 2

## 2016-05-04 NOTE — ED Notes (Signed)
Pt discharged per md instruct- Verbalizes understanding of follow up with Vascular, compression hose for comfort as well as elevation, ice and return if worse. Ambulated with slight limp to exit

## 2016-05-04 NOTE — ED Notes (Signed)
Pt reports Hit knee about 1600, Then noticed stinging to her left shin area around her varicose veins. Took her Neurontin, tramadol 100 mg, and robaxin without the stinging being relieved. Here to the hospital for evaluation. Reports cig usage 1/2 ppd. She Has a palpable pulse to her left foot

## 2016-05-04 NOTE — ED Notes (Signed)
EDP in to assess 

## 2016-05-04 NOTE — ED Provider Notes (Signed)
CSN: [id number]     Arrival date & time 05/04/16  1936 History  By signing my name below, I, Jacqueline [name], attest that this documentation has been prepared under the direction and in the presence of No att. providers found.   Electronically Signed: Nicole [name], ED Scribe. 05/04/2016. 8:53 PM   Chief Complaint  Patient presents with  . Leg Pain    Patient is a 65 y.o. female presenting with leg pain. The history is provided by the patient. No language interpreter was used.  Leg Pain Location:  Leg Time since incident:  4 hours Injury: yes   Mechanism of injury comment:  Hit leg on door Leg location:  L leg and L lower leg Pain details:    Quality:  Unable to specify   Radiates to:  Does not radiate   Severity:  Mild   Onset quality:  Sudden   Duration:  4 hours   Timing:  Constant   Progression:  Unable to specify Chronicity:  New Dislocation: no   Foreign body present:  No foreign bodies Relieved by:  Nothing  HPI Comments: Jacqueline [name] is a 65 y.o. female who presents to the Emergency Department complaining of sudden onset, left leg pain, onset earlier today around 4 PM. Pt states she "has bad varicose veins" and she hit one of her veins on a door earlier today. She notes associated lower left leg ecchymosis and lower left leg hematoma. No other associated symptoms noted. Pt has taken tramadol PTA with no relief to her symptoms. No other worsening or alleviating factors noted. Pt denies numbness, tingling, weakness, injury to other areas, or any other pertinent symptoms.   Past Medical History  Diagnosis Date  . Gastric nodule 2009    EUS, ?leiomyoma  . Irritable bowel syndrome   . History of pancreatitis     Biliary and/or etoh?  Marland Kitchen Anxiety   . Chronic back pain   . GERD (gastroesophageal reflux disease)   . Migraines   . 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  Peripheral neuropathy ([place])   . Knee pain   . Gout   . Migraines   . Drug-seeking behavior   . Polysubstance abuse     opiates, cocaine, marijuana   Past Surgical History  Procedure Laterality Date  . Back surgery /ray cage fusion comberg, gso    . Toe graft    . Cesarean section    . 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  . Tonsillectomy    . Abdominal hysterectomy    . Dilation and curettage of uterus    . Gallbladder surgery  2010  . Eus  12/2008    Dr. Paulita Fujita. Gastric nodule most consistent with Leiomyoma. Slightly thickened and irregular gallbladder wall, possibly due to sludge or diminutive stones. Recommend EUS in January 2011 to followup gastric nodule.  . Esophagogastroduodenoscopy  11/2008    A 9-mm submucosal lesion seen in the cardia., gastritis, no hpylori  . Colonoscopy  2001    Dr. Laural Golden, hemorrhoids  . Cholecystectomy    . Colonoscopy  10/21/2011    [id number] polyp multiple/internal hemorrhoids  . Savory dilation  02/03/2012    [id number] in the distal esophagus/Mild gastritis  .  Back surgery    . Abdominal surgery    . Foot surgery    . Esophagogastroduodenoscopy (egd) with propofol N/A 07/31/2015    [id number] ulcer and moderate gastritis, dysphagia, empirical dilation with no identified source. esophageal, gastric, duodenal bx unremarkable.   . Esophageal dilation N/A 07/31/2015    Procedure: ESOPHAGEAL DILATION WIRE GUIDED WITH SAVORY DILATORS 15MM, 16MM;  Surgeon: Danie Binder, MD;  Location: [facility];  Service: Endoscopy;  Laterality: N/A;  . Biopsy  07/31/2015    Procedure: BIOPSY (DUODENAL, GASTRIC, GASTRIC ULCER, ESOPHAGEAL);  Surgeon: Danie Binder, MD;  Location: [facility];  Service: Endoscopy;;  . Esophagogastroduodenoscopy (egd) with propofol N/A 11/13/2015    SLF: 1. Gastric ulcer has  healed. 2. single non-bleeding gastric AVMs 3. mild non-erosive gastritis.    Family History  Problem Relation Age of Onset  . Colon cancer Mother     diagnosed in late 50s and died age 34  . Heart failure Mother   . Heart defect      family history   . Arthritis      family history  . COPD      family history   . Cancer      multiple unknown type  . Heart attack Father     deceased at 73  . Hypertension Father   . Heart failure Father   . Lung cancer Maternal Uncle   . Throat cancer Maternal Uncle   . Colon cancer Paternal Uncle   . Colon cancer Maternal Aunt   . Colon cancer Other   . Anesthesia problems Neg Hx   . Hypotension Neg Hx   . Malignant hyperthermia Neg Hx   . Pseudochol deficiency Neg Hx    Social History  Substance Use Topics  . Smoking status: Current Every Day Smoker -- 0.25 packs/day for 35 years    Types: Cigarettes  . Smokeless tobacco: Never Used  . Alcohol Use: Yes     Comment: occasional holiday drink... pt denied alcohol use on admission to [facility]   OB History    Gravida Para Term Preterm AB TAB SAB Ectopic Multiple Living   3 2 2  1  1   2      Review of Systems  Cardiovascular: Negative for leg swelling.  Musculoskeletal: Negative for myalgias.       Left leg pain   Skin: Positive for wound.       Lower eft leg hematoma and ecchymosis noted.   Neurological: Negative for weakness and numbness.  All other systems reviewed and are negative.    Allergies  Aspirin; Imitrex; Ketorolac tromethamine; Nsaids; Promethazine hcl; and Sulfonamide derivatives  Home Medications   Prior to Admission medications   Medication Sig Start Date End Date Taking? Authorizing Provider  gabapentin (NEURONTIN) 300 MG capsule Take 2 capsules (600 mg total) by mouth 3 (three) times daily. 04/30/15  Yes Niel Hummer, NP  acetaminophen (TYLENOL) 500 MG tablet Take 1,000 mg by mouth every 6 (six) hours as needed for mild pain.    Historical Provider, MD   methocarbamol (ROBAXIN) 750 MG tablet Take 1 tablet (750 mg total) by mouth 4 (four) times daily. 04/30/15   Niel Hummer, NP  ondansetron (ZOFRAN) 4 MG tablet Take 1 tablet (4 mg total) by mouth every 8 (eight) hours as needed for nausea or vomiting. 10/01/15   Mahala Menghini, PA-C  pantoprazole (PROTONIX) 40 MG tablet Take 1 tablet (40 mg total) by mouth 2 (  two) times daily before a meal. 10/01/15   Mahala Menghini, PA-C  sucralfate (CARAFATE) 1 GM/10ML suspension Take 10 mLs (1 g total) by mouth 4 (four) times daily -  with meals and at bedtime. 10/01/15   Mahala Menghini, PA-C  traMADol (ULTRAM) 50 MG tablet Take 1 tablet (50 mg total) by mouth every 6 (six) hours as needed for moderate pain. 04/30/15   Niel Hummer, NP  traZODone (DESYREL) 50 MG tablet Take 1 tablet (50 mg total) by mouth at bedtime and may repeat dose one time if needed. 04/30/15   Niel Hummer, NP   BP 122/80 mmHg  Pulse 92  Temp(Src) 98.9 F (37.2 C) (Oral)  Resp 16  Ht 5\' 2"  (1.575 m)  Wt 115 lb (52.164 kg)  BMI 21.03 kg/m2  SpO2 97% Physical Exam  Constitutional: She is oriented to person, place, and time. She appears well-developed and well-nourished. No distress.  HENT:  Head: Normocephalic and atraumatic.  Mouth/Throat: Oropharynx is clear and moist.  Eyes: EOM are normal. Pupils are equal, round, and reactive to light.  Neck: Normal range of motion. Neck supple.  Cardiovascular: Normal rate and regular rhythm.   Pulmonary/Chest: Effort normal and breath sounds normal. No respiratory distress. She has no wheezes. She has no rales.  Abdominal: Soft. Bowel sounds are normal. She exhibits no distension and no mass. There is no tenderness. There is no rebound and no guarding.  Musculoskeletal: Normal range of motion. She exhibits tenderness. She exhibits no edema.  Patient with left pretibial hematoma. Diffuse varicose veins. No erythema or warmth. No lower extremity asymmetry. 2+ dorsalis pedis and posterior tibial  pulses bilaterally.  Neurological: She is alert and oriented to person, place, and time.  Skin: Skin is warm and dry. No rash noted. No erythema.  Psychiatric: She has a normal mood and affect. Her behavior is normal.  Nursing note and vitals reviewed.   ED Course  Procedures (including critical care time) DIAGNOSTIC STUDIES: Oxygen Saturation is 97% on RA, normal by my interpretation.    COORDINATION OF CARE: 8:03 PM Discussed treatment plan which includes pain management with pt at bedside and pt agreed to plan.  Labs Review Labs Reviewed - No data to display  Imaging Review No results found.   EKG Interpretation None      MDM   Final diagnoses:  Varicose veins of left lower extremity with pain   I personally performed the services described in this documentation, which was scribed in my presence. The recorded information has been reviewed and is accurate.   Advised to wear compression stockings. Given vascular surgery referral. Return precautions given.     Julianne Rice, MD 05/04/16 2053

## 2016-05-04 NOTE — Discharge Instructions (Signed)
Varicose Veins Varicose veins are veins that have become enlarged and twisted. They are usually seen in the legs but can occur in other parts of the body as well. CAUSES This condition is the result of valves in the veins not working properly. Valves in the veins help to return blood from the leg to the heart. If these valves are damaged, blood flows backward and backs up into the veins in the leg near the skin. This causes the veins to become larger. RISK FACTORS People who are on their feet a lot, who are pregnant, or who are overweight are more likely to develop varicose veins. SIGNS AND SYMPTOMS  Bulging, twisted-appearing, bluish veins, most commonly found on the legs.  Leg pain or a feeling of heaviness. These symptoms may be worse at the end of the day.  Leg swelling.  Changes in skin color. DIAGNOSIS A health care provider can usually diagnose varicose veins by examining your legs. Your health care provider may also recommend an ultrasound of your leg veins. TREATMENT Most varicose veins can be treated at home.However, other treatments are available for people who have persistent symptoms or want to improve the cosmetic appearance of the varicose veins. These treatment options include:  Sclerotherapy. A solution is injected into the vein to close it off.  Laser treatment. A laser is used to heat the vein to close it off.  Radiofrequency vein ablation. An electrical current produced by radio waves is used to close off the vein.  Phlebectomy. The vein is surgically removed through small incisions made over the varicose vein.  Vein ligation and stripping. The vein is surgically removed through incisions made over the varicose vein after the vein has been tied (ligated). HOME CARE INSTRUCTIONS  Do not stand or sit in one position for long periods of time. Do not sit with your legs crossed. Rest with your legs raised during the day.  Wear compression stockings as directed by your  health care provider. These stockings help to prevent blood clots and reduce swelling in your legs.  Do not wear other tight, encircling garments around your legs, pelvis, or waist.  Walk as much as possible to increase blood flow.  Raise the foot of your bed at night with 2-inch blocks.  If you get a cut in the skin over the vein and the vein bleeds, lie down with your leg raised and press on it with a clean cloth until the bleeding stops. Then place a bandage (dressing) on the cut. See your health care provider if it continues to bleed. SEEK MEDICAL CARE IF:  The skin around your ankle starts to break down.  You have pain, redness, tenderness, or hard swelling in your leg over a vein.  You are uncomfortable because of leg pain.   This information is not intended to replace advice given to you by your health care provider. Make sure you discuss any questions you have with your health care provider.   Document Released: 09/17/2005 Document Revised: 12/29/2014 Document Reviewed: 04/25/2014 Elsevier Interactive Patient Education 2016 Elsevier Inc.  

## 2016-05-04 NOTE — ED Notes (Signed)
Patient complaining of pain and swelling noted to lower left leg starting today. States "I have varicose veins really bad and I hit this big one on my leg and it hurts. But this knot on my leg moved when I took off my compression hose that I wear."

## 2016-05-22 ENCOUNTER — Telehealth: Payer: Self-pay | Admitting: Gastroenterology

## 2016-05-22 MED ORDER — ONDANSETRON HCL 4 MG PO TABS: 4.0000 mg | ORAL_TABLET | Freq: Three times a day (TID) | ORAL | Status: DC | PRN

## 2016-05-22 NOTE — Telephone Encounter (Signed)
I called pt and she said her urine was pumpkin color about a day and a half, but it is better now. I told her that she should see her PCP for any urinary concerns and she said OK.  Said she is also having some queziness and could use some Zofran.  Please advise!

## 2016-05-22 NOTE — Telephone Encounter (Signed)
Zofran refill sent to pharmacy

## 2016-05-22 NOTE — Telephone Encounter (Signed)
LMOM that RX was sent to the pharmacy.

## 2016-05-22 NOTE — Telephone Encounter (Signed)
Pt is aware of her OV on 6/14 and had concerns about the color of her urine being "pumpkin color".  I told her that if we had any cancellations I could try to move her OV up and she asked if the nurse would call her back. [id number]

## 2016-05-22 NOTE — Addendum Note (Signed)
Addended by: Gordy Levan, ERIC A on: 05/22/2016 04:06 PM   Modules accepted: Orders

## 2016-05-27 ENCOUNTER — Ambulatory Visit: Payer: Self-pay | Admitting: Nurse Practitioner

## 2016-06-04 ENCOUNTER — Encounter: Payer: Self-pay | Admitting: Gastroenterology

## 2016-06-04 ENCOUNTER — Telehealth: Payer: Self-pay | Admitting: Gastroenterology

## 2016-06-04 ENCOUNTER — Ambulatory Visit: Payer: Self-pay | Admitting: Gastroenterology

## 2016-06-04 NOTE — Telephone Encounter (Signed)
PATIENT WAS A NO SHOW AND LETTER SENT  °

## 2016-06-19 ENCOUNTER — Other Ambulatory Visit: Payer: Self-pay | Admitting: *Deleted

## 2016-06-19 DIAGNOSIS — I83892 Varicose veins of left lower extremities with other complications: Secondary | ICD-10-CM

## 2016-08-14 ENCOUNTER — Telehealth: Payer: Self-pay | Admitting: Vascular Surgery

## 2016-08-14 NOTE — Telephone Encounter (Signed)
In order for Medicaid approval for the ultrasound, I called the patient to verify the  appointment with her.  She is aware of the appointment and will be able to keep it.

## 2016-08-19 ENCOUNTER — Encounter (HOSPITAL_COMMUNITY): Payer: Self-pay

## 2016-08-19 ENCOUNTER — Encounter: Payer: Self-pay | Admitting: Vascular Surgery

## 2016-08-19 ENCOUNTER — Emergency Department (HOSPITAL_COMMUNITY)
Admission: EM | Admit: 2016-08-19 | Discharge: 2016-08-19 | Disposition: A | Discharge status: Home / Self Care | Payer: Medicaid Other | Attending: Emergency Medicine | Admitting: Emergency Medicine

## 2016-08-19 DIAGNOSIS — F17210 Nicotine dependence, cigarettes, uncomplicated: Secondary | ICD-10-CM | POA: Insufficient documentation

## 2016-08-19 DIAGNOSIS — Z79899 Other long term (current) drug therapy: Secondary | ICD-10-CM | POA: Diagnosis not present

## 2016-08-19 DIAGNOSIS — R21 Rash and other nonspecific skin eruption: Secondary | ICD-10-CM

## 2016-08-19 DIAGNOSIS — L089 Local infection of the skin and subcutaneous tissue, unspecified: Secondary | ICD-10-CM | POA: Insufficient documentation

## 2016-08-19 MED ORDER — DOXYCYCLINE HYCLATE 100 MG PO CAPS: 100.0000 mg | ORAL_CAPSULE | Freq: Two times a day (BID) | ORAL | 0 refills | Status: DC

## 2016-08-19 NOTE — ED Provider Notes (Signed)
[facility] DEPT Provider Note   CSN: [id number] Arrival date & time: 08/19/16  0813     History   Chief Complaint Chief Complaint  Patient presents with  . Rash    HPI Jacqueline [name] is a 65 y.o. female.  The history is provided by the patient. No language interpreter was used.  Rash   This is a new problem. The current episode started more than 1 week ago. The problem has been gradually worsening. The problem is associated with nothing. There has been no fever. The rash is present on the trunk, back, right upper leg and right lower leg. The patient is experiencing no pain. The pain has been constant since onset. She has tried nothing for the symptoms.    Past Medical History:  Diagnosis Date  . Anxiety   . Chronic back pain   . Drug-seeking behavior   . Gastric nodule 2009   EUS, ?leiomyoma  . 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 GERD (gastroesophageal reflux disease)   . Gout   . History of pancreatitis    Biliary and/or etoh?  . Irritable bowel syndrome   . Knee pain   . Migraines   . Migraines   . Peripheral neuropathy ([place])   . Polysubstance abuse    opiates, cocaine, marijuana    Patient Active Problem List   Diagnosis Date Noted  . PUD (peptic ulcer disease) 10/01/2015  . Nausea with vomiting 10/01/2015  . Loss of weight 10/01/2015  . Dizzy 07/16/2015  . Major depressive disorder, recurrent, severe without psychotic features ([place])   . Substance induced mood disorder ([place]) 04/24/2015  . Opioid type dependence, continuous ([place]) 04/24/2015  . Severe major depression without psychotic features ([place]) 04/24/2015  . PTSD (post-traumatic stress disorder) 04/24/2015  . Chest pain, localized 04/28/2012  . Odynophagia 09/30/2011  . Colon cancer screening 09/30/2011  . Gastric tumor 09/30/2011  . CLOSED FRACTURE OF UPPER END OF FIBULA 07/31/2010  . Esophageal  dysphagia 07/17/2009  . ABDOMINAL PAIN, GENERALIZED 07/17/2009  . GASTRITIS 07/12/2009  . IBS 07/12/2009  . RUQ PAIN 07/12/2009  . EPIGASTRIC PAIN 07/12/2009  . PANCREATITIS, ACUTE, HX OF 07/12/2009  . Osteoarthrosis, unspecified whether generalized or localized, hand 11/09/2008  . OSTEOARTHRITIS, LOWER LEG 10/25/2008  . DERANGEMENT MENISCUS 10/16/2008  . KNEE PAIN 10/16/2008    Past Surgical History:  Procedure Laterality Date  . ABDOMINAL HYSTERECTOMY    . ABDOMINAL SURGERY    . BACK SURGERY    . back surgery /ray cage fusion comberg, gso    . BIOPSY  07/31/2015   Procedure: BIOPSY (DUODENAL, GASTRIC, GASTRIC ULCER, ESOPHAGEAL);  Surgeon: Danie Binder, MD;  Location: [facility];  Service: Endoscopy;;  . CESAREAN SECTION    . CHOLECYSTECTOMY    . COLONOSCOPY  2001   Dr. Laural Golden, hemorrhoids  . COLONOSCOPY  10/21/2011   [id number] polyp multiple/internal hemorrhoids  . DILATION AND CURETTAGE OF UTERUS    . ESOPHAGEAL DILATION N/A 07/31/2015   Procedure: ESOPHAGEAL DILATION WIRE GUIDED WITH SAVORY DILATORS 15MM, 16MM;  Surgeon: Danie Binder, MD;  Location: [facility];  Service: Endoscopy;  Laterality: N/A;  . ESOPHAGOGASTRODUODENOSCOPY  11/2008   A 9-mm submucosal lesion seen in the cardia., gastritis, no hpylori  . ESOPHAGOGASTRODUODENOSCOPY (EGD) WITH PROPOFOL N/A 07/31/2015   [id number] ulcer and moderate gastritis, dysphagia, empirical dilation with no identified source. esophageal, gastric, duodenal bx  unremarkable.   . ESOPHAGOGASTRODUODENOSCOPY (EGD) WITH PROPOFOL N/A 11/13/2015   SLF: 1. Gastric ulcer has healed. 2. single non-bleeding gastric AVMs 3. mild non-erosive gastritis.   . EUS  12/2008   Dr. Paulita Fujita. Gastric nodule most consistent with Leiomyoma. Slightly thickened and irregular gallbladder wall, possibly due to sludge or diminutive stones. Recommend EUS in January 2011 to followup gastric nodule.  Marland Kitchen FOOT SURGERY    . GALLBLADDER SURGERY  2010  . 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  Marland Kitchen SAVORY DILATION  02/03/2012   [id number] in the distal esophagus/Mild gastritis  . toe graft    . TONSILLECTOMY      OB History    Gravida Para Term Preterm AB Living   3 2 2   1 2    SAB TAB Ectopic Multiple Live Births   1               Home Medications    Prior to Admission medications   Medication Sig Start Date End Date Taking? Authorizing Provider  acetaminophen (TYLENOL) 500 MG tablet Take 1,000 mg by mouth every 6 (six) hours as needed for mild pain.    Historical Provider, MD  doxycycline (VIBRAMYCIN) 100 MG capsule Take 1 capsule (100 mg total) by mouth 2 (two) times daily. 08/19/16   Fransico Meadow, PA-C  gabapentin (NEURONTIN) 300 MG capsule Take 2 capsules (600 mg total) by mouth 3 (three) times daily. 04/30/15   Niel Hummer, NP  methocarbamol (ROBAXIN) 750 MG tablet Take 1 tablet (750 mg total) by mouth 4 (four) times daily. 04/30/15   Niel Hummer, NP  ondansetron (ZOFRAN) 4 MG tablet Take 1 tablet (4 mg total) by mouth every 8 (eight) hours as needed for nausea or vomiting. 05/22/16   Carlis Stable, NP  pantoprazole (PROTONIX) 40 MG tablet Take 1 tablet (40 mg total) by mouth 2 (two) times daily before a meal. 10/01/15   Mahala Menghini, PA-C  sucralfate (CARAFATE) 1 GM/10ML suspension Take 10 mLs (1 g total) by mouth 4 (four) times daily -  with meals and at bedtime. 10/01/15   Mahala Menghini, PA-C  traMADol (ULTRAM) 50 MG tablet Take 1 tablet (50 mg total) by mouth every 6 (six) hours as needed for moderate pain. 04/30/15   Niel Hummer, NP  traZODone (DESYREL) 50 MG tablet Take 1 tablet (50 mg total) by mouth at bedtime and may repeat dose one time if needed. 04/30/15   Niel Hummer, NP    Family History Family History  Problem Relation Age of Onset  . Colon cancer Other   . Colon cancer  Mother     diagnosed in late 30s and died age 68  . Heart failure Mother   . Heart defect      family history   . Arthritis      family history  . COPD      family history   . Cancer      multiple unknown type  . Heart attack Father     deceased at 8  . Hypertension Father   . Heart failure Father   . Lung cancer Maternal Uncle   . Throat cancer Maternal Uncle   . Colon cancer Paternal Uncle   . Colon cancer Maternal Aunt   .  Anesthesia problems Neg Hx   . Hypotension Neg Hx   . Malignant hyperthermia Neg Hx   . Pseudochol deficiency Neg Hx     Social History Social History  Substance Use Topics  . Smoking status: Current Every Day Smoker    Packs/day: 0.25    Years: 35.00    Types: Cigarettes  . Smokeless tobacco: Never Used  . Alcohol use Yes     Comment: occasional holiday drink... pt denied alcohol use on admission to [facility]     Allergies   Aspirin; Imitrex [sumatriptan base]; Ketorolac tromethamine; Nsaids; Promethazine hcl; and Sulfonamide derivatives   Review of Systems Review of Systems  Skin: Positive for rash.  All other systems reviewed and are negative.    Physical Exam Updated Vital Signs BP 152/91 (BP Location: Left Arm)   Pulse 82   Temp 98.4 F (36.9 C) (Oral)   Resp 18   Ht 5\' 2"  (1.575 m)   Wt 54 kg   SpO2 97%   BMI 21.77 kg/m   Physical Exam  Constitutional: She appears well-developed and well-nourished. No distress.  HENT:  Head: Normocephalic and atraumatic.  Eyes: Conjunctivae are normal.  Neck: Neck supple.  Cardiovascular: Normal rate and regular rhythm.   No murmur heard. Pulmonary/Chest: Effort normal and breath sounds normal. No respiratory distress.  Abdominal: Soft. There is no tenderness.  Musculoskeletal: She exhibits no edema.  Neurological: She is alert.  Skin: Skin is warm and dry.  Multiple red raised pimples inner thighs abdomen and back.  Small pustules center  Psychiatric: She has a normal mood and affect.   Nursing note and vitals reviewed.    ED Treatments / Results  Labs (all labs ordered are listed, but only abnormal results are displayed) Labs Reviewed - No data to display  EKG  EKG Interpretation None       Radiology No results found.  Procedures Procedures (including critical care time)  Medications Ordered in ED Medications - No data to display   Initial Impression / Assessment and Plan / ED Course  I have reviewed the triage vital signs and the nursing notes.  Pertinent labs & imaging results that were available during my care of the patient were reviewed by me and considered in my medical decision making (see chart for details).  Clinical Course    Rash looks like a folliculitis.  Pt given rx for doxycycline.  No burrows,   Final Clinical Impressions(s) / ED Diagnoses   Final diagnoses:  Rash  Skin infection    New Prescriptions New Prescriptions   DOXYCYCLINE (VIBRAMYCIN) 100 MG CAPSULE    Take 1 capsule (100 mg total) by mouth 2 (two) times daily.     Hollace Kinnier [place], PA-C 08/19/16 0848    Elnora Morrison, MD 08/19/16 [phone]

## 2016-08-19 NOTE — ED Triage Notes (Signed)
Pt reports rash to inside of legs since Sunday.  Reports her daughter recently changed their laundry detergent.

## 2016-08-19 NOTE — Discharge Instructions (Signed)
See your Physician for recheck in 1 week if symptoms persist  °

## 2016-08-28 ENCOUNTER — Encounter: Payer: Self-pay | Admitting: Vascular Surgery

## 2016-08-28 ENCOUNTER — Encounter (HOSPITAL_COMMUNITY): Payer: Self-pay

## 2016-09-15 ENCOUNTER — Telehealth: Payer: Self-pay

## 2016-09-15 NOTE — Telephone Encounter (Signed)
Pt is aware.  

## 2016-09-15 NOTE — Telephone Encounter (Signed)
Pt called and said she has been having some abdominal pain in her whole belly for several months.  Some nausea and occasionally some vomiting. She has an appt scheduled to see Neil Crouch, PA on 09/26/2016 at 11:00 Am. ( she was last seen in OCT 2016). She has problems with constipation most of the time and only has a BM about every 2-3 days and sometimes hard and difficult to pass. She had a lot of gas yesterday and diarrhea and she passed some bright red mucous. This has just occurred one time. Please advise!

## 2016-09-15 NOTE — Telephone Encounter (Signed)
Unfortunately patient no showed her last visit. We have not seen her in nearly one year. She needs to be evaluated before prescription medication provided.  Would advise MiraLAX 1 capful daily as needed for constipation, may use 2 doses per day until regular bowel movements. Hold for diarrhea. Call if recurrent rectal bleeding. Suspect bleeding from benign anorectal source such as small fissure or hemorrhoids. Please keep upcoming appointment.

## 2016-09-26 ENCOUNTER — Encounter: Payer: Self-pay | Admitting: Gastroenterology

## 2016-09-26 ENCOUNTER — Ambulatory Visit (INDEPENDENT_AMBULATORY_CARE_PROVIDER_SITE_OTHER): Payer: Medicaid Other | Admitting: Gastroenterology

## 2016-09-26 VITALS — BP 115/71 | HR 63 | Temp 98.0°F | Ht 62.0 in | Wt 118.2 lb

## 2016-09-26 DIAGNOSIS — R1013 Epigastric pain: Secondary | ICD-10-CM | POA: Diagnosis not present

## 2016-09-26 DIAGNOSIS — K219 Gastro-esophageal reflux disease without esophagitis: Secondary | ICD-10-CM

## 2016-09-26 DIAGNOSIS — K581 Irritable bowel syndrome with constipation: Secondary | ICD-10-CM | POA: Diagnosis not present

## 2016-09-26 DIAGNOSIS — R112 Nausea with vomiting, unspecified: Secondary | ICD-10-CM | POA: Diagnosis not present

## 2016-09-26 MED ORDER — ONDANSETRON 4 MG PO TBDP: 4.0000 mg | ORAL_TABLET | Freq: Three times a day (TID) | ORAL | 0 refills | Status: DC | PRN

## 2016-09-26 MED ORDER — LINACLOTIDE 145 MCG PO CAPS: 145.0000 ug | ORAL_CAPSULE | Freq: Every day | ORAL | 5 refills | Status: DC

## 2016-09-26 MED ORDER — OMEPRAZOLE 20 MG PO CPDR: 20.0000 mg | DELAYED_RELEASE_CAPSULE | Freq: Two times a day (BID) | ORAL | 3 refills | Status: DC

## 2016-09-26 NOTE — Progress Notes (Signed)
Primary Care Physician:  Neale Burly, MD  Primary Gastroenterologist:  Barney Drain, MD   Chief Complaint  Patient presents with  . Nausea  . Emesis    HPI:  Jacqueline [name] is a 65 y.o. female here for follow up. Last seen 09/2015. 07/31/2015, esophagus appeared normal. Biopsies negative for his eosinophilic esophagitis. Impaired dilation performed. Irregular shaped and clean-based ulcer seen in the gastric body. Biopsy benign. Moderate nonerosive gastritis. Duodenal biopsies benign. EGD 10/2015 with healed gastric ulcer, nonbleeding gastric AVM.   Patient states she is constantly nauseated. Sometimes vomiting. alternating constipation and diarrhea but predominantly constipation. No melena. No brbpr. Reports weight loss of undisclosed amount. Weight stable per EPIC. She has had a lot of heartburn since running out of protonix few weeks ago. Prefers refill on prilosec. C/o some epig pain worse with meals.    Current Outpatient Prescriptions  Medication Sig Dispense Refill  . acetaminophen (TYLENOL) 500 MG tablet Take 1,000 mg by mouth every 6 (six) hours as needed for mild pain.    Marland Kitchen doxycycline (VIBRAMYCIN) 100 MG capsule Take 1 capsule (100 mg total) by mouth 2 (two) times daily. 20 capsule 0  . gabapentin (NEURONTIN) 300 MG capsule Take 2 capsules (600 mg total) by mouth 3 (three) times daily. 180 capsule 0  . methocarbamol (ROBAXIN) 750 MG tablet Take 1 tablet (750 mg total) by mouth 4 (four) times daily.    . ondansetron (ZOFRAN) 4 MG tablet Take 1 tablet (4 mg total) by mouth every 8 (eight) hours as needed for nausea or vomiting. 30 tablet 1  . pantoprazole (PROTONIX) 40 MG tablet Take 1 tablet (40 mg total) by mouth 2 (two) times daily before a meal. 60 tablet 5  . sucralfate (CARAFATE) 1 GM/10ML suspension Take 10 mLs (1 g total) by mouth 4 (four) times daily -  with meals and at bedtime. 420 mL 0  . traMADol (ULTRAM) 50 MG tablet Take 1 tablet (50 mg total) by mouth every 6  (six) hours as needed for moderate pain. 30 tablet 0  . traZODone (DESYREL) 50 MG tablet Take 1 tablet (50 mg total) by mouth at bedtime and may repeat dose one time if needed. 60 tablet 0   No current facility-administered medications for this visit.     Allergies as of 09/26/2016 - Review Complete 09/26/2016  Allergen Reaction Noted  . Aspirin Other (See Comments)   . Imitrex [sumatriptan base] Other (See Comments) 09/30/2011  . Ketorolac tromethamine Other (See Comments) 09/30/2011  . Nsaids Other (See Comments) 12/18/2011  . Promethazine hcl Other (See Comments) 12/18/2011  . Sulfonamide derivatives Nausea And Vomiting     Past Medical History:  Diagnosis Date  . Anxiety   . Chronic back pain   . Drug-seeking behavior   . Gastric nodule 2009   EUS, ?leiomyoma  . 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 GERD (gastroesophageal reflux disease)   . Gout   . History of pancreatitis    Biliary and/or etoh?  . Irritable bowel syndrome   . Knee pain   . Migraines   . Migraines   . Peripheral neuropathy ([place])   . Polysubstance abuse    opiates, cocaine, marijuana    Past Surgical History:  Procedure Laterality Date  . ABDOMINAL HYSTERECTOMY    . ABDOMINAL SURGERY    . BACK SURGERY    . back surgery /  ray cage fusion comberg, gso    . BIOPSY  07/31/2015   Procedure: BIOPSY (DUODENAL, GASTRIC, GASTRIC ULCER, ESOPHAGEAL);  Surgeon: Danie Binder, MD;  Location: [facility];  Service: Endoscopy;;  . CESAREAN SECTION    . CHOLECYSTECTOMY    . COLONOSCOPY  2001   Dr. Laural Golden, hemorrhoids  . COLONOSCOPY  10/21/2011   [id number] polyp multiple/internal hemorrhoids  . DILATION AND CURETTAGE OF UTERUS    . ESOPHAGEAL DILATION N/A 07/31/2015   Procedure: ESOPHAGEAL DILATION WIRE GUIDED WITH SAVORY DILATORS 15MM, 16MM;  Surgeon: Danie Binder, MD;  Location: [facility];  Service: Endoscopy;  Laterality:  N/A;  . ESOPHAGOGASTRODUODENOSCOPY  11/2008   A 9-mm submucosal lesion seen in the cardia., gastritis, no hpylori  . ESOPHAGOGASTRODUODENOSCOPY (EGD) WITH PROPOFOL N/A 07/31/2015   [id number] ulcer and moderate gastritis, dysphagia, empirical dilation with no identified source. esophageal, gastric, duodenal bx unremarkable.   . ESOPHAGOGASTRODUODENOSCOPY (EGD) WITH PROPOFOL N/A 11/13/2015   SLF: 1. Gastric ulcer has healed. 2. single non-bleeding gastric AVMs 3. mild non-erosive gastritis.   . EUS  12/2008   Dr. Paulita Fujita. Gastric nodule most consistent with Leiomyoma. Slightly thickened and irregular gallbladder wall, possibly due to sludge or diminutive stones. Recommend EUS in January 2011 to followup gastric nodule.  Marland Kitchen FOOT SURGERY    . GALLBLADDER SURGERY  2010  . 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  Marland Kitchen SAVORY DILATION  02/03/2012   [id number] in the distal esophagus/Mild gastritis  . toe graft    . TONSILLECTOMY      Family History  Problem Relation Age of Onset  . Colon cancer Other   . Colon cancer Mother     diagnosed in late 36s and died age 36  . Heart failure Mother   . Heart defect      family history   . Arthritis      family history  . COPD      family history   . Cancer      multiple unknown type  . Heart attack Father     deceased at 26  . Hypertension Father   . Heart failure Father   . Lung cancer Maternal Uncle   . Throat cancer Maternal Uncle   . Colon cancer Paternal Uncle   . Colon cancer Maternal Aunt   . Anesthesia problems Neg Hx   . Hypotension Neg Hx   . Malignant hyperthermia Neg Hx   . Pseudochol deficiency Neg Hx     Social History   Social History  . Marital status: Divorced    Spouse name: N/A  . Number of children: 2  . Years of education: 10th grade   Occupational  History  . disabled: back problems     Social History Main Topics  . Smoking status: Current Every Day Smoker    Packs/day: 0.25    Years: 35.00    Types: Cigarettes  . Smokeless tobacco: Never Used  . Alcohol use Yes     Comment: occasional holiday drink... pt denied alcohol use on admission to [facility]  . Drug use:     Types: Marijuana     Comment: +cocaine on 04/2015 UDS  . Sexual activity: Not on file   Other Topics Concern  . Not on file   Social History Narrative  . No  narrative on file      ROS:  General: Negative for fever, chills, fatigue, weakness.see hpi Eyes: Negative for vision changes.  ENT: Negative for hoarseness, difficulty swallowing , nasal congestion. CV: Negative for chest pain, angina, palpitations, dyspnea on exertion, peripheral edema.  Respiratory: Negative for dyspnea at rest, dyspnea on exertion, cough, sputum, wheezing.  GI: See history of present illness. GU:  Negative for dysuria, hematuria, urinary incontinence, urinary frequency, nocturnal urination.  MS: Negative for joint pain, +low back pain.  Derm: Negative for rash or itching.  Neuro: Negative for weakness, abnormal sensation, seizure, frequent headaches, memory loss, confusion.  Psych: Negative for anxiety, depression, suicidal ideation, hallucinations.  Endo: Negative for unusual weight change.  Heme: Negative for bruising or bleeding. Allergy: Negative for rash or hives.    Physical Examination:  BP 115/71   Pulse 63   Temp 98 F (36.7 C) (Oral)   Ht 5\' 2"  (1.575 m)   Wt 118 lb 3.2 oz (53.6 kg)   BMI 21.62 kg/m    General: Well-nourished, well-developed in no acute distress.  Head: Normocephalic, atraumatic.   Eyes: Conjunctiva pink, no icterus. Mouth: Oropharyngeal mucosa moist and pink , no lesions erythema or exudate. Neck: Supple without thyromegaly, masses, or lymphadenopathy.  Lungs: Clear to auscultation bilaterally.  Heart: Regular rate and rhythm, no murmurs rubs or  gallops.  Abdomen: Bowel sounds are normal, mild epig tenderness, nondistended, no hepatosplenomegaly or masses, no abdominal bruits or    hernia , no rebound or guarding.   Rectal: not performed Extremities: No lower extremity edema. No clubbing or deformities.  Neuro: Alert and oriented x 4 , grossly normal neurologically.  Skin: Warm and dry, no rash or jaundice.   Psych: Alert and cooperative, normal mood and affect.  Labs: Lab Results  Component Value Date   WBC 8.0 11/08/2015   HGB 14.2 11/08/2015   HCT 51.0 (H) 11/08/2015   MCV 117.8 (H) 11/08/2015   PLT 217 11/08/2015   Lab Results  Component Value Date   LIPASE 37 07/16/2015   Lab Results  Component Value Date   ALT 4 (L) 07/16/2015   AST 14 07/16/2015   ALKPHOS 74 07/16/2015   BILITOT 0.3 07/16/2015   Lab Results  Component Value Date   CREATININE 0.61 11/08/2015   BUN 8 11/08/2015   NA 138 11/08/2015   K 4.5 11/08/2015   CL 104 11/08/2015   CO2 28 11/08/2015   No results found for: IRON, TIBC, FERRITIN   Imaging Studies: No results found.

## 2016-09-26 NOTE — Patient Instructions (Signed)
1. Please start omeprazole twice daily before breakfast and evening meal.  2. Please start linzess once daily before breakfast for constipation. 3. zofran RX sent to pharmacy for nausea.  4. Please have your labs done.  5. Return to the office in four to six weeks for follow up.

## 2016-09-28 ENCOUNTER — Encounter: Payer: Self-pay | Admitting: Gastroenterology

## 2016-09-28 NOTE — Assessment & Plan Note (Signed)
Recurrent epigastric discomfort, GERD, N/V with h/o gastric ulcer/gastritis/IBS. She has come off her PPI few weeks back. She denies ASA/NSAIDS. She has h/o cocaine use, denies current, +UDS one year ago. ?symptoms secondary to GERD/gastritis/recurrent PUD.  Start omeprazole 20mg  BID. Zofran prn. Treat constipation with Linzess 142mcg daily.   Will check labs. Patient to return in 4-6 weeks for follow up.

## 2016-09-29 ENCOUNTER — Encounter: Payer: Self-pay | Admitting: Gastroenterology

## 2016-09-29 NOTE — Progress Notes (Signed)
cc'ed to pcp °

## 2016-10-28 ENCOUNTER — Emergency Department (HOSPITAL_COMMUNITY)
Admission: EM | Admit: 2016-10-28 | Discharge: 2016-10-28 | Disposition: A | Discharge status: Home / Self Care | Payer: Medicaid Other | Attending: Emergency Medicine | Admitting: Emergency Medicine

## 2016-10-28 ENCOUNTER — Encounter (HOSPITAL_COMMUNITY): Payer: Self-pay | Admitting: Emergency Medicine

## 2016-10-28 DIAGNOSIS — F17210 Nicotine dependence, cigarettes, uncomplicated: Secondary | ICD-10-CM | POA: Diagnosis not present

## 2016-10-28 DIAGNOSIS — Z79899 Other long term (current) drug therapy: Secondary | ICD-10-CM | POA: Diagnosis not present

## 2016-10-28 DIAGNOSIS — L03211 Cellulitis of face: Secondary | ICD-10-CM

## 2016-10-28 DIAGNOSIS — M549 Dorsalgia, unspecified: Secondary | ICD-10-CM | POA: Diagnosis not present

## 2016-10-28 LAB — BASIC METABOLIC PANEL
Anion gap: 5 (ref 5–15)
BUN: 13 mg/dL (ref 6–20)
CALCIUM: 8.9 mg/dL (ref 8.9–10.3)
CO2: 30 mmol/L (ref 22–32)
CREATININE: 0.76 mg/dL (ref 0.44–1.00)
Chloride: 100 mmol/L — ABNORMAL LOW (ref 101–111)
GFR calc Af Amer: >60 mL/min (ref >60)
Glucose, Bld: 110 mg/dL — ABNORMAL HIGH (ref 65–99)
POTASSIUM: 3.4 mmol/L — AB (ref 3.5–5.1)
SODIUM: 135 mmol/L (ref 135–145)

## 2016-10-28 LAB — CBC WITH DIFFERENTIAL/PLATELET
Basophils Absolute: 0 10*3/uL (ref 0.0–0.1)
Basophils Relative: 0 %
EOS ABS: 0.3 10*3/uL (ref 0.0–0.7)
EOS PCT: 3 %
HCT: 36 % (ref 36.0–46.0)
Hemoglobin: 11.8 g/dL — ABNORMAL LOW (ref 12.0–15.0)
LYMPHS ABS: 2 10*3/uL (ref 0.7–4.0)
Lymphocytes Relative: 19 %
MCH: 31.1 pg (ref 26.0–34.0)
MCHC: 32.8 g/dL (ref 30.0–36.0)
MCV: 95 fL (ref 78.0–100.0)
Monocytes Absolute: 0.6 10*3/uL (ref 0.1–1.0)
Monocytes Relative: 6 %
Neutro Abs: 7.8 10*3/uL — ABNORMAL HIGH (ref 1.7–7.7)
Neutrophils Relative %: 72 %
PLATELETS: 246 10*3/uL (ref 150–400)
RBC: 3.79 MIL/uL — AB (ref 3.87–5.11)
RDW: 13.7 % (ref 11.5–15.5)
WBC: 10.8 10*3/uL — AB (ref 4.0–10.5)

## 2016-10-28 MED ORDER — POTASSIUM CHLORIDE CRYS ER 20 MEQ PO TBCR
20.0000 meq | EXTENDED_RELEASE_TABLET | Freq: Once | ORAL | Status: AC
  Administered 2016-10-28: 20 meq via ORAL
  Filled 2016-10-28: qty 1

## 2016-10-28 MED ORDER — HYDROCODONE-ACETAMINOPHEN 5-325 MG PO TABS: 1.0000 | ORAL_TABLET | ORAL | 0 refills | Status: DC | PRN

## 2016-10-28 MED ORDER — HYDROCODONE-ACETAMINOPHEN 5-325 MG PO TABS
2.0000 | ORAL_TABLET | Freq: Once | ORAL | Status: AC
  Administered 2016-10-28: 2 via ORAL
  Filled 2016-10-28: qty 2

## 2016-10-28 MED ORDER — VANCOMYCIN HCL IN DEXTROSE 1-5 GM/200ML-% IV SOLN
1000.0000 mg | Freq: Once | INTRAVENOUS | Status: AC
  Administered 2016-10-28: 1000 mg via INTRAVENOUS
  Filled 2016-10-28: qty 200

## 2016-10-28 MED ORDER — DOXYCYCLINE HYCLATE 100 MG PO CAPS: 100.0000 mg | ORAL_CAPSULE | Freq: Two times a day (BID) | ORAL | 0 refills | Status: DC

## 2016-10-28 NOTE — ED Provider Notes (Signed)
[facility] DEPT Provider Note   CSN: [id number] Arrival date & time: 10/28/16  1543     History   Chief Complaint Chief Complaint  Patient presents with  . Abscess    HPI Jacqueline [name] is a 65 y.o. female.  Patient is a 65 year old female who presents to the emergency department with a complaint of an infection involving her chin.  The patient states that approximately a week ago she noticed a pimple on her chin on. She states that the following day it began to get larger on. On yesterday the entire chin was swollen with increased redness. She states that over the last 3 or 4 days she's been trying to use warm compresses. She states she also had some doxycycline at home from a previous infection and she use that for about 3 days, but the problem continues to get worse. She denies difficulty swallowing or breathing. She states the area is quite uncomfortable however. She denies diabetes, and does not have any conditions that should affect her immune system.    Abscess    Past Medical History:  Diagnosis Date  . Anxiety   . Chronic back pain   . Drug-seeking behavior   . Gastric nodule 2009   EUS, ?leiomyoma  . 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 GERD (gastroesophageal reflux disease)   . Gout   . History of pancreatitis    Biliary and/or etoh?  . Irritable bowel syndrome   . Knee pain   . Migraines   . Migraines   . Peripheral neuropathy ([place])   . Polysubstance abuse    opiates, cocaine, marijuana    Patient Active Problem List   Diagnosis Date Noted  . GERD (gastroesophageal reflux disease) 09/26/2016  . PUD (peptic ulcer disease) 10/01/2015  . Nausea with vomiting 10/01/2015  . Loss of weight 10/01/2015  . Dizzy 07/16/2015  . Major depressive disorder, recurrent, severe without psychotic features ([place])   . Substance induced mood disorder ([place]) 04/24/2015  .  Opioid type dependence, continuous ([place]) 04/24/2015  . Severe major depression without psychotic features ([place]) 04/24/2015  . PTSD (post-traumatic stress disorder) 04/24/2015  . Chest pain, localized 04/28/2012  . Odynophagia 09/30/2011  . Colon cancer screening 09/30/2011  . Gastric tumor 09/30/2011  . CLOSED FRACTURE OF UPPER END OF FIBULA 07/31/2010  . Esophageal dysphagia 07/17/2009  . ABDOMINAL PAIN, GENERALIZED 07/17/2009  . GASTRITIS 07/12/2009  . IBS 07/12/2009  . RUQ PAIN 07/12/2009  . EPIGASTRIC PAIN 07/12/2009  . PANCREATITIS, ACUTE, HX OF 07/12/2009  . Osteoarthrosis, unspecified whether generalized or localized, hand 11/09/2008  . OSTEOARTHRITIS, LOWER LEG 10/25/2008  . DERANGEMENT MENISCUS 10/16/2008  . KNEE PAIN 10/16/2008    Past Surgical History:  Procedure Laterality Date  . ABDOMINAL HYSTERECTOMY    . ABDOMINAL SURGERY    . BACK SURGERY    . back surgery /ray cage fusion comberg, gso    . BIOPSY  07/31/2015   Procedure: BIOPSY (DUODENAL, GASTRIC, GASTRIC ULCER, ESOPHAGEAL);  Surgeon: Danie Binder, MD;  Location: [facility];  Service: Endoscopy;;  . CESAREAN SECTION    . CHOLECYSTECTOMY    . COLONOSCOPY  2001   Dr. Laural Golden, hemorrhoids  . COLONOSCOPY  10/21/2011   [id number] polyp multiple/internal hemorrhoids  . DILATION AND CURETTAGE OF UTERUS    . ESOPHAGEAL DILATION N/A 07/31/2015   Procedure: ESOPHAGEAL DILATION WIRE GUIDED WITH SAVORY DILATORS  15MM, 16MM;  Surgeon: Danie Binder, MD;  Location: [facility];  Service: Endoscopy;  Laterality: N/A;  . ESOPHAGOGASTRODUODENOSCOPY  11/2008   A 9-mm submucosal lesion seen in the cardia., gastritis, no hpylori  . ESOPHAGOGASTRODUODENOSCOPY (EGD) WITH PROPOFOL N/A 07/31/2015   [id number] ulcer and moderate gastritis, dysphagia, empirical dilation with no identified source. esophageal, gastric, duodenal bx unremarkable.   . ESOPHAGOGASTRODUODENOSCOPY (EGD) WITH PROPOFOL N/A 11/13/2015   SLF: 1. Gastric ulcer has healed.  2. single non-bleeding gastric AVMs 3. mild non-erosive gastritis.   . EUS  12/2008   Dr. Paulita Fujita. Gastric nodule most consistent with Leiomyoma. Slightly thickened and irregular gallbladder wall, possibly due to sludge or diminutive stones. Recommend EUS in January 2011 to followup gastric nodule.  Marland Kitchen FOOT SURGERY    . GALLBLADDER SURGERY  2010  . 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  Marland Kitchen SAVORY DILATION  02/03/2012   [id number] in the distal esophagus/Mild gastritis  . toe graft    . TONSILLECTOMY      OB History    Gravida Para Term Preterm AB Living   3 2 2   1 2    SAB TAB Ectopic Multiple Live Births   1               Home Medications    Prior to Admission medications   Medication Sig Start Date End Date Taking? Authorizing Provider  acetaminophen (TYLENOL) 500 MG tablet Take 1,000 mg by mouth every 6 (six) hours as needed for mild pain.    Historical Provider, MD  doxycycline (VIBRAMYCIN) 100 MG capsule Take 1 capsule (100 mg total) by mouth 2 (two) times daily. 08/19/16   Fransico Meadow, PA-C  gabapentin (NEURONTIN) 300 MG capsule Take 2 capsules (600 mg total) by mouth 3 (three) times daily. 04/30/15   Niel Hummer, NP  linaclotide [facility]) 145 MCG CAPS capsule Take 1 capsule (145 mcg total) by mouth daily before breakfast. 09/26/16   Mahala Menghini, PA-C  methocarbamol (ROBAXIN) 750 MG tablet Take 1 tablet (750 mg total) by mouth 4 (four) times daily. 04/30/15   Niel Hummer, NP  omeprazole (PRILOSEC) 20 MG capsule Take 1 capsule (20 mg total) by mouth 2 (two) times daily before a meal. 09/26/16   Mahala Menghini, PA-C  ondansetron (ZOFRAN) 4 MG tablet Take 1 tablet (4 mg total) by mouth every 8 (eight) hours as needed for nausea or vomiting. 05/22/16   Carlis Stable, NP  ondansetron (ZOFRAN-ODT) 4 MG disintegrating tablet  Take 1 tablet (4 mg total) by mouth every 8 (eight) hours as needed for nausea or vomiting. 09/26/16   Mahala Menghini, PA-C  sucralfate (CARAFATE) 1 GM/10ML suspension Take 10 mLs (1 g total) by mouth 4 (four) times daily -  with meals and at bedtime. 10/01/15   Mahala Menghini, PA-C  traMADol (ULTRAM) 50 MG tablet Take 1 tablet (50 mg total) by mouth every 6 (six) hours as needed for moderate pain. 04/30/15   Niel Hummer, NP  traZODone (DESYREL) 50 MG tablet Take 1 tablet (50 mg total) by mouth at bedtime and may repeat dose one time if needed. 04/30/15   Niel Hummer, NP    Family History Family History  Problem Relation Age of Onset  . Colon cancer Other   .  Colon cancer Mother     diagnosed in late 97s and died age 2  . Heart failure Mother   . Heart defect      family history   . Arthritis      family history  . COPD      family history   . Cancer      multiple unknown type  . Heart attack Father     deceased at 67  . Hypertension Father   . Heart failure Father   . Lung cancer Maternal Uncle   . Throat cancer Maternal Uncle   . Colon cancer Paternal Uncle   . Colon cancer Maternal Aunt   . Anesthesia problems Neg Hx   . Hypotension Neg Hx   . Malignant hyperthermia Neg Hx   . Pseudochol deficiency Neg Hx     Social History Social History  Substance Use Topics  . Smoking status: Current Every Day Smoker    Packs/day: 0.25    Years: 35.00    Types: Cigarettes  . Smokeless tobacco: Never Used  . Alcohol use Yes     Comment: occasional holiday drink... pt denied alcohol use on admission to [facility]     Allergies   Aspirin; Imitrex [sumatriptan base]; Ketorolac tromethamine; Nsaids; Promethazine hcl; and Sulfonamide derivatives   Review of Systems Review of Systems  Musculoskeletal: Positive for back pain.  Skin: Positive for wound.       Wound to chin  Psychiatric/Behavioral: The patient is nervous/anxious.   All other systems reviewed and are  negative.    Physical Exam Updated Vital Signs BP 117/88   Pulse 81   Temp 98.5 F (36.9 C) (Oral)   Resp 16   Ht 5\' 2"  (1.575 m)   Wt 53.5 kg   SpO2 100%   BMI 21.58 kg/m   Physical Exam  Constitutional: She is oriented to person, place, and time. She appears well-developed and well-nourished.  Non-toxic appearance.  HENT:  Head: Normocephalic.  Right Ear: Tympanic membrane and external ear normal.  Left Ear: Tympanic membrane and external ear normal.  There is area of increased redness with cellulitis present at the chin. There is a lesion that is scabbed of the left chin. There are palpable submental nodes present. There is swelling of the lower lip. There is a small pustule of the mucosa of the lower lip.  The airway is patent. Tongue is midline. Speech is understandable.  Eyes: EOM and lids are normal. Pupils are equal, round, and reactive to light.  Neck: Normal range of motion. Neck supple. Carotid bruit is not present.  Cardiovascular: Normal rate, regular rhythm, normal heart sounds, intact distal pulses and normal pulses.   Pulmonary/Chest: Breath sounds normal. No respiratory distress.  Abdominal: Soft. Bowel sounds are normal. There is no tenderness. There is no guarding.  Musculoskeletal: Normal range of motion.  Lymphadenopathy:       Head (right side): No submandibular adenopathy present.       Head (left side): No submandibular adenopathy present.    She has no cervical adenopathy.  Neurological: She is alert and oriented to person, place, and time. She has normal strength. No cranial nerve deficit or sensory deficit.  Skin: Skin is warm and dry.  Psychiatric: She has a normal mood and affect. Her speech is normal.  Nursing note and vitals reviewed.    ED Treatments / Results  Labs (all labs ordered are listed, but only abnormal results are displayed) Labs Reviewed -  No data to display  EKG  EKG Interpretation None       Radiology No results  found.  Procedures Procedures (including critical care time)  Medications Ordered in ED Medications - No data to display   Initial Impression / Assessment and Plan / ED Course  I have reviewed the triage vital signs and the nursing notes.  Pertinent labs & imaging results that were available during my care of the patient were reviewed by me and considered in my medical decision making (see chart for details).  Clinical Course     *I have reviewed nursing notes, vital signs, and all appropriate lab and imaging results for this patient.**  Final Clinical Impressions(s) / ED Diagnoses  Vital signs within normal limits. Pulse oximetry is 100% on room air. The complete blood count shows the white blood cell slightly elevated at 10,800. The hemoglobin is slightly low at 11.8 otherwise the CBC is within normal limits. The basic metabolic panel shows potassium to be slightly low at 3.4. Oral potassium is been given. The glucose is slightly elevated at 110, otherwise normal. The anion gap is 5.  Recheck-patient is drinking Pepsi-Cola watching TV in no distress. Lungs are clear. There is no oral swelling. Is no rash appreciated. Patient seems to be tolerating IV vancomycin without problem. Pt is non toxic appearing. Does not meet criteria for sepsis.  Patient will be treated with doxycycline 2 times daily. I've encouraged patient to take the entire dose this time. Patient will also be treated with Norco for pain. The patient is to be rechecked on November 9 or 10th for recheck of the cellulitis involving the chin area. Patient is in agreement with this plan.     Final diagnoses:  Cellulitis of face    New Prescriptions New Prescriptions   No medications on file     Lily Kocher, PA-C 10/28/16 [place], PA-C 10/28/16 [place], MD 10/29/16 1328

## 2016-10-28 NOTE — ED Triage Notes (Signed)
PT states red swollen area to chin x3 days with yellow drainage that started yesterday and swelling to face with redness noticed.

## 2016-10-28 NOTE — Discharge Instructions (Signed)
Your vital signs within normal limits. Your examination suggest cellulitis of your chin/face. Please return to the emergency department November 9 or 10 for recheck of your cellulitis. Please use doxycycline 2 times daily with food.

## 2016-10-30 ENCOUNTER — Emergency Department (HOSPITAL_COMMUNITY)
Admission: EM | Admit: 2016-10-30 | Discharge: 2016-10-30 | Disposition: A | Discharge status: Home / Self Care | Payer: Medicaid Other | Attending: Emergency Medicine | Admitting: Emergency Medicine

## 2016-10-30 ENCOUNTER — Emergency Department (HOSPITAL_COMMUNITY): Payer: Medicaid Other

## 2016-10-30 ENCOUNTER — Encounter (HOSPITAL_COMMUNITY): Payer: Self-pay

## 2016-10-30 DIAGNOSIS — Z79899 Other long term (current) drug therapy: Secondary | ICD-10-CM | POA: Diagnosis not present

## 2016-10-30 DIAGNOSIS — L03211 Cellulitis of face: Secondary | ICD-10-CM | POA: Insufficient documentation

## 2016-10-30 DIAGNOSIS — F17210 Nicotine dependence, cigarettes, uncomplicated: Secondary | ICD-10-CM | POA: Insufficient documentation

## 2016-10-30 DIAGNOSIS — L0201 Cutaneous abscess of face: Secondary | ICD-10-CM | POA: Diagnosis present

## 2016-10-30 LAB — CBC WITH DIFFERENTIAL/PLATELET
BASOS ABS: 0 10*3/uL (ref 0.0–0.1)
BASOS PCT: 0 %
EOS ABS: 0.1 10*3/uL (ref 0.0–0.7)
EOS PCT: 1 %
HCT: 41 % (ref 36.0–46.0)
Hemoglobin: 13.8 g/dL (ref 12.0–15.0)
Lymphocytes Relative: 18 %
Lymphs Abs: 1.7 10*3/uL (ref 0.7–4.0)
MCH: 31.4 pg (ref 26.0–34.0)
MCHC: 33.7 g/dL (ref 30.0–36.0)
MCV: 93.2 fL (ref 78.0–100.0)
MONO ABS: 0.6 10*3/uL (ref 0.1–1.0)
Monocytes Relative: 7 %
Neutro Abs: 6.8 10*3/uL (ref 1.7–7.7)
Neutrophils Relative %: 74 %
PLATELETS: 270 10*3/uL (ref 150–400)
RBC: 4.4 MIL/uL (ref 3.87–5.11)
RDW: 13.2 % (ref 11.5–15.5)
WBC: 9.2 10*3/uL (ref 4.0–10.5)

## 2016-10-30 LAB — BASIC METABOLIC PANEL
ANION GAP: 8 (ref 5–15)
BUN: 8 mg/dL (ref 6–20)
CALCIUM: 9 mg/dL (ref 8.9–10.3)
CO2: 26 mmol/L (ref 22–32)
Chloride: 102 mmol/L (ref 101–111)
Creatinine, Ser: 0.66 mg/dL (ref 0.44–1.00)
GLUCOSE: 89 mg/dL (ref 65–99)
Potassium: 3.7 mmol/L (ref 3.5–5.1)
SODIUM: 136 mmol/L (ref 135–145)

## 2016-10-30 MED ORDER — HYDROMORPHONE HCL 1 MG/ML IJ SOLN
1.0000 mg | Freq: Once | INTRAMUSCULAR | Status: AC
Start: 2016-10-30 — End: 2016-10-30
  Administered 2016-10-30: 1 mg via INTRAVENOUS
  Filled 2016-10-30: qty 1

## 2016-10-30 MED ORDER — ONDANSETRON HCL 4 MG/2ML IJ SOLN
4.0000 mg | Freq: Once | INTRAMUSCULAR | Status: AC
  Administered 2016-10-30: 4 mg via INTRAVENOUS
  Filled 2016-10-30: qty 2

## 2016-10-30 MED ORDER — HYDROCODONE-ACETAMINOPHEN 7.5-325 MG PO TABS: 1.0000 | ORAL_TABLET | ORAL | 0 refills | Status: DC | PRN

## 2016-10-30 MED ORDER — LIDOCAINE HCL (PF) 2 % IJ SOLN
10.0000 mL | Freq: Once | INTRAMUSCULAR | Status: DC
  Filled 2016-10-30: qty 10

## 2016-10-30 MED ORDER — HYDROCODONE-ACETAMINOPHEN 5-325 MG PO TABS
2.0000 | ORAL_TABLET | Freq: Once | ORAL | Status: AC
  Administered 2016-10-30: 2 via ORAL
  Filled 2016-10-30: qty 2

## 2016-10-30 MED ORDER — HYDROMORPHONE HCL 1 MG/ML IJ SOLN
0.5000 mg | Freq: Once | INTRAMUSCULAR | Status: AC
  Administered 2016-10-30: 0.5 mg via INTRAVENOUS
  Filled 2016-10-30: qty 1

## 2016-10-30 MED ORDER — IOPAMIDOL (ISOVUE-300) INJECTION 61%
75.0000 mL | Freq: Once | INTRAVENOUS | Status: AC | PRN
  Administered 2016-10-30: 75 mL via INTRAVENOUS

## 2016-10-30 MED ORDER — VANCOMYCIN HCL IN DEXTROSE 1-5 GM/200ML-% IV SOLN
1000.0000 mg | Freq: Once | INTRAVENOUS | Status: AC
  Administered 2016-10-30: 1000 mg via INTRAVENOUS
  Filled 2016-10-30: qty 200

## 2016-10-30 MED ORDER — LORAZEPAM 2 MG/ML IJ SOLN
1.0000 mg | Freq: Once | INTRAMUSCULAR | Status: AC
  Administered 2016-10-30: 1 mg via INTRAVENOUS
  Filled 2016-10-30: qty 1

## 2016-10-30 NOTE — ED Triage Notes (Signed)
Pt states she is here for recheck of abscess to lower lip. States the area is still painful, red and swollen .

## 2016-10-30 NOTE — Discharge Instructions (Signed)
Your wound of the chin was drained today. You have cellulitis (infection in the tissue) according to your CT. Please finish you doxycycline as ordered. Use tylenol for mild pain. Use norco for more severe pain. Change dress and remove drain on Saturday.Then change dressing daily until healed.

## 2016-10-30 NOTE — ED Notes (Signed)
Pt taken to ct 

## 2016-10-31 NOTE — ED Provider Notes (Signed)
[facility] DEPT Provider Note   CSN: [id number] Arrival date & time: 10/30/16  0909     History   Chief Complaint Chief Complaint  Patient presents with  . Wound Check    HPI Jacqueline [name] is a 65 y.o. female.  Patient is a 65 year old female who presents to the emergency department for recheck of abscess of her chin and lower lip.  The patient was seen in the emergency department approximately 3 days ago which time she was noted to have cellulitis involving the chin and lower lip. She had blood work done and was placed on doxycycline after receiving vancomycin in the emergency department. The patient returns today stating that she feels that the condition is worse. She feels that her lip is more swollen. Her chin is more tender. She denies fever or chills. She denies nausea vomiting. She states that she has pain sometimes when she attempts to eat. His been no drainage from the lesion of the chin. Patient states she has severe pain with any palpation or movement of her lip or chin.      Past Medical History:  Diagnosis Date  . Anxiety   . Chronic back pain   . Drug-seeking behavior   . Gastric nodule 2009   EUS, ?leiomyoma  . 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 GERD (gastroesophageal reflux disease)   . Gout   . History of pancreatitis    Biliary and/or etoh?  . Irritable bowel syndrome   . Knee pain   . Migraines   . Migraines   . Peripheral neuropathy ([employer])   . Polysubstance abuse    opiates, cocaine, marijuana    Patient Active Problem List   Diagnosis Date Noted  . GERD (gastroesophageal reflux disease) 09/26/2016  . PUD (peptic ulcer disease) 10/01/2015  . Nausea with vomiting 10/01/2015  . Loss of weight 10/01/2015  . Dizzy 07/16/2015  . Major depressive disorder, recurrent, severe without psychotic features ([place])   . Substance induced mood disorder ([place])  04/24/2015  . Opioid type dependence, continuous ([place]) 04/24/2015  . Severe major depression without psychotic features ([place]) 04/24/2015  . PTSD (post-traumatic stress disorder) 04/24/2015  . Chest pain, localized 04/28/2012  . Odynophagia 09/30/2011  . Colon cancer screening 09/30/2011  . Gastric tumor 09/30/2011  . CLOSED FRACTURE OF UPPER END OF FIBULA 07/31/2010  . Esophageal dysphagia 07/17/2009  . ABDOMINAL PAIN, GENERALIZED 07/17/2009  . GASTRITIS 07/12/2009  . IBS 07/12/2009  . RUQ PAIN 07/12/2009  . EPIGASTRIC PAIN 07/12/2009  . PANCREATITIS, ACUTE, HX OF 07/12/2009  . Osteoarthrosis, unspecified whether generalized or localized, hand 11/09/2008  . OSTEOARTHRITIS, LOWER LEG 10/25/2008  . DERANGEMENT MENISCUS 10/16/2008  . KNEE PAIN 10/16/2008    Past Surgical History:  Procedure Laterality Date  . ABDOMINAL HYSTERECTOMY    . ABDOMINAL SURGERY    . BACK SURGERY    . back surgery /ray cage fusion comberg, gso    . BIOPSY  07/31/2015   Procedure: BIOPSY (DUODENAL, GASTRIC, GASTRIC ULCER, ESOPHAGEAL);  Surgeon: Danie Binder, MD;  Location: [facility];  Service: Endoscopy;;  . CESAREAN SECTION    . CHOLECYSTECTOMY    . COLONOSCOPY  2001   Dr. Laural Golden, hemorrhoids  . COLONOSCOPY  10/21/2011   [id number] polyp multiple/internal hemorrhoids  . DILATION AND CURETTAGE OF UTERUS    . ESOPHAGEAL DILATION N/A 07/31/2015   Procedure: ESOPHAGEAL DILATION WIRE  GUIDED WITH SAVORY DILATORS 15MM, 16MM;  Surgeon: Danie Binder, MD;  Location: [facility];  Service: Endoscopy;  Laterality: N/A;  . ESOPHAGOGASTRODUODENOSCOPY  11/2008   A 9-mm submucosal lesion seen in the cardia., gastritis, no hpylori  . ESOPHAGOGASTRODUODENOSCOPY (EGD) WITH PROPOFOL N/A 07/31/2015   [id number] ulcer and moderate gastritis, dysphagia, empirical dilation with no identified source. esophageal, gastric, duodenal bx unremarkable.   . ESOPHAGOGASTRODUODENOSCOPY (EGD) WITH PROPOFOL N/A 11/13/2015   SLF: 1. Gastric  ulcer has healed. 2. single non-bleeding gastric AVMs 3. mild non-erosive gastritis.   . EUS  12/2008   Dr. Paulita Fujita. Gastric nodule most consistent with Leiomyoma. Slightly thickened and irregular gallbladder wall, possibly due to sludge or diminutive stones. Recommend EUS in January 2011 to followup gastric nodule.  Marland Kitchen FOOT SURGERY    . GALLBLADDER SURGERY  2010  . 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  Marland Kitchen SAVORY DILATION  02/03/2012   [id number] in the distal esophagus/Mild gastritis  . toe graft    . TONSILLECTOMY      OB History    Gravida Para Term Preterm AB Living   3 2 2   1 2    SAB TAB Ectopic Multiple Live Births   1               Home Medications    Prior to Admission medications   Medication Sig Start Date End Date Taking? Authorizing Provider  albuterol (PROVENTIL HFA;VENTOLIN HFA) 108 (90 Base) MCG/ACT inhaler Inhale 2 puffs into the lungs every 6 (six) hours as needed for wheezing or shortness of breath.   Yes Historical Provider, MD  doxycycline (VIBRAMYCIN) 100 MG capsule Take 1 capsule (100 mg total) by mouth 2 (two) times daily. 10/28/16  Yes Lily Kocher, PA-C  gabapentin (NEURONTIN) 300 MG capsule Take 2 capsules (600 mg total) by mouth 3 (three) times daily. 04/30/15  Yes Niel Hummer, NP  omeprazole (PRILOSEC) 20 MG capsule Take 1 capsule (20 mg total) by mouth 2 (two) times daily before a meal. 09/26/16  Yes Mahala Menghini, PA-C  ondansetron (ZOFRAN) 4 MG tablet Take 1 tablet (4 mg total) by mouth every 8 (eight) hours as needed for nausea or vomiting. 05/22/16  Yes Carlis Stable, NP  traMADol (ULTRAM) 50 MG tablet Take 1 tablet (50 mg total) by mouth every 6 (six) hours as needed for moderate pain. 04/30/15  Yes Niel Hummer, NP  traZODone (DESYREL) 50 MG tablet Take 1 tablet (50 mg total) by mouth at bedtime  and may repeat dose one time if needed. 04/30/15  Yes Niel Hummer, NP  HYDROcodone-acetaminophen (NORCO) 7.5-325 MG tablet Take 1 tablet by mouth every 4 (four) hours as needed. 10/30/16   Lily Kocher, PA-C  linaclotide [facility]) 145 MCG CAPS capsule Take 1 capsule (145 mcg total) by mouth daily before breakfast. 09/26/16   Mahala Menghini, PA-C  methocarbamol (ROBAXIN) 750 MG tablet Take 1 tablet (750 mg total) by mouth 4 (four) times daily. 04/30/15   Niel Hummer, NP  ondansetron (ZOFRAN-ODT) 4 MG disintegrating tablet Take 1 tablet (4 mg total) by mouth every 8 (eight) hours as needed for nausea or vomiting. 09/26/16   Mahala Menghini, PA-C    Family History Family History  Problem Relation Age of Onset  . Colon cancer  Other   . Colon cancer Mother     diagnosed in late 69s and died age 66  . Heart failure Mother   . Heart defect      family history   . Arthritis      family history  . COPD      family history   . Cancer      multiple unknown type  . Heart attack Father     deceased at 45  . Hypertension Father   . Heart failure Father   . Lung cancer Maternal Uncle   . Throat cancer Maternal Uncle   . Colon cancer Paternal Uncle   . Colon cancer Maternal Aunt   . Anesthesia problems Neg Hx   . Hypotension Neg Hx   . Malignant hyperthermia Neg Hx   . Pseudochol deficiency Neg Hx     Social History Social History  Substance Use Topics  . Smoking status: Current Every Day Smoker    Packs/day: 0.25    Years: 35.00    Types: Cigarettes  . Smokeless tobacco: Never Used  . Alcohol use Yes     Comment: occasional holiday drink... pt denied alcohol use on admission to [facility]     Allergies   Aspirin; Imitrex [sumatriptan base]; Ketorolac tromethamine; Nsaids; Promethazine hcl; and Sulfonamide derivatives   Review of Systems Review of Systems  Musculoskeletal: Positive for arthralgias.  Skin:       abscess  Neurological: Positive for headaches.  Psychiatric/Behavioral:  The patient is nervous/anxious.   All other systems reviewed and are negative.    Physical Exam Updated Vital Signs BP 131/98 (BP Location: Left Arm)   Pulse 76   Temp 98.5 F (36.9 C) (Oral)   Resp 18   SpO2 98%   Physical Exam  Constitutional: She is oriented to person, place, and time. She appears well-developed and well-nourished.  Non-toxic appearance.  HENT:  Head: Normocephalic.  Right Ear: Tympanic membrane and external ear normal.  Left Ear: Tympanic membrane and external ear normal.  The chin and lower lip are swollen. There is a small pustule on the mucosal area of the lower lip. The area is tender to touch. There is now what appears to be an abscess at the center of the chin.  Eyes: EOM and lids are normal. Pupils are equal, round, and reactive to light.  Neck: Normal range of motion. Neck supple. Carotid bruit is not present.  Cardiovascular: Normal rate, regular rhythm, normal heart sounds, intact distal pulses and normal pulses.   Pulmonary/Chest: Breath sounds normal. No respiratory distress.  Abdominal: Soft. Bowel sounds are normal. There is no tenderness. There is no guarding.  Musculoskeletal: Normal range of motion.  Lymphadenopathy:       Head (right side): No submandibular adenopathy present.       Head (left side): No submandibular adenopathy present.    She has no cervical adenopathy.  Neurological: She is alert and oriented to person, place, and time. She has normal strength. No cranial nerve deficit or sensory deficit.  Skin: Skin is warm and dry.  Psychiatric: She has a normal mood and affect. Her speech is normal.  Nursing note and vitals reviewed.    ED Treatments / Results  Labs (all labs ordered are listed, but only abnormal results are displayed) Labs Reviewed  AEROBIC CULTURE (SUPERFICIAL SPECIMEN)  CBC WITH DIFFERENTIAL/PLATELET  BASIC METABOLIC PANEL    EKG  EKG Interpretation None       Radiology Ct  Maxillofacial W  Contrast  Result Date: 10/30/2016 CLINICAL DATA:  Cellulitis chin and lower lip. EXAM: CT MAXILLOFACIAL WITH CONTRAST TECHNIQUE: Multidetector CT imaging of the maxillofacial structures was performed with intravenous contrast. Multiplanar CT image reconstructions were also generated. A small metallic BB was placed on the right temple in order to reliably differentiate right from left. CONTRAST:  45mL ISOVUE-300 IOPAMIDOL (ISOVUE-300) INJECTION 61% COMPARISON:  CT face 06/21/2012 FINDINGS: Osseous: No acute bony abnormality. Negative for fracture or osteomyelitis. The patient is edentulous. Moderate to extensive cervical disc degeneration and spondylosis in the cervical spine. Orbits: The globe is symmetric bilaterally. Optic nerve and extraocular muscles normal. Just above the right orbit, there is a dermal soft tissue mass measuring 12 mm in diameter. This has enlarged since the prior study but was present previously. This could represent a dermal cyst or possibly a lipoma with some stranding internally. Correlate with physical exam of this area. Sinuses: Mild mucosal edema in the maxillary sinuses bilaterally. No air-fluid level. Soft tissues: Rim enhancing fluid collection in the soft tissues anterior to the mandibular symphysis to the left of midline compatible with abscess. The abscess measures 12 mm in diameter with surrounding soft tissue enhancement and edema compatible cellulitis. No bony changes in the mandible. Limited intracranial: Negative IMPRESSION: 12 mm abscess in the chin with surrounding cellulitis. No bony changes in the mandible. Enlarging soft tissue mass superior to the right medial orbit compared with the CT of 06/21/2012. This is probably a benign skin lesion such as a lipoma or dermal cyst. Cervical spondylosis. Electronically Signed   By: Franchot Gallo M.D.   On: 10/30/2016 11:15    Procedures .Marland KitchenIncision and Drainage Date/Time: 10/30/2016 12:16 PM Performed by: Lily Kocher Authorized by: Lily Kocher   Consent:    Consent obtained:  Verbal   Consent given by:  Patient   Risks discussed:  Bleeding, infection and pain Location:    Type:  Abscess   Location:  Head   Head location:  Face (Chin and lower lip) Pre-procedure details:    Skin preparation:  Betadine Anesthesia (see MAR for exact dosages):    Anesthesia method:  Nerve block   Block anesthetic:  Lidocaine 2% w/o epi   Block injection procedure:  Anatomic landmarks identified, introduced needle, incremental injection, anatomic landmarks palpated and negative aspiration for blood   Block outcome:  Anesthesia achieved Procedure type:    Complexity:  Simple Procedure details:    Incision types:  Single straight   Incision depth:  Subcutaneous   Scalpel blade:  11   Wound management:  Probed and deloculated   Drainage:  Purulent   Drainage amount:  Moderate   Wound treatment:  Wound left open   Packing materials:  1/4 in gauze Post-procedure details:    Patient tolerance of procedure:  Tolerated well, no immediate complications   (including critical care time)  Medications Ordered in ED Medications  HYDROmorphone (DILAUDID) injection 0.5 mg (0.5 mg Intravenous Given 10/30/16 1001)  ondansetron (ZOFRAN) injection 4 mg (4 mg Intravenous Given 10/30/16 1001)  iopamidol (ISOVUE-300) 61 % injection 75 mL (75 mLs Intravenous Contrast Given 10/30/16 1049)  LORazepam (ATIVAN) injection 1 mg (1 mg Intravenous Given 10/30/16 1224)  HYDROmorphone (DILAUDID) injection 1 mg (1 mg Intravenous Given 10/30/16 1224)  ondansetron (ZOFRAN) injection 4 mg (4 mg Intravenous Given 10/30/16 1224)  vancomycin (VANCOCIN) IVPB 1000 mg/200 mL premix (0 mg Intravenous Stopped 10/30/16 1436)  HYDROcodone-acetaminophen (NORCO/VICODIN) 5-325 MG per tablet 2 tablet (  2 tablets Oral Given 10/30/16 1400)     Initial Impression / Assessment and Plan / ED Course  I have reviewed the triage vital signs and the nursing  notes.  Pertinent labs & imaging results that were available during my care of the patient were reviewed by me and considered in my medical decision making (see chart for details).  Clinical Course   Pt seen with me by Dr Roderic Palau.  **I have reviewed nursing notes, vital signs, and all appropriate lab and imaging results for this patient.*  Final Clinical Impressions(s) / ED Diagnoses  CT scan reveals an abscess present. Incision and drainage was carried out. Culture was sent to the lab. Patient was given another dose of vancomycin in the emergency department. She will continue the doxycycline as previously ordered. Hydrocodone 7.5 mg given for pain.    Final diagnoses:  Abscess of chin  Cellulitis of face    New Prescriptions Discharge Medication List as of 10/30/2016  1:44 PM    START taking these medications   Details  HYDROcodone-acetaminophen (NORCO) 7.5-325 MG tablet Take 1 tablet by mouth every 4 (four) hours as needed., Starting Thu 10/30/2016, Print         Lily Kocher, PA-C 10/31/16 2218    Milton Ferguson, MD 11/01/16 [phone]

## 2016-11-02 LAB — AEROBIC CULTURE W GRAM STAIN (SUPERFICIAL SPECIMEN)

## 2016-11-02 LAB — AEROBIC CULTURE  (SUPERFICIAL SPECIMEN)

## 2016-11-03 ENCOUNTER — Telehealth (HOSPITAL_BASED_OUTPATIENT_CLINIC_OR_DEPARTMENT_OTHER): Payer: Self-pay | Admitting: Emergency Medicine

## 2016-11-03 NOTE — Telephone Encounter (Signed)
Post ED Visit - Positive Culture Follow-up: Successful Patient Follow-Up  Culture assessed and recommendations reviewed by: []  Elenor Quinones, Pharm.D. []  Heide Guile, Pharm.D., BCPS []  Parks Neptune, Pharm.D. []  Alycia Rossetti, Pharm.D., BCPS []  [place], [place].D., BCPS, AAHIVP []  Legrand Como, Pharm.D., BCPS, AAHIVP []  Milus Glazier, Pharm.D. []  Stephens November, Pharm.DGery Pray Pharm D  Positive wound culture  []  Patient discharged without antimicrobial prescription and treatment is now indicated [x]  Organism is resistant to prescribed ED discharge antimicrobial []  Patient with positive blood cultures  Changes discussed with ED provider: Lorre Munroe PA New antibiotic prescription Stop Bactrim, Start Zyvox 600mg  po q 12 hours x 7 days Called to [place] [phone]  Contacted patient, 11/03/16 [name], Jacqueline [name] 11/03/2016, 12:26 PM

## 2016-11-03 NOTE — Progress Notes (Signed)
ED Antimicrobial Stewardship Positive Culture Follow Up   Jacqueline [name] is an 65 y.o. female who presented to [facility] on 10/30/2016 with a chief complaint of  Chief Complaint  Patient presents with  . Wound Check    Recent Results (from the past 720 hour(s))  Wound or Superficial Culture     Status: None   Collection Time: 10/30/16  1:46 PM  Result Value Ref Range Status   Specimen Description WOUND  Final   Special Requests NONE  Final   Gram Stain   Final    ABUNDANT WBC PRESENT,BOTH PMN AND MONONUCLEAR FEW GRAM POSITIVE COCCI Performed at [facility]    Culture   Final    MODERATE METHICILLIN RESISTANT STAPHYLOCOCCUS AUREUS   Report Status 11/02/2016 FINAL  Final   Organism ID, Bacteria METHICILLIN RESISTANT STAPHYLOCOCCUS AUREUS  Final      Susceptibility   Methicillin resistant staphylococcus aureus - MIC*    CIPROFLOXACIN >=8 RESISTANT Resistant     ERYTHROMYCIN >=8 RESISTANT Resistant     GENTAMICIN <=0.5 SENSITIVE Sensitive     OXACILLIN >=4 RESISTANT Resistant     TETRACYCLINE >=16 RESISTANT Resistant     VANCOMYCIN 1 SENSITIVE Sensitive     TRIMETH/SULFA <=10 SENSITIVE Sensitive     CLINDAMYCIN >=8 RESISTANT Resistant     RIFAMPIN <=0.5 SENSITIVE Sensitive     Inducible Clindamycin NEGATIVE Sensitive     * MODERATE METHICILLIN RESISTANT STAPHYLOCOCCUS AUREUS    [x]  Treated with doxycycline, organism resistant to prescribed antimicrobial. Called patient to see how she was doing before recommending continued outpatient management at the request of Lorre Munroe, [place]. Per pt, doing subjectively better. Pt states swelling has gone down and she took the drain out Saturday.   New antibiotic prescription: Linezolid 600 mg po q12h x 7 days ED Provider: Montine Circle, PA  Carlean Jews, Pharm.D. PGY1 Pharmacy Resident 11/13/20179:10 AM Pager [phone] Phone# [phone]

## 2016-11-04 ENCOUNTER — Ambulatory Visit: Payer: Self-pay | Admitting: Gastroenterology

## 2016-11-20 ENCOUNTER — Ambulatory Visit: Payer: Self-pay | Admitting: Gastroenterology

## 2016-12-02 ENCOUNTER — Telehealth: Payer: Self-pay | Admitting: Gastroenterology

## 2016-12-02 ENCOUNTER — Ambulatory Visit: Payer: Medicaid Other | Admitting: Gastroenterology

## 2016-12-02 ENCOUNTER — Encounter: Payer: Self-pay | Admitting: Gastroenterology

## 2016-12-02 NOTE — Telephone Encounter (Signed)
Pt was a no show and letter sent  °

## 2017-01-19 ENCOUNTER — Ambulatory Visit (INDEPENDENT_AMBULATORY_CARE_PROVIDER_SITE_OTHER): Payer: Self-pay | Admitting: Otolaryngology

## 2017-01-29 ENCOUNTER — Encounter: Payer: Self-pay | Admitting: Vascular Surgery

## 2017-02-05 ENCOUNTER — Encounter (HOSPITAL_COMMUNITY): Payer: Medicaid Other

## 2017-02-05 ENCOUNTER — Encounter: Payer: Medicaid Other | Admitting: Vascular Surgery

## 2017-08-04 ENCOUNTER — Encounter: Payer: Self-pay | Admitting: Vascular Surgery

## 2017-08-13 ENCOUNTER — Ambulatory Visit (INDEPENDENT_AMBULATORY_CARE_PROVIDER_SITE_OTHER): Payer: Medicaid Other | Admitting: Vascular Surgery

## 2017-08-13 ENCOUNTER — Encounter: Payer: Self-pay | Admitting: Vascular Surgery

## 2017-08-13 ENCOUNTER — Encounter (HOSPITAL_COMMUNITY): Payer: Self-pay

## 2017-08-13 VITALS — BP 142/79 | HR 70 | Temp 98.8°F | Resp 18 | Ht 62.0 in | Wt 117.0 lb

## 2017-08-13 DIAGNOSIS — I83813 Varicose veins of bilateral lower extremities with pain: Secondary | ICD-10-CM

## 2017-08-13 DIAGNOSIS — I872 Venous insufficiency (chronic) (peripheral): Secondary | ICD-10-CM

## 2017-08-13 NOTE — Progress Notes (Signed)
Reason for referral: Phlebitis and venous reflux  History of Present Illness  Jacqueline [name] is a 66 y.o. female who presents with chief complaint: swollen legs and painful veins.  Patient notes, onset of swelling when she became pregnant at age 45.  The have gotten progressively worse with burning pain and hypersensitivity to touch,    The patient has had no history of DVT, positive history of varicose vein, no history of venous stasis ulcers, no history of  Lymphedema and no history of skin changes in lower legs.  There is a family history of venous disorders.  The patient has  Used OTC compression stockings in the past.  Other past medical history includes: IBS, Pancreatitis, PTSD, and COPD. She denise HTN, DM or CAD.  Past Medical History:  Diagnosis Date  . Anxiety   . Chronic back pain   . Drug-seeking behavior   . Gastric nodule 2009   EUS, ?leiomyoma  . 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 GERD (gastroesophageal reflux disease)   . Gout   . History of pancreatitis    Biliary and/or etoh?  . Irritable bowel syndrome   . Knee pain   . Migraines   . Migraines   . Peripheral neuropathy   . Polysubstance abuse    opiates, cocaine, marijuana    Past Surgical History:  Procedure Laterality Date  . ABDOMINAL HYSTERECTOMY    . ABDOMINAL SURGERY    . BACK SURGERY    . back surgery /ray cage fusion comberg, gso    . BIOPSY  07/31/2015   Procedure: BIOPSY (DUODENAL, GASTRIC, GASTRIC ULCER, ESOPHAGEAL);  Surgeon: Danie Binder, MD;  Location: [facility];  Service: Endoscopy;;  . CESAREAN SECTION    . CHOLECYSTECTOMY    . COLONOSCOPY  2001   Dr. Laural Golden, hemorrhoids  . COLONOSCOPY  10/21/2011   OYD:XAJOINO polyp multiple/internal hemorrhoids  . DILATION AND CURETTAGE OF UTERUS    . ESOPHAGEAL DILATION N/A 07/31/2015   Procedure: ESOPHAGEAL DILATION WIRE GUIDED WITH SAVORY DILATORS  15MM, 16MM;  Surgeon: Danie Binder, MD;  Location: [facility];  Service: Endoscopy;  Laterality: N/A;  . ESOPHAGOGASTRODUODENOSCOPY  11/2008   A 9-mm submucosal lesion seen in the cardia., gastritis, no hpylori  . ESOPHAGOGASTRODUODENOSCOPY (EGD) WITH PROPOFOL N/A 07/31/2015   MVE:HMCNOBS ulcer and moderate gastritis, dysphagia, empirical dilation with no identified source. esophageal, gastric, duodenal bx unremarkable.   . ESOPHAGOGASTRODUODENOSCOPY (EGD) WITH PROPOFOL N/A 11/13/2015   SLF: 1. Gastric ulcer has healed. 2. single non-bleeding gastric AVMs 3. mild non-erosive gastritis.   . EUS  12/2008   Dr. Paulita Fujita. Gastric nodule most consistent with Leiomyoma. Slightly thickened and irregular gallbladder wall, possibly due to sludge or diminutive stones. Recommend EUS in January 2011 to followup gastric nodule.  Marland Kitchen FOOT SURGERY    . GALLBLADDER SURGERY  2010  . 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  Marland Kitchen SAVORY DILATION  02/03/2012   JGG:EZMOQHUTM in the distal esophagus/Mild gastritis  . toe graft    . TONSILLECTOMY      Social History   Social History  . Marital status: Divorced    Spouse name: N/A  . Number of children: 2  . Years of  education: 10th grade   Occupational History  . disabled: back problems     Social History Main Topics  . Smoking status: Current Every Day Smoker    Packs/day: 0.25    Years: 35.00    Types: Cigarettes  . Smokeless tobacco: Never Used  . Alcohol use Yes     Comment: occasional holiday drink... pt denied alcohol use on admission to [facility]  . Drug use: Yes    Types: Marijuana     Comment: +cocaine on 04/2015 UDS  . Sexual activity: Not on file   Other Topics Concern  . Not on file   Social History Narrative  . No narrative on file    Family History  Problem Relation Age of Onset  .  Colon cancer Other   . Colon cancer Mother        diagnosed in late 83s and died age 56  . Heart failure Mother   . Heart defect Unknown        family history   . Arthritis Unknown        family history  . COPD Unknown        family history   . Cancer Unknown        multiple unknown type  . Heart attack Father        deceased at 36  . Hypertension Father   . Heart failure Father   . Lung cancer Maternal Uncle   . Throat cancer Maternal Uncle   . Colon cancer Paternal Uncle   . Colon cancer Maternal Aunt   . Anesthesia problems Neg Hx   . Hypotension Neg Hx   . Malignant hyperthermia Neg Hx   . Pseudochol deficiency Neg Hx     Current Outpatient Prescriptions on File Prior to Visit  Medication Sig Dispense Refill  . albuterol (PROVENTIL HFA;VENTOLIN HFA) 108 (90 Base) MCG/ACT inhaler Inhale 2 puffs into the lungs every 6 (six) hours as needed for wheezing or shortness of breath.    . doxycycline (VIBRAMYCIN) 100 MG capsule Take 1 capsule (100 mg total) by mouth 2 (two) times daily. 14 capsule 0  . gabapentin (NEURONTIN) 300 MG capsule Take 2 capsules (600 mg total) by mouth 3 (three) times daily. 180 capsule 0  . HYDROcodone-acetaminophen (NORCO) 7.5-325 MG tablet Take 1 tablet by mouth every 4 (four) hours as needed. 15 tablet 0  . linaclotide (LINZESS) 145 MCG CAPS capsule Take 1 capsule (145 mcg total) by mouth daily before breakfast. 30 capsule 5  . methocarbamol (ROBAXIN) 750 MG tablet Take 1 tablet (750 mg total) by mouth 4 (four) times daily.    Marland Kitchen omeprazole (PRILOSEC) 20 MG capsule Take 1 capsule (20 mg total) by mouth 2 (two) times daily before a meal. 60 capsule 3  . ondansetron (ZOFRAN) 4 MG tablet Take 1 tablet (4 mg total) by mouth every 8 (eight) hours as needed for nausea or vomiting. 30 tablet 1  . ondansetron (ZOFRAN-ODT) 4 MG disintegrating tablet Take 1 tablet (4 mg total) by mouth every 8 (eight) hours as needed for nausea or vomiting. 30 tablet 0  . traMADol  (ULTRAM) 50 MG tablet Take 1 tablet (50 mg total) by mouth every 6 (six) hours as needed for moderate pain. 30 tablet 0  . traZODone (DESYREL) 50 MG tablet Take 1 tablet (50 mg total) by mouth at bedtime and may repeat dose one time if needed. 60 tablet 0   No current facility-administered medications on file prior  to visit.     Allergies as of 08/13/2017 - Review Complete 08/13/2017  Allergen Reaction Noted  . Aspirin Other (See Comments)   . Imitrex [sumatriptan base] Other (See Comments) 09/30/2011  . Ketorolac tromethamine Other (See Comments) 09/30/2011  . Nsaids Other (See Comments) 12/18/2011  . Promethazine hcl Other (See Comments) 12/18/2011  . Sulfonamide derivatives Nausea And Vomiting      ROS:   General:  No weight loss, Fever, chills  HEENT: No recent headaches, no nasal bleeding, no visual changes, no sore throat  Neurologic: No dizziness, blackouts, seizures. No recent symptoms of stroke or mini- stroke. No recent episodes of slurred speech, or temporary blindness.  Cardiac: No recent episodes of chest pain/pressure, no shortness of breath at rest.  positive shortness of breath with exertion.  Denies history of atrial fibrillation or irregular heartbeat  Vascular: No history of rest pain in feet.  No history of claudication.  No history of non-healing ulcer, No history of DVT   Pulmonary: No home oxygen, no productive cough, no hemoptysis,  No asthma or wheezing  Musculoskeletal:  [ ]  Arthritis, [ ]  Low back pain,  [ ]  Joint pain  Hematologic:No history of hypercoagulable state.  No history of easy bleeding.  No history of anemia  Gastrointestinal: No hematochezia or melena,  positive gastroesophageal reflux, positive trouble swallowing  Urinary: [ ]  chronic Kidney disease, [ ]  on HD - [ ]  MWF or [ ]  TTHS, [ ]  Burning with urination, [ ]  Frequent urination, [ ]  Difficulty urinating;   Skin: No rashes  Psychological: No history of anxiety,  No history of  depression  Physical Examination  Vitals:   08/13/17 1400  BP: (!) 142/79  Pulse: 70  Resp: 18  Temp: 98.8 F (37.1 C)  SpO2: 95%  Weight: 117 lb (53.1 kg)  Height: 5\' 2"  (1.575 m)    Body mass index is 21.4 kg/m.  General:  Alert and oriented, no acute distress HEENT: Normal Neck: No bruit or JVD Pulmonary: Clear to auscultation bilaterally Cardiac: Regular Rate and Rhythm without murmur Abdomen: Soft, non-tender, non-distended, no mass, no scars Skin: No rash Extremity Pulses:  2+ radial, brachial, femoral, dorsalis pedis, posterior tibial pulses bilaterally Musculoskeletal: No deformity, positive B LE edema, Left GSV 7-8 mm below the knee with posterior spider veins. Right GSV 4-5 mm below the knee and posterior calf Reticular veins.   Neurologic: Upper and lower extremity motor 5/5 and symmetric  DATA: No studies to date  Assessment: Venous reflux/ varicose veins B LE left > right  Plan: We have recommended thigh high compression hose to be worn daily.  She will follow up in 3 months with our Vein clinic Physicians and a  a venous reflux study of B LE.   Elevation is recommended while at rest.  She has palpable pulses in B LE without history of skin changes.  She is not at risk of limb loss.  Activity as tolerates.    Theda Sers, EMMA MAUREEN PA-C [facility]  The patient was seen in conjunction with Dr. Oneida Alar today  History and exam findings as above. Patient has symptomatic varicose veins bilateral lower extremities. Left leg is worse than the right. She primarily has pain burning and stinging especially when clothing or pressure is against the left aspect of her knee. She would be CEP class II.  She was given a prescription for bilateral lower extremity compression stockings today. She will return in 3  months time with a venous reflux study of both lower extremities. I discussed with the patient today medical management of her  varicose veins with wearing compression stockings and leg elevation. I also discussed with her that the risk of varicose veins is minimal with more nuisance symptoms and that she is not at risk of DVT pulmonary embolus or limb loss secondary to this. She has easily palpable pedal pulses and no component of arterial disease.  Ruta Hinds, MD [facility] [place] Office: [phone] Pager: [phone]

## 2017-08-27 ENCOUNTER — Encounter (HOSPITAL_COMMUNITY): Payer: Self-pay

## 2017-10-06 ENCOUNTER — Encounter: Payer: Self-pay | Admitting: Nurse Practitioner

## 2017-10-06 ENCOUNTER — Other Ambulatory Visit: Payer: Self-pay

## 2017-10-06 ENCOUNTER — Ambulatory Visit (INDEPENDENT_AMBULATORY_CARE_PROVIDER_SITE_OTHER): Payer: Medicaid Other | Admitting: Nurse Practitioner

## 2017-10-06 ENCOUNTER — Telehealth: Payer: Self-pay

## 2017-10-06 VITALS — BP 117/82 | HR 72 | Temp 98.5°F | Ht 62.0 in | Wt 113.8 lb

## 2017-10-06 DIAGNOSIS — K219 Gastro-esophageal reflux disease without esophagitis: Secondary | ICD-10-CM

## 2017-10-06 DIAGNOSIS — R112 Nausea with vomiting, unspecified: Secondary | ICD-10-CM

## 2017-10-06 DIAGNOSIS — K5900 Constipation, unspecified: Secondary | ICD-10-CM | POA: Insufficient documentation

## 2017-10-06 MED ORDER — ONDANSETRON HCL 4 MG PO TABS: 4.0000 mg | ORAL_TABLET | Freq: Three times a day (TID) | ORAL | 1 refills | Status: DC | PRN

## 2017-10-06 NOTE — Progress Notes (Addendum)
REVIEWED-NO ADDITIONAL RECOMMENDATIONS.  Referring Provider: Neale Burly, MD Primary Care Physician:  Neale Burly, MD Primary GI:  Dr. Oneida Alar  Chief Complaint  Patient presents with  . Nausea    x few months  . belching    after anything she eats/drinks    HPI:   Jacqueline [name] is a 66 y.o. female who presents for nausea and belching. The patient was last seen in our office 09/26/2016 for non-intractable nausea and vomiting, irritable bowel syndrome constipation type, epigastric pain, GERD. At that time indicated she is constantly nauseated, occasional vomiting, alternates constipation and diarrhea but predominantly constipated. Chronic weight loss subjectively without known amount, weight stable per Epic. Heartburn reoccurrence since running out of her PPI. She was on Protonix, prefers Prilosec. Noted history of peptic ulcer disease. Denied NSAIDs. History of cocaine use, denies current use. Recommended start omeprazole 20 mg twice a day, Zofran as needed, Linzess 145 daily.  Last EGD completed 11/13/2015 found gastric ulcer healed, single nonbleeding gastric AVM, mild nonerosive gastritis. Recommended low-fat diet, Protonix twice daily, follow-up with GI providers at [facility] for dysphagia/abdominal pain.  Colonoscopy up-to-date 10/21/2011. Recommended 10 year repeat exam (2022) on propofol and with the assistance of the pediatric colonoscope.  She was a no-show to her follow-up visit on 12/02/2016.  Today she states her nausea has returned as well has GERD now worse. Has GERD symptoms daily over the past couple weeks regardless of what she eats. Still on Prilosec twice daily and needs refill. Occasional vomiting about one to two times a week (on a bad week.) Denies hematemesis. Has a bowel movement about every other day, is emptying completely. Overall constipation is improved. Takes Ibuprofen as needed, about twice a week. Last dose 3-4 days ago. Denies ASA powders. Has  epigastric abdominal pain and lower abdominal pain. Epigastric pain radiates around. Denies hematemesis, melena. Has frequent belching as well regardless of what she eats. Denies fever, chills. Notes subjective weight loss of unknown amount. Her weight is within 4-5 lbs over the past year. Has been having dyspnea lately, worse with exertion. Has not seen anyone about that but is making appointment with PCP. Inhaler isn't helping like it usually does. Denies chest pain, dizziness, lightheadedness, syncope, near syncope. Denies any other upper or lower GI symptoms.  Past Medical History:  Diagnosis Date  . Anxiety   . Chronic back pain   . Drug-seeking behavior   . Gastric nodule 2009   EUS, ?leiomyoma  . 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 GERD (gastroesophageal reflux disease)   . Gout   . History of pancreatitis    Biliary and/or etoh?  . Irritable bowel syndrome   . Knee pain   . Migraines   . Migraines   . Peripheral neuropathy   . Polysubstance abuse ([place])    opiates, cocaine, marijuana    Past Surgical History:  Procedure Laterality Date  . ABDOMINAL HYSTERECTOMY    . ABDOMINAL SURGERY    . BACK SURGERY    . back surgery /ray cage fusion comberg, gso    . BIOPSY  07/31/2015   Procedure: BIOPSY (DUODENAL, GASTRIC, GASTRIC ULCER, ESOPHAGEAL);  Surgeon: Danie Binder, MD;  Location: [facility];  Service: Endoscopy;;  . CESAREAN SECTION    . CHOLECYSTECTOMY    . COLONOSCOPY  2001   Dr. Laural Golden, hemorrhoids  . COLONOSCOPY  10/21/2011  DEY:CXKGYJE polyp multiple/internal hemorrhoids  . DILATION AND CURETTAGE OF UTERUS    . ESOPHAGEAL DILATION N/A 07/31/2015   Procedure: ESOPHAGEAL DILATION WIRE GUIDED WITH SAVORY DILATORS 15MM, 16MM;  Surgeon: Danie Binder, MD;  Location: [facility];  Service: Endoscopy;  Laterality: N/A;  . ESOPHAGOGASTRODUODENOSCOPY  11/2008   A 9-mm submucosal lesion seen  in the cardia., gastritis, no hpylori  . ESOPHAGOGASTRODUODENOSCOPY (EGD) WITH PROPOFOL N/A 07/31/2015   HUD:JSHFWYO ulcer and moderate gastritis, dysphagia, empirical dilation with no identified source. esophageal, gastric, duodenal bx unremarkable.   . ESOPHAGOGASTRODUODENOSCOPY (EGD) WITH PROPOFOL N/A 11/13/2015   SLF: 1. Gastric ulcer has healed. 2. single non-bleeding gastric AVMs 3. mild non-erosive gastritis.   . EUS  12/2008   Dr. Paulita Fujita. Gastric nodule most consistent with Leiomyoma. Slightly thickened and irregular gallbladder wall, possibly due to sludge or diminutive stones. Recommend EUS in January 2011 to followup gastric nodule.  Marland Kitchen FOOT SURGERY    . GALLBLADDER SURGERY  2010  . 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  Marland Kitchen SAVORY DILATION  02/03/2012   VZC:HYIFOYDXA in the distal esophagus/Mild gastritis  . toe graft    . TONSILLECTOMY      Current Outpatient Prescriptions  Medication Sig Dispense Refill  . albuterol (PROVENTIL HFA;VENTOLIN HFA) 108 (90 Base) MCG/ACT inhaler Inhale 2 puffs into the lungs every 6 (six) hours as needed for wheezing or shortness of breath.    . cyclobenzaprine (FLEXERIL) 10 MG tablet Take 1 tablet by mouth 3 (three) times daily.  0  . FLUoxetine (PROZAC) 40 MG capsule daily.  0  . gabapentin (NEURONTIN) 300 MG capsule Take 2 capsules (600 mg total) by mouth 3 (three) times daily. 180 capsule 0  . omeprazole (PRILOSEC) 20 MG capsule Take 1 capsule (20 mg total) by mouth 2 (two) times daily before a meal. 60 capsule 3  . SUBOXONE 8-2 MG FILM 3 (three) times daily.  0   No current facility-administered medications for this visit.     Allergies as of 10/06/2017 - Review Complete 10/06/2017  Allergen Reaction Noted  . Aspirin Other (See Comments)   . Imitrex [sumatriptan base] Other (See  Comments) 09/30/2011  . Ketorolac tromethamine Other (See Comments) 09/30/2011  . Nsaids Other (See Comments) 12/18/2011  . Promethazine hcl Other (See Comments) 12/18/2011  . Sulfonamide derivatives Nausea And Vomiting     Family History  Problem Relation Age of Onset  . Colon cancer Other   . Colon cancer Mother        diagnosed in late 18s and died age 69  . Heart failure Mother   . Heart defect Unknown        family history   . Arthritis Unknown        family history  . COPD Unknown        family history   . Cancer Unknown        multiple unknown type  . Heart attack Father        deceased at 28  . Hypertension Father   . Heart failure Father   . Lung cancer Maternal Uncle   . Throat cancer Maternal Uncle   . Colon cancer Paternal Uncle   . Colon cancer Maternal Aunt   . Anesthesia problems Neg Hx   . Hypotension Neg Hx   .  Malignant hyperthermia Neg Hx   . Pseudochol deficiency Neg Hx     Social History   Social History  . Marital status: Divorced    Spouse name: N/A  . Number of children: 2  . Years of education: 10th grade   Occupational History  . disabled: back problems     Social History Main Topics  . Smoking status: Current Every Day Smoker    Packs/day: 0.25    Years: 35.00    Types: Cigarettes  . Smokeless tobacco: Never Used  . Alcohol use Yes     Comment: occasional holiday drink... pt denied alcohol use on admission to [facility]  . Drug use: Yes    Frequency: 1.0 time per week    Types: Marijuana     Comment: +cocaine on 04/2015 UDS  . Sexual activity: Not Asked   Other Topics Concern  . None   Social History Narrative  . None    Review of Systems: Complete ROS negative except as per HPI.  Physical Exam: BP 117/82   Pulse 72   Temp 98.5 F (36.9 C) (Oral)   Ht 5\' 2"  (1.575 m)   Wt 113 lb 12.8 oz (51.6 kg)   BMI 20.81 kg/m  General:   Alert and oriented. Pleasant and cooperative. Well-nourished and well-developed.  Eyes:   Without icterus, sclera clear and conjunctiva pink.  Ears:  Normal auditory acuity. Cardiovascular:  S1, S2 present without murmurs appreciated. Extremities without clubbing or edema. Respiratory:  Clear to auscultation bilaterally. No wheezes, rales, or rhonchi. No distress.  Gastrointestinal:  +BS, soft, and non-distended. Noted moderate epigastric and LUQ abdominal pain with palpation. No HSM noted. No guarding or rebound. No masses appreciated.  Rectal:  Deferred  Musculoskalatal:  Symmetrical without gross deformities. Neurologic:  Alert and oriented x4;  grossly normal neurologically. Psych:  Alert and cooperative. Normal mood and affect. Heme/Lymph/Immune: No excessive bruising noted.    10/06/2017 11:30 AM   Disclaimer: This note was dictated with voice recognition software. Similar sounding words can inadvertently be transcribed and may not be corrected upon review.

## 2017-10-06 NOTE — Telephone Encounter (Signed)
Pt requested that I call her daughter Angie Fava) to inform of her pre-op appt. Tried to call Hayley at [phone], [place]. Letter mailed to pt.

## 2017-10-06 NOTE — Assessment & Plan Note (Signed)
Acute worsening of GERD symptoms within the past few months including persistent nausea and vomiting without hematemesis, no melena or other red flag/warning signs or symptoms. She does have a history of peptic ulcer disease. She is on Prilosec twice a day which is not helping currently. She does admit to taking NSAIDs about twice a week. At this point I will have her stop Prilosec, start Dexon 60 mg daily with samples. I will set her up for an upper endoscopy to further evaluate for etiologies behind her symptoms including possible H. pylori, possible gastritis, esophagitis, peptic ulcer disease, duodenitis, duodenal ulcers. Return for follow-up in 3 months. Further recommendations per endoscopy procedure.  Proceed with EGD on propofol/MAC with Dr. Oneida Alar in near future: the risks, benefits, and alternatives have been discussed with the patient in detail. The patient states understanding and desires to proceed.  The patient is currently on Suboxone and Flexeril. History of cocaine use. Denies current cocaine use. She does currently use marijuana about once a week. Given her history, as well as recommendations after last endoscopic procedure, we will plan for propofol/MAC to promote adequate sedation.

## 2017-10-06 NOTE — Assessment & Plan Note (Signed)
Constipation much improved. Continue when necessary over-the-counter treatments for rare breakthrough constipation. Return for follow-up in 3 months.

## 2017-10-06 NOTE — Patient Instructions (Addendum)
1. We will schedule your endoscopy for you. 2. Stop taking Prilosec. 3. I will be V samples of Dexilant to take once a day on an empty stomach. 4. Samples will last for 2 weeks. 5. Call us in 1-2 weeks and let us know how you're doing. 6. I sent an Zofran 4 mg to your pharmacy for nausea. Take this every 8 hours as needed. 7. It is important that you stop taking NSAIDs (ibuprofen, Motrin, Advil, Aleve, naproxen, Naprosyn, and any medication with "NSAID" on the bottle). 8. Return for follow-up in 3 months. 9. Call us if you have any questions or concerns.

## 2017-10-06 NOTE — Progress Notes (Signed)
CC'D TO PCP °

## 2017-10-06 NOTE — Assessment & Plan Note (Signed)
Persistent not intractable nausea and vomiting associated with her acutely worse GERD symptoms. I will send an Zofran for symptomatic management. Her nausea and vomiting in addition to her history of peptic ulcer disease and acute onset worsening of GERD is worrisome for possible recurrent peptic ulcer disease in the setting of NSAID use. Return for follow-up in 3 months. Further management of GERD as per below.

## 2017-10-09 NOTE — Patient Instructions (Signed)
Jacqueline [name]  10/09/2017     @PREFPERIOPPHARMACY @   Your procedure is scheduled on  10/20/2017   Report to [facility] at  1230  P.M.  Call this number if you have problems the morning of surgery:  [phone]   Remember:  Do not eat food or drink liquids after midnight.  Take these medicines the morning of surgery with A SIP OF WATER  Flexaril, prozac, neurontin, prilosec, zofran, suboxone.   Do not wear jewelry, make-up or nail polish.  Do not wear lotions, powders, or perfumes, or deoderant.  Do not shave 48 hours prior to surgery.  Men may shave face and neck.  Do not bring valuables to the hospital.  [facility] is not responsible for any belongings or valuables.  Contacts, dentures or bridgework may not be worn into surgery.  Leave your suitcase in the car.  After surgery it may be brought to your room.  For patients admitted to the hospital, discharge time will be determined by your treatment team.  Patients discharged the day of surgery will not be allowed to drive home.   Name and phone number of your driver:   family Special instructions:  Follow the diet and prep instructions given to you by Dr Nona Dell office.  Please read over the following fact sheets that you were given. Anesthesia Post-op Instructions and Care and Recovery After Surgery       Esophagogastroduodenoscopy Esophagogastroduodenoscopy (EGD) is a procedure to examine the lining of the esophagus, stomach, and first part of the small intestine (duodenum). This procedure is done to check for problems such as inflammation, bleeding, ulcers, or growths. During this procedure, a long, flexible, lighted tube with a camera attached (endoscope) is inserted down the throat. Tell a health care provider about:  Any allergies you have.  All medicines you are taking, including vitamins, herbs, eye drops, creams, and over-the-counter medicines.  Any problems you or family members have had  with anesthetic medicines.  Any blood disorders you have.  Any surgeries you have had.  Any medical conditions you have.  Whether you are pregnant or may be pregnant. What are the risks? Generally, this is a safe procedure. However, problems may occur, including:  Infection.  Bleeding.  A tear (perforation) in the esophagus, stomach, or duodenum.  Trouble breathing.  Excessive sweating.  Spasms of the larynx.  A slowed heartbeat.  Low blood pressure.  What happens before the procedure?  Follow instructions from your health care provider about eating or drinking restrictions.  Ask your health care provider about: ? Changing or stopping your regular medicines. This is especially important if you are taking diabetes medicines or blood thinners. ? Taking medicines such as aspirin and ibuprofen. These medicines can thin your blood. Do not take these medicines before your procedure if your health care provider instructs you not to.  Plan to have someone take you home after the procedure.  If you wear dentures, be ready to remove them before the procedure. What happens during the procedure?  To reduce your risk of infection, your health care team will wash or sanitize their hands.  An IV tube will be put in a vein in your hand or arm. You will get medicines and fluids through this tube.  You will be given one or more of the following: ? A medicine to help you relax (sedative). ? A medicine to numb the area (local anesthetic). This  medicine may be sprayed into your throat. It will make you feel more comfortable and keep you from gagging or coughing during the procedure. ? A medicine for pain.  A mouth guard may be placed in your mouth to protect your teeth and to keep you from biting on the endoscope.  You will be asked to lie on your left side.  The endoscope will be lowered down your throat into your esophagus, stomach, and duodenum.  Air will be put into the  endoscope. This will help your health care provider see better.  The lining of your esophagus, stomach, and duodenum will be examined.  Your health care provider may: ? Take a tissue sample so it can be looked at in a lab (biopsy). ? Remove growths. ? Remove objects (foreign bodies) that are stuck. ? Treat any bleeding with medicines or other devices that stop tissue from bleeding. ? Widen (dilate) or stretch narrowed areas of your esophagus and stomach.  The endoscope will be taken out. The procedure may vary among health care providers and hospitals. What happens after the procedure?  Your blood pressure, heart rate, breathing rate, and blood oxygen level will be monitored often until the medicines you were given have worn off.  Do not eat or drink anything until the numbing medicine has worn off and your gag reflex has returned. This information is not intended to replace advice given to you by your health care provider. Make sure you discuss any questions you have with your health care provider. Document Released: 04/10/2005 Document Revised: 05/15/2016 Document Reviewed: 11/01/2015 Elsevier Interactive Patient Education  2018 [employer]. Esophagogastroduodenoscopy, Care After Refer to this sheet in the next few weeks. These instructions provide you with information about caring for yourself after your procedure. Your health care provider may also give you more specific instructions. Your treatment has been planned according to current medical practices, but problems sometimes occur. Call your health care provider if you have any problems or questions after your procedure. What can I expect after the procedure? After the procedure, it is common to have:  A sore throat.  Nausea.  Bloating.  Dizziness.  Fatigue.  Follow these instructions at home:  Do not eat or drink anything until the numbing medicine (local anesthetic) has worn off and your gag reflex has returned. You  will know that the local anesthetic has worn off when you can swallow comfortably.  Do not drive for 24 hours if you received a medicine to help you relax (sedative).  If your health care provider took a tissue sample for testing during the procedure, make sure to get your test results. This is your responsibility. Ask your health care provider or the department performing the test when your results will be ready.  Keep all follow-up visits as told by your health care provider. This is important. Contact a health care provider if:  You cannot stop coughing.  You are not urinating.  You are urinating less than usual. Get help right away if:  You have trouble swallowing.  You cannot eat or drink.  You have throat or chest pain that gets worse.  You are dizzy or light-headed.  You faint.  You have nausea or vomiting.  You have chills.  You have a fever.  You have severe abdominal pain.  You have black, tarry, or bloody stools. This information is not intended to replace advice given to you by your health care provider. Make sure you discuss any questions you  have with your health care provider. Document Released: 11/24/2012 Document Revised: 05/15/2016 Document Reviewed: 11/01/2015 Elsevier Interactive Patient Education  2018 [place] Anesthesia is a term that refers to techniques, procedures, and medicines that help a person stay safe and comfortable during a medical procedure. Monitored anesthesia care, or sedation, is one type of anesthesia. Your anesthesia specialist may recommend sedation if you will be having a procedure that does not require you to be unconscious, such as:  Cataract surgery.  A dental procedure.  A biopsy.  A colonoscopy.  During the procedure, you may receive a medicine to help you relax (sedative). There are three levels of sedation:  Mild sedation. At this level, you may feel awake and relaxed. You will be  able to follow directions.  Moderate sedation. At this level, you will be sleepy. You may not remember the procedure.  Deep sedation. At this level, you will be asleep. You will not remember the procedure.  The more medicine you are given, the deeper your level of sedation will be. Depending on how you respond to the procedure, the anesthesia specialist may change your level of sedation or the type of anesthesia to fit your needs. An anesthesia specialist will monitor you closely during the procedure. Let your health care provider know about:  Any allergies you have.  All medicines you are taking, including vitamins, herbs, eye drops, creams, and over-the-counter medicines.  Any use of steroids (by mouth or as a cream).  Any problems you or family members have had with sedatives and anesthetic medicines.  Any blood disorders you have.  Any surgeries you have had.  Any medical conditions you have, such as sleep apnea.  Whether you are pregnant or may be pregnant.  Any use of cigarettes, alcohol, or street drugs. What are the risks? Generally, this is a safe procedure. However, problems may occur, including:  Getting too much medicine (oversedation).  Nausea.  Allergic reaction to medicines.  Trouble breathing. If this happens, a breathing tube may be used to help with breathing. It will be removed when you are awake and breathing on your own.  Heart trouble.  Lung trouble.  Before the procedure Staying hydrated Follow instructions from your health care provider about hydration, which may include:  Up to 2 hours before the procedure - you may continue to drink clear liquids, such as water, clear fruit juice, black coffee, and plain tea.  Eating and drinking restrictions Follow instructions from your health care provider about eating and drinking, which may include:  8 hours before the procedure - stop eating heavy meals or foods such as meat, fried foods, or fatty  foods.  6 hours before the procedure - stop eating light meals or foods, such as toast or cereal.  6 hours before the procedure - stop drinking milk or drinks that contain milk.  2 hours before the procedure - stop drinking clear liquids.  Medicines Ask your health care provider about:  Changing or stopping your regular medicines. This is especially important if you are taking diabetes medicines or blood thinners.  Taking medicines such as aspirin and ibuprofen. These medicines can thin your blood. Do not take these medicines before your procedure if your health care provider instructs you not to.  Tests and exams  You will have a physical exam.  You may have blood tests done to show: ? How well your kidneys and liver are working. ? How well your blood can clot.  General instructions  Plan to have someone take you home from the hospital or clinic.  If you will be going home right after the procedure, plan to have someone with you for 24 hours.  What happens during the procedure?  Your blood pressure, heart rate, breathing, level of pain and overall condition will be monitored.  An IV tube will be inserted into one of your veins.  Your anesthesia specialist will give you medicines as needed to keep you comfortable during the procedure. This may mean changing the level of sedation.  The procedure will be performed. After the procedure  Your blood pressure, heart rate, breathing rate, and blood oxygen level will be monitored until the medicines you were given have worn off.  Do not drive for 24 hours if you received a sedative.  You may: ? Feel sleepy, clumsy, or nauseous. ? Feel forgetful about what happened after the procedure. ? Have a sore throat if you had a breathing tube during the procedure. ? Vomit. This information is not intended to replace advice given to you by your health care provider. Make sure you discuss any questions you have with your health care  provider. Document Released: 09/03/2005 Document Revised: 05/16/2016 Document Reviewed: 03/30/2016 Elsevier Interactive Patient Education  2018 [place], Care After These instructions provide you with information about caring for yourself after your procedure. Your health care provider may also give you more specific instructions. Your treatment has been planned according to current medical practices, but problems sometimes occur. Call your health care provider if you have any problems or questions after your procedure. What can I expect after the procedure? After your procedure, it is common to:  Feel sleepy for several hours.  Feel clumsy and have poor balance for several hours.  Feel forgetful about what happened after the procedure.  Have poor judgment for several hours.  Feel nauseous or vomit.  Have a sore throat if you had a breathing tube during the procedure.  Follow these instructions at home: For at least 24 hours after the procedure:   Do not: ? Participate in activities in which you could fall or become injured. ? Drive. ? Use heavy machinery. ? Drink alcohol. ? Take sleeping pills or medicines that cause drowsiness. ? Make important decisions or sign legal documents. ? Take care of children on your own.  Rest. Eating and drinking  Follow the diet that is recommended by your health care provider.  If you vomit, drink water, juice, or soup when you can drink without vomiting.  Make sure you have little or no nausea before eating solid foods. General instructions  Have a responsible adult stay with you until you are awake and alert.  Take over-the-counter and prescription medicines only as told by your health care provider.  If you smoke, do not smoke without supervision.  Keep all follow-up visits as told by your health care provider. This is important. Contact a health care provider if:  You keep feeling nauseous or you  keep vomiting.  You feel light-headed.  You develop a rash.  You have a fever. Get help right away if:  You have trouble breathing. This information is not intended to replace advice given to you by your health care provider. Make sure you discuss any questions you have with your health care provider. Document Released: 03/30/2016 Document Revised: 07/30/2016 Document Reviewed: 03/30/2016 Elsevier Interactive Patient Education  [employer].

## 2017-10-13 ENCOUNTER — Encounter (HOSPITAL_COMMUNITY)
Admission: RE | Admit: 2017-10-13 | Discharge: 2017-10-13 | Disposition: A | Discharge status: Home / Self Care | Payer: Medicaid Other | Source: Ambulatory Visit | Attending: Gastroenterology | Admitting: Gastroenterology

## 2017-10-15 ENCOUNTER — Encounter (HOSPITAL_COMMUNITY)
Admission: RE | Admit: 2017-10-15 | Discharge: 2017-10-15 | Disposition: A | Discharge status: Home / Self Care | Payer: Medicaid Other | Source: Ambulatory Visit | Attending: Gastroenterology | Admitting: Gastroenterology

## 2017-10-15 ENCOUNTER — Encounter (HOSPITAL_COMMUNITY): Payer: Self-pay

## 2017-10-15 DIAGNOSIS — R112 Nausea with vomiting, unspecified: Secondary | ICD-10-CM | POA: Diagnosis not present

## 2017-10-15 DIAGNOSIS — Z01810 Encounter for preprocedural cardiovascular examination: Secondary | ICD-10-CM | POA: Diagnosis present

## 2017-10-15 DIAGNOSIS — K219 Gastro-esophageal reflux disease without esophagitis: Secondary | ICD-10-CM | POA: Insufficient documentation

## 2017-10-15 LAB — CBC
HCT: 36.4 % (ref 36.0–46.0)
Hemoglobin: 11.8 g/dL — ABNORMAL LOW (ref 12.0–15.0)
MCH: 29.9 pg (ref 26.0–34.0)
MCHC: 32.4 g/dL (ref 30.0–36.0)
MCV: 92.2 fL (ref 78.0–100.0)
Platelets: 255 10*3/uL (ref 150–400)
RBC: 3.95 MIL/uL (ref 3.87–5.11)
RDW: 14 % (ref 11.5–15.5)
WBC: 7 10*3/uL (ref 4.0–10.5)

## 2017-10-15 LAB — BASIC METABOLIC PANEL
Anion gap: 11 (ref 5–15)
BUN: 8 mg/dL (ref 6–20)
CO2: 27 mmol/L (ref 22–32)
CREATININE: 0.76 mg/dL (ref 0.44–1.00)
Calcium: 9 mg/dL (ref 8.9–10.3)
Chloride: 99 mmol/L — ABNORMAL LOW (ref 101–111)
GFR calc Af Amer: >60 mL/min (ref >60)
GLUCOSE: 113 mg/dL — AB (ref 65–99)
POTASSIUM: 3.2 mmol/L — AB (ref 3.5–5.1)
SODIUM: 137 mmol/L (ref 135–145)

## 2017-10-15 LAB — SURGICAL PCR SCREEN
MRSA, PCR: NEGATIVE
Staphylococcus aureus: NEGATIVE

## 2017-10-19 ENCOUNTER — Encounter (HOSPITAL_COMMUNITY): Payer: Self-pay | Admitting: *Deleted

## 2017-10-20 ENCOUNTER — Encounter (HOSPITAL_COMMUNITY)
Admission: RE | Disposition: A | Discharge status: Home / Self Care | Payer: Self-pay | Source: Ambulatory Visit | Attending: Gastroenterology

## 2017-10-20 ENCOUNTER — Ambulatory Visit (HOSPITAL_COMMUNITY): Payer: Medicaid Other | Admitting: Certified Registered Nurse Anesthetist

## 2017-10-20 ENCOUNTER — Ambulatory Visit (HOSPITAL_COMMUNITY)
Admission: RE | Admit: 2017-10-20 | Discharge: 2017-10-20 | Disposition: A | Discharge status: Home / Self Care | Payer: Medicaid Other | Source: Ambulatory Visit | Attending: Gastroenterology | Admitting: Gastroenterology

## 2017-10-20 ENCOUNTER — Encounter (HOSPITAL_COMMUNITY): Payer: Self-pay | Admitting: Gastroenterology

## 2017-10-20 DIAGNOSIS — G8929 Other chronic pain: Secondary | ICD-10-CM | POA: Insufficient documentation

## 2017-10-20 DIAGNOSIS — Z8719 Personal history of other diseases of the digestive system: Secondary | ICD-10-CM | POA: Diagnosis not present

## 2017-10-20 DIAGNOSIS — Z882 Allergy status to sulfonamides status: Secondary | ICD-10-CM | POA: Insufficient documentation

## 2017-10-20 DIAGNOSIS — Z903 Acquired absence of stomach [part of]: Secondary | ICD-10-CM | POA: Insufficient documentation

## 2017-10-20 DIAGNOSIS — K219 Gastro-esophageal reflux disease without esophagitis: Secondary | ICD-10-CM | POA: Diagnosis not present

## 2017-10-20 DIAGNOSIS — R112 Nausea with vomiting, unspecified: Secondary | ICD-10-CM | POA: Insufficient documentation

## 2017-10-20 DIAGNOSIS — R1013 Epigastric pain: Secondary | ICD-10-CM | POA: Diagnosis present

## 2017-10-20 DIAGNOSIS — Z79899 Other long term (current) drug therapy: Secondary | ICD-10-CM | POA: Insufficient documentation

## 2017-10-20 DIAGNOSIS — F418 Other specified anxiety disorders: Secondary | ICD-10-CM | POA: Insufficient documentation

## 2017-10-20 DIAGNOSIS — F419 Anxiety disorder, unspecified: Secondary | ICD-10-CM | POA: Diagnosis not present

## 2017-10-20 DIAGNOSIS — Z9049 Acquired absence of other specified parts of digestive tract: Secondary | ICD-10-CM | POA: Diagnosis not present

## 2017-10-20 DIAGNOSIS — M549 Dorsalgia, unspecified: Secondary | ICD-10-CM | POA: Insufficient documentation

## 2017-10-20 DIAGNOSIS — F17210 Nicotine dependence, cigarettes, uncomplicated: Secondary | ICD-10-CM | POA: Insufficient documentation

## 2017-10-20 DIAGNOSIS — Z888 Allergy status to other drugs, medicaments and biological substances status: Secondary | ICD-10-CM | POA: Insufficient documentation

## 2017-10-20 DIAGNOSIS — R1310 Dysphagia, unspecified: Secondary | ICD-10-CM | POA: Diagnosis not present

## 2017-10-20 DIAGNOSIS — Z8711 Personal history of peptic ulcer disease: Secondary | ICD-10-CM | POA: Diagnosis not present

## 2017-10-20 DIAGNOSIS — Z8249 Family history of ischemic heart disease and other diseases of the circulatory system: Secondary | ICD-10-CM | POA: Insufficient documentation

## 2017-10-20 DIAGNOSIS — K222 Esophageal obstruction: Secondary | ICD-10-CM

## 2017-10-20 DIAGNOSIS — Z801 Family history of malignant neoplasm of trachea, bronchus and lung: Secondary | ICD-10-CM | POA: Insufficient documentation

## 2017-10-20 DIAGNOSIS — Q394 Esophageal web: Secondary | ICD-10-CM | POA: Diagnosis not present

## 2017-10-20 DIAGNOSIS — G629 Polyneuropathy, unspecified: Secondary | ICD-10-CM | POA: Diagnosis not present

## 2017-10-20 DIAGNOSIS — K589 Irritable bowel syndrome without diarrhea: Secondary | ICD-10-CM | POA: Diagnosis not present

## 2017-10-20 DIAGNOSIS — K2970 Gastritis, unspecified, without bleeding: Secondary | ICD-10-CM | POA: Diagnosis not present

## 2017-10-20 DIAGNOSIS — Z90710 Acquired absence of both cervix and uterus: Secondary | ICD-10-CM | POA: Insufficient documentation

## 2017-10-20 DIAGNOSIS — Z800 Family history of malignant neoplasm of digestive organs: Secondary | ICD-10-CM | POA: Diagnosis not present

## 2017-10-20 DIAGNOSIS — Z8261 Family history of arthritis: Secondary | ICD-10-CM | POA: Insufficient documentation

## 2017-10-20 DIAGNOSIS — Z7982 Long term (current) use of aspirin: Secondary | ICD-10-CM | POA: Insufficient documentation

## 2017-10-20 DIAGNOSIS — R51 Headache: Secondary | ICD-10-CM | POA: Insufficient documentation

## 2017-10-20 DIAGNOSIS — T39395A Adverse effect of other nonsteroidal anti-inflammatory drugs [NSAID], initial encounter: Secondary | ICD-10-CM | POA: Insufficient documentation

## 2017-10-20 DIAGNOSIS — Z886 Allergy status to analgesic agent status: Secondary | ICD-10-CM | POA: Diagnosis not present

## 2017-10-20 DIAGNOSIS — Z825 Family history of asthma and other chronic lower respiratory diseases: Secondary | ICD-10-CM | POA: Insufficient documentation

## 2017-10-20 DIAGNOSIS — M109 Gout, unspecified: Secondary | ICD-10-CM | POA: Diagnosis not present

## 2017-10-20 HISTORY — PX: ESOPHAGOGASTRODUODENOSCOPY (EGD) WITH PROPOFOL: SHX5813

## 2017-10-20 SURGERY — ESOPHAGOGASTRODUODENOSCOPY (EGD) WITH PROPOFOL
Anesthesia: Monitor Anesthesia Care

## 2017-10-20 MED ORDER — PROPOFOL 10 MG/ML IV BOLUS
INTRAVENOUS | Status: AC
  Filled 2017-10-20: qty 20

## 2017-10-20 MED ORDER — PROPOFOL 10 MG/ML IV BOLUS
INTRAVENOUS | Status: DC | PRN
  Administered 2017-10-20: 50 mg via INTRAVENOUS

## 2017-10-20 MED ORDER — MINERAL OIL PO OIL
TOPICAL_OIL | ORAL | Status: AC
  Filled 2017-10-20: qty 30

## 2017-10-20 MED ORDER — DEXLANSOPRAZOLE 60 MG PO CPDR: 60.0000 mg | DELAYED_RELEASE_CAPSULE | Freq: Every day | ORAL | 11 refills | Status: DC

## 2017-10-20 MED ORDER — LACTATED RINGERS IV SOLN
INTRAVENOUS | Status: DC
  Administered 2017-10-20: 12:00:00 via INTRAVENOUS

## 2017-10-20 MED ORDER — PROPOFOL 500 MG/50ML IV EMUL
INTRAVENOUS | Status: DC | PRN
  Administered 2017-10-20: 150 ug/kg/min via INTRAVENOUS

## 2017-10-20 MED ORDER — MIDAZOLAM HCL 2 MG/2ML IJ SOLN
INTRAMUSCULAR | Status: AC
  Filled 2017-10-20: qty 2

## 2017-10-20 MED ORDER — LIDOCAINE VISCOUS 2 % MT SOLN
5.0000 mL | Freq: Once | OROMUCOSAL | Status: AC
  Administered 2017-10-20: 5 mL via OROMUCOSAL

## 2017-10-20 MED ORDER — LIDOCAINE HCL (CARDIAC) 20 MG/ML IV SOLN
INTRAVENOUS | Status: DC | PRN
  Administered 2017-10-20: 50 mg via INTRAVENOUS

## 2017-10-20 MED ORDER — LIDOCAINE VISCOUS 2 % MT SOLN
OROMUCOSAL | Status: AC
  Filled 2017-10-20: qty 15

## 2017-10-20 MED ORDER — FENTANYL CITRATE (PF) 100 MCG/2ML IJ SOLN
INTRAMUSCULAR | Status: AC
  Filled 2017-10-20: qty 2

## 2017-10-20 MED ORDER — MIDAZOLAM HCL 2 MG/2ML IJ SOLN
1.0000 mg | INTRAMUSCULAR | Status: AC
  Administered 2017-10-20: 2 mg via INTRAVENOUS

## 2017-10-20 MED ORDER — FENTANYL CITRATE (PF) 100 MCG/2ML IJ SOLN
25.0000 ug | Freq: Once | INTRAMUSCULAR | Status: AC
  Administered 2017-10-20: 25 ug via INTRAVENOUS

## 2017-10-20 NOTE — H&P (Signed)
Primary Care Physician:  Neale Burly, MD Primary Gastroenterologist:  Dr. Oneida Alar  Pre-Procedure History & Physical: HPI:  Jacqueline [name] is a 66 y.o. female here for NAUSEA/VOMITING/EPIGASTRIC PAIN.   Past Medical History:  Diagnosis Date  . Anxiety   . Chronic back pain   . Drug-seeking behavior   . Gastric nodule 2009   EUS, ?leiomyoma  . 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 GERD (gastroesophageal reflux disease)   . Gout   . History of pancreatitis    Biliary and/or etoh?  . Irritable bowel syndrome   . Knee pain   . Migraines   . Migraines   . Peripheral neuropathy   . Polysubstance abuse ([place])    opiates, cocaine, marijuana    Past Surgical History:  Procedure Laterality Date  . ABDOMINAL HYSTERECTOMY    . ABDOMINAL SURGERY    . BACK SURGERY    . back surgery /ray cage fusion comberg, gso    . BIOPSY  07/31/2015   Procedure: BIOPSY (DUODENAL, GASTRIC, GASTRIC ULCER, ESOPHAGEAL);  Surgeon: Danie Binder, MD;  Location: [facility];  Service: Endoscopy;;  . CESAREAN SECTION    . CHOLECYSTECTOMY    . COLONOSCOPY  2001   Dr. Laural Golden, hemorrhoids  . COLONOSCOPY  10/21/2011   URK:YHCWCBJ polyp multiple/internal hemorrhoids  . DILATION AND CURETTAGE OF UTERUS    . ESOPHAGEAL DILATION N/A 07/31/2015   Procedure: ESOPHAGEAL DILATION WIRE GUIDED WITH SAVORY DILATORS 15MM, 16MM;  Surgeon: Danie Binder, MD;  Location: [facility];  Service: Endoscopy;  Laterality: N/A;  . ESOPHAGOGASTRODUODENOSCOPY  11/2008   A 9-mm submucosal lesion seen in the cardia., gastritis, no hpylori  . ESOPHAGOGASTRODUODENOSCOPY (EGD) WITH PROPOFOL N/A 07/31/2015   SEG:BTDVVOH ulcer and moderate gastritis, dysphagia, empirical dilation with no identified source. esophageal, gastric, duodenal bx unremarkable.   . ESOPHAGOGASTRODUODENOSCOPY (EGD) WITH PROPOFOL N/A 11/13/2015   SLF: 1. Gastric ulcer has healed. 2.  single non-bleeding gastric AVMs 3. mild non-erosive gastritis.   Marland Kitchen FOOT SURGERY    . GALLBLADDER SURGERY  2010  . 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  Marland Kitchen SAVORY DILATION  02/03/2012   YWV:PXTGGYIRS in the distal esophagus/Mild gastritis  . toe graft    . TONSILLECTOMY      Prior to Admission medications   Medication Sig Start Date End Date Taking? Authorizing Provider  acetaminophen (TYLENOL) 500 MG tablet Take 500-1,000 mg by mouth every 6 (six) hours as needed (for fever.).   Yes [provider]  albuterol (PROVENTIL HFA;VENTOLIN HFA) 108 (90 Base) MCG/ACT inhaler Inhale 2 puffs into the lungs every 6 (six) hours as needed for wheezing or shortness of breath.   Yes [provider]  cyclobenzaprine (FLEXERIL) 10 MG tablet Take 10 mg by mouth 3 (three) times daily.  09/22/17  Yes [provider]  dexlansoprazole (DEXILANT) 60 MG capsule Take 60 mg by mouth daily before breakfast.   Yes [provider]  diclofenac sodium (VOLTAREN) 1 % GEL Apply 1 g topically 4 (four) times daily as needed (for arthritis pain.).   Yes [provider]  diphenhydrAMINE (BENADRYL) 25 mg capsule Take 25 mg by mouth every 6 (six) hours as needed (for allergies.).   Yes [provider]  FLUoxetine (PROZAC) 40 MG capsule Take 40 mg by mouth daily.  09/22/17  Yes [provider]  gabapentin (NEURONTIN) 300 MG capsule Take 2 capsules (600 mg total) by mouth 3 (three) times daily. 04/30/15  Yes Niel Hummer, NP  ondansetron (ZOFRAN) 4 MG tablet Take 1 tablet (4 mg total) by mouth every 8 (eight) hours as needed for nausea or vomiting. 10/06/17  Yes Gill, Eric A, NP  SUBOXONE 8-2 MG FILM Take 1 Film by mouth 3 (three) times daily.  10/02/17  Yes [provider]    Allergies as of 10/06/2017  - Review Complete 10/06/2017  Allergen Reaction Noted  . Aspirin Other (See Comments)   . Imitrex [sumatriptan base] Other (See Comments) 09/30/2011  . Ketorolac tromethamine Other (See Comments) 09/30/2011  . Nsaids Other (See Comments) 12/18/2011  . Promethazine hcl Other (See Comments) 12/18/2011  . Sulfonamide derivatives Nausea And Vomiting     Family History  Problem Relation Age of Onset  . Colon cancer Other   . Colon cancer Mother        diagnosed in late 73s and died age 71  . Heart failure Mother   . Heart defect Unknown        family history   . Arthritis Unknown        family history  . COPD Unknown        family history   . Cancer Unknown        multiple unknown type  . Heart attack Father        deceased at 83  . Hypertension Father   . Heart failure Father   . Lung cancer Maternal Uncle   . Throat cancer Maternal Uncle   . Colon cancer Paternal Uncle   . Colon cancer Maternal Aunt   . Anesthesia problems Neg Hx   . Hypotension Neg Hx   . Malignant hyperthermia Neg Hx   . Pseudochol deficiency Neg Hx     Social History   Social History  . Marital status: Divorced    Spouse name: N/A  . Number of children: 2  . Years of education: 10th grade   Occupational History  . disabled: back problems     Social History Main Topics  . Smoking status: Current Every Day Smoker    Packs/day: 0.25    Years: 35.00    Types: Cigarettes  . Smokeless tobacco: Never Used  . Alcohol use Yes     Comment: occasional holiday drink... pt denied alcohol use on admission to [facility]  . Drug use: Yes    Frequency: 1.0 time per week    Types: Marijuana     Comment: +cocaine on 04/2015 UDS  . Sexual activity: Not on file   Other Topics Concern  . Not on file   Social History Narrative  . No narrative on file    Review of Systems: See HPI, otherwise negative ROS   Physical Exam: BP 129/81   Temp 98.1 F (36.7 C) (Oral)   Resp (!) 25   SpO2 100%  General:    Alert,  pleasant and cooperative in NAD Head:  Normocephalic and atraumatic. Neck:  Supple; Lungs:  Clear throughout to auscultation.    Heart:  Regular rate and rhythm. Abdomen:  Soft, nontender and nondistended. Normal bowel sounds, without guarding, and without rebound.   Neurologic:  Alert and  oriented x4;  grossly normal neurologically.  Impression/Plan:     NAUSEA/VOMITING/abdominal pain.  PLAN: EGD TODAY DISCUSSED PROCEDURE, BENEFITS, & RISKS: < 1% chance of medication reaction, OR bleeding.

## 2017-10-20 NOTE — Anesthesia Postprocedure Evaluation (Signed)
Anesthesia Post Note  Patient: Jacqueline [name]  Procedure(s) Performed: ESOPHAGOGASTRODUODENOSCOPY (EGD) WITH PROPOFOL (N/A )  Patient location during evaluation: Endoscopy Anesthesia Type: MAC Level of consciousness: awake and alert Pain management: pain level controlled Vital Signs Assessment: post-procedure vital signs reviewed and stable Respiratory status: spontaneous breathing, nonlabored ventilation, respiratory function stable and patient connected to nasal cannula oxygen Cardiovascular status: blood pressure returned to baseline and stable Postop Assessment: no apparent nausea or vomiting Anesthetic complications: no     Last Vitals:  Vitals:   10/20/17 1345 10/20/17 1353  BP: (!) 149/82 (!) 161/82  Pulse: (!) 51 (!) 53  Resp: 14 14  Temp:  36.6 C  SpO2: 95% 97%    Last Pain:  Vitals:   10/20/17 1353  TempSrc: Oral  PainSc:                  Talitha Givens

## 2017-10-20 NOTE — Anesthesia Preprocedure Evaluation (Signed)
Anesthesia Evaluation  Patient identified by MRN, date of birth, ID band Patient awake    Reviewed: Allergy & Precautions, H&P , NPO status , Patient's Chart, lab work & pertinent test results  History of Anesthesia Complications Negative for: history of anesthetic complications  Airway Mallampati: I  TM Distance: >3 FB     Dental  (+) Edentulous Upper, Teeth Intact   Pulmonary Current Smoker,    Pulmonary exam normal        Cardiovascular negative cardio ROS   Rhythm:Regular Rate:Normal     Neuro/Psych  Headaches, PSYCHIATRIC DISORDERS Anxiety Depression  Neuromuscular disease    GI/Hepatic PUD, GERD  Medicated and Controlled,  Endo/Other    Renal/GU      Musculoskeletal   Abdominal   Peds  Hematology   Anesthesia Other Findings   Reproductive/Obstetrics                             Anesthesia Physical Anesthesia Plan  ASA: II  Anesthesia Plan: MAC   Post-op Pain Management:    Induction: Intravenous  PONV Risk Score and Plan:   Airway Management Planned: Simple Face Mask  Additional Equipment:   Intra-op Plan:   Post-operative Plan:   Informed Consent: I have reviewed the patients History and Physical, chart, labs and discussed the procedure including the risks, benefits and alternatives for the proposed anesthesia with the patient or authorized representative who has indicated his/her understanding and acceptance.     Plan Discussed with:   Anesthesia Plan Comments:         Anesthesia Quick Evaluation

## 2017-10-20 NOTE — Transfer of Care (Signed)
Immediate Anesthesia Transfer of Care Note  Patient: Jacqueline [name]  Procedure(s) Performed: ESOPHAGOGASTRODUODENOSCOPY (EGD) WITH PROPOFOL (N/A )  Patient Location: PACU  Anesthesia Type:MAC  Level of Consciousness: awake, alert  and oriented  Airway & Oxygen Therapy: Patient Spontanous Breathing and Patient connected to nasal cannula oxygen  Post-op Assessment: Report given to RN, Post -op Vital signs reviewed and stable and Patient moving all extremities X 4  Post vital signs: Reviewed and stable  Last Vitals:  Vitals:   10/20/17 1230 10/20/17 1310  BP: 129/81 112/79  Pulse:  (!) 51  Resp: (!) 25 15  Temp:    SpO2: 100% 93%    Last Pain:  Vitals:   10/20/17 1154  TempSrc: Oral         Complications: No apparent anesthesia complications

## 2017-10-20 NOTE — Op Note (Signed)
[facility] Patient Name: Jacqueline [name] Procedure Date: 10/20/2017 12:28 PM MRN: [id number] Date of Birth: 04/05/1951 Attending MD: Barney Drain MD, MD CSN: [id number] Age: 66 Admit Type: Outpatient Procedure:                Upper GI endoscopy with esophageal dilation Indications:              Epigastric abdominal pain, Nausea with vomitingon                            ON PRILOSEC BID AND Ibuprofen. LAST EGD SHOWED ESO                            Bx WITH REFLUX CHANGES IN DISTAL AND PROXIMAL                            ESOPHAGUS. Providers:                Barney Drain MD, MD, Jeanann Lewandowsky. Sharon Seller, RN, Aram Candela Referring MD:             Stoney Bang MD, MD Medicines:                Propofol per Anesthesia Complications:            No immediate complications. Estimated Blood Loss:     Estimated blood loss was minimal                           . Procedure:                Pre-Anesthesia Assessment:                           - Prior to the procedure, a History and Physical                            was performed, and patient medications and                            allergies were reviewed. The patient's tolerance of                            previous anesthesia was also reviewed. The risks                            and benefits of the procedure and the sedation                            options and risks were discussed with the patient.                            All questions were answered, and informed consent  was obtained. Prior Anticoagulants: The patient has                            taken previous NSAID medication. ASA Grade                            Assessment: II - A patient with mild systemic                            disease. After reviewing the risks and benefits,                            the patient was deemed in satisfactory condition to                            undergo the procedure. After obtaining  informed                            consent, the endoscope was passed under direct                            vision. Throughout the procedure, the patient's                            blood pressure, pulse, and oxygen saturations were                            monitored continuously. The [id number] ([id number])                            scope was introduced through the mouth, and                            advanced to the second part of duodenum. The upper                            GI endoscopy was accomplished without difficulty.                            The patient tolerated the procedure well. Scope In: 12:56:53 PM Scope Out: 1:02:43 PM Total Procedure Duration: 0 hours 5 minutes 50 seconds  Findings:      A web was found in the upper third of the esophagus. A guidewire was       placed and the scope was withdrawn. Dilation was performed with a Savary       dilator with mild resistance at 16 mm and 17 mm. Estimated blood loss       was minimal.      Localized mild inflammation characterized by congestion (edema) and       erythema was found in the gastric antrum.      The examined duodenum was normal. Impression:               - Web in the upper third of the esophagus. Dilated.                           -  MILD Gastritis DUE TO NSAIDS.                           - NAUSEA/VOMITING AND ABDOMINAL PAIN MOST LIKELY                            DUE TO UNCONTROLLED GERD/GASTRITIS Moderate Sedation:      Per Anesthesia Care Recommendation:           - High fiber diet and low fat diet. AVOID REFLUX                            TRIGGER.                           - Continue present medications. MINIMIZE USE OF                            NSAIDS.                           - Low fat diet.                           - Return to my office in 3 months.                           - Patient has a contact number available for                            emergencies. The signs and symptoms of potential                             delayed complications were discussed with the                            patient. Return to normal activities tomorrow.                            Written discharge instructions were provided to the                            patient. Procedure Code(s):        --- Professional ---                           [phone], Esophagogastroduodenoscopy, flexible,                            transoral; with insertion of guide wire followed by                            passage of dilator(s) through esophagus over guide                            wire Diagnosis Code(s):        --- Professional ---  Q39.4, Esophageal web                           K29.70, Gastritis, unspecified, without bleeding                           R10.13, Epigastric pain                           R11.2, Nausea with vomiting, unspecified CPT copyright 2016 [employer]. All rights reserved. The codes documented in this report are preliminary and upon coder review may  be revised to meet current compliance requirements. Barney Drain, MD Barney Drain MD, MD 10/20/2017 1:17:03 PM This report has been signed electronically. Number of Addenda: 0

## 2017-10-20 NOTE — Discharge Instructions (Signed)
I dilated your esophagus. You have a stricture near the top of your esophagus. You have gastritis.     AVOID REFLUX TRIGGERS. SEE INFO BELOW.  FOLLOW A LOW FAT DIET. SEE INFO BELOW.  CONTINUE DEXILANT.   TAKE PEPCID OR ZANTAC HELP MOST WHEN NEEDED TO CONTROL HEARTBURN OR INDIGESTION.    FOLLOW UP IN 3 MOS.   UPPER ENDOSCOPY AFTER CARE Read the instructions outlined below and refer to this sheet in the next week. These discharge instructions provide you with general information on caring for yourself after you leave the hospital. While your treatment has been planned according to the most current medical practices available, unavoidable complications occasionally occur. If you have any problems or questions after discharge, call DR. Dyon Rotert, [phone].  ACTIVITY  You may resume your regular activity, but move at a slower pace for the next 24 hours.   Take frequent rest periods for the next 24 hours.   Walking will help get rid of the air and reduce the bloated feeling in your belly (abdomen).   No driving for 24 hours (because of the medicine (anesthesia) used during the test).   You may shower.   Do not sign any important legal documents or operate any machinery for 24 hours (because of the anesthesia used during the test).    NUTRITION  Drink plenty of fluids.   You may resume your normal diet as instructed by your doctor.   Begin with a light meal and progress to your normal diet. Heavy or fried foods are harder to digest and may make you feel sick to your stomach (nauseated).   Avoid alcoholic beverages for 24 hours or as instructed.    MEDICATIONS  You may resume your normal medications.   WHAT YOU CAN EXPECT TODAY  Some feelings of bloating in the abdomen.   Passage of more gas than usual.    IF YOU HAD A BIOPSY TAKEN DURING THE UPPER ENDOSCOPY:  Eat a soft diet IF YOU HAVE NAUSEA, BLOATING, ABDOMINAL PAIN, OR VOMITING.    FINDING OUT THE RESULTS  OF YOUR TEST Not all test results are available during your visit. DR. Oneida Alar WILL CALL YOU WITHIN 7 DAYS OF YOUR PROCEDUE WITH YOUR RESULTS. Do not assume everything is normal if you have not heard from DR. Jake Goodson IN ONE WEEK, CALL HER OFFICE AT [phone].  SEEK IMMEDIATE MEDICAL ATTENTION AND CALL THE OFFICE: [phone] IF:  You have more than a spotting of blood in your stool.   Your belly is swollen (abdominal distention).   You are nauseated or vomiting.   You have a temperature over 101F.   You have abdominal pain or discomfort that is severe or gets worse throughout the day.    Low-Fat Diet BREADS, CEREALS, PASTA, RICE, DRIED PEAS, AND BEANS These products are high in carbohydrates and most are low in fat. Therefore, they can be increased in the diet as substitutes for fatty foods. They too, however, contain calories and should not be eaten in excess. Cereals can be eaten for snacks as well as for breakfast.  Include foods that contain fiber (fruits, vegetables, whole grains, and legumes). Research shows that fiber may lower blood cholesterol levels, especially the water-soluble fiber found in fruits, vegetables, oat products, and legumes. FRUITS AND VEGETABLES It is good to eat fruits and vegetables. Besides being sources of fiber, both are rich in vitamins and some minerals. They help you get the daily allowances of these nutrients. Fruits and vegetables  can be used for snacks and desserts. MEATS Limit lean meat, chicken, [place], and fish to no more than 6 ounces per day. Beef, Pork, and Lamb Use lean cuts of beef, pork, and lamb. Lean cuts include:  Extra-lean ground beef.  Arm roast.  Sirloin tip.  Center-cut ham.  Round steak.  Loin chops.  Rump roast.  Tenderloin.  Trim all fat off the outside of meats before cooking. It is not necessary to severely decrease the intake of red meat, but lean choices should be made. Lean meat is rich in protein and contains a highly  absorbable form of iron. Premenopausal women, in particular, should avoid reducing lean red meat because this could increase the risk for low red blood cells (iron-deficiency anemia).  Chicken and [place] These are good sources of protein. The fat of poultry can be reduced by removing the skin and underlying fat layers before cooking. Chicken and [place] can be substituted for lean red meat in the diet. Poultry should not be fried or covered with high-fat sauces. Fish and Shellfish Fish is a good source of protein. Shellfish contain cholesterol, but they usually are low in saturated fatty acids. The preparation of fish is important. Like chicken and [place], they should not be fried or covered with high-fat sauces. EGGS Egg whites contain no fat or cholesterol. They can be eaten often. Try 1 to 2 egg whites instead of whole eggs in recipes or use egg substitutes that do not contain yolk.  MILK AND DAIRY PRODUCTS Use skim or 1% milk instead of 2% or whole milk. Decrease whole milk, natural, and processed cheeses. Use nonfat or low-fat (2%) cottage cheese or low-fat cheeses made from vegetable oils. Choose nonfat or low-fat (1 to 2%) yogurt. Experiment with evaporated skim milk in recipes that call for heavy cream. Substitute low-fat yogurt or low-fat cottage cheese for sour cream in dips and salad dressings. Have at least 2 servings of low-fat dairy products, such as 2 glasses of skim (or 1%) milk each day to help get your daily calcium intake.  FATS AND OILS Butterfat, lard, and beef fats are high in saturated fat and cholesterol. These should be avoided.Vegetable fats do not contain cholesterol. AVOID coconut oil, palm oil, and palm kernel oil, WHICH are very high in saturated fats. These should be limited. These fats are often used in bakery goods, processed foods, popcorn, oils, and nondairy creamers. Vegetable shortenings and some peanut butters contain hydrogenated oils, which are also saturated fats.  Read the labels on these foods and check for saturated vegetable oils.  Desirable liquid vegetable oils are corn oil, cottonseed oil, olive oil, canola oil, safflower oil, soybean oil, and sunflower oil. Peanut oil is not as good, but small amounts are acceptable. Buy a heart-healthy tub margarine that has no partially hydrogenated oils in the ingredients. AVOID Mayonnaise and salad dressings often are made from unsaturated fats.  OTHER EATING TIPS Snacks  Most sweets should be limited as snacks. They tend to be rich in calories and fats, and their caloric content outweighs their nutritional value. Some good choices in snacks are graham crackers, melba toast, soda crackers, bagels (no egg), English muffins, fruits, and vegetables. These snacks are preferable to snack crackers, [place] fries, and chips. Popcorn should be air-popped or cooked in small amounts of liquid vegetable oil.  Desserts Eat fruit, low-fat yogurt, and fruit ices instead of pastries, cake, and cookies. Sherbet, angel food cake, gelatin dessert, frozen low-fat yogurt, or other frozen products  that do not contain saturated fat (pure fruit juice bars, frozen ice pops) are also acceptable.   COOKING METHODS Choose those methods that use little or no fat. They include: Poaching.  Braising.  Steaming.  Grilling.  Baking.  Stir-frying.  Broiling.  Microwaving.  Foods can be cooked in a nonstick pan without added fat, or use a nonfat cooking spray in regular cookware. Limit fried foods and avoid frying in saturated fat. Add moisture to lean meats by using water, broth, cooking wines, and other nonfat or low-fat sauces along with the cooking methods mentioned above. Soups and stews should be chilled after cooking. The fat that forms on top after a few hours in the refrigerator should be skimmed off. When preparing meals, avoid using excess salt. Salt can contribute to raising blood pressure in some people.  EATING AWAY FROM  HOME Order entres, potatoes, and vegetables without sauces or butter. When meat exceeds the size of a deck of cards (3 to 4 ounces), the rest can be taken home for another meal. Choose vegetable or fruit salads and ask for low-calorie salad dressings to be served on the side. Use dressings sparingly. Limit high-fat toppings, such as bacon, crumbled eggs, cheese, sunflower seeds, and olives. Ask for heart-healthy tub margarine instead of butter.   Gastritis  Gastritis is an inflammation (the body's way of reacting to injury and/or infection) of the stomach. It is often caused by viral or bacterial (germ) infections. It can also be caused BY ASPIRIN, BC/GOODY POWDER'S, (IBUPROFEN) MOTRIN, OR ALEVE (NAPROXEN), chemicals (including alcohol), SPICY FOODS, and medications. This illness may be associated with generalized malaise (feeling tired, not well), UPPER ABDOMINAL STOMACH cramps, and fever. One common bacterial cause of gastritis is an organism known as H. Pylori. This can be treated with antibiotics.   ESOPHAGEAL WEB Esophageal strictures can be caused by stomach acid backing up into the tube that carries food from the mouth down to the stomach (lower esophagus).  TREATMENT There are a number of medicines used to treat reflux/stricture, including: Antacids.  Proton-pump inhibitors: DEXILANT ZANTAC OR PEPCID  HOME CARE INSTRUCTIONS Eat 2-3 hours before going to bed.  Try to reach and maintain a healthy weight.  Do not eat just a few very large meals. Instead, eat 4 TO 6 smaller meals throughout the day.  Try to identify foods and beverages that make your symptoms worse, and avoid these.  Avoid tight clothing.  Do not exercise right after eating.

## 2017-10-21 ENCOUNTER — Encounter (HOSPITAL_COMMUNITY): Payer: Self-pay | Admitting: Gastroenterology

## 2017-10-21 NOTE — Progress Notes (Signed)
Pt is aware.  

## 2017-10-22 ENCOUNTER — Other Ambulatory Visit: Payer: Self-pay

## 2017-10-22 DIAGNOSIS — I872 Venous insufficiency (chronic) (peripheral): Secondary | ICD-10-CM

## 2017-10-26 ENCOUNTER — Telehealth: Payer: Self-pay

## 2017-10-26 MED ORDER — DEXLANSOPRAZOLE 60 MG PO CPDR: 60.0000 mg | DELAYED_RELEASE_CAPSULE | Freq: Every day | ORAL | 11 refills | Status: DC

## 2017-10-26 NOTE — Telephone Encounter (Signed)
Please tell the patient that now that the prior auth been completed, the prescription has been sent to her pharmacy.

## 2017-10-26 NOTE — Telephone Encounter (Signed)
PA was done and approved. Approval has been faxed to the pharmacy.

## 2017-10-26 NOTE — Telephone Encounter (Signed)
Pt said she is out of the [place] samples and the pharmacy said she needs PA.  I told her I will check with Almy[facility] and see if we have received the PA request. I am leaving her one box of Dexilant 60 mg until this is done.

## 2017-10-26 NOTE — Telephone Encounter (Signed)
Pt is aware.  

## 2017-11-11 ENCOUNTER — Other Ambulatory Visit: Payer: Self-pay | Admitting: Nurse Practitioner

## 2017-11-11 DIAGNOSIS — K219 Gastro-esophageal reflux disease without esophagitis: Secondary | ICD-10-CM

## 2017-11-11 DIAGNOSIS — K5900 Constipation, unspecified: Secondary | ICD-10-CM

## 2017-11-11 DIAGNOSIS — R112 Nausea with vomiting, unspecified: Secondary | ICD-10-CM

## 2017-11-16 ENCOUNTER — Ambulatory Visit (HOSPITAL_COMMUNITY): Payer: Medicaid Other

## 2017-11-16 ENCOUNTER — Ambulatory Visit: Payer: Medicaid Other | Admitting: Vascular Surgery

## 2018-01-06 ENCOUNTER — Encounter: Payer: Self-pay | Admitting: Gastroenterology

## 2018-01-06 ENCOUNTER — Telehealth: Payer: Self-pay | Admitting: Nurse Practitioner

## 2018-01-06 ENCOUNTER — Ambulatory Visit: Payer: Medicaid Other | Admitting: Nurse Practitioner

## 2018-01-06 NOTE — Telephone Encounter (Signed)
Noted  

## 2018-01-06 NOTE — Telephone Encounter (Signed)
PATIENT WAS A NO SHOW AND LETTER SENT  °

## 2018-03-27 ENCOUNTER — Encounter (HOSPITAL_COMMUNITY): Payer: Self-pay | Admitting: Emergency Medicine

## 2018-03-27 ENCOUNTER — Other Ambulatory Visit: Payer: Self-pay

## 2018-03-27 ENCOUNTER — Inpatient Hospital Stay (HOSPITAL_COMMUNITY)
Admission: EM | Admit: 2018-03-27 | Discharge: 2018-04-06 | DRG: 217 | Disposition: A | Discharge status: Home / Self Care | Payer: Medicaid Other | Attending: Thoracic Surgery (Cardiothoracic Vascular Surgery) | Admitting: Thoracic Surgery (Cardiothoracic Vascular Surgery)

## 2018-03-27 ENCOUNTER — Emergency Department (HOSPITAL_COMMUNITY): Payer: Medicaid Other

## 2018-03-27 ENCOUNTER — Observation Stay (HOSPITAL_BASED_OUTPATIENT_CLINIC_OR_DEPARTMENT_OTHER): Payer: Medicaid Other

## 2018-03-27 DIAGNOSIS — D62 Acute posthemorrhagic anemia: Secondary | ICD-10-CM | POA: Diagnosis not present

## 2018-03-27 DIAGNOSIS — I340 Nonrheumatic mitral (valve) insufficiency: Secondary | ICD-10-CM | POA: Diagnosis present

## 2018-03-27 DIAGNOSIS — F332 Major depressive disorder, recurrent severe without psychotic features: Secondary | ICD-10-CM | POA: Diagnosis present

## 2018-03-27 DIAGNOSIS — R7989 Other specified abnormal findings of blood chemistry: Secondary | ICD-10-CM | POA: Diagnosis not present

## 2018-03-27 DIAGNOSIS — I25110 Atherosclerotic heart disease of native coronary artery with unstable angina pectoris: Secondary | ICD-10-CM | POA: Diagnosis present

## 2018-03-27 DIAGNOSIS — R0609 Other forms of dyspnea: Secondary | ICD-10-CM | POA: Diagnosis present

## 2018-03-27 DIAGNOSIS — Z9889 Other specified postprocedural states: Secondary | ICD-10-CM

## 2018-03-27 DIAGNOSIS — Z800 Family history of malignant neoplasm of digestive organs: Secondary | ICD-10-CM

## 2018-03-27 DIAGNOSIS — R0602 Shortness of breath: Secondary | ICD-10-CM | POA: Diagnosis not present

## 2018-03-27 DIAGNOSIS — G894 Chronic pain syndrome: Secondary | ICD-10-CM | POA: Diagnosis present

## 2018-03-27 DIAGNOSIS — F4310 Post-traumatic stress disorder, unspecified: Secondary | ICD-10-CM | POA: Diagnosis present

## 2018-03-27 DIAGNOSIS — E876 Hypokalemia: Secondary | ICD-10-CM | POA: Diagnosis present

## 2018-03-27 DIAGNOSIS — Z9049 Acquired absence of other specified parts of digestive tract: Secondary | ICD-10-CM

## 2018-03-27 DIAGNOSIS — Z720 Tobacco use: Secondary | ICD-10-CM | POA: Diagnosis not present

## 2018-03-27 DIAGNOSIS — G629 Polyneuropathy, unspecified: Secondary | ICD-10-CM | POA: Diagnosis present

## 2018-03-27 DIAGNOSIS — K589 Irritable bowel syndrome without diarrhea: Secondary | ICD-10-CM | POA: Diagnosis present

## 2018-03-27 DIAGNOSIS — K219 Gastro-esophageal reflux disease without esophagitis: Secondary | ICD-10-CM | POA: Diagnosis present

## 2018-03-27 DIAGNOSIS — D151 Benign neoplasm of heart: Principal | ICD-10-CM | POA: Diagnosis present

## 2018-03-27 DIAGNOSIS — Z9689 Presence of other specified functional implants: Secondary | ICD-10-CM

## 2018-03-27 DIAGNOSIS — M549 Dorsalgia, unspecified: Secondary | ICD-10-CM | POA: Diagnosis present

## 2018-03-27 DIAGNOSIS — R739 Hyperglycemia, unspecified: Secondary | ICD-10-CM | POA: Diagnosis not present

## 2018-03-27 DIAGNOSIS — R0789 Other chest pain: Secondary | ICD-10-CM | POA: Diagnosis not present

## 2018-03-27 DIAGNOSIS — M1710 Unilateral primary osteoarthritis, unspecified knee: Secondary | ICD-10-CM | POA: Diagnosis present

## 2018-03-27 DIAGNOSIS — I509 Heart failure, unspecified: Secondary | ICD-10-CM

## 2018-03-27 DIAGNOSIS — F17210 Nicotine dependence, cigarettes, uncomplicated: Secondary | ICD-10-CM | POA: Diagnosis present

## 2018-03-27 DIAGNOSIS — Z8249 Family history of ischemic heart disease and other diseases of the circulatory system: Secondary | ICD-10-CM

## 2018-03-27 DIAGNOSIS — I5032 Chronic diastolic (congestive) heart failure: Secondary | ICD-10-CM | POA: Diagnosis present

## 2018-03-27 DIAGNOSIS — Z801 Family history of malignant neoplasm of trachea, bronchus and lung: Secondary | ICD-10-CM

## 2018-03-27 DIAGNOSIS — I10 Essential (primary) hypertension: Secondary | ICD-10-CM | POA: Diagnosis present

## 2018-03-27 DIAGNOSIS — I200 Unstable angina: Secondary | ICD-10-CM

## 2018-03-27 DIAGNOSIS — K5900 Constipation, unspecified: Secondary | ICD-10-CM | POA: Diagnosis not present

## 2018-03-27 DIAGNOSIS — K279 Peptic ulcer, site unspecified, unspecified as acute or chronic, without hemorrhage or perforation: Secondary | ICD-10-CM | POA: Diagnosis not present

## 2018-03-27 DIAGNOSIS — Z882 Allergy status to sulfonamides status: Secondary | ICD-10-CM

## 2018-03-27 DIAGNOSIS — Z86018 Personal history of other benign neoplasm: Secondary | ICD-10-CM

## 2018-03-27 DIAGNOSIS — Z90710 Acquired absence of both cervix and uterus: Secondary | ICD-10-CM

## 2018-03-27 DIAGNOSIS — J449 Chronic obstructive pulmonary disease, unspecified: Secondary | ICD-10-CM

## 2018-03-27 DIAGNOSIS — Z886 Allergy status to analgesic agent status: Secondary | ICD-10-CM

## 2018-03-27 DIAGNOSIS — I110 Hypertensive heart disease with heart failure: Secondary | ICD-10-CM | POA: Diagnosis present

## 2018-03-27 DIAGNOSIS — Z808 Family history of malignant neoplasm of other organs or systems: Secondary | ICD-10-CM

## 2018-03-27 DIAGNOSIS — Z888 Allergy status to other drugs, medicaments and biological substances status: Secondary | ICD-10-CM

## 2018-03-27 DIAGNOSIS — J4489 Other specified chronic obstructive pulmonary disease: Secondary | ICD-10-CM

## 2018-03-27 DIAGNOSIS — D219 Benign neoplasm of connective and other soft tissue, unspecified: Secondary | ICD-10-CM | POA: Diagnosis present

## 2018-03-27 DIAGNOSIS — G43909 Migraine, unspecified, not intractable, without status migrainosus: Secondary | ICD-10-CM | POA: Diagnosis present

## 2018-03-27 DIAGNOSIS — M109 Gout, unspecified: Secondary | ICD-10-CM | POA: Diagnosis present

## 2018-03-27 DIAGNOSIS — F419 Anxiety disorder, unspecified: Secondary | ICD-10-CM | POA: Diagnosis present

## 2018-03-27 LAB — COMPREHENSIVE METABOLIC PANEL
ALBUMIN: 3.8 g/dL (ref 3.5–5.0)
ALK PHOS: 74 U/L (ref 38–126)
ALT: 9 U/L — ABNORMAL LOW (ref 14–54)
ANION GAP: 10 (ref 5–15)
AST: 17 U/L (ref 15–41)
BILIRUBIN TOTAL: 0.4 mg/dL (ref 0.3–1.2)
BUN: 8 mg/dL (ref 6–20)
CO2: 29 mmol/L (ref 22–32)
Calcium: 8.8 mg/dL — ABNORMAL LOW (ref 8.9–10.3)
Chloride: 98 mmol/L — ABNORMAL LOW (ref 101–111)
Creatinine, Ser: 0.81 mg/dL (ref 0.44–1.00)
GFR calc Af Amer: >60 mL/min (ref >60)
GLUCOSE: 93 mg/dL (ref 65–99)
Potassium: 3.4 mmol/L — ABNORMAL LOW (ref 3.5–5.1)
Sodium: 137 mmol/L (ref 135–145)
TOTAL PROTEIN: 6.9 g/dL (ref 6.5–8.1)

## 2018-03-27 LAB — CBG MONITORING, ED: Glucose-Capillary: 100 mg/dL — ABNORMAL HIGH (ref 65–99)

## 2018-03-27 LAB — RAPID URINE DRUG SCREEN, HOSP PERFORMED
Amphetamines: NOT DETECTED
BARBITURATES: NOT DETECTED
Benzodiazepines: NOT DETECTED
Cocaine: NOT DETECTED
OPIATES: NOT DETECTED
TETRAHYDROCANNABINOL: POSITIVE — AB

## 2018-03-27 LAB — TROPONIN I
Troponin I: <0.03 ng/mL (ref <0.03)
Troponin I: <0.03 ng/mL (ref <0.03)

## 2018-03-27 LAB — CBC
HCT: 34.6 % — ABNORMAL LOW (ref 36.0–46.0)
HEMOGLOBIN: 10.9 g/dL — AB (ref 12.0–15.0)
MCH: 29.8 pg (ref 26.0–34.0)
MCHC: 31.5 g/dL (ref 30.0–36.0)
MCV: 94.5 fL (ref 78.0–100.0)
Platelets: 239 10*3/uL (ref 150–400)
RBC: 3.66 MIL/uL — ABNORMAL LOW (ref 3.87–5.11)
RDW: 14.1 % (ref 11.5–15.5)
WBC: 5.1 10*3/uL (ref 4.0–10.5)

## 2018-03-27 LAB — ECHOCARDIOGRAM COMPLETE
Height: 62 in
WEIGHTICAEL: 2000 [oz_av]

## 2018-03-27 LAB — TSH: TSH: 1.694 u[IU]/mL (ref 0.350–4.500)

## 2018-03-27 LAB — PROTIME-INR
INR: 1.02
PROTHROMBIN TIME: 13.3 s (ref 11.4–15.2)

## 2018-03-27 LAB — HEMOGLOBIN A1C
HEMOGLOBIN A1C: 5.5 % (ref 4.8–5.6)
Mean Plasma Glucose: 111.15 mg/dL

## 2018-03-27 LAB — BRAIN NATRIURETIC PEPTIDE: B Natriuretic Peptide: 400 pg/mL — ABNORMAL HIGH (ref 0.0–100.0)

## 2018-03-27 MED ORDER — NITROGLYCERIN 0.4 MG SL SUBL: 0.4000 mg | SUBLINGUAL_TABLET | SUBLINGUAL | Status: DC | PRN

## 2018-03-27 MED ORDER — DOCUSATE SODIUM 100 MG PO CAPS
100.0000 mg | ORAL_CAPSULE | Freq: Two times a day (BID) | ORAL | Status: DC
  Administered 2018-03-27 – 2018-03-31 (×9): 100 mg via ORAL
  Filled 2018-03-27 (×10): qty 1

## 2018-03-27 MED ORDER — ACETAMINOPHEN 325 MG PO TABS: 650.0000 mg | ORAL_TABLET | ORAL | Status: DC | PRN

## 2018-03-27 MED ORDER — CARVEDILOL 6.25 MG PO TABS
6.2500 mg | ORAL_TABLET | Freq: Two times a day (BID) | ORAL | Status: DC
  Administered 2018-03-27 – 2018-03-31 (×9): 6.25 mg via ORAL
  Filled 2018-03-27 (×8): qty 1
  Filled 2018-03-27: qty 2

## 2018-03-27 MED ORDER — PANTOPRAZOLE SODIUM 40 MG PO TBEC
40.0000 mg | DELAYED_RELEASE_TABLET | Freq: Every day | ORAL | Status: DC
  Administered 2018-03-27 – 2018-03-31 (×5): 40 mg via ORAL
  Filled 2018-03-27 (×5): qty 1

## 2018-03-27 MED ORDER — LORATADINE 10 MG PO TABS
10.0000 mg | ORAL_TABLET | Freq: Every day | ORAL | Status: DC
  Administered 2018-03-27 – 2018-03-31 (×5): 10 mg via ORAL
  Filled 2018-03-27 (×5): qty 1

## 2018-03-27 MED ORDER — MORPHINE SULFATE (PF) 2 MG/ML IV SOLN: 2.0000 mg | INTRAVENOUS | Status: DC | PRN

## 2018-03-27 MED ORDER — BUPRENORPHINE HCL-NALOXONE HCL 8-2 MG SL SUBL
1.0000 | SUBLINGUAL_TABLET | Freq: Three times a day (TID) | SUBLINGUAL | Status: DC
  Administered 2018-03-27 – 2018-03-31 (×14): 1 via SUBLINGUAL
  Filled 2018-03-27 (×14): qty 1

## 2018-03-27 MED ORDER — FUROSEMIDE 10 MG/ML IJ SOLN
20.0000 mg | Freq: Once | INTRAMUSCULAR | Status: AC
  Administered 2018-03-27: 20 mg via INTRAVENOUS
  Filled 2018-03-27: qty 2

## 2018-03-27 MED ORDER — ACETAMINOPHEN 325 MG PO TABS
650.0000 mg | ORAL_TABLET | Freq: Four times a day (QID) | ORAL | Status: DC | PRN
  Administered 2018-03-29: 650 mg via ORAL
  Filled 2018-03-27 (×2): qty 2

## 2018-03-27 MED ORDER — CYCLOBENZAPRINE HCL 10 MG PO TABS
10.0000 mg | ORAL_TABLET | Freq: Three times a day (TID) | ORAL | Status: DC | PRN
  Administered 2018-03-30 – 2018-04-01 (×3): 10 mg via ORAL
  Filled 2018-03-27 (×3): qty 1

## 2018-03-27 MED ORDER — ACETAMINOPHEN 325 MG PO TABS
650.0000 mg | ORAL_TABLET | Freq: Four times a day (QID) | ORAL | Status: DC | PRN
  Administered 2018-03-27: 650 mg via ORAL

## 2018-03-27 MED ORDER — ENOXAPARIN SODIUM 40 MG/0.4ML ~~LOC~~ SOLN
40.0000 mg | SUBCUTANEOUS | Status: DC
  Administered 2018-03-27 – 2018-03-31 (×5): 40 mg via SUBCUTANEOUS
  Filled 2018-03-27 (×6): qty 0.4

## 2018-03-27 MED ORDER — NICOTINE 14 MG/24HR TD PT24: 14.0000 mg | MEDICATED_PATCH | Freq: Every day | TRANSDERMAL | Status: DC

## 2018-03-27 MED ORDER — FLUOXETINE HCL 20 MG PO CAPS
40.0000 mg | ORAL_CAPSULE | Freq: Every day | ORAL | Status: DC
  Administered 2018-03-27 – 2018-03-31 (×5): 40 mg via ORAL
  Filled 2018-03-27 (×5): qty 2

## 2018-03-27 MED ORDER — LISINOPRIL 5 MG PO TABS
5.0000 mg | ORAL_TABLET | Freq: Every day | ORAL | Status: DC
  Administered 2018-03-27 – 2018-03-31 (×5): 5 mg via ORAL
  Filled 2018-03-27 (×5): qty 1

## 2018-03-27 MED ORDER — SODIUM CHLORIDE 0.9 % IV SOLN
INTRAVENOUS | Status: DC
  Administered 2018-03-27: 16:00:00 via INTRAVENOUS

## 2018-03-27 MED ORDER — IPRATROPIUM-ALBUTEROL 0.5-2.5 (3) MG/3ML IN SOLN
3.0000 mL | Freq: Four times a day (QID) | RESPIRATORY_TRACT | Status: DC | PRN
Start: 2018-03-27 — End: 2018-03-28

## 2018-03-27 MED ORDER — ONDANSETRON HCL 4 MG/2ML IJ SOLN: 4.0000 mg | Freq: Four times a day (QID) | INTRAMUSCULAR | Status: DC | PRN

## 2018-03-27 MED ORDER — POLYETHYLENE GLYCOL 3350 17 G PO PACK
17.0000 g | PACK | Freq: Every day | ORAL | Status: DC | PRN
  Filled 2018-03-27: qty 1

## 2018-03-27 MED ORDER — ASPIRIN EC 81 MG PO TBEC
81.0000 mg | DELAYED_RELEASE_TABLET | Freq: Every day | ORAL | Status: DC
  Administered 2018-03-27 – 2018-03-31 (×5): 81 mg via ORAL
  Filled 2018-03-27 (×5): qty 1

## 2018-03-27 MED ORDER — GABAPENTIN 300 MG PO CAPS
600.0000 mg | ORAL_CAPSULE | Freq: Three times a day (TID) | ORAL | Status: DC
  Administered 2018-03-27 – 2018-03-31 (×14): 600 mg via ORAL
  Filled 2018-03-27 (×14): qty 2

## 2018-03-27 MED ORDER — ASPIRIN EC 81 MG PO TBEC: 81.0000 mg | DELAYED_RELEASE_TABLET | Freq: Every day | ORAL | Status: DC

## 2018-03-27 MED ORDER — NITROGLYCERIN 2 % TD OINT
1.0000 [in_us] | TOPICAL_OINTMENT | Freq: Once | TRANSDERMAL | Status: AC
  Administered 2018-03-27: 1 [in_us] via TOPICAL
  Filled 2018-03-27: qty 1

## 2018-03-27 NOTE — Plan of Care (Signed)
  Problem: Education: Goal: Knowledge of General Education information will improve Outcome: Progressing   Problem: Clinical Measurements: Goal: Ability to maintain clinical measurements within normal limits will improve Outcome: Progressing   Problem: Nutrition: Goal: Adequate nutrition will be maintained Outcome: Progressing

## 2018-03-27 NOTE — Progress Notes (Signed)
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  -this is most likely the reason behind her symptoms, as my triggered diastolic HF.  -after discussing with cardiology, recommendations given to transfer patient to [facility] for evaluation by CVTS for potential surgery. -VSS and currently comfortable and without chest discomfort.  Barton Dubois MD [phone]

## 2018-03-27 NOTE — Progress Notes (Signed)
  Echocardiogram 2D Echocardiogram has been performed.  Jennette Dubin 03/27/2018, 2:22 PM

## 2018-03-27 NOTE — ED Provider Notes (Signed)
[facility] EMERGENCY DEPARTMENT Provider Note   CSN: [id number] Arrival date & time: 03/27/18  0703     History   Chief Complaint Chief Complaint  Patient presents with  . Extremity Weakness    HPI Jacqueline [name] is a 67 y.o. female.  HPI  67 year old female presents with a chief complaint of near syncope  The patient is a longtime smoker, still smokes half a pack of cigarettes a day, also endorses a history of peripheral neuropathy and chronic pain for which she is on Suboxone.  Reportedly she does endorse a history of prior substance abuse in the medical record review shows that she has been known to use cocaine opiates and marijuana.  She denies any recent alcohol use but does have a distant history of pancreatitis.  The patient reports that over the last several months she has had some progressive symptoms on exertion feeling as though her legs become weak, she becomes short of breath, very fatigued, diaphoretic and then feels like she is going to pass out.  Initially this was only with significant exertion but recently this has happened with is much is trying to clean the house and yesterday became extremely symptomatic with the above symptoms.  This usually resolves when she goes to lay down and yesterday this did resolve after 1 hour of laying down.  This morning she states that she still feels some residual fatigue in her legs but has no symptoms in her chest.  She does report that she has some chest heaviness when she gets short of breath with her exertional activities.  She denies prior cardiac disease, she thinks that she may have had a stress test many years ago but does not recall when or what the results were.  She does not take aspirin, she does have a history of significant acid reflux and a prior gastric tumor which was resected for which she was told it was noncancerous.  Past Medical History:  Diagnosis Date  . Anxiety   . Chronic back pain   . Drug-seeking behavior     . Gastric nodule 2009   EUS, ?leiomyoma  . 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 GERD (gastroesophageal reflux disease)   . Gout   . History of pancreatitis    Biliary and/or etoh?  . Irritable bowel syndrome   . Knee pain   . Migraines   . Migraines   . Peripheral neuropathy   . Polysubstance abuse (HCC)    opiates, cocaine, marijuana    Patient Active Problem List   Diagnosis Date Noted  . SOB (shortness of breath) on exertion 03/27/2018  . Constipation 10/06/2017  . GERD (gastroesophageal reflux disease) 09/26/2016  . PUD (peptic ulcer disease) 10/01/2015  . Nausea with vomiting 10/01/2015  . Loss of weight 10/01/2015  . Dizzy 07/16/2015  . Major depressive disorder, recurrent, severe without psychotic features ([place])   . Substance induced mood disorder ([place]) 04/24/2015  . Opioid type dependence, continuous ([place]) 04/24/2015  . Severe major depression without psychotic features ([place]) 04/24/2015  . PTSD (post-traumatic stress disorder) 04/24/2015  . Chest pain, localized 04/28/2012  . Odynophagia 09/30/2011  . Colon cancer screening 09/30/2011  . Gastric tumor 09/30/2011  . CLOSED FRACTURE OF UPPER END OF FIBULA 07/31/2010  . Dysphagia, idiopathic 07/17/2009  . ABDOMINAL PAIN, GENERALIZED 07/17/2009  . GASTRITIS 07/12/2009  . IBS 07/12/2009  . RUQ PAIN  07/12/2009  . EPIGASTRIC PAIN 07/12/2009  . PANCREATITIS, ACUTE, HX OF 07/12/2009  . Osteoarthrosis, unspecified whether generalized or localized, hand 11/09/2008  . OSTEOARTHRITIS, LOWER LEG 10/25/2008  . DERANGEMENT MENISCUS 10/16/2008  . KNEE PAIN 10/16/2008    Past Surgical History:  Procedure Laterality Date  . ABDOMINAL HYSTERECTOMY    . ABDOMINAL SURGERY    . BACK SURGERY    . back surgery /ray cage fusion comberg, gso    . BIOPSY  07/31/2015   Procedure: BIOPSY (DUODENAL, GASTRIC, GASTRIC ULCER, ESOPHAGEAL);   Surgeon: Danie Binder, MD;  Location: [facility];  Service: Endoscopy;;  . CESAREAN SECTION    . CHOLECYSTECTOMY    . COLONOSCOPY  2001   Dr. Laural Golden, hemorrhoids  . COLONOSCOPY  10/21/2011   EHU:DJSHFWY polyp multiple/internal hemorrhoids  . DILATION AND CURETTAGE OF UTERUS    . ESOPHAGEAL DILATION N/A 07/31/2015   Procedure: ESOPHAGEAL DILATION WIRE GUIDED WITH SAVORY DILATORS 15MM, 16MM;  Surgeon: Danie Binder, MD;  Location: [facility];  Service: Endoscopy;  Laterality: N/A;  . ESOPHAGOGASTRODUODENOSCOPY  11/2008   A 9-mm submucosal lesion seen in the cardia., gastritis, no hpylori  . ESOPHAGOGASTRODUODENOSCOPY (EGD) WITH PROPOFOL N/A 07/31/2015   OVZ:CHYIFOY ulcer and moderate gastritis, dysphagia, empirical dilation with no identified source. esophageal, gastric, duodenal bx unremarkable.   . ESOPHAGOGASTRODUODENOSCOPY (EGD) WITH PROPOFOL N/A 11/13/2015   SLF: 1. Gastric ulcer has healed. 2. single non-bleeding gastric AVMs 3. mild non-erosive gastritis.   Marland Kitchen ESOPHAGOGASTRODUODENOSCOPY (EGD) WITH PROPOFOL N/A 10/20/2017   Procedure: ESOPHAGOGASTRODUODENOSCOPY (EGD) WITH PROPOFOL;  Surgeon: Danie Binder, MD;  Location: [facility] SUITE;  Service: Endoscopy;  Laterality: N/A;  2:00pm  . FOOT SURGERY    . GALLBLADDER SURGERY  2010  . 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  Marland Kitchen SAVORY DILATION  02/03/2012   DXA:JOINOMVEH in the distal esophagus/Mild gastritis  . toe graft    . TONSILLECTOMY       OB History    Gravida  3   Para  2   Term  2   Preterm      AB  1   Living  2     SAB  1   TAB      Ectopic      Multiple      Live Births               Home Medications    Prior to Admission medications   Medication Sig Start Date End Date Taking? Authorizing Provider  acetaminophen (TYLENOL) 500 MG tablet Take  500-1,000 mg by mouth every 6 (six) hours as needed (for fever.).   Yes [provider]  cetirizine (ZYRTEC) 10 MG tablet Take 10 mg by mouth daily.   Yes [provider]  cyclobenzaprine (FLEXERIL) 10 MG tablet Take 10 mg by mouth 3 (three) times daily.  09/22/17  Yes [provider]  diclofenac sodium (VOLTAREN) 1 % GEL Apply 1 g topically 4 (four) times daily as needed (for arthritis pain.).   Yes [provider]  diphenhydrAMINE (BENADRYL) 25 mg capsule Take 25 mg by mouth every 6 (six) hours as needed (for allergies.).   Yes [provider]  FLUoxetine (PROZAC) 40 MG capsule Take 40 mg by mouth daily.  09/22/17  Yes [provider]  gabapentin (  NEURONTIN) 300 MG capsule Take 2 capsules (600 mg total) by mouth 3 (three) times daily. 04/30/15  Yes Niel Hummer, NP  ibuprofen (ADVIL,MOTRIN) 200 MG tablet Take 800 mg by mouth every 6 (six) hours as needed.   Yes [provider]  omeprazole (PRILOSEC) 20 MG capsule Take 20 mg by mouth daily.   Yes [provider]  ondansetron (ZOFRAN) 4 MG tablet TAKE 1 TABLET(4 MG) BY MOUTH EVERY 8 HOURS AS NEEDED FOR NAUSEA OR VOMITING 11/11/17  Yes Annitta Needs, NP  SUBOXONE 8-2 MG FILM Take 1 Film by mouth 3 (three) times daily.  10/02/17  Yes [provider]  dexlansoprazole (DEXILANT) 60 MG capsule Take 1 capsule (60 mg total) daily before breakfast by mouth. Patient not taking: Reported on 03/27/2018 10/26/17   Carlis Stable, NP    Family History Family History  Problem Relation Age of Onset  . Colon cancer Other   . Colon cancer Mother        diagnosed in late 44s and died age 32  . Heart failure Mother   . Heart defect Unknown        family history   . Arthritis Unknown        family history  . COPD Unknown        family history   . Cancer Unknown        multiple unknown type  . Heart attack Father        deceased at 64  . Hypertension Father   . Heart failure Father    . Lung cancer Maternal Uncle   . Throat cancer Maternal Uncle   . Colon cancer Paternal Uncle   . Colon cancer Maternal Aunt   . Anesthesia problems Neg Hx   . Hypotension Neg Hx   . Malignant hyperthermia Neg Hx   . Pseudochol deficiency Neg Hx     Social History Social History   Tobacco Use  . Smoking status: Current Every Day Smoker    Packs/day: 0.50    Years: 35.00    Pack years: 17.50    Types: Cigarettes  . Smokeless tobacco: Never Used  Substance Use Topics  . Alcohol use: Yes    Comment: occasional holiday drink... pt denied alcohol use on admission to [facility]  . Drug use: Yes    Frequency: 1.0 times per week    Types: Marijuana    Comment: +cocaine on 04/2015 UDS     Allergies   Aspirin; Imitrex [sumatriptan base]; Ketorolac tromethamine; Nsaids; Promethazine hcl; and Sulfonamide derivatives   Review of Systems Review of Systems  All other systems reviewed and are negative.    Physical Exam Updated Vital Signs BP (!) 160/91   Pulse 66   Resp 12   Ht 5\' 2"  (1.575 m)   Wt 56.7 kg (125 lb)   SpO2 97%   BMI 22.86 kg/m   Physical Exam  Constitutional: She appears well-developed and well-nourished. No distress.  HENT:  Head: Normocephalic and atraumatic.  Mouth/Throat: Oropharynx is clear and moist. No oropharyngeal exudate.  Eyes: Pupils are equal, round, and reactive to light. Conjunctivae and EOM are normal. Right eye exhibits no discharge. Left eye exhibits no discharge. No scleral icterus.  Neck: Normal range of motion. Neck supple. No JVD present. No thyromegaly present.  Cardiovascular: Normal rate, regular rhythm, normal heart sounds and intact distal pulses. Exam reveals no gallop and no friction rub.  No murmur heard. Pulmonary/Chest: Effort normal. No  respiratory distress. She has no wheezes. She has rales ( subtle rales at bases).  Abdominal: Soft. Bowel sounds are normal. She exhibits no distension and no mass. There is no tenderness.    Musculoskeletal: Normal range of motion. She exhibits no edema or tenderness.  Lymphadenopathy:    She has no cervical adenopathy.  Neurological: She is alert. Coordination normal.  Skin: Skin is warm and dry. No rash noted. No erythema.  Psychiatric: She has a normal mood and affect. Her behavior is normal.  Nursing note and vitals reviewed.    ED Treatments / Results  Labs (all labs ordered are listed, but only abnormal results are displayed) Labs Reviewed  COMPREHENSIVE METABOLIC PANEL - Abnormal; Notable for the following components:      Result Value   Potassium 3.4 (*)    Chloride 98 (*)    Calcium 8.8 (*)    ALT 9 (*)    All other components within normal limits  CBC - Abnormal; Notable for the following components:   RBC 3.66 (*)    Hemoglobin 10.9 (*)    HCT 34.6 (*)    All other components within normal limits  BRAIN NATRIURETIC PEPTIDE - Abnormal; Notable for the following components:   B Natriuretic Peptide 400.0 (*)    All other components within normal limits  CBG MONITORING, ED - Abnormal; Notable for the following components:   Glucose-Capillary 100 (*)    All other components within normal limits  PROTIME-INR  TROPONIN I  RAPID URINE DRUG SCREEN, HOSP PERFORMED  HEMOGLOBIN A1C  TSH    EKG EKG Interpretation  Date/Time:  Saturday March 27 2018 07:11:59 EDT Ventricular Rate:  80 PR Interval:    QRS Duration: 101 QT Interval:  403 QTC Calculation: 465 R Axis:   70 Text Interpretation:  Sinus rhythm Probable left atrial enlargement Abnormal R-wave progression, early transition Borderline T abnormalities, anterior leads Baseline wander in lead(s) II III aVR aVL aVF V3 V4 V5 V6 Since last tracing in 10/18, there is no significant changes Confirmed by Noemi Chapel [phone]) on 03/27/2018 7:24:54 AM   Radiology Dg Chest 2 View  Result Date: 03/27/2018 CLINICAL DATA:  67 year old presenting with intermittent generalized weakness, particularly the lower  extremities, with associated presyncope. EXAM: CHEST - 2 VIEW COMPARISON:  12/29/2013, 06/15/2013. FINDINGS: Cardiac silhouette moderately enlarged, increased in size since 2015. Thoracic aorta mildly atherosclerotic, unchanged. Hilar and mediastinal contours otherwise unremarkable. Mild to moderate diffuse interstitial pulmonary edema. No confluent airspace consolidation. Small BILATERAL pleural effusions best visualized in the POSTERIOR costophrenic sulci on the LATERAL view. Degenerative changes involving the thoracic and upper lumbar spine. IMPRESSION: 1. Mild CHF, with moderate cardiomegaly and mild diffuse interstitial pulmonary edema. 2. Small BILATERAL pleural effusions. Electronically Signed   By: Evangeline Dakin M.D.   On: 03/27/2018 08:21    Procedures Procedures (including critical care time)  Medications Ordered in ED Medications  enoxaparin (LOVENOX) injection 40 mg (has no administration in time range)  nitroGLYCERIN (NITROGLYN) 2 % ointment 1 inch (1 inch Topical Given 03/27/18 0856)     Initial Impression / Assessment and Plan / ED Course  I have reviewed the triage vital signs and the nursing notes.  Pertinent labs & imaging results that were available during my care of the patient were reviewed by me and considered in my medical decision making (see chart for details).  Clinical Course as of Mar 27 957  Sat Mar 27, 2018  1062 Labs reveal mild anemia  with a hemoglobin of 10.9, mild hypokalemia with a potassium of 3.4 and a normal troponin.   [BM]  0836 Hemoglobin(!): 10.9 [BM]  0836 Troponin I: <0.03 [BM]  0836 Potassium(!): 3.4 [BM]  0838 I have personally viewed the 2 view PA and lateral Chest x-ray reveals which reveals diffuse bilateral interstitial edema, cardiomegaly but no obvious infiltrates.  Blood pressure remains elevated.  We will give the patient some topical nitroglycerin and admit for further evaluation.  Increased concerned that her symptoms could be related  to either congestive heart failure or an ischemic cause.  Drug screen added due to patient's underlying history of cocaine abuse   [BM]    Clinical Course User Index [BM] Noemi Chapel, MD    The patient's symptoms are concerning for exertional angina, her EKG is normal there is no ST elevations depressions or abnormal findings to suggest acute ischemia but the patient is also mostly asymptomatic at this time.  This does raise the concern for obstructive disease.  She has had worsening symptoms which would be classified as progressive angina or even unstable angina.  Will obtain labs, troponin, cardiac monitoring and a chest x-ray.  Will need to discuss with cardiology to plan further testing and whether this needs to be done inpatient or outpatient.  Discussed with Dr. Stanford Breed with cardiology who agrees that the patient can have the workup here with echocardiogram and further evaluation  Discussed with hospitalist Dr. Dyann Kief who has kindly agreed to admit for further w/u.  Final Clinical Impressions(s) / ED Diagnoses   Final diagnoses:  Congestive heart failure, unspecified HF chronicity, unspecified heart failure type ([place])  Unstable angina (HCC)      Noemi Chapel, MD 03/27/18 [phone]

## 2018-03-27 NOTE — ED Notes (Signed)
Attempted to call report -  Nurse unavailable.  Will call back in five minutes.

## 2018-03-27 NOTE — Progress Notes (Signed)
Report called to Coralyn Mark, 4E, & [facility] has been notified.

## 2018-03-27 NOTE — Progress Notes (Signed)
Received Jacqueline [name] to room [address] [place] room 1 from [place].  Patient is awake, alert and oriented x4.  VSS.  Skin check completed.  IV NS at 50 cc hr in right ac, CDI.  Placed on cardiac monitor and contacted to [place] to room and department, verbalized understanding.

## 2018-03-27 NOTE — ED Notes (Signed)
CBG 100 

## 2018-03-27 NOTE — ED Triage Notes (Addendum)
Pt reports bilateral lower extremity "heaviness" since last summer. Pt reports last episode was on Thursday and then again on Saturday. Pt reports "I shake all over and feel like I'm fixing to pass out." pt reports seen for same at PCP office. EDP at bedside assessing pt.

## 2018-03-27 NOTE — H&P (Addendum)
History and Physical    Jacqueline [name] ZLD:[id number] DOB: [birth year] DOA: 03/27/2018  PCP: Neale Burly, MD   I have briefly reviewed patients previous medical reports in [facility].  Patient coming from: Home  Chief Complaint: Near Syncope, SOB on exertion and chest heaviness   HPI: Jacqueline [name] is a 67 year old female with a 17.5 pack years cigarette-smoking history, who still smokes 1/2 pack daily, peripheral neuropathy, chronic constipation (last BM was on 4/2), GERD and chronic pain for which she is on Suboxone. She describes taking Ibuprofen every day for the past three weeks for help with right shoulder pain. She reports a history of illicit drug abuse, which she described as taking "everything". Her medical record review shows that she has been known to use cocaine, opiates and marijuana. The patient states she still smokes marijuana regularly, but does not use any other illicit drugs at this moment (clean for over 2 years). She denies any recent alcohol use, but does have a distant history of alcoholic pancreatitis. The patient reports that for the past several months she has had some symptoms of shortness of breath, dizziness, weakness, fatigue, and feelings of almost passing out. The symptoms have been increasing during the past several weeks, to were yesterday she describes trying to clean her house with a friend, and could not do it. The patient describes that she could not get up from a chair to do any type of movement because she felt short of breath, weak, nauseous, and about to pass out. She describes that when past similar episodes have occurred, they have been relieved by rest. This last episode, she describes as having some chest heaviness on exertion and associated diaphoresis.  She denies any prior cardiac disease, vomiting, hematemesis, melena, hematochezia, hematuria, dysuria, focal weakness or any other complaints.    ED Course: the patient arrived around 7 am  with the above symptoms and had an elevated BP at 160/91. The patient's blood work was noticeable for an elevated BNP at 400 and negative troponin I. A positive UDS for tetrahydrocannabinol. Her CXR showed a moderately enlarged cardiac silhouette, mild CHF with mild diffuse interstitial edema, and small bilateral pleural effusions. EKG without acute ischemic changes. A nitroglycerin patch was applied. TRH called to bring the patient into observation for further evaluation and management.  Review of Systems:  All other systems reviewed and apart from HPI, are negative.  Past Medical History:  Diagnosis Date  . Anxiety   . Chronic back pain   . Drug-seeking behavior   . Gastric nodule 2009   EUS, ?leiomyoma  . 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 GERD (gastroesophageal reflux disease)   . Gout   . History of pancreatitis    Biliary and/or etoh?  . Irritable bowel syndrome   . Knee pain   . Migraines   . Migraines   . Peripheral neuropathy   . Polysubstance abuse ([place])    opiates, cocaine, marijuana    Past Surgical History:  Procedure Laterality Date  . ABDOMINAL HYSTERECTOMY    . ABDOMINAL SURGERY    . BACK SURGERY    . back surgery /ray cage fusion comberg, gso    . BIOPSY  07/31/2015   Procedure: BIOPSY (DUODENAL, GASTRIC, GASTRIC ULCER, ESOPHAGEAL);  Surgeon: Danie Binder, MD;  Location: [facility];  Service: Endoscopy;;  . CESAREAN SECTION    .  CHOLECYSTECTOMY    . COLONOSCOPY  2001   Dr. Laural Golden, hemorrhoids  . COLONOSCOPY  10/21/2011   LZJ:QBHALPF polyp multiple/internal hemorrhoids  . DILATION AND CURETTAGE OF UTERUS    . ESOPHAGEAL DILATION N/A 07/31/2015   Procedure: ESOPHAGEAL DILATION WIRE GUIDED WITH SAVORY DILATORS 15MM, 16MM;  Surgeon: Danie Binder, MD;  Location: [facility];  Service: Endoscopy;  Laterality: N/A;  . ESOPHAGOGASTRODUODENOSCOPY  11/2008   A 9-mm submucosal  lesion seen in the cardia., gastritis, no hpylori  . ESOPHAGOGASTRODUODENOSCOPY (EGD) WITH PROPOFOL N/A 07/31/2015   XTK:WIOXBDZ ulcer and moderate gastritis, dysphagia, empirical dilation with no identified source. esophageal, gastric, duodenal bx unremarkable.   . ESOPHAGOGASTRODUODENOSCOPY (EGD) WITH PROPOFOL N/A 11/13/2015   SLF: 1. Gastric ulcer has healed. 2. single non-bleeding gastric AVMs 3. mild non-erosive gastritis.   Marland Kitchen ESOPHAGOGASTRODUODENOSCOPY (EGD) WITH PROPOFOL N/A 10/20/2017   Procedure: ESOPHAGOGASTRODUODENOSCOPY (EGD) WITH PROPOFOL;  Surgeon: Danie Binder, MD;  Location: [facility];  Service: Endoscopy;  Laterality: N/A;  2:00pm  . FOOT SURGERY    . GALLBLADDER SURGERY  2010  . 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  Marland Kitchen SAVORY DILATION  02/03/2012   HGD:JMEQASTMH in the distal esophagus/Mild gastritis  . toe graft    . TONSILLECTOMY      Social History  reports that she has been smoking cigarettes.  She has a 17.50 pack-year smoking history. She has never used smokeless tobacco. She reports that she drinks alcohol. She reports that she has current or past drug history. Drug: Marijuana. Frequency: 1.00 time per week.  Allergies  Allergen Reactions  . Aspirin Other (See Comments)    Has had part of stomach removed, cannot take aspirin   . Imitrex [Sumatriptan Base] Other (See Comments)    Fast heart rate  . Ketorolac Tromethamine Other (See Comments)    "makes my hands draw up"  . Nsaids Other (See Comments)    Due to stomach issues (has had part of stomach removed, cannot take NSAIDS OR ASA)  . Promethazine Hcl Other (See Comments)    Legs jerking -" like restless legs"  . Sulfonamide Derivatives Nausea And Vomiting    Lost consciousness     Family History  Problem Relation Age of Onset  . Colon  cancer Other   . Colon cancer Mother        diagnosed in late 43s and died age 24  . Heart failure Mother   . Heart defect Unknown        family history   . Arthritis Unknown        family history  . COPD Unknown        family history   . Cancer Unknown        multiple unknown type  . Heart attack Father        deceased at 52  . Hypertension Father   . Heart failure Father   . Lung cancer Maternal Uncle   . Throat cancer Maternal Uncle   . Colon cancer Paternal Uncle   . Colon cancer Maternal Aunt   . Anesthesia problems Neg Hx   . Hypotension Neg Hx   . Malignant hyperthermia Neg Hx   . Pseudochol deficiency Neg Hx     Prior to Admission medications   Medication Sig Start Date End Date  Taking? Authorizing Provider  acetaminophen (TYLENOL) 500 MG tablet Take 500-1,000 mg by mouth every 6 (six) hours as needed (for fever.).   Yes [provider]  cetirizine (ZYRTEC) 10 MG tablet Take 10 mg by mouth daily.   Yes [provider]  cyclobenzaprine (FLEXERIL) 10 MG tablet Take 10 mg by mouth 3 (three) times daily.  09/22/17  Yes [provider]  diclofenac sodium (VOLTAREN) 1 % GEL Apply 1 g topically 4 (four) times daily as needed (for arthritis pain.).   Yes [provider]  diphenhydrAMINE (BENADRYL) 25 mg capsule Take 25 mg by mouth every 6 (six) hours as needed (for allergies.).   Yes [provider]  FLUoxetine (PROZAC) 40 MG capsule Take 40 mg by mouth daily.  09/22/17  Yes [provider]  gabapentin (NEURONTIN) 300 MG capsule Take 2 capsules (600 mg total) by mouth 3 (three) times daily. 04/30/15  Yes Niel Hummer, NP  ibuprofen (ADVIL,MOTRIN) 200 MG tablet Take 800 mg by mouth every 6 (six) hours as needed.   Yes [provider]  omeprazole (PRILOSEC) 20 MG capsule Take 20 mg by mouth daily.   Yes [provider]  ondansetron (ZOFRAN) 4 MG tablet TAKE 1 TABLET(4 MG) BY MOUTH EVERY 8 HOURS AS NEEDED FOR  NAUSEA OR VOMITING 11/11/17  Yes Annitta Needs, NP  SUBOXONE 8-2 MG FILM Take 1 Film by mouth 3 (three) times daily.  10/02/17  Yes [provider]  dexlansoprazole (DEXILANT) 60 MG capsule Take 1 capsule (60 mg total) daily before breakfast by mouth. Patient not taking: Reported on 03/27/2018 10/26/17   Carlis Stable, NP    Physical Exam: Vitals:   03/27/18 0800 03/27/18 0900 03/27/18 0915 03/27/18 0930  BP: (!) 149/85 (!) 160/91  (!) 164/101  Pulse: 66 64 66 62  Resp: 16  12 15   SpO2: 91% 98% 97% 100%  Weight:      Height:        Constitutional: Afebrile. Alert, awake, oriented x 3. Patient appears to be in no acute distress. Patient was lying in bed answering questions appropriately.  Eyes: Anicteric. PERTLA. EOMI. No discharge from either eye.  ENMT: Oral mucosa is moist. No pharyngeal erythema, lesions or exudates. Normal dentition.  Neck: Supple, no masses, no thyromegaly.  Respiratory: Clear to auscultation bilaterally. No wheezing, rhonchi or crackles. No use of accessory muscles.   Cardiovascular: Regular rate and rhythm. S1 and S2 heard. No murmurs, gallops or rubs. No extremity edema. 2+ pedal pulses. No carotid bruits, no JVD.  Abdomen: Soft, non distended, non tender to palpation. No organomegaly or masses felt. Positive BS x 4 quadrants.   Musculoskeletal: No edema, cyanosis or clubbing. No joint deformity at upper or lower extremities. Good range of motion. Normal muscle tone.  Skin: No rashes, lesions or ulcers. No induration. Neurologic: CN 2-12 grossly intact. Sensation intact, DTR normal. Strength 4/5 in all 4 limbs due to chronic shoulder pain and poor effort. Psychiatric: Mood and affect appropriate. Normal judgement and insight.   Labs on Admission: I have personally reviewed following labs and imaging studies  CBC: Recent Labs  Lab 03/27/18 0742  WBC 5.1  HGB 10.9*  HCT 34.6*  MCV 94.5  PLT 324   Basic Metabolic Panel: Recent Labs  Lab  03/27/18 0742  NA 137  K 3.4*  CL 98*  CO2 29  GLUCOSE 93  BUN 8  CREATININE 0.81  CALCIUM 8.8*   Liver Function Tests:  Recent Labs  Lab 03/27/18 0742  AST 17  ALT 9*  ALKPHOS 74  BILITOT 0.4  PROT 6.9  ALBUMIN 3.8   Coagulation Profile: Recent Labs  Lab 03/27/18 0742  INR 1.02   Cardiac Enzymes: Recent Labs  Lab 03/27/18 0742  TROPONINI <0.03   CBG: Recent Labs  Lab 03/27/18 0732  GLUCAP 100*   Urine analysis:    Component Value Date/Time   COLORURINE YELLOW 05/02/2012 1919   APPEARANCEUR CLEAR 05/02/2012 1919   LABSPEC 1.010 05/02/2012 1919   PHURINE 7.5 05/02/2012 1919   GLUCOSEU NEGATIVE 05/02/2012 1919   HGBUR TRACE (A) 05/02/2012 1919   BILIRUBINUR NEGATIVE 05/02/2012 1919   KETONESUR NEGATIVE 05/02/2012 1919   PROTEINUR NEGATIVE 05/02/2012 1919   UROBILINOGEN 0.2 05/02/2012 1919   NITRITE NEGATIVE 05/02/2012 1919   LEUKOCYTESUR NEGATIVE 05/02/2012 1919    Radiological Exams on Admission: Dg Chest 2 View  Result Date: 03/27/2018 CLINICAL DATA:  67 year old presenting with intermittent generalized weakness, particularly the lower extremities, with associated presyncope. EXAM: CHEST - 2 VIEW COMPARISON:  12/29/2013, 06/15/2013. FINDINGS: Cardiac silhouette moderately enlarged, increased in size since 2015. Thoracic aorta mildly atherosclerotic, unchanged. Hilar and mediastinal contours otherwise unremarkable. Mild to moderate diffuse interstitial pulmonary edema. No confluent airspace consolidation. Small BILATERAL pleural effusions best visualized in the POSTERIOR costophrenic sulci on the LATERAL view. Degenerative changes involving the thoracic and upper lumbar spine. IMPRESSION: 1. Mild CHF, with moderate cardiomegaly and mild diffuse interstitial pulmonary edema. 2. Small BILATERAL pleural effusions. Electronically Signed   By: Evangeline Dakin M.D.   On: 03/27/2018 08:21    EKG: Independently reviewed. Without acute ischemic changes. Normal  axis  Assessment/Plan Active Problems:   SOB (shortness of breath) on exertion Shortness of breath on exertion and chest heaviness.  -With concern for angina equivalent; patient with a heart score of 4 -Will bring for observation to a telemetry bed -Cycle troponin and EKG -Check 2D Echo, especially with elevated BNP (at 400) -Will discontinue nitroglycerin patch; Start coreg, aspirin 81mg , lisinopril, and PRN morphine and SL nitroglycerine  -Will check Hgb A1c and lipid panel as part of stratification  Hypokalemia -Will check Mg -Replete potassium -Follow electrolyte trend  History of polysubstance abuse -Patient has been clean of cocaine for over 2 years -Actively smoking marijuana intermittently -UDS positive for marijuana (which corroborates with her history)  Mild hyperglycemia -will check Hg A1c as part of chest discomfort stratification (see above)  -No prior history of diabetes -not requiring coverage or treatment at this time -will repeat fasting CBG in am  Tobacco abuse -Smoking cessation counseling provided -Will use nicotine patch  Constipation: -Most likely due to chronic use of Suboxone -Will use colace and PRN miralax -Will check TSH  Depression  -no SI or hallucinations  -continue Prozac  DVT prophylaxis: Lovenox  Code Status: Full code Family Communication: none at bedside  Disposition Plan: Anticipate discharge back home, most likely 03/28/18 once workup for ACS has been completed.  Consults called: None Admission status: Observation, LOS less than two nights, telemetry bed   Barton Dubois MD [facility] Pager [phone]  If 7PM-7AM, please contact night-coverage [email or web address] Password [facility]  03/27/2018, 10:57 AM

## 2018-03-28 DIAGNOSIS — Z800 Family history of malignant neoplasm of digestive organs: Secondary | ICD-10-CM | POA: Diagnosis not present

## 2018-03-28 DIAGNOSIS — F332 Major depressive disorder, recurrent severe without psychotic features: Secondary | ICD-10-CM | POA: Diagnosis present

## 2018-03-28 DIAGNOSIS — R7989 Other specified abnormal findings of blood chemistry: Secondary | ICD-10-CM | POA: Diagnosis not present

## 2018-03-28 DIAGNOSIS — K219 Gastro-esophageal reflux disease without esophagitis: Secondary | ICD-10-CM

## 2018-03-28 DIAGNOSIS — K279 Peptic ulcer, site unspecified, unspecified as acute or chronic, without hemorrhage or perforation: Secondary | ICD-10-CM | POA: Diagnosis not present

## 2018-03-28 DIAGNOSIS — Z8249 Family history of ischemic heart disease and other diseases of the circulatory system: Secondary | ICD-10-CM | POA: Diagnosis not present

## 2018-03-28 DIAGNOSIS — M1710 Unilateral primary osteoarthritis, unspecified knee: Secondary | ICD-10-CM | POA: Diagnosis not present

## 2018-03-28 DIAGNOSIS — I5032 Chronic diastolic (congestive) heart failure: Secondary | ICD-10-CM | POA: Diagnosis present

## 2018-03-28 DIAGNOSIS — J449 Chronic obstructive pulmonary disease, unspecified: Secondary | ICD-10-CM | POA: Diagnosis not present

## 2018-03-28 DIAGNOSIS — F17210 Nicotine dependence, cigarettes, uncomplicated: Secondary | ICD-10-CM | POA: Diagnosis present

## 2018-03-28 DIAGNOSIS — Z888 Allergy status to other drugs, medicaments and biological substances status: Secondary | ICD-10-CM | POA: Diagnosis not present

## 2018-03-28 DIAGNOSIS — K5900 Constipation, unspecified: Secondary | ICD-10-CM | POA: Diagnosis not present

## 2018-03-28 DIAGNOSIS — G629 Polyneuropathy, unspecified: Secondary | ICD-10-CM | POA: Diagnosis present

## 2018-03-28 DIAGNOSIS — Z882 Allergy status to sulfonamides status: Secondary | ICD-10-CM | POA: Diagnosis not present

## 2018-03-28 DIAGNOSIS — R0609 Other forms of dyspnea: Secondary | ICD-10-CM | POA: Diagnosis present

## 2018-03-28 DIAGNOSIS — R0602 Shortness of breath: Secondary | ICD-10-CM | POA: Diagnosis present

## 2018-03-28 DIAGNOSIS — Z90710 Acquired absence of both cervix and uterus: Secondary | ICD-10-CM | POA: Diagnosis not present

## 2018-03-28 DIAGNOSIS — F4310 Post-traumatic stress disorder, unspecified: Secondary | ICD-10-CM | POA: Diagnosis present

## 2018-03-28 DIAGNOSIS — F419 Anxiety disorder, unspecified: Secondary | ICD-10-CM | POA: Diagnosis present

## 2018-03-28 DIAGNOSIS — D151 Benign neoplasm of heart: Principal | ICD-10-CM

## 2018-03-28 DIAGNOSIS — Z886 Allergy status to analgesic agent status: Secondary | ICD-10-CM | POA: Diagnosis not present

## 2018-03-28 DIAGNOSIS — K589 Irritable bowel syndrome without diarrhea: Secondary | ICD-10-CM | POA: Diagnosis not present

## 2018-03-28 DIAGNOSIS — D62 Acute posthemorrhagic anemia: Secondary | ICD-10-CM | POA: Diagnosis not present

## 2018-03-28 DIAGNOSIS — I340 Nonrheumatic mitral (valve) insufficiency: Secondary | ICD-10-CM | POA: Diagnosis present

## 2018-03-28 DIAGNOSIS — I25110 Atherosclerotic heart disease of native coronary artery with unstable angina pectoris: Secondary | ICD-10-CM | POA: Diagnosis present

## 2018-03-28 DIAGNOSIS — Z808 Family history of malignant neoplasm of other organs or systems: Secondary | ICD-10-CM | POA: Diagnosis not present

## 2018-03-28 DIAGNOSIS — D213 Benign neoplasm of connective and other soft tissue of thorax: Secondary | ICD-10-CM

## 2018-03-28 DIAGNOSIS — Z801 Family history of malignant neoplasm of trachea, bronchus and lung: Secondary | ICD-10-CM | POA: Diagnosis not present

## 2018-03-28 DIAGNOSIS — I519 Heart disease, unspecified: Secondary | ICD-10-CM | POA: Diagnosis not present

## 2018-03-28 DIAGNOSIS — E876 Hypokalemia: Secondary | ICD-10-CM | POA: Diagnosis not present

## 2018-03-28 DIAGNOSIS — I10 Essential (primary) hypertension: Secondary | ICD-10-CM | POA: Diagnosis not present

## 2018-03-28 DIAGNOSIS — Z720 Tobacco use: Secondary | ICD-10-CM | POA: Diagnosis not present

## 2018-03-28 DIAGNOSIS — G894 Chronic pain syndrome: Secondary | ICD-10-CM | POA: Diagnosis present

## 2018-03-28 DIAGNOSIS — R0789 Other chest pain: Secondary | ICD-10-CM | POA: Diagnosis not present

## 2018-03-28 DIAGNOSIS — Z9049 Acquired absence of other specified parts of digestive tract: Secondary | ICD-10-CM | POA: Diagnosis not present

## 2018-03-28 DIAGNOSIS — G43909 Migraine, unspecified, not intractable, without status migrainosus: Secondary | ICD-10-CM | POA: Diagnosis present

## 2018-03-28 DIAGNOSIS — M109 Gout, unspecified: Secondary | ICD-10-CM | POA: Diagnosis present

## 2018-03-28 DIAGNOSIS — Z86018 Personal history of other benign neoplasm: Secondary | ICD-10-CM

## 2018-03-28 LAB — HIV ANTIBODY (ROUTINE TESTING W REFLEX): HIV SCREEN 4TH GENERATION: NONREACTIVE

## 2018-03-28 MED ORDER — POTASSIUM CHLORIDE CRYS ER 20 MEQ PO TBCR
40.0000 meq | EXTENDED_RELEASE_TABLET | Freq: Once | ORAL | Status: AC
  Administered 2018-03-28: 40 meq via ORAL
  Filled 2018-03-28: qty 2

## 2018-03-28 NOTE — Consult Note (Signed)
Reason for Consult:Left atrial myxoma Referring Physician: Dr. Wynelle Cleveland [facility]  Jacqueline [name] is an 67 y.o. female.  HPI: 67 yo woman with no prior cardiac history presents with a cc/o SOB  Ms. Jacqueline [name] is a 67 yo woman with a history of tobacco abuse, anxiety, migraines, chronic back pain, IBS, GERD and a remote history of a gastric tumor. She presents with several month history of shortness of breath and fatigue. First noticed it last summer. Has progressively gotten worse. She has been having problems with dizziness recently. No LOC. The day prior to admission she developed severe fatigue and weakness while doing light house work. Sat down and then could not get up. Also some sensation of heaviness in chest.  She came to the ED and was noted to be in CHF. EKG had no ischemic changes. She had an echo which showed a large left atrial myxoma. She currently is symptom free sitting in bed.   Past Medical History:  Diagnosis Date  . Anxiety   . Chronic back pain   . Drug-seeking behavior   . Gastric nodule 2009   EUS, ?leiomyoma  . 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 GERD (gastroesophageal reflux disease)   . Gout   . History of pancreatitis    Biliary and/or etoh?  . Irritable bowel syndrome   . Knee pain   . Migraines   . Migraines   . Peripheral neuropathy   . Polysubstance abuse ([place])    opiates, cocaine, marijuana    Past Surgical History:  Procedure Laterality Date  . ABDOMINAL HYSTERECTOMY    . ABDOMINAL SURGERY    . BACK SURGERY    . back surgery /ray cage fusion comberg, gso    . BIOPSY  07/31/2015   Procedure: BIOPSY (DUODENAL, GASTRIC, GASTRIC ULCER, ESOPHAGEAL);  Surgeon: Danie Binder, MD;  Location: [facility];  Service: Endoscopy;;  . CESAREAN SECTION    . CHOLECYSTECTOMY    . COLONOSCOPY  2001   Dr. Laural Golden, hemorrhoids  . COLONOSCOPY  10/21/2011   LNL:GXQJJHE polyp  multiple/internal hemorrhoids  . DILATION AND CURETTAGE OF UTERUS    . ESOPHAGEAL DILATION N/A 07/31/2015   Procedure: ESOPHAGEAL DILATION WIRE GUIDED WITH SAVORY DILATORS 15MM, 16MM;  Surgeon: Danie Binder, MD;  Location: [facility];  Service: Endoscopy;  Laterality: N/A;  . ESOPHAGOGASTRODUODENOSCOPY  11/2008   A 9-mm submucosal lesion seen in the cardia., gastritis, no hpylori  . ESOPHAGOGASTRODUODENOSCOPY (EGD) WITH PROPOFOL N/A 07/31/2015   RDE:YCXKGYJ ulcer and moderate gastritis, dysphagia, empirical dilation with no identified source. esophageal, gastric, duodenal bx unremarkable.   . ESOPHAGOGASTRODUODENOSCOPY (EGD) WITH PROPOFOL N/A 11/13/2015   SLF: 1. Gastric ulcer has healed. 2. single non-bleeding gastric AVMs 3. mild non-erosive gastritis.   Marland Kitchen ESOPHAGOGASTRODUODENOSCOPY (EGD) WITH PROPOFOL N/A 10/20/2017   Procedure: ESOPHAGOGASTRODUODENOSCOPY (EGD) WITH PROPOFOL;  Surgeon: Danie Binder, MD;  Location: [facility];  Service: Endoscopy;  Laterality: N/A;  2:00pm  . FOOT SURGERY    . GALLBLADDER SURGERY  2010  . 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  Marland Kitchen SAVORY DILATION  02/03/2012   EHU:[id number] in the distal esophagus/Mild gastritis  . toe graft    . TONSILLECTOMY      Family  History  Problem Relation Age of Onset  . Colon cancer Other   . Colon cancer Mother        diagnosed in late 68s and died age 32  . Heart failure Mother   . Heart defect Unknown        family history   . Arthritis Unknown        family history  . COPD Unknown        family history   . Cancer Unknown        multiple unknown type  . Heart attack Father        deceased at 27  . Hypertension Father   . Heart failure Father   . Lung cancer Maternal Uncle   . Throat cancer Maternal Uncle   . Colon cancer Paternal Uncle   . Colon cancer  Maternal Aunt   . Anesthesia problems Neg Hx   . Hypotension Neg Hx   . Malignant hyperthermia Neg Hx   . Pseudochol deficiency Neg Hx     Social History:  reports that she has been smoking cigarettes.  She has a 17.50 pack-year smoking history. She has never used smokeless tobacco. She reports that she drinks alcohol. She reports that she has current or past drug history. Drug: Marijuana. Frequency: 1.00 time per week.  Allergies:  Allergies  Allergen Reactions  . Aspirin Other (See Comments)    Has had part of stomach removed, cannot take aspirin   . Imitrex [Sumatriptan Base] Other (See Comments)    Fast heart rate  . Ketorolac Tromethamine Other (See Comments)    "makes my hands draw up"  . Nsaids Other (See Comments)    Due to stomach issues (has had part of stomach removed, cannot take NSAIDS OR ASA)  . Promethazine Hcl Other (See Comments)    Legs jerking -" like restless legs"  . Sulfonamide Derivatives Nausea And Vomiting    Lost consciousness     Medications:  Scheduled: . aspirin EC  81 mg Oral Daily  . buprenorphine-naloxone  1 tablet Sublingual TID  . carvedilol  6.25 mg Oral BID WC  . docusate sodium  100 mg Oral BID  . enoxaparin (LOVENOX) injection  40 mg Subcutaneous Q24H  . FLUoxetine  40 mg Oral Daily  . gabapentin  600 mg Oral TID  . lisinopril  5 mg Oral Daily  . loratadine  10 mg Oral Daily  . pantoprazole  40 mg Oral Daily  . potassium chloride  40 mEq Oral Once    Results for orders placed or performed during the hospital encounter of 03/27/18 (from the past 48 hour(s))  CBG monitoring, ED     Status: Abnormal   Collection Time: 03/27/18  7:32 AM  Result Value Ref Range   Glucose-Capillary 100 (H) 65 - 99 mg/dL  Comprehensive metabolic panel     Status: Abnormal   Collection Time: 03/27/18  7:42 AM  Result Value Ref Range   Sodium 137 135 - 145 mmol/L   Potassium 3.4 (L) 3.5 - 5.1 mmol/L   Chloride 98 (L) 101 - 111 mmol/L   CO2 29 22 - 32  mmol/L   Glucose, Bld 93 65 - 99 mg/dL   BUN 8 6 - 20 mg/dL   Creatinine, Ser 0.81 0.44 - 1.00 mg/dL   Calcium 8.8 (L) 8.9 - 10.3 mg/dL   Total Protein 6.9 6.5 - 8.1 g/dL   Albumin 3.8 3.5 - 5.0 g/dL   AST 17  15 - 41 U/L   ALT 9 (L) 14 - 54 U/L   Alkaline Phosphatase 74 38 - 126 U/L   Total Bilirubin 0.4 0.3 - 1.2 mg/dL   GFR calc non Af Amer >60 >60 mL/min   GFR calc Af Amer >60 >60 mL/min    Comment: (NOTE) The eGFR has been calculated using the CKD EPI equation. This calculation has not been validated in all clinical situations. eGFR's persistently <60 mL/min signify possible Chronic Kidney Disease.    Anion gap 10 5 - 15    Comment: Performed at [facility], [address] [place], [place] [address]  CBC     Status: Abnormal   Collection Time: 03/27/18  7:42 AM  Result Value Ref Range   WBC 5.1 4.0 - 10.5 K/uL   RBC 3.66 (L) 3.87 - 5.11 MIL/uL   Hemoglobin 10.9 (L) 12.0 - 15.0 g/dL   HCT 34.6 (L) 36.0 - 46.0 %   MCV 94.5 78.0 - 100.0 fL   MCH 29.8 26.0 - 34.0 pg   MCHC 31.5 30.0 - 36.0 g/dL   RDW 14.1 11.5 - 15.5 %   Platelets 239 150 - 400 K/uL    Comment: Performed at [facility], [address] [place], [place] [address]  Protime-INR     Status: None   Collection Time: 03/27/18  7:42 AM  Result Value Ref Range   Prothrombin Time 13.3 11.4 - 15.2 seconds   INR 1.02     Comment: Performed at [facility], [address] [place], [place] [address]  Troponin I     Status: None   Collection Time: 03/27/18  7:42 AM  Result Value Ref Range   Troponin I <0.03 <0.03 ng/mL    Comment: Performed at [facility] [address] [place], [place] [address]  Brain natriuretic peptide     Status: Abnormal   Collection Time: 03/27/18  7:42 AM  Result Value Ref Range   B Natriuretic Peptide 400.0 (H) 0.0 - 100.0 pg/mL    Comment: Performed at [facility], [address], [place], [place] [address]  Hemoglobin A1c     Status: None   Collection Time: 03/27/18  7:42 AM  Result  Value Ref Range   Hgb A1c MFr Bld 5.5 4.8 - 5.6 %    Comment: (NOTE) Pre diabetes:          5.7%-6.4% Diabetes:              >6.4% Glycemic control for   <7.0% adults with diabetes    Mean Plasma Glucose 111.15 mg/dL    Comment: Performed at [facility], [place] [address] [place], [place] [address]  TSH     Status: None   Collection Time: 03/27/18  7:42 AM  Result Value Ref Range   TSH 1.694 0.350 - 4.500 uIU/mL    Comment: Performed by a 3rd Generation assay with a functional sensitivity of <=0.01 uIU/mL. Performed at [facility], [address], [place], [place] [address]   Rapid urine drug screen (hospital performed)     Status: Abnormal   Collection Time: 03/27/18  8:39 AM  Result Value Ref Range   Opiates NONE DETECTED NONE DETECTED   Cocaine NONE DETECTED NONE DETECTED   Benzodiazepines NONE DETECTED NONE DETECTED   Amphetamines NONE DETECTED NONE DETECTED   Tetrahydrocannabinol POSITIVE (A) NONE DETECTED   Barbiturates NONE DETECTED NONE DETECTED    Comment: (NOTE) DRUG SCREEN FOR MEDICAL PURPOSES ONLY.  IF CONFIRMATION IS NEEDED FOR ANY PURPOSE, NOTIFY LAB WITHIN 5 DAYS. LOWEST DETECTABLE LIMITS FOR URINE DRUG SCREEN Drug Class                     Cutoff (ng/mL) Amphetamine and metabolites    1000 Barbiturate and metabolites    200 Benzodiazepine                 099 Tricyclics and metabolites     300 Opiates and metabolites        300 Cocaine and metabolites        300 THC                            50 Performed at [facility], [address], [place], [place] [address]   HIV antibody (Routine Testing)     Status: None   Collection Time: 03/27/18  3:32 PM  Result Value Ref Range   HIV Screen 4th Generation wRfx Non Reactive Non Reactive    Comment: (NOTE) Performed At: [facility] [place], [place] [id number] Rush Farmer MD [id number] Performed at [facility], [address], [place], [place] [address]   Troponin I-serum  (0, 3, 6 hours)     Status: None   Collection Time: 03/27/18  3:32 PM  Result Value Ref Range   Troponin I <0.03 <0.03 ng/mL    Comment: Performed at [facility], [address] [place], [place] [address]    Dg Chest 2 View  Result Date: 03/27/2018 CLINICAL DATA:  67 year old presenting with intermittent generalized weakness, particularly the lower extremities, with associated presyncope. EXAM: CHEST - 2 VIEW COMPARISON:  12/29/2013, 06/15/2013. FINDINGS: Cardiac silhouette moderately enlarged, increased in size since 2015. Thoracic aorta mildly atherosclerotic, unchanged. Hilar and mediastinal contours otherwise unremarkable. Mild to moderate diffuse interstitial pulmonary edema. No confluent airspace consolidation. Small BILATERAL pleural effusions best visualized in the POSTERIOR costophrenic sulci on the LATERAL view. Degenerative changes involving the thoracic and upper lumbar spine. IMPRESSION: 1. Mild CHF, with moderate cardiomegaly and mild diffuse interstitial pulmonary edema. 2. Small BILATERAL pleural effusions. Electronically Signed   By: Evangeline Dakin M.D.   On: 03/27/2018 08:21   ECHOCARDIOGRAM Study Conclusions  - Left ventricle: The cavity size was normal. Wall thickness was   normal. Systolic function was normal. The estimated ejection   fraction was in the range of 55% to 60%. Wall motion was normal;   there were no regional wall motion abnormalities. Doppler   parameters are consistent with high ventricular filling pressure. - Mitral valve: There was mild regurgitation. - Left atrium: The atrium was severely dilated. - Pulmonary arteries: Systolic pressure was mildly increased.  Impressions:  - Normal LV function; elevated LV filling pressure; large mass in   left atrium (probable myxoma) obstructing MV inflow (mean   gradient 8 mmHg) and mild MR; severe LAE; trace TR with mildly   elevated pulmonary pressure; results called to Dr Dyann Kief.  I personally reviewed  the echo and concur with the findings noted above Review of Systems  Constitutional: Positive for malaise/fatigue. Negative for chills and fever.  Eyes: Negative for blurred vision and double vision.  Respiratory: Positive for shortness of breath.   Cardiovascular: Positive for chest pain, orthopnea and leg swelling.  Gastrointestinal: Negative for nausea and vomiting.  Genitourinary: Negative for dysuria and hematuria.  Musculoskeletal: Positive for back pain and joint pain.  Neurological: Positive for  dizziness. Negative for loss of consciousness.  Psychiatric/Behavioral:       Chronic pain, polysubstance abuse history   Blood pressure 130/81, pulse (!) 55, temperature 98.7 F (37.1 C), temperature source Oral, resp. rate 18, height _0  (1.575 m), weight 118 lb 12.8 oz (53.9 kg), SpO2 94 %. Physical Exam  Vitals reviewed. Constitutional: She is oriented to person, place, and time. No distress.  Thin, appears older than stated age  HENT:  Head: Normocephalic and atraumatic.  Mouth/Throat: No oropharyngeal exudate.  Eyes: Conjunctivae and EOM are normal. No scleral icterus.  Neck: No JVD present. No thyromegaly present.  Cardiovascular: Normal rate and regular rhythm.  Murmur (2/6 diastolic murmur at apex) heard. Respiratory: Effort normal. No respiratory distress. She has no wheezes. She has rales.  GI: Soft. She exhibits no distension. There is no tenderness.  Musculoskeletal: She exhibits no edema.  Neurological: She is alert and oriented to person, place, and time. No cranial nerve deficit. She exhibits normal muscle tone.  Skin: Skin is warm and dry. She is not diaphoretic.    Assessment/Plan: 67 yo woman with a past history significant for tobacco abuse, GERD, and chronic pain who presents with SOB, fatigue and presyncope who has been found to have a large left atrial myxoma. She will need surgical resection.  She has been added to cath schedule for tomorrow. Will plan  surgery later this week, either Wed or Thurs as schedule allows.  I discussed the need for surgery and described the proposed procedure of resection of left atrial myxoma to Ms. Hurless. She understands the need for general anesthesia, the incisions to be used and the need for cardiopulmonary bypass. We discussed the expected hospital stay, overall recovery and short and long term outcomes. She understands the risks include but are not limited to death, stroke, MI, DVT/PE, bleeding, possible need for transfusion, infections, cardiac arrhythmias, as well asother organ system dysfunction including respiratory, renal, or GI complications.   She accepts the risks and agrees to proceed.  Melrose Nakayama 03/28/2018, 5:13 PM

## 2018-03-28 NOTE — Progress Notes (Signed)
Mrs. Jacqueline [name] called out to state she intermittently fells the need to take a deep breath, states she may have some anxiety, but is alarmed that she is having breathing trouble.  BBS clear with crackles in the bases, O2 sat 96 % RA, all other VSS.  Mrs.Jacqueline [name] is also concerned that she needs to stop her suboxone prior to surgery, per the Suboxone clinic she attends. Call placed to Dr. Wynelle Cleveland and was instructed to give the suboxone and to continue to monitor Mrs. Jacqueline [name] for her breathing.

## 2018-03-28 NOTE — Plan of Care (Signed)
Care plan reviewed and patient is progressing.  

## 2018-03-28 NOTE — Progress Notes (Addendum)
PROGRESS NOTE    Jacqueline [name]   GUY:[id number]  DOB: 1951-06-19  DOA: 03/27/2018 PCP: Neale Burly, MD   Brief Narrative:  Jacqueline [name] is a 67 year old female cigarette-smoker, 1/2 pack daily, peripheral neuropathy, chronic constipation (last BM was on 4/2), GERD and chronic pain in neck and all of her back for which she is on Suboxone by pain management, Dr Mirna Mires.   Her medical record review shows that she has been known to use cocaine, opiates and marijuana. The patient states she still smokes marijuana regularly, but does not use any other illicit drugs  (clean for over 2 years).  Since this summer she has been having shortness of breath and dizziness on exertion. Symptoms were severe over the past couple of days prior to admission and she could barely walk without dizziness and dyspnea.  ED:  CXR > moderately enlarged cardiac silhouette, mild CHF with mild diffuse interstitial edema, and small bilateral pleural effusions. BNP at 400 and negative troponin I UDS + for marijuana Admitted for ACS rule out.   2 d ECHO> Normal LV function; elevated LV filling pressure; large mass in   left atrium (probable myxoma) obstructing MV inflow (mean   gradient 8 mmHg) and mild MR; severe LAE; trace TR with mildly   elevated pulmonary pressure  Transferred from [facility] to [facility] for CT surgery eval.   Subjective: States she has has nausea a lot due to her GERD, has constipation- no vomiting , no cough, no dyspnea at rest, no dysuria. No other complaints.   Assessment & Plan:   Principal Problem: Dyspnea on exertion with lightheaded sensation - could have many reasons for this including: CAD, COPD and atrial mass - pulse ox 100 % on room air but has crackles in b/l bases - appreciate cardiology consult> will have cath tomorrow per cardiology - CT surgery to evaluate left atrial mass - will need outpt PFTs and will need to quit smoking  Active Problems:  HTN - Lisinopril and Coreg  started on admission  Hypokalemia- 3.4 yesterday - replace and recheck tomorrow  Chronic neck and back pain/ Arthritis -  Con Suboxone, Voltaren gel, Neutontin and Flexeril which she takes at home  GERD - takes Prilosec at home- cont Protonix- avoid NSAIDS  Smoker - states Nicotine patch give her a headache and she has declined it- have d/c'd the order - advised to stop smoking  Depression - Prozac  H/o drug abuse - stopped Cocaine - continues to use Marijuana UDS +  Chronic constipation/ on narcotics - cont Miralax PRN and Colace BID    DVT prophylaxis: Lovenox Code Status: Full code Family Communication:   Disposition Plan: f/u on cath and CT surg eval Consultants:   Cardiology  CT surgery Procedures:   2 D ECHO Antimicrobials:  Anti-infectives (From admission, onward)   None       Objective: Vitals:   03/27/18 2055 03/28/18 0345 03/28/18 0931 03/28/18 1239  BP: 121/79 139/78 (!) 130/101 130/81  Pulse:   70 (!) 55  Resp:   18 18  Temp: 98.4 F (36.9 C) 98.4 F (36.9 C) 98.1 F (36.7 C) 98.7 F (37.1 C)  TempSrc: Oral Oral  Oral  SpO2:   98% 94%  Weight:      Height:        Intake/Output Summary (Last 24 hours) at 03/28/2018 1422 Last data filed at 03/28/2018 1300 Gross per 24 hour  Intake 1080 ml  Output 300  ml  Net 780 ml   Filed Weights   03/27/18 0710 03/27/18 1827  Weight: 56.7 kg (125 lb) 53.9 kg (118 lb 12.8 oz)    Examination: General exam: Appears comfortable  HEENT: PERRLA, oral mucosa moist, no sclera icterus or thrush Respiratory system: Clear to auscultation. Respiratory effort normal. Pulse ox 100% on room air Cardiovascular system: S1 & S2 heard, RRR.   Gastrointestinal system: Abdomen soft, non-tender, nondistended. Normal bowel sound. No organomegaly Central nervous system: Alert and oriented. No focal neurological deficits. Extremities: No cyanosis, clubbing or edema Skin: No rashes or ulcers Psychiatry:  Mood & affect  appropriate.     Data Reviewed: I have personally reviewed following labs and imaging studies  CBC: Recent Labs  Lab 03/27/18 0742  WBC 5.1  HGB 10.9*  HCT 34.6*  MCV 94.5  PLT 242   Basic Metabolic Panel: Recent Labs  Lab 03/27/18 0742  NA 137  K 3.4*  CL 98*  CO2 29  GLUCOSE 93  BUN 8  CREATININE 0.81  CALCIUM 8.8*   GFR: Estimated Creatinine Clearance: 55.5 mL/min (by C-G formula based on SCr of 0.81 mg/dL). Liver Function Tests: Recent Labs  Lab 03/27/18 0742  AST 17  ALT 9*  ALKPHOS 74  BILITOT 0.4  PROT 6.9  ALBUMIN 3.8   No results for input(s): LIPASE, AMYLASE in the last 168 hours. No results for input(s): AMMONIA in the last 168 hours. Coagulation Profile: Recent Labs  Lab 03/27/18 0742  INR 1.02   Cardiac Enzymes: Recent Labs  Lab 03/27/18 0742 03/27/18 1532  TROPONINI <0.03 <0.03   BNP (last 3 results) No results for input(s): PROBNP in the last 8760 hours. HbA1C: Recent Labs    03/27/18 0742  HGBA1C 5.5   CBG: Recent Labs  Lab 03/27/18 0732  GLUCAP 100*   Lipid Profile: No results for input(s): CHOL, HDL, LDLCALC, TRIG, CHOLHDL, LDLDIRECT in the last 72 hours. Thyroid Function Tests: Recent Labs    03/27/18 0742  TSH 1.694   Anemia Panel: No results for input(s): VITAMINB12, FOLATE, FERRITIN, TIBC, IRON, RETICCTPCT in the last 72 hours. Urine analysis:    Component Value Date/Time   COLORURINE YELLOW 05/02/2012 1919   APPEARANCEUR CLEAR 05/02/2012 1919   LABSPEC 1.010 05/02/2012 1919   PHURINE 7.5 05/02/2012 1919   GLUCOSEU NEGATIVE 05/02/2012 1919   HGBUR TRACE (A) 05/02/2012 1919   BILIRUBINUR NEGATIVE 05/02/2012 1919   KETONESUR NEGATIVE 05/02/2012 1919   PROTEINUR NEGATIVE 05/02/2012 1919   UROBILINOGEN 0.2 05/02/2012 1919   NITRITE NEGATIVE 05/02/2012 1919   LEUKOCYTESUR NEGATIVE 05/02/2012 1919   Sepsis Labs: @LABRCNTIP (procalcitonin:4,lacticidven:4) )No results found for this or any previous visit  (from the past 240 hour(s)).       Radiology Studies: Dg Chest 2 View  Result Date: 03/27/2018 CLINICAL DATA:  67 year old presenting with intermittent generalized weakness, particularly the lower extremities, with associated presyncope. EXAM: CHEST - 2 VIEW COMPARISON:  12/29/2013, 06/15/2013. FINDINGS: Cardiac silhouette moderately enlarged, increased in size since 2015. Thoracic aorta mildly atherosclerotic, unchanged. Hilar and mediastinal contours otherwise unremarkable. Mild to moderate diffuse interstitial pulmonary edema. No confluent airspace consolidation. Small BILATERAL pleural effusions best visualized in the POSTERIOR costophrenic sulci on the LATERAL view. Degenerative changes involving the thoracic and upper lumbar spine. IMPRESSION: 1. Mild CHF, with moderate cardiomegaly and mild diffuse interstitial pulmonary edema. 2. Small BILATERAL pleural effusions. Electronically Signed   By: Evangeline Dakin M.D.   On: 03/27/2018 08:21  Scheduled Meds: . aspirin EC  81 mg Oral Daily  . buprenorphine-naloxone  1 tablet Sublingual TID  . carvedilol  6.25 mg Oral BID WC  . docusate sodium  100 mg Oral BID  . enoxaparin (LOVENOX) injection  40 mg Subcutaneous Q24H  . FLUoxetine  40 mg Oral Daily  . gabapentin  600 mg Oral TID  . lisinopril  5 mg Oral Daily  . loratadine  10 mg Oral Daily  . pantoprazole  40 mg Oral Daily   Continuous Infusions:   LOS: 0 days    Time spent in minutes: [place], MD [facility] Pager: [email or web address] Password [email or web address] 03/28/2018, 2:22 PM

## 2018-03-28 NOTE — H&P (View-Only) (Signed)
Reason for Consult:Left atrial myxoma Referring Physician: Dr. Wynelle Cleveland [facility]  Jacqueline [name] is an 67 y.o. female.  HPI: 67 yo woman with no prior cardiac history presents with a cc/o SOB  Jacqueline [name] is a 67 yo woman with a history of tobacco abuse, anxiety, migraines, chronic back pain, IBS, GERD and a remote history of a gastric tumor. She presents with several month history of shortness of breath and fatigue. First noticed it last summer. Has progressively gotten worse. She has been having problems with dizziness recently. No LOC. The day prior to admission she developed severe fatigue and weakness while doing light house work. Sat down and then could not get up. Also some sensation of heaviness in chest.  She came to the ED and was noted to be in CHF. EKG had no ischemic changes. She had an echo which showed a large left atrial myxoma. She currently is symptom free sitting in bed.   Past Medical History:  Diagnosis Date  . Anxiety   . Chronic back pain   . Drug-seeking behavior   . Gastric nodule 2009   EUS, ?leiomyoma  . 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 GERD (gastroesophageal reflux disease)   . Gout   . History of pancreatitis    Biliary and/or etoh?  . Irritable bowel syndrome   . Knee pain   . Migraines   . Migraines   . Peripheral neuropathy   . Polysubstance abuse ([place])    opiates, cocaine, marijuana    Past Surgical History:  Procedure Laterality Date  . ABDOMINAL HYSTERECTOMY    . ABDOMINAL SURGERY    . BACK SURGERY    . back surgery /ray cage fusion comberg, gso    . BIOPSY  07/31/2015   Procedure: BIOPSY (DUODENAL, GASTRIC, GASTRIC ULCER, ESOPHAGEAL);  Surgeon: Danie Binder, MD;  Location: [facility];  Service: Endoscopy;;  . CESAREAN SECTION    . CHOLECYSTECTOMY    . COLONOSCOPY  2001   Dr. Laural Golden, hemorrhoids  . COLONOSCOPY  10/21/2011   LNL:GXQJJHE polyp  multiple/internal hemorrhoids  . DILATION AND CURETTAGE OF UTERUS    . ESOPHAGEAL DILATION N/A 07/31/2015   Procedure: ESOPHAGEAL DILATION WIRE GUIDED WITH SAVORY DILATORS 15MM, 16MM;  Surgeon: Danie Binder, MD;  Location: [facility];  Service: Endoscopy;  Laterality: N/A;  . ESOPHAGOGASTRODUODENOSCOPY  11/2008   A 9-mm submucosal lesion seen in the cardia., gastritis, no hpylori  . ESOPHAGOGASTRODUODENOSCOPY (EGD) WITH PROPOFOL N/A 07/31/2015   RDE:YCXKGYJ ulcer and moderate gastritis, dysphagia, empirical dilation with no identified source. esophageal, gastric, duodenal bx unremarkable.   . ESOPHAGOGASTRODUODENOSCOPY (EGD) WITH PROPOFOL N/A 11/13/2015   SLF: 1. Gastric ulcer has healed. 2. single non-bleeding gastric AVMs 3. mild non-erosive gastritis.   Marland Kitchen ESOPHAGOGASTRODUODENOSCOPY (EGD) WITH PROPOFOL N/A 10/20/2017   Procedure: ESOPHAGOGASTRODUODENOSCOPY (EGD) WITH PROPOFOL;  Surgeon: Danie Binder, MD;  Location: [facility];  Service: Endoscopy;  Laterality: N/A;  2:00pm  . FOOT SURGERY    . GALLBLADDER SURGERY  2010  . 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  Marland Kitchen SAVORY DILATION  02/03/2012   EHU:[id number] in the distal esophagus/Mild gastritis  . toe graft    . TONSILLECTOMY      Family  History  Problem Relation Age of Onset  . Colon cancer Other   . Colon cancer Mother        diagnosed in late 68s and died age 32  . Heart failure Mother   . Heart defect Unknown        family history   . Arthritis Unknown        family history  . COPD Unknown        family history   . Cancer Unknown        multiple unknown type  . Heart attack Father        deceased at 27  . Hypertension Father   . Heart failure Father   . Lung cancer Maternal Uncle   . Throat cancer Maternal Uncle   . Colon cancer Paternal Uncle   . Colon cancer  Maternal Aunt   . Anesthesia problems Neg Hx   . Hypotension Neg Hx   . Malignant hyperthermia Neg Hx   . Pseudochol deficiency Neg Hx     Social History:  reports that she has been smoking cigarettes.  She has a 17.50 pack-year smoking history. She has never used smokeless tobacco. She reports that she drinks alcohol. She reports that she has current or past drug history. Drug: Marijuana. Frequency: 1.00 time per week.  Allergies:  Allergies  Allergen Reactions  . Aspirin Other (See Comments)    Has had part of stomach removed, cannot take aspirin   . Imitrex [Sumatriptan Base] Other (See Comments)    Fast heart rate  . Ketorolac Tromethamine Other (See Comments)    "makes my hands draw up"  . Nsaids Other (See Comments)    Due to stomach issues (has had part of stomach removed, cannot take NSAIDS OR ASA)  . Promethazine Hcl Other (See Comments)    Legs jerking -" like restless legs"  . Sulfonamide Derivatives Nausea And Vomiting    Lost consciousness     Medications:  Scheduled: . aspirin EC  81 mg Oral Daily  . buprenorphine-naloxone  1 tablet Sublingual TID  . carvedilol  6.25 mg Oral BID WC  . docusate sodium  100 mg Oral BID  . enoxaparin (LOVENOX) injection  40 mg Subcutaneous Q24H  . FLUoxetine  40 mg Oral Daily  . gabapentin  600 mg Oral TID  . lisinopril  5 mg Oral Daily  . loratadine  10 mg Oral Daily  . pantoprazole  40 mg Oral Daily  . potassium chloride  40 mEq Oral Once    Results for orders placed or performed during the hospital encounter of 03/27/18 (from the past 48 hour(s))  CBG monitoring, ED     Status: Abnormal   Collection Time: 03/27/18  7:32 AM  Result Value Ref Range   Glucose-Capillary 100 (H) 65 - 99 mg/dL  Comprehensive metabolic panel     Status: Abnormal   Collection Time: 03/27/18  7:42 AM  Result Value Ref Range   Sodium 137 135 - 145 mmol/L   Potassium 3.4 (L) 3.5 - 5.1 mmol/L   Chloride 98 (L) 101 - 111 mmol/L   CO2 29 22 - 32  mmol/L   Glucose, Bld 93 65 - 99 mg/dL   BUN 8 6 - 20 mg/dL   Creatinine, Ser 0.81 0.44 - 1.00 mg/dL   Calcium 8.8 (L) 8.9 - 10.3 mg/dL   Total Protein 6.9 6.5 - 8.1 g/dL   Albumin 3.8 3.5 - 5.0 g/dL   AST 17  15 - 41 U/L   ALT 9 (L) 14 - 54 U/L   Alkaline Phosphatase 74 38 - 126 U/L   Total Bilirubin 0.4 0.3 - 1.2 mg/dL   GFR calc non Af Amer >60 >60 mL/min   GFR calc Af Amer >60 >60 mL/min    Comment: (NOTE) The eGFR has been calculated using the CKD EPI equation. This calculation has not been validated in all clinical situations. eGFR's persistently <60 mL/min signify possible Chronic Kidney Disease.    Anion gap 10 5 - 15    Comment: Performed at [facility], [address] [place], [place] [address]  CBC     Status: Abnormal   Collection Time: 03/27/18  7:42 AM  Result Value Ref Range   WBC 5.1 4.0 - 10.5 K/uL   RBC 3.66 (L) 3.87 - 5.11 MIL/uL   Hemoglobin 10.9 (L) 12.0 - 15.0 g/dL   HCT 34.6 (L) 36.0 - 46.0 %   MCV 94.5 78.0 - 100.0 fL   MCH 29.8 26.0 - 34.0 pg   MCHC 31.5 30.0 - 36.0 g/dL   RDW 14.1 11.5 - 15.5 %   Platelets 239 150 - 400 K/uL    Comment: Performed at [facility], [address] [place], [place] [address]  Protime-INR     Status: None   Collection Time: 03/27/18  7:42 AM  Result Value Ref Range   Prothrombin Time 13.3 11.4 - 15.2 seconds   INR 1.02     Comment: Performed at [facility], [address] [place], [place] [address]  Troponin I     Status: None   Collection Time: 03/27/18  7:42 AM  Result Value Ref Range   Troponin I <0.03 <0.03 ng/mL    Comment: Performed at [facility] [address] [place], [place] [address]  Brain natriuretic peptide     Status: Abnormal   Collection Time: 03/27/18  7:42 AM  Result Value Ref Range   B Natriuretic Peptide 400.0 (H) 0.0 - 100.0 pg/mL    Comment: Performed at [facility], [address], [place], [place] [address]  Hemoglobin A1c     Status: None   Collection Time: 03/27/18  7:42 AM  Result  Value Ref Range   Hgb A1c MFr Bld 5.5 4.8 - 5.6 %    Comment: (NOTE) Pre diabetes:          5.7%-6.4% Diabetes:              >6.4% Glycemic control for   <7.0% adults with diabetes    Mean Plasma Glucose 111.15 mg/dL    Comment: Performed at [facility], [place] [address] [place], [place] [address]  TSH     Status: None   Collection Time: 03/27/18  7:42 AM  Result Value Ref Range   TSH 1.694 0.350 - 4.500 uIU/mL    Comment: Performed by a 3rd Generation assay with a functional sensitivity of <=0.01 uIU/mL. Performed at [facility], [address], [place], [place] [address]   Rapid urine drug screen (hospital performed)     Status: Abnormal   Collection Time: 03/27/18  8:39 AM  Result Value Ref Range   Opiates NONE DETECTED NONE DETECTED   Cocaine NONE DETECTED NONE DETECTED   Benzodiazepines NONE DETECTED NONE DETECTED   Amphetamines NONE DETECTED NONE DETECTED   Tetrahydrocannabinol POSITIVE (A) NONE DETECTED   Barbiturates NONE DETECTED NONE DETECTED    Comment: (NOTE) DRUG SCREEN FOR MEDICAL PURPOSES ONLY.  IF CONFIRMATION IS NEEDED FOR ANY PURPOSE, NOTIFY LAB WITHIN 5 DAYS. LOWEST DETECTABLE LIMITS FOR URINE DRUG SCREEN Drug Class                     Cutoff (ng/mL) Amphetamine and metabolites    1000 Barbiturate and metabolites    200 Benzodiazepine                 099 Tricyclics and metabolites     300 Opiates and metabolites        300 Cocaine and metabolites        300 THC                            50 Performed at [facility], [address], [place], [place] [address]   HIV antibody (Routine Testing)     Status: None   Collection Time: 03/27/18  3:32 PM  Result Value Ref Range   HIV Screen 4th Generation wRfx Non Reactive Non Reactive    Comment: (NOTE) Performed At: [facility] [place], [place] [id number] Rush Farmer MD [id number] Performed at [facility], [address], [place], [place] [address]   Troponin I-serum  (0, 3, 6 hours)     Status: None   Collection Time: 03/27/18  3:32 PM  Result Value Ref Range   Troponin I <0.03 <0.03 ng/mL    Comment: Performed at [facility], [address] [place], [place] [address]    Dg Chest 2 View  Result Date: 03/27/2018 CLINICAL DATA:  67 year old presenting with intermittent generalized weakness, particularly the lower extremities, with associated presyncope. EXAM: CHEST - 2 VIEW COMPARISON:  12/29/2013, 06/15/2013. FINDINGS: Cardiac silhouette moderately enlarged, increased in size since 2015. Thoracic aorta mildly atherosclerotic, unchanged. Hilar and mediastinal contours otherwise unremarkable. Mild to moderate diffuse interstitial pulmonary edema. No confluent airspace consolidation. Small BILATERAL pleural effusions best visualized in the POSTERIOR costophrenic sulci on the LATERAL view. Degenerative changes involving the thoracic and upper lumbar spine. IMPRESSION: 1. Mild CHF, with moderate cardiomegaly and mild diffuse interstitial pulmonary edema. 2. Small BILATERAL pleural effusions. Electronically Signed   By: Evangeline Dakin M.D.   On: 03/27/2018 08:21   ECHOCARDIOGRAM Study Conclusions  - Left ventricle: The cavity size was normal. Wall thickness was   normal. Systolic function was normal. The estimated ejection   fraction was in the range of 55% to 60%. Wall motion was normal;   there were no regional wall motion abnormalities. Doppler   parameters are consistent with high ventricular filling pressure. - Mitral valve: There was mild regurgitation. - Left atrium: The atrium was severely dilated. - Pulmonary arteries: Systolic pressure was mildly increased.  Impressions:  - Normal LV function; elevated LV filling pressure; large mass in   left atrium (probable myxoma) obstructing MV inflow (mean   gradient 8 mmHg) and mild MR; severe LAE; trace TR with mildly   elevated pulmonary pressure; results called to Dr Dyann Kief.  I personally reviewed  the echo and concur with the findings noted above Review of Systems  Constitutional: Positive for malaise/fatigue. Negative for chills and fever.  Eyes: Negative for blurred vision and double vision.  Respiratory: Positive for shortness of breath.   Cardiovascular: Positive for chest pain, orthopnea and leg swelling.  Gastrointestinal: Negative for nausea and vomiting.  Genitourinary: Negative for dysuria and hematuria.  Musculoskeletal: Positive for back pain and joint pain.  Neurological: Positive for  dizziness. Negative for loss of consciousness.  Psychiatric/Behavioral:       Chronic pain, polysubstance abuse history   Blood pressure 130/81, pulse (!) 55, temperature 98.7 F (37.1 C), temperature source Oral, resp. rate 18, height _0  (1.575 m), weight 118 lb 12.8 oz (53.9 kg), SpO2 94 %. Physical Exam  Vitals reviewed. Constitutional: She is oriented to person, place, and time. No distress.  Thin, appears older than stated age  HENT:  Head: Normocephalic and atraumatic.  Mouth/Throat: No oropharyngeal exudate.  Eyes: Conjunctivae and EOM are normal. No scleral icterus.  Neck: No JVD present. No thyromegaly present.  Cardiovascular: Normal rate and regular rhythm.  Murmur (2/6 diastolic murmur at apex) heard. Respiratory: Effort normal. No respiratory distress. She has no wheezes. She has rales.  GI: Soft. She exhibits no distension. There is no tenderness.  Musculoskeletal: She exhibits no edema.  Neurological: She is alert and oriented to person, place, and time. No cranial nerve deficit. She exhibits normal muscle tone.  Skin: Skin is warm and dry. She is not diaphoretic.    Assessment/Plan: 67 yo woman with a past history significant for tobacco abuse, GERD, and chronic pain who presents with SOB, fatigue and presyncope who has been found to have a large left atrial myxoma. She will need surgical resection.  She has been added to cath schedule for tomorrow. Will plan  surgery later this week, either Wed or Thurs as schedule allows.  I discussed the need for surgery and described the proposed procedure of resection of left atrial myxoma to Jacqueline [name]. She understands the need for general anesthesia, the incisions to be used and the need for cardiopulmonary bypass. We discussed the expected hospital stay, overall recovery and short and long term outcomes. She understands the risks include but are not limited to death, stroke, MI, DVT/PE, bleeding, possible need for transfusion, infections, cardiac arrhythmias, as well asother organ system dysfunction including respiratory, renal, or GI complications.   She accepts the risks and agrees to proceed.  Melrose Nakayama 03/28/2018, 5:13 PM

## 2018-03-28 NOTE — H&P (View-Only) (Signed)
Cardiology Consultation:   Patient ID: Jacqueline [name]; [id number]; 06/18/1951   Admit date: 03/27/2018 Date of Consult: 03/28/2018  Primary Care Provider: Neale Burly, MD Primary Cardiologist: Nahser, new  Primary Electrophysiologist:     Patient Profile:   Jacqueline [name] is a 67 y.o. female with a hx of GERD,  who is being seen today for the evaluation of atrial mass  at the request of  Dr. Wynelle Cleveland. .  History of Present Illness:   Jacqueline [name] is a 67 year old female with a history of cigarette smoking.  She has a history of chronic pain and gastroesophageal reflux.  She is been having shortness of breath for the past several months.  Her shortness of breath actually started last summer.  It was intermittent.  It is now become more consistent.  She is had some dizziness weakness and fatigue.  She is had lots of presyncope. She is been very limited in what she is able to do for the past several weeks. Denies any fever or upper respiratory tract infection syndrome.   She denies any episodes of chest is upper or chest pain.  She denies any rash or skin nodules.  She denies any bleeding in her stool.  Past Medical History:  Diagnosis Date  . Anxiety   . Chronic back pain   . Drug-seeking behavior   . Gastric nodule 2009   EUS, ?leiomyoma  . 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 GERD (gastroesophageal reflux disease)   . Gout   . History of pancreatitis    Biliary and/or etoh?  . Irritable bowel syndrome   . Knee pain   . Migraines   . Migraines   . Peripheral neuropathy   . Polysubstance abuse ([place])    opiates, cocaine, marijuana    Past Surgical History:  Procedure Laterality Date  . ABDOMINAL HYSTERECTOMY    . ABDOMINAL SURGERY    . BACK SURGERY    . back surgery /ray cage fusion comberg, gso    . BIOPSY  07/31/2015   Procedure: BIOPSY (DUODENAL, GASTRIC, GASTRIC ULCER,  ESOPHAGEAL);  Surgeon: Danie Binder, MD;  Location: [facility];  Service: Endoscopy;;  . CESAREAN SECTION    . CHOLECYSTECTOMY    . COLONOSCOPY  2001   Dr. Laural Golden, hemorrhoids  . COLONOSCOPY  10/21/2011   XLK:GMWNUUV polyp multiple/internal hemorrhoids  . DILATION AND CURETTAGE OF UTERUS    . ESOPHAGEAL DILATION N/A 07/31/2015   Procedure: ESOPHAGEAL DILATION WIRE GUIDED WITH SAVORY DILATORS 15MM, 16MM;  Surgeon: Danie Binder, MD;  Location: [facility];  Service: Endoscopy;  Laterality: N/A;  . ESOPHAGOGASTRODUODENOSCOPY  11/2008   A 9-mm submucosal lesion seen in the cardia., gastritis, no hpylori  . ESOPHAGOGASTRODUODENOSCOPY (EGD) WITH PROPOFOL N/A 07/31/2015   OZD:GUYQIHK ulcer and moderate gastritis, dysphagia, empirical dilation with no identified source. esophageal, gastric, duodenal bx unremarkable.   . ESOPHAGOGASTRODUODENOSCOPY (EGD) WITH PROPOFOL N/A 11/13/2015   SLF: 1. Gastric ulcer has healed. 2. single non-bleeding gastric AVMs 3. mild non-erosive gastritis.   Marland Kitchen ESOPHAGOGASTRODUODENOSCOPY (EGD) WITH PROPOFOL N/A 10/20/2017   Procedure: ESOPHAGOGASTRODUODENOSCOPY (EGD) WITH PROPOFOL;  Surgeon: Danie Binder, MD;  Location: [facility];  Service: Endoscopy;  Laterality: N/A;  2:00pm  . FOOT SURGERY    . GALLBLADDER SURGERY  2010  . 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  Marland Kitchen SAVORY DILATION  02/03/2012   CXK:GYJEHUDJS in the distal esophagus/Mild gastritis  . toe graft    . TONSILLECTOMY       Home Medications:  Prior to Admission medications   Medication Sig Start Date End Date Taking? Authorizing Provider  acetaminophen (TYLENOL) 500 MG tablet Take 500-1,000 mg by mouth every 6 (six) hours as needed (for fever.).   Yes [provider]  cetirizine (ZYRTEC) 10 MG tablet Take 10 mg by mouth daily.   Yes [provider]  cyclobenzaprine (FLEXERIL) 10 MG tablet Take 10 mg by mouth 3 (three) times daily.  09/22/17  Yes [provider]  diclofenac sodium (VOLTAREN) 1 % GEL Apply 1 g topically 4 (four) times daily as needed (for arthritis pain.).   Yes [provider]  diphenhydrAMINE (BENADRYL) 25 mg capsule Take 25 mg by mouth every 6 (six) hours as needed (for allergies.).   Yes [provider]  FLUoxetine (PROZAC) 40 MG capsule Take 40 mg by mouth daily.  09/22/17  Yes [provider]  gabapentin (NEURONTIN) 300 MG capsule Take 2 capsules (600 mg total) by mouth 3 (three) times daily. 04/30/15  Yes Niel Hummer, NP  ibuprofen (ADVIL,MOTRIN) 200 MG tablet Take 800 mg by mouth every 6 (six) hours as needed.   Yes [provider]  omeprazole (PRILOSEC) 20 MG capsule Take 20 mg by mouth daily.   Yes [provider]  ondansetron (ZOFRAN) 4 MG tablet TAKE 1 TABLET(4 MG) BY MOUTH EVERY 8 HOURS AS NEEDED FOR NAUSEA OR VOMITING 11/11/17  Yes Annitta Needs, NP  SUBOXONE 8-2 MG FILM Take 1 Film by mouth 3 (three) times daily.  10/02/17  Yes [provider]  dexlansoprazole (DEXILANT) 60 MG capsule Take 1 capsule (60 mg total) daily before breakfast by mouth. Patient not taking: Reported on 03/27/2018 10/26/17   Carlis Stable, NP    Inpatient Medications: Scheduled Meds: . aspirin EC  81 mg Oral Daily  . buprenorphine-naloxone  1 tablet Sublingual TID  . carvedilol  6.25 mg Oral BID WC  . docusate sodium  100 mg Oral BID  . enoxaparin (LOVENOX) injection  40 mg Subcutaneous Q24H  . FLUoxetine  40 mg Oral Daily  . gabapentin  600 mg Oral TID  . lisinopril  5 mg Oral Daily  . loratadine  10 mg Oral Daily  . pantoprazole  40 mg Oral Daily   Continuous Infusions:  PRN Meds: acetaminophen, acetaminophen, cyclobenzaprine, ipratropium-albuterol, morphine injection, nitroGLYCERIN, ondansetron (ZOFRAN) IV, polyethylene glycol  Allergies:      Allergies  Allergen Reactions  . Aspirin Other (See Comments)    Has had part of stomach removed, cannot take aspirin   . Imitrex [Sumatriptan Base] Other (See Comments)    Fast heart rate  . Ketorolac Tromethamine Other (See Comments)    "makes my hands draw up"  . Nsaids Other (See Comments)    Due to stomach issues (has had part of stomach removed, cannot take NSAIDS OR ASA)  . Promethazine Hcl Other (See Comments)    Legs jerking -" like restless legs"  . Sulfonamide Derivatives Nausea And Vomiting    Lost consciousness     Social History:   Social History   Socioeconomic History  . Marital status: Divorced    Spouse name: Not on file  . Number of children:  2  . Years of education: 10th grade  . Highest education level: Not on file  Occupational History  . Occupation: disabled: back problems   Social Needs  . Financial resource strain: Not on file  . Food insecurity:    Worry: Not on file    Inability: Not on file  . Transportation needs:    Medical: Not on file    Non-medical: Not on file  Tobacco Use  . Smoking status: Current Every Day Smoker    Packs/day: 0.50    Years: 35.00    Pack years: 17.50    Types: Cigarettes  . Smokeless tobacco: Never Used  Substance and Sexual Activity  . Alcohol use: Yes    Comment: occasional holiday drink... pt denied alcohol use on admission to [facility]  . Drug use: Yes    Frequency: 1.0 times per week    Types: Marijuana    Comment: +cocaine on 04/2015 UDS  . Sexual activity: Not on file  Lifestyle  . Physical activity:    Days per week: Not on file    Minutes per session: Not on file  . Stress: Not on file  Relationships  . Social connections:    Talks on phone: Not on file    Gets together: Not on file    Attends religious service: Not on file    Active member of club or organization: Not on file    Attends meetings of clubs or organizations: Not on file    Relationship status: Not on file  . Intimate partner  violence:    Fear of current or ex partner: Not on file    Emotionally abused: Not on file    Physically abused: Not on file    Forced sexual activity: Not on file  Other Topics Concern  . Not on file  Social History Narrative  . Not on file    Family History:    Family History  Problem Relation Age of Onset  . Colon cancer Other   . Colon cancer Mother        diagnosed in late 18s and died age 86  . Heart failure Mother   . Heart defect Unknown        family history   . Arthritis Unknown        family history  . COPD Unknown        family history   . Cancer Unknown        multiple unknown type  . Heart attack Father        deceased at 43  . Hypertension Father   . Heart failure Father   . Lung cancer Maternal Uncle   . Throat cancer Maternal Uncle   . Colon cancer Paternal Uncle   . Colon cancer Maternal Aunt   . Anesthesia problems Neg Hx   . Hypotension Neg Hx   . Malignant hyperthermia Neg Hx   . Pseudochol deficiency Neg Hx      ROS:  Please see the history of present illness.   All other ROS reviewed and negative.     Physical Exam/Data:   Vitals:   03/27/18 2055 03/28/18 0345 03/28/18 0931 03/28/18 1239  BP: 121/79 139/78 (!) 130/101 130/81  Pulse:   70 (!) 55  Resp:   18 18  Temp: 98.4 F (36.9 C) 98.4 F (36.9 C) 98.1 F (36.7 C) 98.7 F (37.1 C)  TempSrc: Oral Oral  Oral  SpO2:   98% 94%  Weight:      Height:        Intake/Output Summary (Last 24 hours) at 03/28/2018 1250 Last data filed at 03/28/2018 1241 Gross per 24 hour  Intake 840 ml  Output 300 ml  Net 540 ml   Filed Weights   03/27/18 0710 03/27/18 1827  Weight: 125 lb (56.7 kg) 118 lb 12.8 oz (53.9 kg)   Body mass index is 21.73 kg/m.  General: Middle-aged female.  She appears to be older  than her stated age. HEENT: normal Lymph: no adenopathy Neck: no JVD Endocrine:  No thryomegaly Vascular: No carotid bruits; FA pulses 2+ bilaterally without bruits  Cardiac: Regular  rate S1-S2.  She has a diastolic murmur in the  left axillary region  Lungs:  clear to auscultation bilaterally, no wheezing, rhonchi or rales  Abd: soft, nontender, no hepatomegaly  Ext: no edema Musculoskeletal:  No deformities, BUE and BLE strength normal and equal Skin: warm and dry  Neuro:  CNs 2-12 intact, no focal abnormalities noted Psych:  Normal affect   EKG:  The EKG was personally reviewed and demonstrates:   NSR no ST or T wave changes.  Telemetry:  Telemetry was personally reviewed and demonstrates:  NSR   Relevant CV Studies:   Laboratory Data:  Chemistry Recent Labs  Lab 03/27/18 0742  NA 137  K 3.4*  CL 98*  CO2 29  GLUCOSE 93  BUN 8  CREATININE 0.81  CALCIUM 8.8*  GFRNONAA >60  GFRAA >60  ANIONGAP 10    Recent Labs  Lab 03/27/18 0742  PROT 6.9  ALBUMIN 3.8  AST 17  ALT 9*  ALKPHOS 74  BILITOT 0.4   Hematology Recent Labs  Lab 03/27/18 0742  WBC 5.1  RBC 3.66*  HGB 10.9*  HCT 34.6*  MCV 94.5  MCH 29.8  MCHC 31.5  RDW 14.1  PLT 239   Cardiac Enzymes Recent Labs  Lab 03/27/18 0742 03/27/18 1532  TROPONINI <0.03 <0.03   No results for input(s): TROPIPOC in the last 168 hours.  BNP Recent Labs  Lab 03/27/18 0742  BNP 400.0*    DDimer No results for input(s): DDIMER in the last 168 hours.  Radiology/Studies:  Dg Chest 2 View  Result Date: 03/27/2018 CLINICAL DATA:  67 year old presenting with intermittent generalized weakness, particularly the lower extremities, with associated presyncope. EXAM: CHEST - 2 VIEW COMPARISON:  12/29/2013, 06/15/2013. FINDINGS: Cardiac silhouette moderately enlarged, increased in size since 2015. Thoracic aorta mildly atherosclerotic, unchanged. Hilar and mediastinal contours otherwise unremarkable. Mild to moderate diffuse interstitial pulmonary edema. No confluent airspace consolidation. Small BILATERAL pleural effusions best visualized in the POSTERIOR costophrenic sulci on the LATERAL view.  Degenerative changes involving the thoracic and upper lumbar spine. IMPRESSION: 1. Mild CHF, with moderate cardiomegaly and mild diffuse interstitial pulmonary edema. 2. Small BILATERAL pleural effusions. Electronically Signed   By: Evangeline Dakin M.D.   On: 03/27/2018 08:21    Assessment and Plan:   1. Left atrial mass: I personally reviewed the echocardiogram.  The left atrial mass is quite large-4 x 5 cm and is consistent with an atrial myxoma.  It is grown to the size where it is almost occluding the mitral valve inflow.  She will need to have this removed fairly soon.  She has a long history of cigarette smoking.  I think that she needs a heart catheterization prior to her left atrial myxoma surgery. Discussed the risks, benefits, and options concerning heart catheterization.  She understands  and agrees to proceed.  Put on the schedule for tomorrow.  Have advised smoking cessation.  For questions or updates, please contact [place] Please consult [email or web address] for contact info under Cardiology/STEMI.   Signed, Mertie Moores, MD  03/28/2018 12:50 PM

## 2018-03-28 NOTE — Consult Note (Signed)
Cardiology Consultation:   Patient ID: Jacqueline [name]; [id number]; 1951/07/29   Admit date: 03/27/2018 Date of Consult: 03/28/2018  Primary Care Provider: Neale Burly, MD Primary Cardiologist: Nahser, new  Primary Electrophysiologist:     Patient Profile:   Jacqueline [name] is a 67 y.o. female with a hx of GERD,  who is being seen today for the evaluation of atrial mass  at the request of  Dr. Wynelle Cleveland. .  History of Present Illness:   Jacqueline [name] is a 67 year old female with a history of cigarette smoking.  She has a history of chronic pain and gastroesophageal reflux.  She is been having shortness of breath for the past several months.  Her shortness of breath actually started last summer.  It was intermittent.  It is now become more consistent.  She is had some dizziness weakness and fatigue.  She is had lots of presyncope. She is been very limited in what she is able to do for the past several weeks. Denies any fever or upper respiratory tract infection syndrome.   She denies any episodes of chest is upper or chest pain.  She denies any rash or skin nodules.  She denies any bleeding in her stool.  Past Medical History:  Diagnosis Date  . Anxiety   . Chronic back pain   . Drug-seeking behavior   . Gastric nodule 2009   EUS, ?leiomyoma  . 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 GERD (gastroesophageal reflux disease)   . Gout   . History of pancreatitis    Biliary and/or etoh?  . Irritable bowel syndrome   . Knee pain   . Migraines   . Migraines   . Peripheral neuropathy   . Polysubstance abuse ([place])    opiates, cocaine, marijuana    Past Surgical History:  Procedure Laterality Date  . ABDOMINAL HYSTERECTOMY    . ABDOMINAL SURGERY    . BACK SURGERY    . back surgery /ray cage fusion comberg, gso    . BIOPSY  07/31/2015   Procedure: BIOPSY (DUODENAL, GASTRIC, GASTRIC ULCER,  ESOPHAGEAL);  Surgeon: Danie Binder, MD;  Location: [facility];  Service: Endoscopy;;  . CESAREAN SECTION    . CHOLECYSTECTOMY    . COLONOSCOPY  2001   Dr. Laural Golden, hemorrhoids  . COLONOSCOPY  10/21/2011   JAS:[id number] polyp multiple/internal hemorrhoids  . DILATION AND CURETTAGE OF UTERUS    . ESOPHAGEAL DILATION N/A 07/31/2015   Procedure: ESOPHAGEAL DILATION WIRE GUIDED WITH SAVORY DILATORS 15MM, 16MM;  Surgeon: Danie Binder, MD;  Location: [facility];  Service: Endoscopy;  Laterality: N/A;  . ESOPHAGOGASTRODUODENOSCOPY  11/2008   A 9-mm submucosal lesion seen in the cardia., gastritis, no hpylori  . ESOPHAGOGASTRODUODENOSCOPY (EGD) WITH PROPOFOL N/A 07/31/2015   BHA:LPFXTKW ulcer and moderate gastritis, dysphagia, empirical dilation with no identified source. esophageal, gastric, duodenal bx unremarkable.   . ESOPHAGOGASTRODUODENOSCOPY (EGD) WITH PROPOFOL N/A 11/13/2015   SLF: 1. Gastric ulcer has healed. 2. single non-bleeding gastric AVMs 3. mild non-erosive gastritis.   Marland Kitchen ESOPHAGOGASTRODUODENOSCOPY (EGD) WITH PROPOFOL N/A 10/20/2017   Procedure: ESOPHAGOGASTRODUODENOSCOPY (EGD) WITH PROPOFOL;  Surgeon: Danie Binder, MD;  Location: [facility];  Service: Endoscopy;  Laterality: N/A;  2:00pm  . FOOT SURGERY    . GALLBLADDER SURGERY  2010  . 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  Marland Kitchen SAVORY DILATION  02/03/2012   TFT:DDUKGURKY in the distal esophagus/Mild gastritis  . toe graft    . TONSILLECTOMY       Home Medications:  Prior to Admission medications   Medication Sig Start Date End Date Taking? Authorizing Provider  acetaminophen (TYLENOL) 500 MG tablet Take 500-1,000 mg by mouth every 6 (six) hours as needed (for fever.).   Yes [provider]  cetirizine (ZYRTEC) 10 MG tablet Take 10 mg by mouth daily.   Yes [provider]  cyclobenzaprine (FLEXERIL) 10 MG tablet Take 10 mg by mouth 3 (three) times daily.  09/22/17  Yes [provider]  diclofenac sodium (VOLTAREN) 1 % GEL Apply 1 g topically 4 (four) times daily as needed (for arthritis pain.).   Yes [provider]  diphenhydrAMINE (BENADRYL) 25 mg capsule Take 25 mg by mouth every 6 (six) hours as needed (for allergies.).   Yes [provider]  FLUoxetine (PROZAC) 40 MG capsule Take 40 mg by mouth daily.  09/22/17  Yes [provider]  gabapentin (NEURONTIN) 300 MG capsule Take 2 capsules (600 mg total) by mouth 3 (three) times daily. 04/30/15  Yes Niel Hummer, NP  ibuprofen (ADVIL,MOTRIN) 200 MG tablet Take 800 mg by mouth every 6 (six) hours as needed.   Yes [provider]  omeprazole (PRILOSEC) 20 MG capsule Take 20 mg by mouth daily.   Yes [provider]  ondansetron (ZOFRAN) 4 MG tablet TAKE 1 TABLET(4 MG) BY MOUTH EVERY 8 HOURS AS NEEDED FOR NAUSEA OR VOMITING 11/11/17  Yes Annitta Needs, NP  SUBOXONE 8-2 MG FILM Take 1 Film by mouth 3 (three) times daily.  10/02/17  Yes [provider]  dexlansoprazole (DEXILANT) 60 MG capsule Take 1 capsule (60 mg total) daily before breakfast by mouth. Patient not taking: Reported on 03/27/2018 10/26/17   Carlis Stable, NP    Inpatient Medications: Scheduled Meds: . aspirin EC  81 mg Oral Daily  . buprenorphine-naloxone  1 tablet Sublingual TID  . carvedilol  6.25 mg Oral BID WC  . docusate sodium  100 mg Oral BID  . enoxaparin (LOVENOX) injection  40 mg Subcutaneous Q24H  . FLUoxetine  40 mg Oral Daily  . gabapentin  600 mg Oral TID  . lisinopril  5 mg Oral Daily  . loratadine  10 mg Oral Daily  . pantoprazole  40 mg Oral Daily   Continuous Infusions:  PRN Meds: acetaminophen, acetaminophen, cyclobenzaprine, ipratropium-albuterol, morphine injection, nitroGLYCERIN, ondansetron (ZOFRAN) IV, polyethylene glycol  Allergies:      Allergies  Allergen Reactions  . Aspirin Other (See Comments)    Has had part of stomach removed, cannot take aspirin   . Imitrex [Sumatriptan Base] Other (See Comments)    Fast heart rate  . Ketorolac Tromethamine Other (See Comments)    "makes my hands draw up"  . Nsaids Other (See Comments)    Due to stomach issues (has had part of stomach removed, cannot take NSAIDS OR ASA)  . Promethazine Hcl Other (See Comments)    Legs jerking -" like restless legs"  . Sulfonamide Derivatives Nausea And Vomiting    Lost consciousness     Social History:   Social History   Socioeconomic History  . Marital status: Divorced    Spouse name: Not on file  . Number of children:  2  . Years of education: 10th grade  . Highest education level: Not on file  Occupational History  . Occupation: disabled: back problems   Social Needs  . Financial resource strain: Not on file  . Food insecurity:    Worry: Not on file    Inability: Not on file  . Transportation needs:    Medical: Not on file    Non-medical: Not on file  Tobacco Use  . Smoking status: Current Every Day Smoker    Packs/day: 0.50    Years: 35.00    Pack years: 17.50    Types: Cigarettes  . Smokeless tobacco: Never Used  Substance and Sexual Activity  . Alcohol use: Yes    Comment: occasional holiday drink... pt denied alcohol use on admission to [facility]  . Drug use: Yes    Frequency: 1.0 times per week    Types: Marijuana    Comment: +cocaine on 04/2015 UDS  . Sexual activity: Not on file  Lifestyle  . Physical activity:    Days per week: Not on file    Minutes per session: Not on file  . Stress: Not on file  Relationships  . Social connections:    Talks on phone: Not on file    Gets together: Not on file    Attends religious service: Not on file    Active member of club or organization: Not on file    Attends meetings of clubs or organizations: Not on file    Relationship status: Not on file  . Intimate partner  violence:    Fear of current or ex partner: Not on file    Emotionally abused: Not on file    Physically abused: Not on file    Forced sexual activity: Not on file  Other Topics Concern  . Not on file  Social History Narrative  . Not on file    Family History:    Family History  Problem Relation Age of Onset  . Colon cancer Other   . Colon cancer Mother        diagnosed in late 76s and died age 46  . Heart failure Mother   . Heart defect Unknown        family history   . Arthritis Unknown        family history  . COPD Unknown        family history   . Cancer Unknown        multiple unknown type  . Heart attack Father        deceased at 63  . Hypertension Father   . Heart failure Father   . Lung cancer Maternal Uncle   . Throat cancer Maternal Uncle   . Colon cancer Paternal Uncle   . Colon cancer Maternal Aunt   . Anesthesia problems Neg Hx   . Hypotension Neg Hx   . Malignant hyperthermia Neg Hx   . Pseudochol deficiency Neg Hx      ROS:  Please see the history of present illness.   All other ROS reviewed and negative.     Physical Exam/Data:   Vitals:   03/27/18 2055 03/28/18 0345 03/28/18 0931 03/28/18 1239  BP: 121/79 139/78 (!) 130/101 130/81  Pulse:   70 (!) 55  Resp:   18 18  Temp: 98.4 F (36.9 C) 98.4 F (36.9 C) 98.1 F (36.7 C) 98.7 F (37.1 C)  TempSrc: Oral Oral  Oral  SpO2:   98% 94%  Weight:      Height:        Intake/Output Summary (Last 24 hours) at 03/28/2018 1250 Last data filed at 03/28/2018 1241 Gross per 24 hour  Intake 840 ml  Output 300 ml  Net 540 ml   Filed Weights   03/27/18 0710 03/27/18 1827  Weight: 125 lb (56.7 kg) 118 lb 12.8 oz (53.9 kg)   Body mass index is 21.73 kg/m.  General: Middle-aged female.  She appears to be older  than her stated age. HEENT: normal Lymph: no adenopathy Neck: no JVD Endocrine:  No thryomegaly Vascular: No carotid bruits; FA pulses 2+ bilaterally without bruits  Cardiac: Regular  rate S1-S2.  She has a diastolic murmur in the  left axillary region  Lungs:  clear to auscultation bilaterally, no wheezing, rhonchi or rales  Abd: soft, nontender, no hepatomegaly  Ext: no edema Musculoskeletal:  No deformities, BUE and BLE strength normal and equal Skin: warm and dry  Neuro:  CNs 2-12 intact, no focal abnormalities noted Psych:  Normal affect   EKG:  The EKG was personally reviewed and demonstrates:   NSR no ST or T wave changes.  Telemetry:  Telemetry was personally reviewed and demonstrates:  NSR   Relevant CV Studies:   Laboratory Data:  Chemistry Recent Labs  Lab 03/27/18 0742  NA 137  K 3.4*  CL 98*  CO2 29  GLUCOSE 93  BUN 8  CREATININE 0.81  CALCIUM 8.8*  GFRNONAA >60  GFRAA >60  ANIONGAP 10    Recent Labs  Lab 03/27/18 0742  PROT 6.9  ALBUMIN 3.8  AST 17  ALT 9*  ALKPHOS 74  BILITOT 0.4   Hematology Recent Labs  Lab 03/27/18 0742  WBC 5.1  RBC 3.66*  HGB 10.9*  HCT 34.6*  MCV 94.5  MCH 29.8  MCHC 31.5  RDW 14.1  PLT 239   Cardiac Enzymes Recent Labs  Lab 03/27/18 0742 03/27/18 1532  TROPONINI <0.03 <0.03   No results for input(s): TROPIPOC in the last 168 hours.  BNP Recent Labs  Lab 03/27/18 0742  BNP 400.0*    DDimer No results for input(s): DDIMER in the last 168 hours.  Radiology/Studies:  Dg Chest 2 View  Result Date: 03/27/2018 CLINICAL DATA:  67 year old presenting with intermittent generalized weakness, particularly the lower extremities, with associated presyncope. EXAM: CHEST - 2 VIEW COMPARISON:  12/29/2013, 06/15/2013. FINDINGS: Cardiac silhouette moderately enlarged, increased in size since 2015. Thoracic aorta mildly atherosclerotic, unchanged. Hilar and mediastinal contours otherwise unremarkable. Mild to moderate diffuse interstitial pulmonary edema. No confluent airspace consolidation. Small BILATERAL pleural effusions best visualized in the POSTERIOR costophrenic sulci on the LATERAL view.  Degenerative changes involving the thoracic and upper lumbar spine. IMPRESSION: 1. Mild CHF, with moderate cardiomegaly and mild diffuse interstitial pulmonary edema. 2. Small BILATERAL pleural effusions. Electronically Signed   By: Evangeline Dakin M.D.   On: 03/27/2018 08:21    Assessment and Plan:   1. Left atrial mass: I personally reviewed the echocardiogram.  The left atrial mass is quite large-4 x 5 cm and is consistent with an atrial myxoma.  It is grown to the size where it is almost occluding the mitral valve inflow.  She will need to have this removed fairly soon.  She has a long history of cigarette smoking.  I think that she needs a heart catheterization prior to her left atrial myxoma surgery. Discussed the risks, benefits, and options concerning heart catheterization.  She understands  and agrees to proceed.  Put on the schedule for tomorrow.  Have advised smoking cessation.  For questions or updates, please contact [place] Please consult [email or web address] for contact info under Cardiology/STEMI.   Signed, Mertie Moores, MD  03/28/2018 12:50 PM

## 2018-03-29 ENCOUNTER — Inpatient Hospital Stay (HOSPITAL_COMMUNITY)
Admission: EM | Disposition: A | Discharge status: Home / Self Care | Payer: Self-pay | Source: Home / Self Care | Attending: Thoracic Surgery (Cardiothoracic Vascular Surgery)

## 2018-03-29 DIAGNOSIS — J449 Chronic obstructive pulmonary disease, unspecified: Secondary | ICD-10-CM

## 2018-03-29 DIAGNOSIS — J4489 Other specified chronic obstructive pulmonary disease: Secondary | ICD-10-CM

## 2018-03-29 HISTORY — PX: LEFT HEART CATH AND CORONARY ANGIOGRAPHY: CATH118249

## 2018-03-29 LAB — BASIC METABOLIC PANEL
Anion gap: 9 (ref 5–15)
BUN: 6 mg/dL (ref 6–20)
CO2: 27 mmol/L (ref 22–32)
Calcium: 8.9 mg/dL (ref 8.9–10.3)
Chloride: 104 mmol/L (ref 101–111)
Creatinine, Ser: 0.64 mg/dL (ref 0.44–1.00)
GFR calc Af Amer: >60 mL/min (ref >60)
GFR calc non Af Amer: >60 mL/min (ref >60)
GLUCOSE: 87 mg/dL (ref 65–99)
POTASSIUM: 3.8 mmol/L (ref 3.5–5.1)
Sodium: 140 mmol/L (ref 135–145)

## 2018-03-29 SURGERY — LEFT HEART CATH AND CORONARY ANGIOGRAPHY
Anesthesia: LOCAL

## 2018-03-29 MED ORDER — LIDOCAINE HCL 1 % IJ SOLN
INTRAMUSCULAR | Status: AC
  Filled 2018-03-29: qty 20

## 2018-03-29 MED ORDER — ONDANSETRON HCL 4 MG/2ML IJ SOLN: 4.0000 mg | Freq: Four times a day (QID) | INTRAMUSCULAR | Status: DC | PRN

## 2018-03-29 MED ORDER — SODIUM CHLORIDE 0.9 % IV SOLN
INTRAVENOUS | Status: AC | PRN
  Administered 2018-03-29: 10 mL/h via INTRAVENOUS

## 2018-03-29 MED ORDER — SODIUM CHLORIDE 0.9 % WEIGHT BASED INFUSION: 1.0000 mL/kg/h | INTRAVENOUS | Status: DC

## 2018-03-29 MED ORDER — MORPHINE SULFATE (PF) 2 MG/ML IV SOLN: 2.0000 mg | INTRAVENOUS | Status: DC | PRN

## 2018-03-29 MED ORDER — SODIUM CHLORIDE 0.9% FLUSH: 3.0000 mL | INTRAVENOUS | Status: DC | PRN

## 2018-03-29 MED ORDER — SODIUM CHLORIDE 0.9% FLUSH: 3.0000 mL | Freq: Two times a day (BID) | INTRAVENOUS | Status: DC

## 2018-03-29 MED ORDER — IOHEXOL 350 MG/ML SOLN
INTRAVENOUS | Status: DC | PRN
  Administered 2018-03-29: 55 mL via INTRACARDIAC

## 2018-03-29 MED ORDER — FENTANYL CITRATE (PF) 100 MCG/2ML IJ SOLN
INTRAMUSCULAR | Status: DC | PRN
  Administered 2018-03-29: 25 ug via INTRAVENOUS

## 2018-03-29 MED ORDER — HEPARIN SODIUM (PORCINE) 1000 UNIT/ML IJ SOLN
INTRAMUSCULAR | Status: AC
  Filled 2018-03-29: qty 1

## 2018-03-29 MED ORDER — HEPARIN (PORCINE) IN NACL 2-0.9 UNIT/ML-% IJ SOLN
INTRAMUSCULAR | Status: AC
  Filled 2018-03-29: qty 1000

## 2018-03-29 MED ORDER — ACETAMINOPHEN 325 MG PO TABS
650.0000 mg | ORAL_TABLET | ORAL | Status: DC | PRN
  Administered 2018-03-29 – 2018-03-30 (×2): 650 mg via ORAL
  Filled 2018-03-29 (×2): qty 2

## 2018-03-29 MED ORDER — MIDAZOLAM HCL 2 MG/2ML IJ SOLN
INTRAMUSCULAR | Status: AC
  Filled 2018-03-29: qty 2

## 2018-03-29 MED ORDER — HEPARIN SODIUM (PORCINE) 1000 UNIT/ML IJ SOLN
INTRAMUSCULAR | Status: DC | PRN
  Administered 2018-03-29: 3000 [IU] via INTRAVENOUS

## 2018-03-29 MED ORDER — SODIUM CHLORIDE 0.9 % WEIGHT BASED INFUSION: 3.0000 mL/kg/h | INTRAVENOUS | Status: DC

## 2018-03-29 MED ORDER — SODIUM CHLORIDE 0.9 % IV SOLN
INTRAVENOUS | Status: AC
  Administered 2018-03-29: 14:00:00 via INTRAVENOUS

## 2018-03-29 MED ORDER — FENTANYL CITRATE (PF) 100 MCG/2ML IJ SOLN
INTRAMUSCULAR | Status: AC
  Filled 2018-03-29: qty 2

## 2018-03-29 MED ORDER — LIDOCAINE HCL (PF) 1 % IJ SOLN
INTRAMUSCULAR | Status: DC | PRN
  Administered 2018-03-29: 2 mL

## 2018-03-29 MED ORDER — VERAPAMIL HCL 2.5 MG/ML IV SOLN
INTRA_ARTERIAL | Status: DC | PRN
  Administered 2018-03-29: 3 mL via INTRA_ARTERIAL

## 2018-03-29 MED ORDER — HEPARIN (PORCINE) IN NACL 2-0.9 UNIT/ML-% IJ SOLN
INTRAMUSCULAR | Status: AC | PRN
  Administered 2018-03-29 (×2): 500 mL

## 2018-03-29 MED ORDER — SODIUM CHLORIDE 0.9 % IV SOLN: 250.0000 mL | INTRAVENOUS | Status: DC | PRN

## 2018-03-29 MED ORDER — SODIUM CHLORIDE 0.9% FLUSH
3.0000 mL | Freq: Two times a day (BID) | INTRAVENOUS | Status: DC
  Administered 2018-03-30 – 2018-03-31 (×3): 3 mL via INTRAVENOUS

## 2018-03-29 MED ORDER — NITROGLYCERIN 1 MG/10 ML FOR IR/CATH LAB
INTRA_ARTERIAL | Status: AC
  Filled 2018-03-29: qty 10

## 2018-03-29 MED ORDER — VERAPAMIL HCL 2.5 MG/ML IV SOLN
INTRAVENOUS | Status: AC
  Filled 2018-03-29: qty 2

## 2018-03-29 MED ORDER — MIDAZOLAM HCL 2 MG/2ML IJ SOLN
INTRAMUSCULAR | Status: DC | PRN
  Administered 2018-03-29: 1 mg via INTRAVENOUS

## 2018-03-29 SURGICAL SUPPLY — 11 items
BAND CMPR LRG ZPHR (HEMOSTASIS) ×1
BAND ZEPHYR COMPRESS 30 LONG (HEMOSTASIS) ×1 IMPLANT
CATH OPTITORQUE TIG 4.0 5F (CATHETERS) ×1 IMPLANT
GLIDESHEATH SLEND A-KIT 6F 22G (SHEATH) ×1 IMPLANT
GUIDEWIRE INQWIRE 1.5J.035X260 (WIRE) IMPLANT
INQWIRE 1.5J .035X260CM (WIRE) ×2
KIT HEART LEFT (KITS) ×2 IMPLANT
PACK CARDIAC CATHETERIZATION (CUSTOM PROCEDURE TRAY) ×2 IMPLANT
TRANSDUCER W/STOPCOCK (MISCELLANEOUS) ×2 IMPLANT
TUBING CIL FLEX 10 FLL-RA (TUBING) ×2 IMPLANT
WIRE HI TORQ VERSACORE-J 145CM (WIRE) ×1 IMPLANT

## 2018-03-29 NOTE — Interval H&P Note (Signed)
Cath Lab Visit (complete for each Cath Lab visit)  Clinical Evaluation Leading to the Procedure:   ACS: No.  Non-ACS:    Anginal Classification: CCS I  Anti-ischemic medical therapy: No Therapy  Non-Invasive Test Results: No non-invasive testing performed  Prior CABG: No previous CABG      History and Physical Interval Note:  03/29/2018 1:25 PM  Jacqueline [name]  has presented today for surgery, with the diagnosis of mxyoma  The various methods of treatment have been discussed with the patient and family. After consideration of risks, benefits and other options for treatment, the patient has consented to  Procedure(s): LEFT HEART CATH AND CORONARY ANGIOGRAPHY (N/A) as a surgical intervention .  The patient's history has been reviewed, patient examined, no change in status, stable for surgery.  I have reviewed the patient's chart and labs.  Questions were answered to the patient's satisfaction.     Quay Burow

## 2018-03-29 NOTE — Progress Notes (Signed)
Pt to cath lab at this time

## 2018-03-29 NOTE — Progress Notes (Signed)
PROGRESS NOTE    Jacqueline [name]   SEG:[id number]  DOB: 06-29-1951  DOA: 03/27/2018 PCP: Neale Burly, MD   Brief Narrative:  Jacqueline [name] is a 67 year old female cigarette-smoker, 1/2 pack daily, peripheral neuropathy, chronic constipation (last BM was on 4/2), GERD and chronic pain in neck and all of her back for which she is on Suboxone by pain management, Dr Mirna Mires.   Her medical record review shows that she has been known to use cocaine, opiates and marijuana. The patient states she still smokes marijuana regularly, but does not use any other illicit drugs  (clean for over 2 years).  Since this summer she has been having shortness of breath and dizziness on exertion. Symptoms were severe over the past couple of days prior to admission and she could barely walk without dizziness and dyspnea.  ED:  CXR > moderately enlarged cardiac silhouette, mild CHF with mild diffuse interstitial edema, and small bilateral pleural effusions. BNP at 400 and negative troponin I UDS + for marijuana Admitted for ACS rule out.   2 d ECHO> Normal LV function; elevated LV filling pressure; large mass in   left atrium (probable myxoma) obstructing MV inflow (mean   gradient 8 mmHg) and mild MR; severe LAE; trace TR with mildly   elevated pulmonary pressure  Transferred from [facility] to [facility] for CT surgery eval.   Subjective: No new complaints today. Has a cough and brings up a (lot of tan sputums) which she states she has had for 1 yr now. I have explained to her that this is chronic bronchitis, she needs to stop smoking and have her PCP order PFTs as outpt.   Assessment & Plan:   Principal Problem: Dyspnea on exertion with lightheaded sensation - could have many reasons for this including: CAD, COPD and atrial mass - pulse ox 100 % on room air but has crackles in b/l bases - appreciate cardiology consult>  cath today - CT surgery has evaluated and plans on surgery on Wed or Thursday  Chronic  bronchitis Smoker -cough with tan sputum for 1 yr - smoking for 20 years - states Nicotine patch give her a headache and she has declined it- have d/c'd the order - advised to stop smoking - will need outpt PFTs and will need to quit smoking  HTN - Lisinopril and Coreg started on admission  Hypokalemia- 3.4 yesterday - replaced   Chronic neck and back pain/ Arthritis -  Con Suboxone, Voltaren gel, Neutontin and Flexeril which she takes at home  GERD - takes Prilosec at home- cont Protonix- avoid NSAIDS  Depression - Prozac  H/o drug abuse - stopped Cocaine - continues to use Marijuana UDS +  Chronic constipation/ on narcotics - cont Miralax PRN and Colace BID    DVT prophylaxis: Lovenox Code Status: Full code Family Communication:   Disposition Plan: f/u on cath and CT surg eval Consultants:   Cardiology  CT surgery Procedures:   2 D ECHO Antimicrobials:  Anti-infectives (From admission, onward)   None       Objective: Vitals:   03/29/18 0421 03/29/18 0839 03/29/18 0840 03/29/18 1016  BP:  (!) 155/95 (!) 155/95 (!) 146/98  Pulse:   (!) 57   Resp:      Temp: 98.8 F (37.1 C) 98.3 F (36.8 C)    TempSrc: Oral Oral    SpO2:  97%    Weight:      Height:  Intake/Output Summary (Last 24 hours) at 03/29/2018 1127 Last data filed at 03/29/2018 0730 Gross per 24 hour  Intake 1080 ml  Output 1700 ml  Net -620 ml   Filed Weights   03/27/18 0710 03/27/18 1827  Weight: 56.7 kg (125 lb) 53.9 kg (118 lb 12.8 oz)    Examination: General exam: Appears comfortable  HEENT: PERRLA, oral mucosa moist, no sclera icterus or thrush Respiratory system: Clear to auscultation. Respiratory effort normal. Pulse ox 100% on room air Cardiovascular system: S1 & S2 heard, RRR.   Gastrointestinal system: Abdomen soft, non-tender, nondistended. Normal bowel sound. No organomegaly Central nervous system: Alert and oriented. No focal neurological  deficits. Extremities: No cyanosis, clubbing or edema Skin: No rashes or ulcers Psychiatry:  Mood & affect appropriate.     Data Reviewed: I have personally reviewed following labs and imaging studies  CBC: Recent Labs  Lab 03/27/18 0742  WBC 5.1  HGB 10.9*  HCT 34.6*  MCV 94.5  PLT 338   Basic Metabolic Panel: Recent Labs  Lab 03/27/18 0742 03/29/18 0239  NA 137 140  K 3.4* 3.8  CL 98* 104  CO2 29 27  GLUCOSE 93 87  BUN 8 6  CREATININE 0.81 0.64  CALCIUM 8.8* 8.9   GFR: Estimated Creatinine Clearance: 56.2 mL/min (by C-G formula based on SCr of 0.64 mg/dL). Liver Function Tests: Recent Labs  Lab 03/27/18 0742  AST 17  ALT 9*  ALKPHOS 74  BILITOT 0.4  PROT 6.9  ALBUMIN 3.8   No results for input(s): LIPASE, AMYLASE in the last 168 hours. No results for input(s): AMMONIA in the last 168 hours. Coagulation Profile: Recent Labs  Lab 03/27/18 0742  INR 1.02   Cardiac Enzymes: Recent Labs  Lab 03/27/18 0742 03/27/18 1532  TROPONINI <0.03 <0.03   BNP (last 3 results) No results for input(s): PROBNP in the last 8760 hours. HbA1C: Recent Labs    03/27/18 0742  HGBA1C 5.5   CBG: Recent Labs  Lab 03/27/18 0732  GLUCAP 100*   Lipid Profile: No results for input(s): CHOL, HDL, LDLCALC, TRIG, CHOLHDL, LDLDIRECT in the last 72 hours. Thyroid Function Tests: Recent Labs    03/27/18 0742  TSH 1.694   Anemia Panel: No results for input(s): VITAMINB12, FOLATE, FERRITIN, TIBC, IRON, RETICCTPCT in the last 72 hours. Urine analysis:    Component Value Date/Time   COLORURINE YELLOW 05/02/2012 1919   APPEARANCEUR CLEAR 05/02/2012 1919   LABSPEC 1.010 05/02/2012 1919   PHURINE 7.5 05/02/2012 1919   GLUCOSEU NEGATIVE 05/02/2012 1919   HGBUR TRACE (A) 05/02/2012 1919   BILIRUBINUR NEGATIVE 05/02/2012 1919   KETONESUR NEGATIVE 05/02/2012 1919   PROTEINUR NEGATIVE 05/02/2012 1919   UROBILINOGEN 0.2 05/02/2012 1919   NITRITE NEGATIVE 05/02/2012  1919   LEUKOCYTESUR NEGATIVE 05/02/2012 1919   Sepsis Labs: @LABRCNTIP (procalcitonin:4,lacticidven:4) )No results found for this or any previous visit (from the past 240 hour(s)).       Radiology Studies: No results found.    Scheduled Meds: . aspirin EC  81 mg Oral Daily  . buprenorphine-naloxone  1 tablet Sublingual TID  . carvedilol  6.25 mg Oral BID WC  . docusate sodium  100 mg Oral BID  . enoxaparin (LOVENOX) injection  40 mg Subcutaneous Q24H  . FLUoxetine  40 mg Oral Daily  . gabapentin  600 mg Oral TID  . lisinopril  5 mg Oral Daily  . loratadine  10 mg Oral Daily  . pantoprazole  40 mg Oral  Daily   Continuous Infusions:   LOS: 1 day    Time spent in minutes: [place], MD [facility] Pager: [email or web address] Password [facility] 03/29/2018, 11:27 AM

## 2018-03-29 NOTE — Progress Notes (Addendum)
Progress Note  Patient Name: Jacqueline [name] Date of Encounter: 03/29/2018  Primary Cardiologist:  Mertie Moores, MD  Subjective   No CP, no SOB, no questions about cath, says will not smoke again  Inpatient Medications    Scheduled Meds: . aspirin EC  81 mg Oral Daily  . buprenorphine-naloxone  1 tablet Sublingual TID  . carvedilol  6.25 mg Oral BID WC  . docusate sodium  100 mg Oral BID  . enoxaparin (LOVENOX) injection  40 mg Subcutaneous Q24H  . FLUoxetine  40 mg Oral Daily  . gabapentin  600 mg Oral TID  . lisinopril  5 mg Oral Daily  . loratadine  10 mg Oral Daily  . pantoprazole  40 mg Oral Daily   Continuous Infusions:  PRN Meds: acetaminophen, cyclobenzaprine, nitroGLYCERIN, ondansetron (ZOFRAN) IV, polyethylene glycol   Vital Signs    Vitals:   03/28/18 1928 03/29/18 0421 03/29/18 0839 03/29/18 0840  BP: 136/82  (!) 155/95 (!) 155/95  Pulse:    (!) 57  Resp:      Temp: 99.1 F (37.3 C) 98.8 F (37.1 C) 98.3 F (36.8 C)   TempSrc: Oral Oral Oral   SpO2:   97%   Weight:      Height:        Intake/Output Summary (Last 24 hours) at 03/29/2018 8119 Last data filed at 03/29/2018 0730 Gross per 24 hour  Intake 1080 ml  Output 1700 ml  Net -620 ml   Filed Weights   03/27/18 0710 03/27/18 1827  Weight: 125 lb (56.7 kg) 118 lb 12.8 oz (53.9 kg)    Telemetry    SR, Sinus brady - Personally Reviewed  ECG    04/07, SR, no acute changes - Personally Reviewed  Physical Exam   General: Well developed, well nourished, female appearing in no acute distress. Head: Normocephalic, atraumatic.  Neck: Supple without bruits, no JVD but strong carotid pulsations seen. Lungs:  Resp regular and unlabored, few scattered rales. Heart: RRR, S1, S2, no S3, S4, +diast murmur; no rub. Abdomen: Soft, non-tender, non-distended with normoactive bowel sounds. No hepatomegaly. No rebound/guarding. No obvious abdominal masses. Extremities: No clubbing, cyanosis, no edema.  Distal pedal pulses are 2+ bilaterally. Neuro: Alert and oriented X 3. Moves all extremities spontaneously. Psych: Normal affect.  Labs    Hematology Recent Labs  Lab 03/27/18 0742  WBC 5.1  RBC 3.66*  HGB 10.9*  HCT 34.6*  MCV 94.5  MCH 29.8  MCHC 31.5  RDW 14.1  PLT 239    Chemistry Recent Labs  Lab 03/27/18 0742 03/29/18 0239  NA 137 140  K 3.4* 3.8  CL 98* 104  CO2 29 27  GLUCOSE 93 87  BUN 8 6  CREATININE 0.81 0.64  CALCIUM 8.8* 8.9  PROT 6.9  --   ALBUMIN 3.8  --   AST 17  --   ALT 9*  --   ALKPHOS 74  --   BILITOT 0.4  --   GFRNONAA >60 >60  GFRAA >60 >60  ANIONGAP 10 9     Cardiac Enzymes Recent Labs  Lab 03/27/18 0742 03/27/18 1532  TROPONINI <0.03 <0.03      BNP Recent Labs  Lab 03/27/18 0742  BNP 400.0*     Radiology    Dg Chest 2 View  Result Date: 03/27/2018 CLINICAL DATA:  67 year old presenting with intermittent generalized weakness, particularly the lower extremities, with associated presyncope. EXAM: CHEST - 2 VIEW COMPARISON:  12/29/2013,  06/15/2013. FINDINGS: Cardiac silhouette moderately enlarged, increased in size since 2015. Thoracic aorta mildly atherosclerotic, unchanged. Hilar and mediastinal contours otherwise unremarkable. Mild to moderate diffuse interstitial pulmonary edema. No confluent airspace consolidation. Small BILATERAL pleural effusions best visualized in the POSTERIOR costophrenic sulci on the LATERAL view. Degenerative changes involving the thoracic and upper lumbar spine. IMPRESSION: 1. Mild CHF, with moderate cardiomegaly and mild diffuse interstitial pulmonary edema. 2. Small BILATERAL pleural effusions. Electronically Signed   By: Evangeline Dakin M.D.   On: 03/27/2018 08:21     Cardiac Studies   ECHO:  03/27/2018 - Left ventricle: The cavity size was normal. Wall thickness was   normal. Systolic function was normal. The estimated ejection   fraction was in the range of 55% to 60%. Wall motion was  normal;   there were no regional wall motion abnormalities. Doppler   parameters are consistent with high ventricular filling pressure. - Mitral valve: There was mild regurgitation. - Left atrium: The atrium was severely dilated. - Pulmonary arteries: Systolic pressure was mildly increased. Impressions: - Normal LV function; elevated LV filling pressure; large mass in   left atrium (probable myxoma) obstructing MV inflow (mean   gradient 8 mmHg) and mild MR; severe LAE; trace TR with mildly   elevated pulmonary pressure; results called to Dr Dyann Kief.   Patient Profile     67 y.o. female w/ hx polysubs abuse, IBS, GERD, gastric tumor 1992, tob use was admitted 04/06 w/ SOB>>atrial mass on echo  Assessment & Plan     Active Problems: 1.  Atrial mass - Dr Roxan Hockey has seen>>surgery needed - for cath today to eval for CAD - surgery possibly Weds/Thurs - UDS + only for [facility] - if cath + for CAD, will need to ck lipids and add statin - continue ASA, BB, ACE   Otherwise, per IM   OSTEOARTHRITIS, LOWER LEG   Major depressive disorder, recurrent, severe without psychotic features (HCC)   PUD (peptic ulcer disease)   GERD (gastroesophageal reflux disease)   Constipation   Chronic pain syndrome   Elevated brain natriuretic peptide (BNP) level   Tobacco abuse   Hypokalemia   HTN (hypertension)   Dyspnea on exertion   COPD with chronic bronchitis ([place])    Signed, Rosaria Ferries , PA-C 9:37 AM 03/29/2018 Pager: [phone]  Attending Note:   The patient was seen and examined.  Agree with assessment and plan as noted above.  Changes made to the above note as needed.  Patient seen and independently examined with Rosaria Ferries, PA .   We discussed all aspects of the encounter. I agree with the assessment and plan as stated above.  1.   Left atrial myxoma:   Plans are for removal of the myxoma this week.  Cath today revealed a moderate LAD stenosis. Would not anticipate  that she would need bypass of that. She appears stable  + soft diastolic murmur  Lungs are clear    I have spent a total of 40 minutes with patient reviewing hospital  notes , telemetry, EKGs, labs and examining patient as well as establishing an assessment and plan that was discussed with the patient. > 50% of time was spent in direct patient care.    Thayer Headings, Brooke Bonito., MD, [facility] 03/29/2018, 8:58 PM [address],  [place] Pager [phone]

## 2018-03-29 NOTE — Progress Notes (Signed)
Right wrist TR band removed. Site level 0. Gauze and tegaderm dressing applied. Pt instructed on continued restrictions. Pt verbalized understanding.  Ara Kussmaul BSN, RN

## 2018-03-29 NOTE — Progress Notes (Signed)
      [address]       [place] [address]             [phone]      Resting comfortably No CP or SOB at rest BP (!) 149/86 (BP Location: Left Arm)   Pulse (!) 56   Temp 98.3 F (36.8 C) (Oral)   Resp 13   Ht 5\' 2"  (1.575 m)   Wt 118 lb 12.8 oz (53.9 kg)   SpO2 94%   BMI 21.73 kg/m  Cath today OR later this week for resection of atrial myxoma  Remo Lipps C. Roxan Hockey, MD [facility] [phone]

## 2018-03-30 ENCOUNTER — Encounter (HOSPITAL_COMMUNITY): Payer: Self-pay | Admitting: Cardiovascular Disease

## 2018-03-30 ENCOUNTER — Other Ambulatory Visit: Payer: Self-pay | Admitting: *Deleted

## 2018-03-30 ENCOUNTER — Inpatient Hospital Stay (HOSPITAL_COMMUNITY): Payer: Medicaid Other

## 2018-03-30 DIAGNOSIS — D219 Benign neoplasm of connective and other soft tissue, unspecified: Secondary | ICD-10-CM

## 2018-03-30 LAB — PULMONARY FUNCTION TEST
DL/VA % PRED: 82 %
DL/VA: 3.73 ml/min/mmHg/L
DLCO COR % PRED: 65 %
DLCO COR: 14.18 ml/min/mmHg
DLCO unc % pred: 61 %
DLCO unc: 13.33 ml/min/mmHg
FEF 25-75 Post: 2.36 L/sec
FEF 25-75 Pre: 1.64 L/sec
FEF2575-%CHANGE-POST: 43 %
FEF2575-%Pred-Post: 115 %
FEF2575-%Pred-Pre: 80 %
FEV1-%Change-Post: 7 %
FEV1-%PRED-PRE: 76 %
FEV1-%Pred-Post: 82 %
FEV1-POST: 1.85 L
FEV1-Pre: 1.73 L
FEV1FVC-%Change-Post: 3 %
FEV1FVC-%PRED-PRE: 102 %
FEV6-%Change-Post: 3 %
FEV6-%PRED-POST: 80 %
FEV6-%Pred-Pre: 77 %
FEV6-POST: 2.27 L
FEV6-Pre: 2.19 L
FEV6FVC-%PRED-POST: 104 %
FEV6FVC-%Pred-Pre: 104 %
FVC-%CHANGE-POST: 3 %
FVC-%PRED-POST: 77 %
FVC-%Pred-Pre: 74 %
FVC-POST: 2.27 L
FVC-Pre: 2.19 L
PRE FEV1/FVC RATIO: 79 %
PRE FEV6/FVC RATIO: 100 %
Post FEV1/FVC ratio: 82 %
Post FEV6/FVC ratio: 100 %
RV % pred: 95 %
RV: 1.9 L
TLC % PRED: 86 %
TLC: 4.1 L

## 2018-03-30 LAB — CBC
HEMATOCRIT: 37.1 % (ref 36.0–46.0)
HEMOGLOBIN: 11.6 g/dL — AB (ref 12.0–15.0)
MCH: 28.7 pg (ref 26.0–34.0)
MCHC: 31.3 g/dL (ref 30.0–36.0)
MCV: 91.8 fL (ref 78.0–100.0)
Platelets: 246 10*3/uL (ref 150–400)
RBC: 4.04 MIL/uL (ref 3.87–5.11)
RDW: 14.1 % (ref 11.5–15.5)
WBC: 5.6 10*3/uL (ref 4.0–10.5)

## 2018-03-30 LAB — BASIC METABOLIC PANEL
Anion gap: 9 (ref 5–15)
BUN: 7 mg/dL (ref 6–20)
CHLORIDE: 103 mmol/L (ref 101–111)
CO2: 26 mmol/L (ref 22–32)
CREATININE: 0.62 mg/dL (ref 0.44–1.00)
Calcium: 9 mg/dL (ref 8.9–10.3)
GFR calc Af Amer: >60 mL/min (ref >60)
GFR calc non Af Amer: >60 mL/min (ref >60)
Glucose, Bld: 91 mg/dL (ref 65–99)
Potassium: 3.7 mmol/L (ref 3.5–5.1)
Sodium: 138 mmol/L (ref 135–145)

## 2018-03-30 MED ORDER — BISACODYL 10 MG RE SUPP
10.0000 mg | Freq: Every day | RECTAL | Status: DC | PRN
  Administered 2018-03-30: 10 mg via RECTAL
  Filled 2018-03-30: qty 1

## 2018-03-30 MED ORDER — ATORVASTATIN CALCIUM 40 MG PO TABS
40.0000 mg | ORAL_TABLET | Freq: Every day | ORAL | Status: DC
  Administered 2018-03-30 – 2018-03-31 (×2): 40 mg via ORAL
  Filled 2018-03-30 (×2): qty 1

## 2018-03-30 MED ORDER — ALBUTEROL SULFATE (2.5 MG/3ML) 0.083% IN NEBU
2.5000 mg | INHALATION_SOLUTION | Freq: Once | RESPIRATORY_TRACT | Status: AC
  Administered 2018-03-30: 2.5 mg via RESPIRATORY_TRACT

## 2018-03-30 MED FILL — Heparin Sodium (Porcine) 2 Unit/ML in Sodium Chloride 0.9%: INTRAMUSCULAR | Qty: 1000 | Status: AC

## 2018-03-30 MED FILL — Lidocaine HCl Local Inj 1%: INTRAMUSCULAR | Qty: 20 | Status: AC

## 2018-03-30 NOTE — Progress Notes (Addendum)
Progress Note  Patient Name: Jacqueline [name] Date of Encounter: 03/30/2018  Primary Cardiologist:  Mertie Moores, MD  Subjective   No CP, no SOB, ready to get the surgery over with.  Inpatient Medications    Scheduled Meds: . aspirin EC  81 mg Oral Daily  . buprenorphine-naloxone  1 tablet Sublingual TID  . carvedilol  6.25 mg Oral BID WC  . docusate sodium  100 mg Oral BID  . enoxaparin (LOVENOX) injection  40 mg Subcutaneous Q24H  . FLUoxetine  40 mg Oral Daily  . gabapentin  600 mg Oral TID  . lisinopril  5 mg Oral Daily  . loratadine  10 mg Oral Daily  . pantoprazole  40 mg Oral Daily  . sodium chloride flush  3 mL Intravenous Q12H   Continuous Infusions: . sodium chloride     PRN Meds: sodium chloride, acetaminophen, bisacodyl, cyclobenzaprine, morphine injection, nitroGLYCERIN, ondansetron (ZOFRAN) IV, polyethylene glycol, sodium chloride flush   Vital Signs    Vitals:   03/29/18 1656 03/29/18 1700 03/29/18 2006 03/30/18 0340  BP: 137/73 (!) 145/83 131/75   Pulse: (!) 59 60 (!) 51 63  Resp:  (!) 26 (!) 23 13  Temp:  98.3 F (36.8 C) 98.5 F (36.9 C) 98.4 F (36.9 C)  TempSrc:  Oral Oral Oral  SpO2:  97% 98% 97%  Weight:      Height:        Intake/Output Summary (Last 24 hours) at 03/30/2018 0901 Last data filed at 03/30/2018 0340 Gross per 24 hour  Intake 555 ml  Output 800 ml  Net -245 ml   Filed Weights   03/27/18 0710 03/27/18 1827  Weight: 125 lb (56.7 kg) 118 lb 12.8 oz (53.9 kg)    Telemetry    SR, Sinus brady 50s - Personally Reviewed  ECG    04/07, SR, no acute changes - Personally Reviewed  Physical Exam   General: Well developed, well nourished, female appearing in no acute distress. Head: Normocephalic, atraumatic.  Neck: Supple without bruits, mild JVD Lungs:  Resp regular and unlabored, few scattered rales. Heart: RRR, S1, S2, no S3, S4, +diast murmur; no rub. Abdomen: Soft, non-tender, non-distended with normoactive bowel  sounds. No hepatomegaly. No rebound/guarding. No obvious abdominal masses. Extremities: No clubbing, cyanosis, no edema. Distal pedal pulses are 2+ bilaterally. R radial pulse 2+, cath dressing in place Neuro: Alert and oriented X 3. Moves all extremities spontaneously. Psych: Normal affect.  Labs    Hematology Recent Labs  Lab 03/27/18 0742 03/30/18 0401  WBC 5.1 5.6  RBC 3.66* 4.04  HGB 10.9* 11.6*  HCT 34.6* 37.1  MCV 94.5 91.8  MCH 29.8 28.7  MCHC 31.5 31.3  RDW 14.1 14.1  PLT 239 246    Chemistry Recent Labs  Lab 03/27/18 0742 03/29/18 0239 03/30/18 0401  NA 137 140 138  K 3.4* 3.8 3.7  CL 98* 104 103  CO2 29 27 26   GLUCOSE 93 87 91  BUN 8 6 7   CREATININE 0.81 0.64 0.62  CALCIUM 8.8* 8.9 9.0  PROT 6.9  --   --   ALBUMIN 3.8  --   --   AST 17  --   --   ALT 9*  --   --   ALKPHOS 74  --   --   BILITOT 0.4  --   --   GFRNONAA >60 >60 >60  GFRAA >60 >60 >60  ANIONGAP 10 9 9  Cardiac Enzymes Recent Labs  Lab 03/27/18 0742 03/27/18 1532  TROPONINI <0.03 <0.03      BNP Recent Labs  Lab 03/27/18 0742  BNP 400.0*     Radiology    Dg Chest 2 View  Result Date: 03/27/2018 CLINICAL DATA:  67 year old presenting with intermittent generalized weakness, particularly the lower extremities, with associated presyncope. EXAM: CHEST - 2 VIEW COMPARISON:  12/29/2013, 06/15/2013. FINDINGS: Cardiac silhouette moderately enlarged, increased in size since 2015. Thoracic aorta mildly atherosclerotic, unchanged. Hilar and mediastinal contours otherwise unremarkable. Mild to moderate diffuse interstitial pulmonary edema. No confluent airspace consolidation. Small BILATERAL pleural effusions best visualized in the POSTERIOR costophrenic sulci on the LATERAL view. Degenerative changes involving the thoracic and upper lumbar spine. IMPRESSION: 1. Mild CHF, with moderate cardiomegaly and mild diffuse interstitial pulmonary edema. 2. Small BILATERAL pleural effusions.  Electronically Signed   By: Evangeline Dakin M.D.   On: 03/27/2018 08:21     Cardiac Studies   ECHO:  03/27/2018 - Left ventricle: The cavity size was normal. Wall thickness was   normal. Systolic function was normal. The estimated ejection   fraction was in the range of 55% to 60%. Wall motion was normal;   there were no regional wall motion abnormalities. Doppler   parameters are consistent with high ventricular filling pressure. - Mitral valve: There was mild regurgitation. - Left atrium: The atrium was severely dilated. - Pulmonary arteries: Systolic pressure was mildly increased. Impressions: - Normal LV function; elevated LV filling pressure; large mass in   left atrium (probable myxoma) obstructing MV inflow (mean   gradient 8 mmHg) and mild MR; severe LAE; trace TR with mildly   elevated pulmonary pressure; results called to Dr Dyann Kief.  CATH: 03/29/2018 Diagnostic Diagram        Patient Profile     67 y.o. female w/ hx polysubs abuse, IBS, GERD, gastric tumor 1992, tob use was admitted 04/06 w/ SOB>>atrial mass on echo  Assessment & Plan     Active Problems: 1.  Atrial mass - Dr Roxan Hockey has seen>>surgery needed - cath without obstructive CAD - surgery possibly Weds/Thurs - UDS + only for [facility] - since cath + for CAD, although non-obstructive, will ck lipids and add statin - continue ASA, BB, ACE   Otherwise, per IM   OSTEOARTHRITIS, LOWER LEG   Major depressive disorder, recurrent, severe without psychotic features ([place])   PUD (peptic ulcer disease)   GERD (gastroesophageal reflux disease)   Constipation   Chronic pain syndrome   Elevated brain natriuretic peptide (BNP) level   Tobacco abuse   Hypokalemia   HTN (hypertension)   Dyspnea on exertion   COPD with chronic bronchitis ([place])    Signed, Rosaria Ferries , PA-C 9:01 AM 03/30/2018 Pager: [phone]  Attending Note:   The patient was seen and examined.  Agree with assessment and plan  as noted above.  Changes made to the above note as needed.  Patient seen and independently examined with Rosaria Ferries, PA .   We discussed all aspects of the encounter. I agree with the assessment and plan as stated above.  1.   Left atrial myxoma:   He developed a little lightheadedness while walking back from the bathroom.  This might be due to impaired mitral inflow due to the atrial myxoma.  Plan is for surgery on Thursday.  Minimal CAD by cath.      I have spent a total of 40 minutes with patient reviewing hospital  notes ,  telemetry, EKGs, labs and examining patient as well as establishing an assessment and plan that was discussed with the patient. > 50% of time was spent in direct patient care.    Thayer Headings, Brooke Bonito., MD, [facility] 03/30/2018, 9:01 AM [address]  [place] Pager [phone]

## 2018-03-30 NOTE — Progress Notes (Addendum)
PROGRESS NOTE    Jacqueline [name]   ZOX:[id number]  DOB: 1951-02-17  DOA: 03/27/2018 PCP: Neale Burly, MD   Brief Narrative:  Jacqueline [name] is a 67 year old female cigarette-smoker, 1/2 pack daily, peripheral neuropathy, chronic constipation (last BM was on 4/2), GERD and chronic pain in neck and all of her back for which she is on Suboxone by pain management, Dr Mirna Mires.   Her medical record review shows that she has been known to use cocaine, opiates and marijuana. The patient states she still smokes marijuana regularly, but does not use any other illicit drugs  (clean for over 2 years).  Since this summer she has been having shortness of breath and dizziness on exertion. Symptoms were severe over the past couple of days prior to admission and she could barely walk without dizziness and dyspnea.  ED:  CXR > moderately enlarged cardiac silhouette, mild CHF with mild diffuse interstitial edema, and small bilateral pleural effusions. BNP at 400 and negative troponin I UDS + for marijuana Admitted for ACS rule out.   2 d ECHO> Normal LV function; elevated LV filling pressure; large mass in   left atrium (probable myxoma) obstructing MV inflow (mean   gradient 8 mmHg) and mild MR; severe LAE; trace TR with mildly   elevated pulmonary pressure  Transferred from [facility] to [facility] for CT surgery eval.   Subjective: No new complaints today.    Assessment & Plan:   Principal Problem: Dyspnea on exertion with lightheaded sensation - could have many reasons for this including: CAD, COPD and atrial mass - pulse ox 100 % on room air- has crackles in b/l bases - appreciate cardiology consult- Cardiac cath done and shows minimal CAD - CT surgery has evaluated and plans on surgery on Wed or Thursday  Mild non-obstructive CAD- 40% LAD - cath 4/8 - cardiology following- have ordered Fasting lipids and Statin - A1c is 5.5 - also on ASA, Coreg, Lisinopril  Chronic bronchitis Smoker -cough  with tan sputum for 1 yr - smoking for 20 years - states Nicotine patch gives her a headache and she has declined it- have d/c'd the order - advised to stop smoking - will need outpt PFTs    HTN - Lisinopril and Coreg started on admission  Hypokalemia  - replaced   Chronic neck and back pain/ Arthritis -  Con Suboxone, Voltaren gel, Neutontin and Flexeril which she takes at home  GERD - takes Prilosec at home- cont Protonix- avoid NSAIDS  Depression - Prozac  H/o drug abuse - stopped Cocaine - continues to use Marijuana UDS +  Chronic constipation/ on narcotics - cont Miralax PRN and Colace BID - no BM yet - almost 1 wk now- have added a Dulcolax suppository today - will order Enema if no results with suppository   DVT prophylaxis: Lovenox Code Status: Full code Family Communication:   Disposition Plan:  CT surgery tomorrow or Thurs Consultants:   Cardiology  CT surgery Procedures:   2 D ECHO Left ventricle: The cavity size was normal. Wall thickness was normal. Systolic function was normal. The estimated ejection fraction was in the range of 55% to 60%. Wall motion was normal; there were no regional wall motion abnormalities. Doppler parameters are consistent with high ventricular filling pressure. - Mitral valve: There was mild regurgitation. - Left atrium: The atrium was severely dilated. - Pulmonary arteries: Systolic pressure was mildly increased. Impressions: - Normal LV function; elevated LV filling pressure; large  mass in left atrium (probable myxoma) obstructing MV inflow (mean gradient 8 mmHg) and mild MR; severe LAE; trace TR with mildly elevated pulmonary pressure; results called to Dr Dyann Kief.    Antimicrobials:  Anti-infectives (From admission, onward)   None       Objective: Vitals:   03/29/18 1700 03/29/18 2006 03/30/18 0340 03/30/18 1159  BP: (!) 145/83 131/75  113/84  Pulse: 60 (!) 51 63 67  Resp: (!) 26 (!) 23 13    Temp: 98.3 F (36.8 C) 98.5 F (36.9 C) 98.4 F (36.9 C)   TempSrc: Oral Oral Oral   SpO2: 97% 98% 97%   Weight:      Height:        Intake/Output Summary (Last 24 hours) at 03/30/2018 1654 Last data filed at 03/30/2018 0837 Gross per 24 hour  Intake 125 ml  Output 800 ml  Net -675 ml   Filed Weights   03/27/18 0710 03/27/18 1827  Weight: 56.7 kg (125 lb) 53.9 kg (118 lb 12.8 oz)    Examination: General exam: Appears comfortable  HEENT: PERRLA, oral mucosa moist, no sclera icterus or thrush Respiratory system: Clear to auscultation. Respiratory effort normal. Cardiovascular system: S1 & S2 heard,  No murmurs  Gastrointestinal system: Abdomen soft, non-tender, nondistended. Normal bowel sound. No organomegaly Central nervous system: Alert and oriented. No focal neurological deficits. Extremities: No cyanosis, clubbing or edema Skin: No rashes or ulcers Psychiatry:  Mood & affect appropriate.    Data Reviewed: I have personally reviewed following labs and imaging studies  CBC: Recent Labs  Lab 03/27/18 0742 03/30/18 0401  WBC 5.1 5.6  HGB 10.9* 11.6*  HCT 34.6* 37.1  MCV 94.5 91.8  PLT 239 417   Basic Metabolic Panel: Recent Labs  Lab 03/27/18 0742 03/29/18 0239 03/30/18 0401  NA 137 140 138  K 3.4* 3.8 3.7  CL 98* 104 103  CO2 29 27 26   GLUCOSE 93 87 91  BUN 8 6 7   CREATININE 0.81 0.64 0.62  CALCIUM 8.8* 8.9 9.0   GFR: Estimated Creatinine Clearance: 56.2 mL/min (by C-G formula based on SCr of 0.62 mg/dL). Liver Function Tests: Recent Labs  Lab 03/27/18 0742  AST 17  ALT 9*  ALKPHOS 74  BILITOT 0.4  PROT 6.9  ALBUMIN 3.8   No results for input(s): LIPASE, AMYLASE in the last 168 hours. No results for input(s): AMMONIA in the last 168 hours. Coagulation Profile: Recent Labs  Lab 03/27/18 0742  INR 1.02   Cardiac Enzymes: Recent Labs  Lab 03/27/18 0742 03/27/18 1532  TROPONINI <0.03 <0.03   BNP (last 3 results) No results for  input(s): PROBNP in the last 8760 hours. HbA1C: No results for input(s): HGBA1C in the last 72 hours. CBG: Recent Labs  Lab 03/27/18 0732  GLUCAP 100*   Lipid Profile: No results for input(s): CHOL, HDL, LDLCALC, TRIG, CHOLHDL, LDLDIRECT in the last 72 hours. Thyroid Function Tests: No results for input(s): TSH, T4TOTAL, FREET4, T3FREE, THYROIDAB in the last 72 hours. Anemia Panel: No results for input(s): VITAMINB12, FOLATE, FERRITIN, TIBC, IRON, RETICCTPCT in the last 72 hours. Urine analysis:    Component Value Date/Time   COLORURINE YELLOW 05/02/2012 1919   APPEARANCEUR CLEAR 05/02/2012 1919   LABSPEC 1.010 05/02/2012 1919   PHURINE 7.5 05/02/2012 1919   GLUCOSEU NEGATIVE 05/02/2012 1919   HGBUR TRACE (A) 05/02/2012 1919   BILIRUBINUR NEGATIVE 05/02/2012 [place] NEGATIVE 05/02/2012 [place] NEGATIVE 05/02/2012 1919  UROBILINOGEN 0.2 05/02/2012 1919   NITRITE NEGATIVE 05/02/2012 1919   LEUKOCYTESUR NEGATIVE 05/02/2012 1919   Sepsis Labs: @LABRCNTIP (procalcitonin:4,lacticidven:4) )No results found for this or any previous visit (from the past 240 hour(s)).       Radiology Studies: No results found.    Scheduled Meds: . aspirin EC  81 mg Oral Daily  . atorvastatin  40 mg Oral q1800  . buprenorphine-naloxone  1 tablet Sublingual TID  . carvedilol  6.25 mg Oral BID WC  . docusate sodium  100 mg Oral BID  . enoxaparin (LOVENOX) injection  40 mg Subcutaneous Q24H  . FLUoxetine  40 mg Oral Daily  . gabapentin  600 mg Oral TID  . lisinopril  5 mg Oral Daily  . loratadine  10 mg Oral Daily  . pantoprazole  40 mg Oral Daily  . sodium chloride flush  3 mL Intravenous Q12H   Continuous Infusions: . sodium chloride       LOS: 2 days    Time spent in minutes: [place], MD [facility] Pager: [email or web address] Password [facility] 03/30/2018, 4:54 PM

## 2018-03-30 NOTE — Progress Notes (Signed)
      [address]       [place] [address]             [phone]      No complaints this AM BP 131/75 (BP Location: Left Arm)   Pulse 63   Temp 98.4 F (36.9 C) (Oral)   Resp 13   Ht 5\' 2"  (1.575 m)   Wt 118 lb 12.8 oz (53.9 kg)   SpO2 97%   BMI 21.73 kg/m   Intake/Output Summary (Last 24 hours) at 03/30/2018 0926 Last data filed at 03/30/2018 0340 Gross per 24 hour  Intake 555 ml  Output 800 ml  Net -245 ml   Cath showed no hemodynamically significant CAD  For resection of left atrial myxoma Thursday   Reviewed risks/ benefits- she does not have any questions at this time  Remo Lipps C. Roxan Hockey, MD [facility] [phone]

## 2018-03-31 ENCOUNTER — Encounter (HOSPITAL_COMMUNITY): Payer: Self-pay | Admitting: Anesthesiology

## 2018-03-31 DIAGNOSIS — I519 Heart disease, unspecified: Secondary | ICD-10-CM

## 2018-03-31 LAB — LIPID PANEL
CHOLESTEROL: 204 mg/dL — AB (ref 0–200)
HDL: 60 mg/dL (ref >40)
LDL CALC: 133 mg/dL — AB (ref 0–99)
TRIGLYCERIDES: 55 mg/dL (ref <150)
Total CHOL/HDL Ratio: 3.4 RATIO
VLDL: 11 mg/dL (ref 0–40)

## 2018-03-31 LAB — PROTIME-INR
INR: 1.03
Prothrombin Time: 13.4 seconds (ref 11.4–15.2)

## 2018-03-31 LAB — APTT: aPTT: 36 seconds (ref 24–36)

## 2018-03-31 MED ORDER — ALPRAZOLAM 0.25 MG PO TABS
0.2500 mg | ORAL_TABLET | ORAL | Status: DC | PRN
  Administered 2018-03-31: 0.25 mg via ORAL
  Administered 2018-04-01: 0.5 mg via ORAL
  Filled 2018-03-31: qty 2
  Filled 2018-03-31: qty 1

## 2018-03-31 MED ORDER — DEXMEDETOMIDINE HCL IN NACL 400 MCG/100ML IV SOLN
0.1000 ug/kg/h | INTRAVENOUS | Status: AC
  Administered 2018-04-01: .3 ug/kg/h via INTRAVENOUS
  Filled 2018-03-31: qty 100

## 2018-03-31 MED ORDER — BISACODYL 5 MG PO TBEC
5.0000 mg | DELAYED_RELEASE_TABLET | Freq: Once | ORAL | Status: AC
  Administered 2018-03-31: 5 mg via ORAL
  Filled 2018-03-31: qty 1

## 2018-03-31 MED ORDER — POTASSIUM CHLORIDE 2 MEQ/ML IV SOLN
80.0000 meq | INTRAVENOUS | Status: DC
  Filled 2018-03-31: qty 40

## 2018-03-31 MED ORDER — MAGNESIUM SULFATE 50 % IJ SOLN
40.0000 meq | INTRAMUSCULAR | Status: DC
  Filled 2018-03-31: qty 9.85

## 2018-03-31 MED ORDER — CHLORHEXIDINE GLUCONATE CLOTH 2 % EX PADS: 6.0000 | MEDICATED_PAD | Freq: Once | CUTANEOUS | Status: DC

## 2018-03-31 MED ORDER — SODIUM CHLORIDE 0.9 % IV SOLN
INTRAVENOUS | Status: DC
  Filled 2018-03-31: qty 1

## 2018-03-31 MED ORDER — PLASMA-LYTE 148 IV SOLN
INTRAVENOUS | Status: DC
  Filled 2018-03-31: qty 2.5

## 2018-03-31 MED ORDER — CHLORHEXIDINE GLUCONATE 0.12 % MT SOLN: 15.0000 mL | Freq: Once | OROMUCOSAL | Status: DC

## 2018-03-31 MED ORDER — TRANEXAMIC ACID (OHS) PUMP PRIME SOLUTION
2.0000 mg/kg | INTRAVENOUS | Status: DC
  Filled 2018-03-31: qty 1.08

## 2018-03-31 MED ORDER — DOPAMINE-DEXTROSE 3.2-5 MG/ML-% IV SOLN
0.0000 ug/kg/min | INTRAVENOUS | Status: AC
  Administered 2018-04-01: 3 ug/kg/min via INTRAVENOUS
  Filled 2018-03-31: qty 250

## 2018-03-31 MED ORDER — SODIUM CHLORIDE 0.9 % IV SOLN
750.0000 mg | INTRAVENOUS | Status: DC
  Filled 2018-03-31: qty 750

## 2018-03-31 MED ORDER — TRANEXAMIC ACID (OHS) BOLUS VIA INFUSION
15.0000 mg/kg | INTRAVENOUS | Status: AC
  Administered 2018-04-01: 808.5 mg via INTRAVENOUS
  Filled 2018-03-31: qty 809

## 2018-03-31 MED ORDER — CHLORHEXIDINE GLUCONATE CLOTH 2 % EX PADS: 6.0000 | MEDICATED_PAD | Freq: Once | CUTANEOUS | Status: AC

## 2018-03-31 MED ORDER — MILRINONE LACTATE IN DEXTROSE 20-5 MG/100ML-% IV SOLN
0.1250 ug/kg/min | INTRAVENOUS | Status: DC
  Filled 2018-03-31: qty 100

## 2018-03-31 MED ORDER — TRANEXAMIC ACID 1000 MG/10ML IV SOLN
1.5000 mg/kg/h | INTRAVENOUS | Status: AC
  Administered 2018-04-01: 1.5 mg/kg/h via INTRAVENOUS
  Filled 2018-03-31: qty 25

## 2018-03-31 MED ORDER — DIAZEPAM 2 MG PO TABS
2.0000 mg | ORAL_TABLET | Freq: Once | ORAL | Status: AC
  Administered 2018-04-01: 2 mg via ORAL
  Filled 2018-03-31: qty 1

## 2018-03-31 MED ORDER — VANCOMYCIN HCL IN DEXTROSE 1-5 GM/200ML-% IV SOLN
1000.0000 mg | INTRAVENOUS | Status: AC
  Administered 2018-04-01: 1000 mg via INTRAVENOUS
  Filled 2018-03-31: qty 200

## 2018-03-31 MED ORDER — HEPARIN SODIUM (PORCINE) 1000 UNIT/ML IJ SOLN
INTRAMUSCULAR | Status: DC
  Filled 2018-03-31: qty 30

## 2018-03-31 MED ORDER — PHENYLEPHRINE HCL 10 MG/ML IJ SOLN
30.0000 ug/min | INTRAMUSCULAR | Status: DC
  Filled 2018-03-31: qty 2

## 2018-03-31 MED ORDER — SODIUM CHLORIDE 0.9 % IV SOLN
1.5000 g | INTRAVENOUS | Status: AC
  Administered 2018-04-01: .75 g via INTRAVENOUS
  Administered 2018-04-01: 1.5 g via INTRAVENOUS
  Filled 2018-03-31: qty 1.5

## 2018-03-31 MED ORDER — NITROGLYCERIN IN D5W 200-5 MCG/ML-% IV SOLN
2.0000 ug/min | INTRAVENOUS | Status: AC
  Administered 2018-04-01: 16.6 ug/min via INTRAVENOUS
  Filled 2018-03-31: qty 250

## 2018-03-31 MED ORDER — METOPROLOL TARTRATE 12.5 MG HALF TABLET: 12.5000 mg | ORAL_TABLET | Freq: Once | ORAL | Status: DC

## 2018-03-31 MED ORDER — EPINEPHRINE PF 1 MG/ML IJ SOLN
0.0000 ug/min | INTRAVENOUS | Status: DC
  Filled 2018-03-31: qty 4

## 2018-03-31 NOTE — Anesthesia Preprocedure Evaluation (Addendum)
Anesthesia Evaluation  Patient identified by MRN, date of birth, ID band Patient awake    Reviewed: Allergy & Precautions, NPO status , Patient's Chart, lab work & pertinent test results  Airway Mallampati: I  TM Distance: >3 FB Neck ROM: Full    Dental  (+) Edentulous Upper, Edentulous Lower   Pulmonary COPD, Current Smoker,     + decreased breath sounds      Cardiovascular hypertension,  Rhythm:Regular Rate:Normal     Neuro/Psych  Headaches, PSYCHIATRIC DISORDERS Anxiety Depression  Neuromuscular disease    GI/Hepatic Neg liver ROS, PUD, GERD  Medicated,  Endo/Other  negative endocrine ROS  Renal/GU negative Renal ROS     Musculoskeletal  (+) Arthritis , Osteoarthritis,    Abdominal   Peds  Hematology negative hematology ROS (+)   Anesthesia Other Findings   Reproductive/Obstetrics                            Lab Results  Component Value Date   WBC 5.6 03/30/2018   HGB 11.6 (L) 03/30/2018   HCT 37.1 03/30/2018   MCV 91.8 03/30/2018   PLT 246 03/30/2018   Lab Results  Component Value Date   CREATININE 0.62 03/30/2018   BUN 7 03/30/2018   NA 138 03/30/2018   K 3.7 03/30/2018   CL 103 03/30/2018   CO2 26 03/30/2018   Lab Results  Component Value Date   INR 1.03 03/31/2018   INR 1.02 03/27/2018   INR 0.91 05/02/2012   Echo: - Left ventricle: The cavity size was normal. Wall thickness was   normal. Systolic function was normal. The estimated ejection   fraction was in the range of 55% to 60%. Wall motion was normal;   there were no regional wall motion abnormalities. Doppler   parameters are consistent with high ventricular filling pressure. - Mitral valve: There was mild regurgitation. - Left atrium: The atrium was severely dilated. - Pulmonary arteries: Systolic pressure was mildly increased.  EKG: NSR  Anesthesia Physical Anesthesia Plan  ASA: IV  Anesthesia Plan:  General   Post-op Pain Management:    Induction: Intravenous  PONV Risk Score and Plan: 3 and Ondansetron, Midazolam, Treatment may vary due to age or medical condition and Dexamethasone  Airway Management Planned: Oral ETT  Additional Equipment: Arterial line, CVP, PA Cath, TEE and Ultrasound Guidance Line Placement  Intra-op Plan:   Post-operative Plan: Possible Post-op intubation/ventilation  Informed Consent:   Plan Discussed with:   Anesthesia Plan Comments:       Anesthesia Quick Evaluation

## 2018-03-31 NOTE — Care Management Note (Signed)
Case Management Note Marvetta Gibbons RN, BSN Unit 4E-Case Manager [phone]  Patient Details  Name: [name] MRN: [id number] Date of Birth: [birth year]  Subjective/Objective:  Pt admitted with SOB s/p cath with atrial myxoma found- pt for OR with planned resection of myxoma on 04/01/18                 Action/Plan: PTA pt lived at home- CM to follow post op for transition of care needs  Expected Discharge Date:  03/30/18               Expected Discharge Plan:  Home/Self Care  In-House Referral:     Discharge planning Services  CM Consult  Post Acute Care Choice:    Choice offered to:     DME Arranged:    DME Agency:     HH Arranged:    HH Agency:     Status of Service:  In process, will continue to follow  If discussed at Long Length of Stay Meetings, dates discussed:    Discharge Disposition:   Additional Comments:  Dawayne Patricia, RN 03/31/2018, 3:33 PM

## 2018-03-31 NOTE — Progress Notes (Signed)
Progress Note  Patient Name: Jacqueline [name] Date of Encounter: 03/31/2018  Primary Cardiologist:  Mertie Moores, MD  Subjective   No CP, no SOB, Some occasional lightheadedness   Inpatient Medications    Scheduled Meds: . aspirin EC  81 mg Oral Daily  . atorvastatin  40 mg Oral q1800  . buprenorphine-naloxone  1 tablet Sublingual TID  . carvedilol  6.25 mg Oral BID WC  . docusate sodium  100 mg Oral BID  . enoxaparin (LOVENOX) injection  40 mg Subcutaneous Q24H  . FLUoxetine  40 mg Oral Daily  . gabapentin  600 mg Oral TID  . [START ON 04/01/2018] heparin-papaverine-plasmalyte irrigation   Irrigation To OR  . lisinopril  5 mg Oral Daily  . loratadine  10 mg Oral Daily  . [START ON 04/01/2018] magnesium sulfate  40 mEq Other To OR  . pantoprazole  40 mg Oral Daily  . [START ON 04/01/2018] potassium chloride  80 mEq Other To OR  . sodium chloride flush  3 mL Intravenous Q12H  . [START ON 04/01/2018] tranexamic acid  15 mg/kg Intravenous To OR  . [START ON 04/01/2018] tranexamic acid  2 mg/kg Intracatheter To OR   Continuous Infusions: . sodium chloride    . [START ON 04/01/2018] cefUROXime (ZINACEF)  IV    . [START ON 04/01/2018] cefUROXime (ZINACEF)  IV    . [START ON 04/01/2018] dexmedetomidine    . [START ON 04/01/2018] DOPamine    . [START ON 04/01/2018] epinephrine    . [START ON 04/01/2018] heparin 30,000 units/NS 1000 mL solution for CELLSAVER    . [START ON 04/01/2018] insulin (NOVOLIN-R) infusion    . [START ON 04/01/2018] milrinone    . [START ON 04/01/2018] nitroGLYCERIN    . [START ON 04/01/2018] phenylephrine 20mg /262mL NS (0.08mg /ml) infusion    . [START ON 04/01/2018] tranexamic acid (CYKLOKAPRON) infusion ([facility])    . [START ON 04/01/2018] vancomycin     PRN Meds: sodium chloride, acetaminophen, bisacodyl, cyclobenzaprine, morphine injection, nitroGLYCERIN, ondansetron (ZOFRAN) IV, polyethylene glycol, sodium chloride flush   Vital Signs    Vitals:   03/30/18  1159 03/30/18 1732 03/30/18 2006 03/31/18 0530  BP: 113/84  137/72 132/85  Pulse: 67 72  73  Resp:   12 19  Temp:   98.9 F (37.2 C) 97.9 F (36.6 C)  TempSrc:   Oral Oral  SpO2:   97% 96%  Weight:      Height:       No intake or output data in the 24 hours ending 03/31/18 1041 Filed Weights   03/27/18 0710 03/27/18 1827  Weight: 125 lb (56.7 kg) 118 lb 12.8 oz (53.9 kg)    Telemetry    SR, Sinus brady 50s - Personally Reviewed  ECG    04/07, SR, no acute changes - Personally Reviewed  Physical Exam: Blood pressure 132/85, pulse 73, temperature 97.9 F (36.6 C), temperature source Oral, resp. rate 19, height 5\' 2"  (1.575 m), weight 118 lb 12.8 oz (53.9 kg), SpO2 96 %.  GEN:  Well nourished, well developed in no acute distress HEENT: Normal NECK: No JVD; No carotid bruits LYMPHATICS: No lymphadenopathy CARDIAC: RRR  Soft diastolic murmur  RESPIRATORY:  Clear to auscultation without rales, wheezing or rhonchi  ABDOMEN: Soft, non-tender, non-distended MUSCULOSKELETAL:  No edema; No deformity  SKIN: Warm and dry NEUROLOGIC:  Alert and oriented x 3   Labs    Hematology Recent Labs  Lab 03/27/18 [id number] 03/30/18 0401  WBC 5.1 5.6  RBC 3.66* 4.04  HGB 10.9* 11.6*  HCT 34.6* 37.1  MCV 94.5 91.8  MCH 29.8 28.7  MCHC 31.5 31.3  RDW 14.1 14.1  PLT 239 246    Chemistry Recent Labs  Lab 03/27/18 0742 03/29/18 0239 03/30/18 0401  NA 137 140 138  K 3.4* 3.8 3.7  CL 98* 104 103  CO2 29 27 26   GLUCOSE 93 87 91  BUN 8 6 7   CREATININE 0.81 0.64 0.62  CALCIUM 8.8* 8.9 9.0  PROT 6.9  --   --   ALBUMIN 3.8  --   --   AST 17  --   --   ALT 9*  --   --   ALKPHOS 74  --   --   BILITOT 0.4  --   --   GFRNONAA >60 >60 >60  GFRAA >60 >60 >60  ANIONGAP 10 9 9      Cardiac Enzymes Recent Labs  Lab 03/27/18 0742 03/27/18 1532  TROPONINI <0.03 <0.03      BNP Recent Labs  Lab 03/27/18 0742  BNP 400.0*     Radiology    No results found.   Cardiac  Studies   ECHO:  03/27/2018 - Left ventricle: The cavity size was normal. Wall thickness was   normal. Systolic function was normal. The estimated ejection   fraction was in the range of 55% to 60%. Wall motion was normal;   there were no regional wall motion abnormalities. Doppler   parameters are consistent with high ventricular filling pressure. - Mitral valve: There was mild regurgitation. - Left atrium: The atrium was severely dilated. - Pulmonary arteries: Systolic pressure was mildly increased. Impressions: - Normal LV function; elevated LV filling pressure; large mass in   left atrium (probable myxoma) obstructing MV inflow (mean   gradient 8 mmHg) and mild MR; severe LAE; trace TR with mildly   elevated pulmonary pressure; results called to Dr Dyann Kief.  CATH: 03/29/2018 Diagnostic Diagram        Patient Profile     67 y.o. female w/ hx polysubs abuse, IBS, GERD, gastric tumor 1992, tob use was admitted 04/06 w/ SOB>>atrial mass on echo  Assessment & Plan     1.   Left atrial myxoma:   He developed a little lightheadedness while walking back from the bathroom.  This might be due to impaired mitral inflow due to the atrial myxoma.  Plan is for surgery on Thursday.  Minimal CAD by cath.  For surgery tomorrow      I have spent a total of 40 minutes with patient reviewing hospital  notes , telemetry, EKGs, labs and examining patient as well as establishing an assessment and plan that was discussed with the patient. > 50% of time was spent in direct patient care.    Thayer Headings, Brooke Bonito., MD, [facility] 03/31/2018, 10:41 AM [address]  [place] Pager [phone]

## 2018-03-31 NOTE — Progress Notes (Signed)
2 Days Post-Op Procedure(s) (LRB): LEFT HEART CATH AND CORONARY ANGIOGRAPHY (N/A) Subjective: Anxious about having surgery tomorrow  Objective: Vital signs in last 24 hours: Temp:  [97.9 F (36.6 C)-98.9 F (37.2 C)] 97.9 F (36.6 C) (04/10 0530) Pulse Rate:  [68-73] 68 (04/10 1226) Cardiac Rhythm: Normal sinus rhythm;Sinus bradycardia (04/10 0830) Resp:  [12-21] 21 (04/10 1226) BP: (104-137)/(72-86) 104/86 (04/10 1226) SpO2:  [96 %-97 %] 96 % (04/10 0530)  Hemodynamic parameters for last 24 hours:    Intake/Output from previous day: 04/09 0701 - 04/10 0700 In: 125 [P.O.:125] Out: -  Intake/Output this shift: No intake/output data recorded.  General appearance: alert, cooperative and no distress Neurologic: intact Heart: faint systolic murmur Lungs: clear to auscultation bilaterally  Lab Results: Recent Labs    03/30/18 0401  WBC 5.6  HGB 11.6*  HCT 37.1  PLT 246   BMET:  Recent Labs    03/29/18 0239 03/30/18 0401  NA 140 138  K 3.8 3.7  CL 104 103  CO2 27 26  GLUCOSE 87 91  BUN 6 7  CREATININE 0.64 0.62  CALCIUM 8.9 9.0    PT/INR:  Recent Labs    03/31/18 1405  LABPROT 13.4  INR 1.03   ABG No results found for: PHART, HCO3, TCO2, ACIDBASEDEF, O2SAT CBG (last 3)  No results for input(s): GLUCAP in the last 72 hours.  Assessment/Plan: S/P Procedure(s) (LRB): LEFT HEART CATH AND CORONARY ANGIOGRAPHY (N/A) - For removal of left atrial myxoma tomorrow She is understandably anxious PRN xanax ordered for anxiety All questions answered   LOS: 3 days    Melrose Nakayama 03/31/2018

## 2018-03-31 NOTE — Progress Notes (Signed)
PROGRESS NOTE    ZABRIA LISS   YKD:[id number]  DOB: December 16, 1951  DOA: 03/27/2018 PCP: Neale Burly, MD   Brief Narrative:  Jacqueline [name] is a 67 year old female cigarette-smoker, 1/2 pack daily, peripheral neuropathy, chronic constipation (last BM was on 4/2), GERD and chronic pain in neck and all of her back for which she is on Suboxone by pain management, Dr Mirna Mires.   Her medical record review shows that she has been known to use cocaine, opiates and marijuana. The patient states she still smokes marijuana regularly, but does not use any other illicit drugs  (clean for over 2 years).  Since this summer she has been having shortness of breath and dizziness on exertion. Symptoms were severe over the past couple of days prior to admission and she could barely walk without dizziness and dyspnea.  ED:  CXR > moderately enlarged cardiac silhouette, mild CHF with mild diffuse interstitial edema, and small bilateral pleural effusions. BNP at 400 and negative troponin I UDS + for marijuana Admitted for ACS rule out.   2 d ECHO> Normal LV function; elevated LV filling pressure; large mass in   left atrium (probable myxoma) obstructing MV inflow (mean   gradient 8 mmHg) and mild MR; severe LAE; trace TR with mildly   elevated pulmonary pressure  Transferred from [facility] to [facility] for CT surgery eval.   Subjective: Pt has no new complaints today.   Assessment & Plan:   Principal Problem: Dyspnea on exertion with lightheaded sensation - could have many reasons for this including: COPD and atrial mass - cardiology consulted- Cardiac cath done and shows minimal CAD - CT surgery has evaluated and plans on surgery on Wed or Thursday  Mild non-obstructive CAD- 40% LAD - cath 4/8 - cardiology following- have ordered Fasting lipids and Statin - A1c is 5.5 - also on ASA, Coreg, Lisinopril  Chronic bronchitis Smoker -cough with tan sputum for 1 yr - smoking for 20 years - states Nicotine  patch gives her a headache and she has declined it- have d/c'd the order - advised to stop smoking - will need outpt PFTs    HTN - Lisinopril and Coreg started on admission  Hypokalemia  - wnl on last check  Chronic neck and back pain/ Arthritis -  Con Suboxone, Voltaren gel, Neutontin and Flexeril which she takes at home  GERD - stable currently  Depression - Prozac  H/o drug abuse - stopped Cocaine - continues to use Marijuana UDS +  Chronic constipation/ on narcotics - cont Miralax PRN and Colace BID - no BM yet - almost 1 wk now- have added a Dulcolax suppository today - will order Enema if no results with suppository   DVT prophylaxis: Lovenox Code Status: Full code Family Communication:   Disposition Plan:  CT surgery will operate soon Consultants:   Cardiology  CT surgery Procedures:   2 D ECHO Left ventricle: The cavity size was normal. Wall thickness was normal. Systolic function was normal. The estimated ejection fraction was in the range of 55% to 60%. Wall motion was normal; there were no regional wall motion abnormalities. Doppler parameters are consistent with high ventricular filling pressure. - Mitral valve: There was mild regurgitation. - Left atrium: The atrium was severely dilated. - Pulmonary arteries: Systolic pressure was mildly increased. Impressions: - Normal LV function; elevated LV filling pressure; large mass in left atrium (probable myxoma) obstructing MV inflow (mean gradient 8 mmHg) and mild MR; severe LAE;  trace TR with mildly elevated pulmonary pressure; results called to Dr Dyann Kief.    Antimicrobials:  Anti-infectives (From admission, onward)   Start     Dose/Rate Route Frequency Ordered Stop   04/01/18 0400  vancomycin (VANCOCIN) IVPB 1000 mg/200 mL premix     1,000 mg 200 mL/hr over 60 Minutes Intravenous To Surgery 03/31/18 0801 04/02/18 0400   04/01/18 0400  cefUROXime (ZINACEF) 1.5 g in sodium chloride  0.9 % 100 mL IVPB     1.5 g 200 mL/hr over 30 Minutes Intravenous To Surgery 03/31/18 0801 04/02/18 0400   04/01/18 0400  cefUROXime (ZINACEF) 750 mg in sodium chloride 0.9 % 100 mL IVPB     750 mg 200 mL/hr over 30 Minutes Intravenous To Surgery 03/31/18 0801 04/02/18 0400       Objective: Vitals:   03/30/18 1732 03/30/18 2006 03/31/18 0530 03/31/18 1226  BP:  137/72 132/85 104/86  Pulse: 72  73 68  Resp:  12 19 (!) 21  Temp:  98.9 F (37.2 C) 97.9 F (36.6 C)   TempSrc:  Oral Oral   SpO2:  97% 96%   Weight:      Height:       No intake or output data in the 24 hours ending 03/31/18 1245 Filed Weights   03/27/18 0710 03/27/18 1827  Weight: 56.7 kg (125 lb) 53.9 kg (118 lb 12.8 oz)    Examination: General exam: Appears comfortable, in nad HEENT: PERRLA, oral mucosa moist, no sclera icterus or thrush Respiratory system: Clear to auscultation. Respiratory effort normal. Cardiovascular system: S1 & S2 heard,  No murmurs  Gastrointestinal system: Abdomen soft, non-tender, nondistended. Normal bowel sound. No organomegaly Central nervous system: Alert and oriented. No focal neurological deficits. Extremities: No cyanosis, clubbing or edema Skin: No rashes or ulcers Psychiatry:  Mood & affect appropriate.    Data Reviewed: I have personally reviewed following labs and imaging studies  CBC: Recent Labs  Lab 03/27/18 0742 03/30/18 0401  WBC 5.1 5.6  HGB 10.9* 11.6*  HCT 34.6* 37.1  MCV 94.5 91.8  PLT 239 993   Basic Metabolic Panel: Recent Labs  Lab 03/27/18 0742 03/29/18 0239 03/30/18 0401  NA 137 140 138  K 3.4* 3.8 3.7  CL 98* 104 103  CO2 29 27 26   GLUCOSE 93 87 91  BUN 8 6 7   CREATININE 0.81 0.64 0.62  CALCIUM 8.8* 8.9 9.0   GFR: Estimated Creatinine Clearance: 56.2 mL/min (by C-G formula based on SCr of 0.62 mg/dL). Liver Function Tests: Recent Labs  Lab 03/27/18 0742  AST 17  ALT 9*  ALKPHOS 74  BILITOT 0.4  PROT 6.9  ALBUMIN 3.8   No  results for input(s): LIPASE, AMYLASE in the last 168 hours. No results for input(s): AMMONIA in the last 168 hours. Coagulation Profile: Recent Labs  Lab 03/27/18 0742  INR 1.02   Cardiac Enzymes: Recent Labs  Lab 03/27/18 0742 03/27/18 1532  TROPONINI <0.03 <0.03   BNP (last 3 results) No results for input(s): PROBNP in the last 8760 hours. HbA1C: No results for input(s): HGBA1C in the last 72 hours. CBG: Recent Labs  Lab 03/27/18 0732  GLUCAP 100*   Lipid Profile: Recent Labs    03/31/18 0233  CHOL 204*  HDL 60  LDLCALC 133*  TRIG 55  CHOLHDL 3.4   Thyroid Function Tests: No results for input(s): TSH, T4TOTAL, FREET4, T3FREE, THYROIDAB in the last 72 hours. Anemia Panel: No results for input(s): VITAMINB12,  FOLATE, FERRITIN, TIBC, IRON, RETICCTPCT in the last 72 hours. Urine analysis:    Component Value Date/Time   COLORURINE YELLOW 05/02/2012 1919   APPEARANCEUR CLEAR 05/02/2012 1919   LABSPEC 1.010 05/02/2012 1919   PHURINE 7.5 05/02/2012 1919   GLUCOSEU NEGATIVE 05/02/2012 1919   HGBUR TRACE (A) 05/02/2012 1919   BILIRUBINUR NEGATIVE 05/02/2012 1919   KETONESUR NEGATIVE 05/02/2012 1919   PROTEINUR NEGATIVE 05/02/2012 1919   UROBILINOGEN 0.2 05/02/2012 1919   NITRITE NEGATIVE 05/02/2012 1919   LEUKOCYTESUR NEGATIVE 05/02/2012 1919   Sepsis Labs: @LABRCNTIP (procalcitonin:4,lacticidven:4) )No results found for this or any previous visit (from the past 240 hour(s)).       Radiology Studies: No results found.    Scheduled Meds: . aspirin EC  81 mg Oral Daily  . atorvastatin  40 mg Oral q1800  . buprenorphine-naloxone  1 tablet Sublingual TID  . carvedilol  6.25 mg Oral BID WC  . docusate sodium  100 mg Oral BID  . enoxaparin (LOVENOX) injection  40 mg Subcutaneous Q24H  . FLUoxetine  40 mg Oral Daily  . gabapentin  600 mg Oral TID  . [START ON 04/01/2018] heparin-papaverine-plasmalyte irrigation   Irrigation To OR  . lisinopril  5 mg Oral  Daily  . loratadine  10 mg Oral Daily  . [START ON 04/01/2018] magnesium sulfate  40 mEq Other To OR  . pantoprazole  40 mg Oral Daily  . [START ON 04/01/2018] potassium chloride  80 mEq Other To OR  . sodium chloride flush  3 mL Intravenous Q12H  . [START ON 04/01/2018] tranexamic acid  15 mg/kg Intravenous To OR  . [START ON 04/01/2018] tranexamic acid  2 mg/kg Intracatheter To OR   Continuous Infusions: . sodium chloride    . [START ON 04/01/2018] cefUROXime (ZINACEF)  IV    . [START ON 04/01/2018] cefUROXime (ZINACEF)  IV    . [START ON 04/01/2018] dexmedetomidine    . [START ON 04/01/2018] DOPamine    . [START ON 04/01/2018] epinephrine    . [START ON 04/01/2018] heparin 30,000 units/NS 1000 mL solution for CELLSAVER    . [START ON 04/01/2018] insulin (NOVOLIN-R) infusion    . [START ON 04/01/2018] milrinone    . [START ON 04/01/2018] nitroGLYCERIN    . [START ON 04/01/2018] phenylephrine 20mg /213mL NS (0.08mg /ml) infusion    . [START ON 04/01/2018] tranexamic acid (CYKLOKAPRON) infusion ([facility])    . [START ON 04/01/2018] vancomycin       LOS: 3 days    Time spent in minutes: 30 min    Velvet Bathe, MD [facility] Pager: [email or web address] Password [email or web address] 03/31/2018, 12:45 PM

## 2018-03-31 NOTE — Progress Notes (Signed)
1100-1130 Did not walk with pt since she was lightheaded this morning going to bathroom. Gave pt [facility] booklet and care guide and in the tube handout. Discussed keeping arms close to the body. Gave IS and pt able to get to 1000 ml. Discussed importance of walking and IS after surgery. Discussed with pt the need to have someone with her 24/7 first week home. Pt stated this would not be a problem. Wrote down how to view pre op video and system is working as I checked. Will follow up after surgery. Graylon Good RN BSN 03/31/2018 11:28 AM

## 2018-04-01 ENCOUNTER — Inpatient Hospital Stay (HOSPITAL_COMMUNITY): Payer: Medicaid Other

## 2018-04-01 ENCOUNTER — Inpatient Hospital Stay (HOSPITAL_COMMUNITY): Payer: Medicaid Other | Admitting: Certified Registered Nurse Anesthetist

## 2018-04-01 ENCOUNTER — Encounter (HOSPITAL_COMMUNITY)
Admission: EM | Disposition: A | Discharge status: Home / Self Care | Payer: Self-pay | Source: Home / Self Care | Attending: Thoracic Surgery (Cardiothoracic Vascular Surgery)

## 2018-04-01 DIAGNOSIS — D219 Benign neoplasm of connective and other soft tissue, unspecified: Secondary | ICD-10-CM | POA: Diagnosis present

## 2018-04-01 HISTORY — PX: EXCISION OF ATRIAL MYXOMA: SHX5821

## 2018-04-01 HISTORY — PX: MITRAL VALVE REPAIR: CATH118311

## 2018-04-01 HISTORY — PX: TEE WITHOUT CARDIOVERSION: SHX5443

## 2018-04-01 LAB — CBC
HCT: 35.8 % — ABNORMAL LOW (ref 36.0–46.0)
HEMATOCRIT: 37.9 % (ref 36.0–46.0)
HEMATOCRIT: 30.3 % — AB (ref 36.0–46.0)
HEMOGLOBIN: 12.4 g/dL (ref 12.0–15.0)
Hemoglobin: 11.3 g/dL — ABNORMAL LOW (ref 12.0–15.0)
Hemoglobin: 10.2 g/dL — ABNORMAL LOW (ref 12.0–15.0)
MCH: 29.3 pg (ref 26.0–34.0)
MCH: 29.8 pg (ref 26.0–34.0)
MCH: 30.2 pg (ref 26.0–34.0)
MCHC: 31.6 g/dL (ref 30.0–36.0)
MCHC: 32.7 g/dL (ref 30.0–36.0)
MCHC: 33.7 g/dL (ref 30.0–36.0)
MCV: 92.7 fL (ref 78.0–100.0)
MCV: 91.1 fL (ref 78.0–100.0)
MCV: 89.6 fL (ref 78.0–100.0)
PLATELETS: 227 10*3/uL (ref 150–400)
PLATELETS: 129 10*3/uL — AB (ref 150–400)
Platelets: 145 10*3/uL — ABNORMAL LOW (ref 150–400)
RBC: 3.86 MIL/uL — AB (ref 3.87–5.11)
RBC: 4.16 MIL/uL (ref 3.87–5.11)
RBC: 3.38 MIL/uL — ABNORMAL LOW (ref 3.87–5.11)
RDW: 14.3 % (ref 11.5–15.5)
RDW: 14.3 % (ref 11.5–15.5)
RDW: 14.2 % (ref 11.5–15.5)
WBC: 5.2 10*3/uL (ref 4.0–10.5)
WBC: 13.6 10*3/uL — AB (ref 4.0–10.5)
WBC: 10.3 10*3/uL (ref 4.0–10.5)

## 2018-04-01 LAB — POCT I-STAT 3, ART BLOOD GAS (G3+)
ACID-BASE DEFICIT: 1 mmol/L (ref 0.0–2.0)
Acid-base deficit: 1 mmol/L (ref 0.0–2.0)
Acid-base deficit: 2 mmol/L (ref 0.0–2.0)
BICARBONATE: 24 mmol/L (ref 20.0–28.0)
BICARBONATE: 22.6 mmol/L (ref 20.0–28.0)
Bicarbonate: 25.1 mmol/L (ref 20.0–28.0)
O2 SAT: 94 %
O2 SAT: 99 %
O2 Saturation: 99 %
PCO2 ART: 36.8 mmHg (ref 32.0–48.0)
PCO2 ART: 47.3 mmHg (ref 32.0–48.0)
PO2 ART: 123 mmHg — AB (ref 83.0–108.0)
Patient temperature: 36.3
Patient temperature: 36.4
TCO2: 25 mmol/L (ref 22–32)
TCO2: 24 mmol/L (ref 22–32)
TCO2: 27 mmol/L (ref 22–32)
pCO2 arterial: 41.1 mmHg (ref 32.0–48.0)
pH, Arterial: 7.371 (ref 7.350–7.450)
pH, Arterial: 7.392 (ref 7.350–7.450)
pH, Arterial: 7.33 — ABNORMAL LOW (ref 7.350–7.450)
pO2, Arterial: 69 mmHg — ABNORMAL LOW (ref 83.0–108.0)
pO2, Arterial: 131 mmHg — ABNORMAL HIGH (ref 83.0–108.0)

## 2018-04-01 LAB — MAGNESIUM: MAGNESIUM: 2.8 mg/dL — AB (ref 1.7–2.4)

## 2018-04-01 LAB — POCT I-STAT 4, (NA,K, GLUC, HGB,HCT)
Glucose, Bld: 136 mg/dL — ABNORMAL HIGH (ref 65–99)
HEMATOCRIT: 36 % (ref 36.0–46.0)
HEMOGLOBIN: 12.2 g/dL (ref 12.0–15.0)
POTASSIUM: 4.2 mmol/L (ref 3.5–5.1)
SODIUM: 142 mmol/L (ref 135–145)

## 2018-04-01 LAB — URINALYSIS, ROUTINE W REFLEX MICROSCOPIC
Bilirubin Urine: NEGATIVE
Glucose, UA: NEGATIVE mg/dL
Ketones, ur: NEGATIVE mg/dL
Nitrite: NEGATIVE
Protein, ur: NEGATIVE mg/dL
SPECIFIC GRAVITY, URINE: 1.004 — AB (ref 1.005–1.030)
pH: 6 (ref 5.0–8.0)

## 2018-04-01 LAB — SURGICAL PCR SCREEN
MRSA, PCR: NEGATIVE
STAPHYLOCOCCUS AUREUS: NEGATIVE

## 2018-04-01 LAB — GLUCOSE, CAPILLARY
GLUCOSE-CAPILLARY: 142 mg/dL — AB (ref 65–99)
GLUCOSE-CAPILLARY: 105 mg/dL — AB (ref 65–99)
Glucose-Capillary: 160 mg/dL — ABNORMAL HIGH (ref 65–99)
Glucose-Capillary: 109 mg/dL — ABNORMAL HIGH (ref 65–99)
Glucose-Capillary: 109 mg/dL — ABNORMAL HIGH (ref 65–99)

## 2018-04-01 LAB — POCT I-STAT, CHEM 8
BUN: 7 mg/dL (ref 6–20)
CALCIUM ION: 1.1 mmol/L — AB (ref 1.15–1.40)
CHLORIDE: 103 mmol/L (ref 101–111)
Creatinine, Ser: 0.4 mg/dL — ABNORMAL LOW (ref 0.44–1.00)
Glucose, Bld: 105 mg/dL — ABNORMAL HIGH (ref 65–99)
HCT: 31 % — ABNORMAL LOW (ref 36.0–46.0)
Hemoglobin: 10.5 g/dL — ABNORMAL LOW (ref 12.0–15.0)
Potassium: 3.5 mmol/L (ref 3.5–5.1)
Sodium: 142 mmol/L (ref 135–145)
TCO2: 26 mmol/L (ref 22–32)

## 2018-04-01 LAB — BASIC METABOLIC PANEL
Anion gap: 10 (ref 5–15)
BUN: 9 mg/dL (ref 6–20)
CALCIUM: 8.8 mg/dL — AB (ref 8.9–10.3)
CO2: 26 mmol/L (ref 22–32)
CREATININE: 0.65 mg/dL (ref 0.44–1.00)
Chloride: 103 mmol/L (ref 101–111)
GFR calc Af Amer: >60 mL/min (ref >60)
Glucose, Bld: 96 mg/dL (ref 65–99)
POTASSIUM: 3.7 mmol/L (ref 3.5–5.1)
SODIUM: 139 mmol/L (ref 135–145)

## 2018-04-01 LAB — BLOOD GAS, ARTERIAL
Acid-Base Excess: 4.9 mmol/L — ABNORMAL HIGH (ref 0.0–2.0)
Bicarbonate: 29.6 mmol/L — ABNORMAL HIGH (ref 20.0–28.0)
Drawn by: 51133
O2 SAT: 94 %
PATIENT TEMPERATURE: 98.6
PH ART: 7.393 (ref 7.350–7.450)
pCO2 arterial: 49.5 mmHg — ABNORMAL HIGH (ref 32.0–48.0)
pO2, Arterial: 72.1 mmHg — ABNORMAL LOW (ref 83.0–108.0)

## 2018-04-01 LAB — HEMOGLOBIN AND HEMATOCRIT, BLOOD
HCT: 22.9 % — ABNORMAL LOW (ref 36.0–46.0)
HEMOGLOBIN: 7.2 g/dL — AB (ref 12.0–15.0)

## 2018-04-01 LAB — CREATININE, SERUM
CREATININE: 0.48 mg/dL (ref 0.44–1.00)
GFR calc non Af Amer: >60 mL/min (ref >60)

## 2018-04-01 LAB — PROTIME-INR
INR: 1.34
Prothrombin Time: 16.5 seconds — ABNORMAL HIGH (ref 11.4–15.2)

## 2018-04-01 LAB — PLATELET COUNT: PLATELETS: 92 10*3/uL — AB (ref 150–400)

## 2018-04-01 LAB — PREPARE RBC (CROSSMATCH)

## 2018-04-01 LAB — APTT: APTT: 36 s (ref 24–36)

## 2018-04-01 SURGERY — EXCISION, MYXOMA, CARDIAC ATRIUM
Anesthesia: General

## 2018-04-01 MED ORDER — METOPROLOL TARTRATE 12.5 MG HALF TABLET
12.5000 mg | ORAL_TABLET | Freq: Two times a day (BID) | ORAL | Status: DC
  Administered 2018-04-02 (×2): 12.5 mg via ORAL
  Filled 2018-04-01 (×3): qty 1

## 2018-04-01 MED ORDER — LACTATED RINGERS IV SOLN: 500.0000 mL | Freq: Once | INTRAVENOUS | Status: DC | PRN

## 2018-04-01 MED ORDER — LACTATED RINGERS IV SOLN
INTRAVENOUS | Status: DC | PRN
  Administered 2018-04-01: 07:00:00 via INTRAVENOUS

## 2018-04-01 MED ORDER — POTASSIUM CHLORIDE 10 MEQ/50ML IV SOLN: 10.0000 meq | INTRAVENOUS | Status: AC

## 2018-04-01 MED ORDER — METOPROLOL TARTRATE 25 MG/10 ML ORAL SUSPENSION: 12.5000 mg | Freq: Two times a day (BID) | ORAL | Status: DC

## 2018-04-01 MED ORDER — SODIUM CHLORIDE 0.9% FLUSH
3.0000 mL | Freq: Two times a day (BID) | INTRAVENOUS | Status: DC
  Administered 2018-04-02 – 2018-04-04 (×5): 3 mL via INTRAVENOUS

## 2018-04-01 MED ORDER — MORPHINE SULFATE (PF) 2 MG/ML IV SOLN
1.0000 mg | INTRAVENOUS | Status: AC | PRN
  Filled 2018-04-01: qty 1

## 2018-04-01 MED ORDER — SODIUM CHLORIDE 0.9% FLUSH: 3.0000 mL | INTRAVENOUS | Status: DC | PRN

## 2018-04-01 MED ORDER — ATORVASTATIN CALCIUM 40 MG PO TABS
40.0000 mg | ORAL_TABLET | Freq: Every day | ORAL | Status: DC
  Administered 2018-04-02 – 2018-04-05 (×4): 40 mg via ORAL
  Filled 2018-04-01 (×4): qty 1

## 2018-04-01 MED ORDER — INSULIN ASPART 100 UNIT/ML ~~LOC~~ SOLN
0.0000 [IU] | SUBCUTANEOUS | Status: DC
  Administered 2018-04-02 – 2018-04-03 (×3): 2 [IU] via SUBCUTANEOUS

## 2018-04-01 MED ORDER — PHENYLEPHRINE 40 MCG/ML (10ML) SYRINGE FOR IV PUSH (FOR BLOOD PRESSURE SUPPORT)
PREFILLED_SYRINGE | INTRAVENOUS | Status: AC
  Filled 2018-04-01: qty 10

## 2018-04-01 MED ORDER — MIDAZOLAM HCL 10 MG/2ML IJ SOLN
INTRAMUSCULAR | Status: AC
  Filled 2018-04-01: qty 2

## 2018-04-01 MED ORDER — CEFAZOLIN SODIUM-DEXTROSE 2-4 GM/100ML-% IV SOLN
2.0000 g | Freq: Three times a day (TID) | INTRAVENOUS | Status: AC
  Administered 2018-04-01 – 2018-04-03 (×6): 2 g via INTRAVENOUS
  Filled 2018-04-01 (×7): qty 100

## 2018-04-01 MED ORDER — SUCCINYLCHOLINE CHLORIDE 200 MG/10ML IV SOSY
PREFILLED_SYRINGE | INTRAVENOUS | Status: AC
  Filled 2018-04-01: qty 10

## 2018-04-01 MED ORDER — LIDOCAINE 2% (20 MG/ML) 5 ML SYRINGE
INTRAMUSCULAR | Status: AC
  Filled 2018-04-01: qty 5

## 2018-04-01 MED ORDER — FENTANYL CITRATE (PF) 100 MCG/2ML IJ SOLN
INTRAMUSCULAR | Status: DC | PRN
  Administered 2018-04-01: 150 ug via INTRAVENOUS
  Administered 2018-04-01 (×8): 100 ug via INTRAVENOUS
  Administered 2018-04-01: 50 ug via INTRAVENOUS
  Administered 2018-04-01: 100 ug via INTRAVENOUS

## 2018-04-01 MED ORDER — BISACODYL 5 MG PO TBEC
10.0000 mg | DELAYED_RELEASE_TABLET | Freq: Every day | ORAL | Status: DC
  Administered 2018-04-02 – 2018-04-04 (×3): 10 mg via ORAL
  Filled 2018-04-01 (×3): qty 2

## 2018-04-01 MED ORDER — LEVALBUTEROL HCL 0.63 MG/3ML IN NEBU
0.6300 mg | INHALATION_SOLUTION | Freq: Four times a day (QID) | RESPIRATORY_TRACT | Status: DC | PRN
Start: 2018-04-01 — End: 2018-04-06

## 2018-04-01 MED ORDER — BISACODYL 10 MG RE SUPP: 10.0000 mg | Freq: Every day | RECTAL | Status: DC

## 2018-04-01 MED ORDER — DOCUSATE SODIUM 100 MG PO CAPS
200.0000 mg | ORAL_CAPSULE | Freq: Every day | ORAL | Status: DC
  Administered 2018-04-02 – 2018-04-04 (×3): 200 mg via ORAL
  Filled 2018-04-01 (×3): qty 2

## 2018-04-01 MED ORDER — SODIUM CHLORIDE 0.9 % IV SOLN: INTRAVENOUS | Status: DC

## 2018-04-01 MED ORDER — SODIUM CHLORIDE 0.9% FLUSH: 10.0000 mL | INTRAVENOUS | Status: DC | PRN

## 2018-04-01 MED ORDER — SODIUM CHLORIDE 0.9 % IV SOLN
INTRAVENOUS | Status: DC | PRN
  Administered 2018-04-01: 12:00:00 via INTRAVENOUS

## 2018-04-01 MED ORDER — FENTANYL CITRATE (PF) 250 MCG/5ML IJ SOLN
INTRAMUSCULAR | Status: AC
  Filled 2018-04-01: qty 25

## 2018-04-01 MED ORDER — ONDANSETRON HCL 4 MG/2ML IJ SOLN
4.0000 mg | Freq: Four times a day (QID) | INTRAMUSCULAR | Status: DC | PRN
  Administered 2018-04-02 – 2018-04-03 (×2): 4 mg via INTRAVENOUS
  Filled 2018-04-01 (×2): qty 2

## 2018-04-01 MED ORDER — SODIUM CHLORIDE 0.9 % IV SOLN: Freq: Once | INTRAVENOUS | Status: DC

## 2018-04-01 MED ORDER — SODIUM CHLORIDE 0.9 % IV SOLN: 250.0000 mL | INTRAVENOUS | Status: DC

## 2018-04-01 MED ORDER — GLUTARALDEHYDE 0.625% SOAKING SOLUTION
TOPICAL | Status: DC
  Filled 2018-04-01 (×2): qty 50

## 2018-04-01 MED ORDER — SODIUM CHLORIDE 0.9% FLUSH
10.0000 mL | Freq: Two times a day (BID) | INTRAVENOUS | Status: DC
  Administered 2018-04-01 – 2018-04-03 (×4): 10 mL

## 2018-04-01 MED ORDER — PROPOFOL 10 MG/ML IV BOLUS
INTRAVENOUS | Status: AC
  Filled 2018-04-01: qty 20

## 2018-04-01 MED ORDER — FLUOXETINE HCL 20 MG PO CAPS
40.0000 mg | ORAL_CAPSULE | Freq: Every day | ORAL | Status: DC
  Administered 2018-04-02 – 2018-04-06 (×5): 40 mg via ORAL
  Filled 2018-04-01 (×5): qty 2

## 2018-04-01 MED ORDER — SODIUM CHLORIDE 0.9 % IJ SOLN
OROMUCOSAL | Status: DC | PRN
  Administered 2018-04-01 (×4): 3 mL via TOPICAL

## 2018-04-01 MED ORDER — ACETAMINOPHEN 160 MG/5ML PO SOLN: 650.0000 mg | Freq: Once | ORAL | Status: AC

## 2018-04-01 MED ORDER — PROPOFOL 10 MG/ML IV BOLUS
INTRAVENOUS | Status: DC | PRN
  Administered 2018-04-01: 100 mg via INTRAVENOUS

## 2018-04-01 MED ORDER — MORPHINE SULFATE (PF) 2 MG/ML IV SOLN
2.0000 mg | INTRAVENOUS | Status: DC | PRN
  Administered 2018-04-01 (×2): 2 mg via INTRAVENOUS
  Administered 2018-04-02 (×4): 4 mg via INTRAVENOUS
  Administered 2018-04-03: 5 mg via INTRAVENOUS
  Filled 2018-04-01: qty 2
  Filled 2018-04-01: qty 3
  Filled 2018-04-01 (×3): qty 2
  Filled 2018-04-01: qty 1

## 2018-04-01 MED ORDER — CHLORHEXIDINE GLUCONATE CLOTH 2 % EX PADS
6.0000 | MEDICATED_PAD | Freq: Every day | CUTANEOUS | Status: DC
  Administered 2018-04-01 – 2018-04-02 (×2): 6 via TOPICAL

## 2018-04-01 MED ORDER — ACETAMINOPHEN 650 MG RE SUPP
650.0000 mg | Freq: Once | RECTAL | Status: AC
  Administered 2018-04-01: 650 mg via RECTAL

## 2018-04-01 MED ORDER — ACETAMINOPHEN 160 MG/5ML PO SOLN: 1000.0000 mg | Freq: Four times a day (QID) | ORAL | Status: DC

## 2018-04-01 MED ORDER — METOPROLOL TARTRATE 5 MG/5ML IV SOLN: 2.5000 mg | INTRAVENOUS | Status: DC | PRN

## 2018-04-01 MED ORDER — INSULIN REGULAR BOLUS VIA INFUSION
0.0000 [IU] | Freq: Three times a day (TID) | INTRAVENOUS | Status: DC
  Filled 2018-04-01: qty 10

## 2018-04-01 MED ORDER — ALBUMIN HUMAN 5 % IV SOLN
250.0000 mL | INTRAVENOUS | Status: AC | PRN
  Administered 2018-04-01 (×3): 250 mL via INTRAVENOUS
  Filled 2018-04-01: qty 250

## 2018-04-01 MED ORDER — VANCOMYCIN HCL IN DEXTROSE 1-5 GM/200ML-% IV SOLN
1000.0000 mg | Freq: Once | INTRAVENOUS | Status: AC
  Administered 2018-04-01: 1000 mg via INTRAVENOUS
  Filled 2018-04-01: qty 200

## 2018-04-01 MED ORDER — OXYCODONE HCL 5 MG PO TABS
5.0000 mg | ORAL_TABLET | ORAL | Status: DC | PRN
  Administered 2018-04-02 – 2018-04-04 (×10): 10 mg via ORAL
  Filled 2018-04-01 (×10): qty 2

## 2018-04-01 MED ORDER — MIDAZOLAM HCL 2 MG/2ML IJ SOLN
2.0000 mg | INTRAMUSCULAR | Status: DC | PRN
  Filled 2018-04-01: qty 2

## 2018-04-01 MED ORDER — HEPARIN SODIUM (PORCINE) 1000 UNIT/ML IJ SOLN
INTRAMUSCULAR | Status: DC | PRN
  Administered 2018-04-01: 18000 [IU] via INTRAVENOUS

## 2018-04-01 MED ORDER — PANTOPRAZOLE SODIUM 40 MG PO TBEC
40.0000 mg | DELAYED_RELEASE_TABLET | Freq: Every day | ORAL | Status: DC
  Administered 2018-04-03 – 2018-04-04 (×2): 40 mg via ORAL
  Filled 2018-04-01 (×2): qty 1

## 2018-04-01 MED ORDER — HEMOSTATIC AGENTS (NO CHARGE) OPTIME
TOPICAL | Status: DC | PRN
  Administered 2018-04-01: 1 via TOPICAL

## 2018-04-01 MED ORDER — ROCURONIUM BROMIDE 10 MG/ML (PF) SYRINGE
PREFILLED_SYRINGE | INTRAVENOUS | Status: AC
  Filled 2018-04-01: qty 5

## 2018-04-01 MED ORDER — SODIUM CHLORIDE 0.9 % IV SOLN: 10.0000 mL/h | Freq: Once | INTRAVENOUS | Status: DC

## 2018-04-01 MED ORDER — HEPARIN SODIUM (PORCINE) 1000 UNIT/ML IJ SOLN
INTRAMUSCULAR | Status: AC
  Filled 2018-04-01: qty 1

## 2018-04-01 MED ORDER — ROCURONIUM BROMIDE 100 MG/10ML IV SOLN
INTRAVENOUS | Status: DC | PRN
  Administered 2018-04-01: 40 mg via INTRAVENOUS
  Administered 2018-04-01: 50 mg via INTRAVENOUS
  Administered 2018-04-01: 60 mg via INTRAVENOUS

## 2018-04-01 MED ORDER — GABAPENTIN 300 MG PO CAPS
600.0000 mg | ORAL_CAPSULE | Freq: Three times a day (TID) | ORAL | Status: DC
  Administered 2018-04-02 – 2018-04-06 (×13): 600 mg via ORAL
  Filled 2018-04-01 (×13): qty 2

## 2018-04-01 MED ORDER — MIDAZOLAM HCL 5 MG/5ML IJ SOLN
INTRAMUSCULAR | Status: DC | PRN
  Administered 2018-04-01: 3 mg via INTRAVENOUS
  Administered 2018-04-01: 1 mg via INTRAVENOUS
  Administered 2018-04-01: 2 mg via INTRAVENOUS
  Administered 2018-04-01: 1 mg via INTRAVENOUS

## 2018-04-01 MED ORDER — ARTIFICIAL TEARS OPHTHALMIC OINT
TOPICAL_OINTMENT | OPHTHALMIC | Status: DC | PRN
  Administered 2018-04-01: 1 via OPHTHALMIC

## 2018-04-01 MED ORDER — PROTAMINE SULFATE 10 MG/ML IV SOLN
INTRAVENOUS | Status: AC
  Filled 2018-04-01: qty 25

## 2018-04-01 MED ORDER — ALBUMIN HUMAN 5 % IV SOLN
INTRAVENOUS | Status: DC | PRN
  Administered 2018-04-01: 12:00:00 via INTRAVENOUS

## 2018-04-01 MED ORDER — CHLORHEXIDINE GLUCONATE 0.12 % MT SOLN
15.0000 mL | OROMUCOSAL | Status: AC
  Administered 2018-04-01: 15 mL via OROMUCOSAL

## 2018-04-01 MED ORDER — LACTATED RINGERS IV SOLN: INTRAVENOUS | Status: DC

## 2018-04-01 MED ORDER — ACETAMINOPHEN 500 MG PO TABS
1000.0000 mg | ORAL_TABLET | Freq: Four times a day (QID) | ORAL | Status: DC
  Administered 2018-04-02 – 2018-04-04 (×11): 1000 mg via ORAL
  Filled 2018-04-01 (×12): qty 2

## 2018-04-01 MED ORDER — 0.9 % SODIUM CHLORIDE (POUR BTL) OPTIME
TOPICAL | Status: DC | PRN
  Administered 2018-04-01: 1000 mL

## 2018-04-01 MED ORDER — EPHEDRINE 5 MG/ML INJ
INTRAVENOUS | Status: AC
  Filled 2018-04-01: qty 10

## 2018-04-01 MED ORDER — PROTAMINE SULFATE 10 MG/ML IV SOLN
INTRAVENOUS | Status: DC | PRN
  Administered 2018-04-01: 180 mg via INTRAVENOUS

## 2018-04-01 MED ORDER — ORAL CARE MOUTH RINSE
15.0000 mL | Freq: Two times a day (BID) | OROMUCOSAL | Status: DC
  Administered 2018-04-01 – 2018-04-04 (×4): 15 mL via OROMUCOSAL

## 2018-04-01 MED ORDER — PHENYLEPHRINE HCL 10 MG/ML IJ SOLN
INTRAMUSCULAR | Status: DC | PRN
  Administered 2018-04-01: 20 ug/min via INTRAVENOUS

## 2018-04-01 MED ORDER — POTASSIUM CHLORIDE 10 MEQ/50ML IV SOLN
10.0000 meq | INTRAVENOUS | Status: AC
  Administered 2018-04-01 (×3): 10 meq via INTRAVENOUS
  Filled 2018-04-01 (×3): qty 50

## 2018-04-01 MED ORDER — MAGNESIUM SULFATE 4 GM/100ML IV SOLN
4.0000 g | Freq: Once | INTRAVENOUS | Status: AC
  Administered 2018-04-01: 4 g via INTRAVENOUS
  Filled 2018-04-01: qty 100

## 2018-04-01 MED ORDER — SODIUM CHLORIDE 0.9 % IV SOLN
INTRAVENOUS | Status: DC | PRN
  Administered 2018-04-01: .3 [IU]/h via INTRAVENOUS

## 2018-04-01 MED ORDER — DEXMEDETOMIDINE HCL IN NACL 400 MCG/100ML IV SOLN: 0.0000 ug/kg/h | INTRAVENOUS | Status: DC

## 2018-04-01 MED ORDER — SODIUM CHLORIDE 0.45 % IV SOLN
INTRAVENOUS | Status: DC | PRN
  Administered 2018-04-01: 13:00:00 via INTRAVENOUS

## 2018-04-01 MED ORDER — FAMOTIDINE IN NACL 20-0.9 MG/50ML-% IV SOLN
20.0000 mg | Freq: Two times a day (BID) | INTRAVENOUS | Status: DC
  Administered 2018-04-01: 20 mg via INTRAVENOUS

## 2018-04-01 MED ORDER — SODIUM CHLORIDE 0.9 % IV SOLN: 0.0000 ug/min | INTRAVENOUS | Status: DC

## 2018-04-01 MED ORDER — TRAMADOL HCL 50 MG PO TABS
50.0000 mg | ORAL_TABLET | ORAL | Status: DC | PRN
  Administered 2018-04-01 – 2018-04-04 (×4): 100 mg via ORAL
  Filled 2018-04-01 (×4): qty 2

## 2018-04-01 MED ORDER — NITROGLYCERIN IN D5W 200-5 MCG/ML-% IV SOLN
0.0000 ug/min | INTRAVENOUS | Status: DC
  Administered 2018-04-01: 5 ug/min via INTRAVENOUS

## 2018-04-01 MED FILL — Magnesium Sulfate Inj 50%: INTRAMUSCULAR | Qty: 10 | Status: AC

## 2018-04-01 MED FILL — Potassium Chloride Inj 2 mEq/ML: INTRAVENOUS | Qty: 40 | Status: AC

## 2018-04-01 MED FILL — Heparin Sodium (Porcine) Inj 1000 Unit/ML: INTRAMUSCULAR | Qty: 30 | Status: AC

## 2018-04-01 SURGICAL SUPPLY — 106 items
ADAPTER CARDIO PERF ANTE/RETRO (ADAPTER) ×3 IMPLANT
ADAPTER MULTI PERFUSION 15 (ADAPTER) ×2 IMPLANT
ADPR PRFSN 84XANTGRD RTRGD (ADAPTER) ×1
BAG DECANTER FOR FLEXI CONT (MISCELLANEOUS) ×3 IMPLANT
BLADE STERNUM SYSTEM 6 (BLADE) ×3 IMPLANT
BLADE SURG 15 STRL LF DISP TIS (BLADE) ×1 IMPLANT
BLADE SURG 15 STRL SS (BLADE) ×6
CANISTER SUCT 3000ML PPV (MISCELLANEOUS) ×3 IMPLANT
CANN PRFSN 3/8XRT ANG TPR 14 (MISCELLANEOUS) ×1
CANNULA AORTIC ROOT 9FR (CANNULA) ×2 IMPLANT
CANNULA ARTERIAL NVNT 3/8 22FR (MISCELLANEOUS) IMPLANT
CANNULA GUNDRY RCSP 15FR (MISCELLANEOUS) ×2 IMPLANT
CANNULA MC2 2 STG 36/46 NON-V (CANNULA) IMPLANT
CANNULA PRFSN 3/8XRT ANG TPR14 (MISCELLANEOUS) IMPLANT
CANNULA SUMP PERICARDIAL (CANNULA) ×2 IMPLANT
CANNULA VEN MTL TIP RT (MISCELLANEOUS) ×3
CANNULA VENOUS 2 STG 34/46 (CANNULA) ×2
CANNULA VRC MALB SNGL STG 36FR (MISCELLANEOUS) IMPLANT
CATH ROBINSON RED A/P 18FR (CATHETERS) ×2 IMPLANT
CLIP FOGARTY SPRING 6M (CLIP) IMPLANT
CONN 1/2X1/2X1/2  BEN (MISCELLANEOUS) ×2
CONN 1/2X1/2X1/2 BEN (MISCELLANEOUS) ×1 IMPLANT
CONN 3/8X1/2 ST GISH (MISCELLANEOUS) ×8 IMPLANT
CONN Y 3/8X3/8X3/8  BEN (MISCELLANEOUS)
CONN Y 3/8X3/8X3/8 BEN (MISCELLANEOUS) IMPLANT
CONT SPEC 4OZ CLIKSEAL STRL BL (MISCELLANEOUS) ×2 IMPLANT
CRADLE DONUT ADULT HEAD (MISCELLANEOUS) ×3 IMPLANT
DRAIN CHANNEL 32F RND 10.7 FF (WOUND CARE) ×2 IMPLANT
DRAPE CARDIOVASCULAR INCISE (DRAPES) ×3
DRAPE HALF SHEET 40X57 (DRAPES) ×2 IMPLANT
DRAPE SLUSH/WARMER DISC (DRAPES) IMPLANT
DRAPE SRG 135X102X78XABS (DRAPES) ×1 IMPLANT
DRSG AQUACEL AG ADV 3.5X14 (GAUZE/BANDAGES/DRESSINGS) ×2 IMPLANT
DRSG COVADERM 4X14 (GAUZE/BANDAGES/DRESSINGS) ×3 IMPLANT
ELECT CAUTERY BLADE 6.4 (BLADE) IMPLANT
ELECT REM PT RETURN 9FT ADLT (ELECTROSURGICAL) ×6
ELECTRODE REM PT RTRN 9FT ADLT (ELECTROSURGICAL) ×2 IMPLANT
FELT TEFLON 1X6 (MISCELLANEOUS) ×6 IMPLANT
GAUZE SPONGE 4X4 12PLY STRL (GAUZE/BANDAGES/DRESSINGS) ×6 IMPLANT
GAUZE SPONGE 4X4 12PLY STRL LF (GAUZE/BANDAGES/DRESSINGS) ×2 IMPLANT
GLOVE BIOGEL M 6.5 STRL (GLOVE) ×6 IMPLANT
GLOVE BIOGEL M STER SZ 6 (GLOVE) ×6 IMPLANT
GLOVE BIOGEL M STRL SZ7.5 (GLOVE) ×2 IMPLANT
GLOVE BIOGEL PI IND STRL 6 (GLOVE) IMPLANT
GLOVE BIOGEL PI IND STRL 6.5 (GLOVE) IMPLANT
GLOVE BIOGEL PI IND STRL 7.0 (GLOVE) IMPLANT
GLOVE BIOGEL PI INDICATOR 6 (GLOVE) ×2
GLOVE BIOGEL PI INDICATOR 6.5 (GLOVE) ×2
GLOVE BIOGEL PI INDICATOR 7.0 (GLOVE) ×6
GLOVE SURG SIGNA 7.5 PF LTX (GLOVE) IMPLANT
GOWN STRL REUS W/ TWL LRG LVL3 (GOWN DISPOSABLE) ×4 IMPLANT
GOWN STRL REUS W/TWL LRG LVL3 (GOWN DISPOSABLE) ×18
HEMOSTAT POWDER SURGIFOAM 1G (HEMOSTASIS) IMPLANT
HEMOSTAT SURGICEL 2X14 (HEMOSTASIS) IMPLANT
INSERT FOGARTY XLG (MISCELLANEOUS) IMPLANT
KIT BASIN OR (CUSTOM PROCEDURE TRAY) ×3 IMPLANT
KIT SUCTION CATH 14FR (SUCTIONS) ×3 IMPLANT
KIT TURNOVER KIT B (KITS) ×3 IMPLANT
LINE VENT (MISCELLANEOUS) ×2 IMPLANT
LOOP VESSEL SUPERMAXI WHITE (MISCELLANEOUS) ×2 IMPLANT
NS IRRIG 1000ML POUR BTL (IV SOLUTION) ×12 IMPLANT
PACK OPEN HEART (CUSTOM PROCEDURE TRAY) ×3 IMPLANT
PAD ARMBOARD 7.5X6 YLW CONV (MISCELLANEOUS) ×6 IMPLANT
RING ANLPLS CARP-EDW PHY II 30 (Prosthesis & Implant Heart) IMPLANT
RING ANNULOPLASTY PHY II (Prosthesis & Implant Heart) ×3 IMPLANT
SET CARDIOPLEGIA MPS 5001102 (MISCELLANEOUS) ×2 IMPLANT
SUCKER WEIGHTED FLEX (MISCELLANEOUS) ×3 IMPLANT
SUT ETHIBOND (SUTURE) ×4 IMPLANT
SUT ETHIBOND 2 0 SH (SUTURE) ×15 IMPLANT
SUT ETHIBOND 2 0 SH 36X2 (SUTURE) ×2 IMPLANT
SUT ETHIBOND 2 0 V4 (SUTURE) IMPLANT
SUT ETHIBOND 2 0V4 GREEN (SUTURE) IMPLANT
SUT ETHIBOND 2-0 RB-1 WHT (SUTURE) ×4 IMPLANT
SUT PROLENE 3 0 SH DA (SUTURE) ×4 IMPLANT
SUT PROLENE 3 0 SH1 36 (SUTURE) ×3 IMPLANT
SUT PROLENE 4 0 RB 1 (SUTURE) ×45
SUT PROLENE 4 0 SH DA (SUTURE) ×6 IMPLANT
SUT PROLENE 4-0 RB1 .5 CRCL 36 (SUTURE) ×2 IMPLANT
SUT PROLENE 5 0 C 1 36 (SUTURE) ×6 IMPLANT
SUT PROLENE 5 0 CC1 (SUTURE) ×3 IMPLANT
SUT SILK  1 MH (SUTURE) ×4
SUT SILK 1 MH (SUTURE) ×2 IMPLANT
SUT SILK 1 TIES 10X30 (SUTURE) ×3 IMPLANT
SUT SILK 2 0 (SUTURE) ×3
SUT SILK 2 0 SH CR/8 (SUTURE) ×6 IMPLANT
SUT SILK 2-0 18XBRD TIE 12 (SUTURE) ×1 IMPLANT
SUT SILK 3 0 SH CR/8 (SUTURE) ×3 IMPLANT
SUT SILK 4 0 (SUTURE) ×3
SUT SILK 4-0 18XBRD TIE 12 (SUTURE) ×1 IMPLANT
SUT STEEL 6MS V (SUTURE) IMPLANT
SUT TEM PAC WIRE 2 0 SH (SUTURE) ×12 IMPLANT
SUT VIC AB 1 CTX 27 (SUTURE) ×4 IMPLANT
SUT VIC AB 1 CTX 36 (SUTURE) ×3
SUT VIC AB 1 CTX36XBRD ANBCTR (SUTURE) IMPLANT
SUT VIC AB 2-0 CTX 27 (SUTURE) IMPLANT
SUT VIC AB 3-0 X1 27 (SUTURE) IMPLANT
SYSTEM SAHARA CHEST DRAIN ATS (WOUND CARE) ×3 IMPLANT
SYSTEM SAHARA CHEST DRAIN RE-I (WOUND CARE) ×2 IMPLANT
TAPE CLOTH SURG 4X10 WHT LF (GAUZE/BANDAGES/DRESSINGS) ×2 IMPLANT
TOWEL GREEN STERILE (TOWEL DISPOSABLE) ×3 IMPLANT
TOWEL GREEN STERILE FF (TOWEL DISPOSABLE) ×3 IMPLANT
TRAY FOLEY SILVER 16FR TEMP (SET/KITS/TRAYS/PACK) ×3 IMPLANT
TUBE SUCT INTRACARD DLP 20F (MISCELLANEOUS) ×4 IMPLANT
UNDERPAD 30X30 (UNDERPADS AND DIAPERS) ×3 IMPLANT
VRC MALLEABLE SINGLE STG 36FR (MISCELLANEOUS) ×3
WATER STERILE IRR 1000ML POUR (IV SOLUTION) ×6 IMPLANT

## 2018-04-01 NOTE — Brief Op Note (Signed)
03/27/2018 - 04/01/2018  1:47 PM  PATIENT:  Jacqueline [name]  67 y.o. female  PRE-OPERATIVE DIAGNOSIS:  left atrial myxoma  POST-OPERATIVE DIAGNOSIS:  left atrial myxoma. Mitral insufficiency  PROCEDURE:  Procedure(s) with comments: RESECTION OF LEFT ATRIAL MYXOMA (N/A) TRANSESOPHAGEAL ECHOCARDIOGRAM (TEE) (N/A) MITRAL VALVE REPAIR. (N/A) - Mitral valve annuloplasty     SURGEON:  Surgeon(s) and Role:    * Melrose Nakayama, MD - Primary  PHYSICIAN ASSISTANT:  Nicholes Rough, PA-C   ANESTHESIA:   general  EBL:  400 mL   BLOOD ADMINISTERED:none  DRAINS: ROUTINE   LOCAL MEDICATIONS USED:  NONE  SPECIMEN:  Source of Specimen:  LEFT ATRIAL MYXOMA  DISPOSITION OF SPECIMEN:  PATHOLOGY  COUNTS:  YES  TOURNIQUET:  * No tourniquets in log *  DICTATION: .Dragon Dictation  PLAN OF CARE: Admit to inpatient   PATIENT DISPOSITION:  ICU - intubated and hemodynamically stable.   Delay start of Pharmacological VTE agent (>24hrs) due to surgical blood loss or risk of bleeding: yes

## 2018-04-01 NOTE — Plan of Care (Signed)
  Problem: Clinical Measurements: Goal: Respiratory complications will improve Outcome: Progressing Note:  Patient extubated within 6 hour window.

## 2018-04-01 NOTE — Procedures (Signed)
Extubation Procedure Note  Patient Details:   Name: Jacqueline [name] DOB: Mar 12, 1951 MRN: [id number]   Airway Documentation:     Evaluation  O2 sats: stable throughout Complications: No apparent complications Patient did tolerate procedure well. Bilateral Breath Sounds: Clear, Diminished   Yes Pt extubated to 4L [place] per rapid wean protocol. RN at bedside. NARD VSS, Pt able to speak, no stridor noted. Will cont to monitor.   VC 0.9 NIF -20 IS 1000 Lottie Rater 04/01/2018, 5:53 PM

## 2018-04-01 NOTE — Transfer of Care (Signed)
Immediate Anesthesia Transfer of Care Note  Patient: Jacqueline [name]  Procedure(s) Performed: RESECTION OF LEFT ATRIAL MYXOMA (N/A ) TRANSESOPHAGEAL ECHOCARDIOGRAM (TEE) (N/A ) MITRAL VALVE REPAIR. (N/A )  Patient Location: ICU  Anesthesia Type:General  Level of Consciousness: sedated and Patient remains intubated per anesthesia plan  Airway & Oxygen Therapy: Patient remains intubated per anesthesia plan and Patient placed on Ventilator (see vital sign flow sheet for setting)  Post-op Assessment: Report given to RN and Post -op Vital signs reviewed and stable  Post vital signs: Reviewed and stable  Last Vitals:  Vitals Value Taken Time  BP    Temp    Pulse    Resp 12 04/01/2018  1:22 PM  SpO2    Vitals shown include unvalidated device data.  Last Pain:  Vitals:   04/01/18 0530  TempSrc: Oral  PainSc: 0-No pain         Complications: No apparent anesthesia complications   Patient transported to ICU with standard monitors (HR, BP, SPO2, RR) and emergency drugs/equipment. Controlled ventilation maintained via ambu bag. Report given to bedside RN and respiratory therapist. Pt connected to ICU monitor and ventilator. All questions answered and vital signs stable before leaving

## 2018-04-01 NOTE — Anesthesia Procedure Notes (Signed)
Central Venous Catheter Insertion Performed by: Roberts Gaudy, MD, anesthesiologist Start/End4/10/2018 7:10 AM, 04/01/2018 7:15 AM Patient location: Pre-op. Preanesthetic checklist: patient identified, IV checked, site marked, risks and benefits discussed, surgical consent, monitors and equipment checked, pre-op evaluation, timeout performed and anesthesia consent Hand hygiene performed  and maximum sterile barriers used  PA cath was placed.Swan type:thermodilution Procedure performed without using ultrasound guided technique. Attempts: 1 Patient tolerated the procedure well with no immediate complications.

## 2018-04-01 NOTE — Anesthesia Procedure Notes (Signed)
Arterial Line Insertion Start/End4/10/2018 7:28 AM Performed by: Julieta Bellini, CRNA, CRNA  Patient location: Pre-op. Preanesthetic checklist: patient identified, IV checked, site marked, risks and benefits discussed, surgical consent, monitors and equipment checked, pre-op evaluation, timeout performed and anesthesia consent Lidocaine 1% used for infiltration Left, radial was placed Catheter size: 20 G Hand hygiene performed  and maximum sterile barriers used   Attempts: 1 Procedure performed without using ultrasound guided technique. Following insertion, dressing applied and Biopatch. Post procedure assessment: normal  Patient tolerated the procedure well with no immediate complications.

## 2018-04-01 NOTE — Progress Notes (Signed)
Patient in ICU and intubated. Cardiothoracic surgery to manage medically at this point.   We are available for any further questions or medical problems that may arise.  Velvet Bathe, MD

## 2018-04-01 NOTE — Progress Notes (Signed)
RT note-Attempted to wean, too sleepy yet, patient remains on rate of 12.

## 2018-04-01 NOTE — Anesthesia Procedure Notes (Signed)
Procedure Name: Intubation Date/Time: 04/01/2018 8:14 AM Performed by: Julieta Bellini, CRNA Pre-anesthesia Checklist: Patient identified, Emergency Drugs available, Suction available and Patient being monitored Patient Re-evaluated:Patient Re-evaluated prior to induction Oxygen Delivery Method: Circle system utilized Preoxygenation: Pre-oxygenation with 100% oxygen Induction Type: IV induction Ventilation: Mask ventilation without difficulty Laryngoscope Size: Mac and 3 Grade View: Grade I Tube type: Oral Tube size: 7.5 mm Number of attempts: 1 Airway Equipment and Method: Stylet Placement Confirmation: ETT inserted through vocal cords under direct vision,  positive ETCO2 and breath sounds checked- equal and bilateral Secured at: 22 cm Tube secured with: Tape Dental Injury: Teeth and Oropharynx as per pre-operative assessment

## 2018-04-01 NOTE — Progress Notes (Signed)
CT surgery p.m. Rounds  Patient extubated neuro intact after resection of left atrial myxoma Intrinsic rhythm 62/min sinus Minimal chest tube drainage

## 2018-04-01 NOTE — Interval H&P Note (Signed)
History and Physical Interval Note:  04/01/2018 7:46 AM  Jacqueline [name]  has presented today for surgery, with the diagnosis of left atrial myxoma  The various methods of treatment have been discussed with the patient and family. After consideration of risks, benefits and other options for treatment, the patient has consented to  Procedure(s): RESECTION OF LEFT ATRIAL MYXOMA (N/A) TRANSESOPHAGEAL ECHOCARDIOGRAM (TEE) (N/A) as a surgical intervention .  The patient's history has been reviewed, patient examined, no change in status, stable for surgery.  I have reviewed the patient's chart and labs.  Questions were answered to the patient's satisfaction.     Melrose Nakayama

## 2018-04-01 NOTE — Anesthesia Procedure Notes (Signed)
Central Venous Catheter Insertion Performed by: anesthesiologist Start/End4/10/2018 7:10 AM, 04/01/2018 7:15 AM Patient location: Pre-op. Preanesthetic checklist: patient identified, IV checked, site marked, risks and benefits discussed, surgical consent, monitors and equipment checked, pre-op evaluation, timeout performed and anesthesia consent Lidocaine 1% used for infiltration and patient sedated Hand hygiene performed  and maximum sterile barriers used  Catheter size: 9 Fr Sheath introducer Procedure performed using ultrasound guided technique. Ultrasound Notes:anatomy identified, needle tip was noted to be adjacent to the nerve/plexus identified, no ultrasound evidence of intravascular and/or intraneural injection and image(s) printed for medical record Attempts: 1 Following insertion, line sutured and dressing applied. Post procedure assessment: blood return through all ports, free fluid flow and no air  Patient tolerated the procedure well with no immediate complications.

## 2018-04-02 ENCOUNTER — Inpatient Hospital Stay (HOSPITAL_COMMUNITY): Payer: Medicaid Other

## 2018-04-02 ENCOUNTER — Encounter (HOSPITAL_COMMUNITY): Payer: Self-pay | Admitting: Thoracic Surgery (Cardiothoracic Vascular Surgery)

## 2018-04-02 DIAGNOSIS — Z9889 Other specified postprocedural states: Secondary | ICD-10-CM

## 2018-04-02 LAB — BASIC METABOLIC PANEL
Anion gap: 7 (ref 5–15)
BUN: 9 mg/dL (ref 6–20)
CALCIUM: 7.8 mg/dL — AB (ref 8.9–10.3)
CO2: 23 mmol/L (ref 22–32)
Chloride: 105 mmol/L (ref 101–111)
Creatinine, Ser: 0.59 mg/dL (ref 0.44–1.00)
Glucose, Bld: 122 mg/dL — ABNORMAL HIGH (ref 65–99)
Potassium: 4.2 mmol/L (ref 3.5–5.1)
Sodium: 135 mmol/L (ref 135–145)

## 2018-04-02 LAB — POCT I-STAT, CHEM 8
BUN: 7 mg/dL (ref 6–20)
BUN: 6 mg/dL (ref 6–20)
BUN: 4 mg/dL — AB (ref 6–20)
BUN: 6 mg/dL (ref 6–20)
BUN: 6 mg/dL (ref 6–20)
BUN: 6 mg/dL (ref 6–20)
BUN: 14 mg/dL (ref 6–20)
CALCIUM ION: 1.18 mmol/L (ref 1.15–1.40)
CALCIUM ION: 1.08 mmol/L — AB (ref 1.15–1.40)
CALCIUM ION: 0.97 mmol/L — AB (ref 1.15–1.40)
CALCIUM ION: 1.05 mmol/L — AB (ref 1.15–1.40)
CALCIUM ION: 1.2 mmol/L (ref 1.15–1.40)
CHLORIDE: 101 mmol/L (ref 101–111)
CHLORIDE: 99 mmol/L — AB (ref 101–111)
CHLORIDE: 103 mmol/L (ref 101–111)
CREATININE: 0.5 mg/dL (ref 0.44–1.00)
CREATININE: 0.5 mg/dL (ref 0.44–1.00)
CREATININE: 0.4 mg/dL — AB (ref 0.44–1.00)
CREATININE: 0.4 mg/dL — AB (ref 0.44–1.00)
CREATININE: 0.7 mg/dL (ref 0.44–1.00)
Calcium, Ion: 1.23 mmol/L (ref 1.15–1.40)
Calcium, Ion: 0.86 mmol/L — CL (ref 1.15–1.40)
Chloride: 97 mmol/L — ABNORMAL LOW (ref 101–111)
Chloride: 101 mmol/L (ref 101–111)
Chloride: 102 mmol/L (ref 101–111)
Chloride: 99 mmol/L — ABNORMAL LOW (ref 101–111)
Creatinine, Ser: 0.3 mg/dL — ABNORMAL LOW (ref 0.44–1.00)
Creatinine, Ser: 0.4 mg/dL — ABNORMAL LOW (ref 0.44–1.00)
GLUCOSE: 97 mg/dL (ref 65–99)
GLUCOSE: 151 mg/dL — AB (ref 65–99)
GLUCOSE: 135 mg/dL — AB (ref 65–99)
GLUCOSE: 148 mg/dL — AB (ref 65–99)
GLUCOSE: 110 mg/dL — AB (ref 65–99)
Glucose, Bld: 93 mg/dL (ref 65–99)
Glucose, Bld: 132 mg/dL — ABNORMAL HIGH (ref 65–99)
HCT: 21 % — ABNORMAL LOW (ref 36.0–46.0)
HCT: 20 % — ABNORMAL LOW (ref 36.0–46.0)
HCT: 25 % — ABNORMAL LOW (ref 36.0–46.0)
HCT: 26 % — ABNORMAL LOW (ref 36.0–46.0)
HEMATOCRIT: 30 % — AB (ref 36.0–46.0)
HEMATOCRIT: 28 % — AB (ref 36.0–46.0)
HEMATOCRIT: 23 % — AB (ref 36.0–46.0)
HEMOGLOBIN: 8.5 g/dL — AB (ref 12.0–15.0)
Hemoglobin: 10.2 g/dL — ABNORMAL LOW (ref 12.0–15.0)
Hemoglobin: 9.5 g/dL — ABNORMAL LOW (ref 12.0–15.0)
Hemoglobin: 7.8 g/dL — ABNORMAL LOW (ref 12.0–15.0)
Hemoglobin: 7.1 g/dL — ABNORMAL LOW (ref 12.0–15.0)
Hemoglobin: 6.8 g/dL — CL (ref 12.0–15.0)
Hemoglobin: 8.8 g/dL — ABNORMAL LOW (ref 12.0–15.0)
POTASSIUM: 3.8 mmol/L (ref 3.5–5.1)
POTASSIUM: 3.5 mmol/L (ref 3.5–5.1)
Potassium: 3.3 mmol/L — ABNORMAL LOW (ref 3.5–5.1)
Potassium: 4.4 mmol/L (ref 3.5–5.1)
Potassium: 4.5 mmol/L (ref 3.5–5.1)
Potassium: 4.8 mmol/L (ref 3.5–5.1)
Potassium: 3.9 mmol/L (ref 3.5–5.1)
SODIUM: 140 mmol/L (ref 135–145)
SODIUM: 139 mmol/L (ref 135–145)
Sodium: 141 mmol/L (ref 135–145)
Sodium: 140 mmol/L (ref 135–145)
Sodium: 136 mmol/L (ref 135–145)
Sodium: 140 mmol/L (ref 135–145)
Sodium: 137 mmol/L (ref 135–145)
TCO2: 31 mmol/L (ref 22–32)
TCO2: 28 mmol/L (ref 22–32)
TCO2: 30 mmol/L (ref 22–32)
TCO2: 31 mmol/L (ref 22–32)
TCO2: 27 mmol/L (ref 22–32)
TCO2: 30 mmol/L (ref 22–32)
TCO2: 27 mmol/L (ref 22–32)

## 2018-04-02 LAB — CBC
HCT: 27.5 % — ABNORMAL LOW (ref 36.0–46.0)
HEMATOCRIT: 26.3 % — AB (ref 36.0–46.0)
Hemoglobin: 8.9 g/dL — ABNORMAL LOW (ref 12.0–15.0)
Hemoglobin: 9.1 g/dL — ABNORMAL LOW (ref 12.0–15.0)
MCH: 30.2 pg (ref 26.0–34.0)
MCH: 29.6 pg (ref 26.0–34.0)
MCHC: 33.8 g/dL (ref 30.0–36.0)
MCHC: 33.1 g/dL (ref 30.0–36.0)
MCV: 89.2 fL (ref 78.0–100.0)
MCV: 89.6 fL (ref 78.0–100.0)
PLATELETS: 151 10*3/uL (ref 150–400)
PLATELETS: 139 10*3/uL — AB (ref 150–400)
RBC: 2.95 MIL/uL — ABNORMAL LOW (ref 3.87–5.11)
RBC: 3.07 MIL/uL — AB (ref 3.87–5.11)
RDW: 14.3 % (ref 11.5–15.5)
RDW: 14.2 % (ref 11.5–15.5)
WBC: 11.4 10*3/uL — ABNORMAL HIGH (ref 4.0–10.5)
WBC: 11 10*3/uL — AB (ref 4.0–10.5)

## 2018-04-02 LAB — PREPARE PLATELET PHERESIS
UNIT DIVISION: 0
UNIT DIVISION: 0

## 2018-04-02 LAB — POCT I-STAT 3, ART BLOOD GAS (G3+)
ACID-BASE EXCESS: 5 mmol/L — AB (ref 0.0–2.0)
ACID-BASE EXCESS: 6 mmol/L — AB (ref 0.0–2.0)
Acid-Base Excess: 3 mmol/L — ABNORMAL HIGH (ref 0.0–2.0)
BICARBONATE: 30.9 mmol/L — AB (ref 20.0–28.0)
BICARBONATE: 28.8 mmol/L — AB (ref 20.0–28.0)
Bicarbonate: 29.3 mmol/L — ABNORMAL HIGH (ref 20.0–28.0)
O2 SAT: 100 %
O2 SAT: 100 %
O2 Saturation: 100 %
PCO2 ART: 49.5 mmHg — AB (ref 32.0–48.0)
PH ART: 7.328 — AB (ref 7.350–7.450)
PO2 ART: 364 mmHg — AB (ref 83.0–108.0)
PO2 ART: 275 mmHg — AB (ref 83.0–108.0)
TCO2: 32 mmol/L (ref 22–32)
TCO2: 30 mmol/L (ref 22–32)
TCO2: 31 mmol/L (ref 22–32)
pCO2 arterial: 25.7 mmHg — ABNORMAL LOW (ref 32.0–48.0)
pCO2 arterial: 56 mmHg — ABNORMAL HIGH (ref 32.0–48.0)
pH, Arterial: 7.402 (ref 7.350–7.450)
pH, Arterial: 7.659 (ref 7.350–7.450)
pO2, Arterial: 369 mmHg — ABNORMAL HIGH (ref 83.0–108.0)

## 2018-04-02 LAB — CREATININE, SERUM
CREATININE: 0.74 mg/dL (ref 0.44–1.00)
GFR calc Af Amer: >60 mL/min (ref >60)

## 2018-04-02 LAB — GLUCOSE, CAPILLARY
GLUCOSE-CAPILLARY: 106 mg/dL — AB (ref 65–99)
GLUCOSE-CAPILLARY: 108 mg/dL — AB (ref 65–99)
GLUCOSE-CAPILLARY: 132 mg/dL — AB (ref 65–99)
Glucose-Capillary: 120 mg/dL — ABNORMAL HIGH (ref 65–99)
Glucose-Capillary: 115 mg/dL — ABNORMAL HIGH (ref 65–99)
Glucose-Capillary: 115 mg/dL — ABNORMAL HIGH (ref 65–99)
Glucose-Capillary: 115 mg/dL — ABNORMAL HIGH (ref 65–99)

## 2018-04-02 LAB — BPAM PLATELET PHERESIS
BLOOD PRODUCT EXPIRATION DATE: 201904112359
BLOOD PRODUCT EXPIRATION DATE: 201904122359
ISSUE DATE / TIME: 201904111153
ISSUE DATE / TIME: 201904111518
UNIT TYPE AND RH: 2800
UNIT TYPE AND RH: 5100

## 2018-04-02 LAB — MAGNESIUM
MAGNESIUM: 2.4 mg/dL (ref 1.7–2.4)
MAGNESIUM: 2.2 mg/dL (ref 1.7–2.4)

## 2018-04-02 MED ORDER — ENOXAPARIN SODIUM 30 MG/0.3ML ~~LOC~~ SOLN
30.0000 mg | Freq: Every day | SUBCUTANEOUS | Status: DC
  Administered 2018-04-02 – 2018-04-05 (×4): 30 mg via SUBCUTANEOUS
  Filled 2018-04-02 (×4): qty 0.3

## 2018-04-02 MED ORDER — INSULIN ASPART 100 UNIT/ML ~~LOC~~ SOLN: 0.0000 [IU] | SUBCUTANEOUS | Status: DC

## 2018-04-02 MED ORDER — FUROSEMIDE 10 MG/ML IJ SOLN
40.0000 mg | Freq: Once | INTRAMUSCULAR | Status: AC
  Administered 2018-04-02: 40 mg via INTRAVENOUS
  Filled 2018-04-02: qty 4

## 2018-04-02 MED FILL — Lidocaine HCl IV Inj 20 MG/ML: INTRAVENOUS | Qty: 5 | Status: AC

## 2018-04-02 MED FILL — Mannitol IV Soln 20%: INTRAVENOUS | Qty: 500 | Status: AC

## 2018-04-02 MED FILL — Sodium Chloride IV Soln 0.9%: INTRAVENOUS | Qty: 2000 | Status: AC

## 2018-04-02 MED FILL — Electrolyte-R (PH 7.4) Solution: INTRAVENOUS | Qty: 5000 | Status: AC

## 2018-04-02 MED FILL — Sodium Bicarbonate IV Soln 8.4%: INTRAVENOUS | Qty: 50 | Status: AC

## 2018-04-02 MED FILL — Heparin Sodium (Porcine) Inj 1000 Unit/ML: INTRAMUSCULAR | Qty: 30 | Status: AC

## 2018-04-02 NOTE — Progress Notes (Signed)
Patient ID: Jacqueline [name], female   DOB: May 15, 1951, 67 y.o.   MRN: [id number] [facility] DAILY ICU PROGRESS NOTE                   [address]            [place] [address]          [phone]   1 Day Post-Op Procedure(s) (LRB): RESECTION OF LEFT ATRIAL MYXOMA (N/A) TRANSESOPHAGEAL ECHOCARDIOGRAM (TEE) (N/A) MITRAL VALVE REPAIR. (N/A)  Total Length of Stay:  LOS: 5 days   Subjective: Patient awake alert neurologically intact extubated last night currently in sinus rhythm  Objective: Vital signs in last 24 hours: Temp:  [96.8 F (36 C)-99 F (37.2 C)] 99 F (37.2 C) (04/12 0757) Pulse Rate:  [62-73] 73 (04/12 0400) Cardiac Rhythm: Normal sinus rhythm (04/12 0400) Resp:  [9-23] 17 (04/12 0700) BP: (91-158)/(62-96) 109/82 (04/12 0700) SpO2:  [95 %-100 %] 96 % (04/12 0700) Arterial Line BP: (93-164)/(52-86) 132/71 (04/12 0700) FiO2 (%):  [40 %-50 %] 40 % (04/11 1605) Weight:  [125 lb 3.5 oz (56.8 kg)] 125 lb 3.5 oz (56.8 kg) (04/12 0515)  Filed Weights   03/27/18 0710 03/27/18 1827 04/02/18 0515  Weight: 125 lb (56.7 kg) 118 lb 12.8 oz (53.9 kg) 125 lb 3.5 oz (56.8 kg)    Weight change:    Hemodynamic parameters for last 24 hours: PAP: (17-38)/(2-25) 23/14 CO:  [3.4 L/min-5.1 L/min] 5.1 L/min CI:  [2.2 L/min/m2-3.3 L/min/m2] 3.3 L/min/m2  Intake/Output from previous day: 04/11 0701 - 04/12 0700 In: 7189.1 [I.V.:4911.1; Blood:778; IV Piggyback:1500] Out: 5956 [Urine:4505; Blood:400; Chest Tube:370]  Intake/Output this shift: No intake/output data recorded.  Current Meds: Scheduled Meds: . acetaminophen  1,000 mg Oral Q6H   Or  . acetaminophen (TYLENOL) oral liquid 160 mg/5 mL  1,000 mg Per Tube Q6H  . atorvastatin  40 mg Oral q1800  . bisacodyl  10 mg Oral Daily   Or  . bisacodyl  10 mg Rectal Daily  . Chlorhexidine Gluconate Cloth  6 each Topical Daily  . docusate sodium  200 mg Oral Daily  . FLUoxetine  40 mg Oral Daily  . gabapentin  600  mg Oral TID  . insulin aspart  0-24 Units Subcutaneous Q4H  . mouth rinse  15 mL Mouth Rinse BID  . metoprolol tartrate  12.5 mg Oral BID   Or  . metoprolol tartrate  12.5 mg Per Tube BID  . [START ON 04/03/2018] pantoprazole  40 mg Oral Daily  . sodium chloride flush  10-40 mL Intracatheter Q12H  . sodium chloride flush  3 mL Intravenous Q12H   Continuous Infusions: . sodium chloride 10 mL/hr at 04/01/18 2000  . sodium chloride    . sodium chloride Stopped (04/02/18 0200)  . albumin human    .  ceFAZolin (ANCEF) IV Stopped (04/02/18 3875)  . dexmedetomidine (PRECEDEX) IV infusion Stopped (04/01/18 2030)  . lactated ringers    . lactated ringers 20 mL/hr at 04/01/18 2000  . lactated ringers Stopped (04/02/18 0200)  . nitroGLYCERIN 40 mcg/min (04/02/18 0630)  . phenylephrine (NEO-SYNEPHRINE) Adult infusion 0 mcg/min (04/01/18 1851)   PRN Meds:.sodium chloride, albumin human, lactated ringers, levalbuterol, metoprolol tartrate, midazolam, morphine injection, ondansetron (ZOFRAN) IV, oxyCODONE, sodium chloride flush, sodium chloride flush, traMADol  General appearance: alert and cooperative Neurologic: intact Heart: regular rate and rhythm, S1, S2 normal, no murmur, click, rub or gallop Lungs: diminished breath sounds bibasilar Abdomen: soft,  non-tender; bowel sounds normal; no masses,  no organomegaly Extremities: extremities normal, atraumatic, no cyanosis or edema and Homans sign is negative, no sign of DVT Wound: Incisions intact  Lab Results: CBC: Recent Labs    04/01/18 2016 04/02/18 0344  WBC 10.3 11.4*  HGB 10.2* 8.9*  HCT 30.3* 26.3*  PLT 129* 151   BMET:  Recent Labs    04/01/18 0440  04/01/18 2007 04/01/18 2016 04/02/18 0344  NA 139   < > 142  --  135  K 3.7   < > 3.5  --  4.2  CL 103  --  103  --  105  CO2 26  --   --   --  23  GLUCOSE 96   < > 105*  --  122*  BUN 9  --  7  --  9  CREATININE 0.65  --  0.40* 0.48 0.59  CALCIUM 8.8*  --   --   --  7.8*     < > = values in this interval not displayed.    CMET: Lab Results  Component Value Date   WBC 11.4 (H) 04/02/2018   HGB 8.9 (L) 04/02/2018   HCT 26.3 (L) 04/02/2018   PLT 151 04/02/2018   GLUCOSE 122 (H) 04/02/2018   CHOL 204 (H) 03/31/2018   TRIG 55 03/31/2018   HDL 60 03/31/2018   LDLCALC 133 (H) 03/31/2018   ALT 9 (L) 03/27/2018   AST 17 03/27/2018   NA 135 04/02/2018   K 4.2 04/02/2018   CL 105 04/02/2018   CREATININE 0.59 04/02/2018   BUN 9 04/02/2018   CO2 23 04/02/2018   TSH 1.694 03/27/2018   INR 1.34 04/01/2018   HGBA1C 5.5 03/27/2018      PT/INR:  Recent Labs    04/01/18 1316  LABPROT 16.5*  INR 1.34   Radiology: Dg Chest Port 1 View  Result Date: 04/01/2018 CLINICAL DATA:  Post-op RESECTION OF LEFT ATRIAL MYXOMA (N/A)TRANSESOPHAGEAL ECHOCARDIOGRAM (TEE) (N/A)MITRAL VALVE REPAIR. (N/A) - Mitral valve annuloplasty EXAM: PORTABLE CHEST 1 VIEW COMPARISON:  03/27/2018 FINDINGS: Since prior exam, cardiac surgery has been performed. The cardiac silhouette is mildly enlarged. New bowel is evident. There is no mediastinal widening. There is opacity in the right lower lung consistent with atelectasis. No pulmonary edema. No pleural effusion or pneumothorax. Endotracheal tube, nasal/orogastric tube, right internal jugular Swan-Ganz catheter, mediastinal tube and right chest tube are well positioned. IMPRESSION: 1. No evidence of an operative complication. No mediastinal widening, pulmonary edema or pneumothorax. 2. Mild right lung base opacity consistent with atelectasis. 3. Support apparatus is well positioned. Electronically Signed   By: Lajean Manes M.D.   On: 04/01/2018 13:57     Assessment/Plan: S/P Procedure(s) (LRB): RESECTION OF LEFT ATRIAL MYXOMA (N/A) TRANSESOPHAGEAL ECHOCARDIOGRAM (TEE) (N/A) MITRAL VALVE REPAIR. (N/A) Mobilize Diuresis d/c tubes/lines See progression orders Expected blood loss anemia    [name] 04/02/2018 8:07 AM

## 2018-04-02 NOTE — Progress Notes (Signed)
Progress Note  Patient Name: Jacqueline [name] Date of Encounter: 04/02/2018  Primary Cardiologist: Mertie Moores, MD   Subjective   67 year old female who was admitted with episodes of presyncope and weakness.  She was found to have a large atrial myxoma.  Patient had removal of her atrial myxoma yesterday.  She also was found to have significant mitral regurgitation after the myxoma was removed.  She required mitral valve repair.  She seems to be doing well this morning.  She has maintained normal sinus rhythm.  Inpatient Medications    Scheduled Meds: . acetaminophen  1,000 mg Oral Q6H   Or  . acetaminophen (TYLENOL) oral liquid 160 mg/5 mL  1,000 mg Per Tube Q6H  . atorvastatin  40 mg Oral q1800  . bisacodyl  10 mg Oral Daily   Or  . bisacodyl  10 mg Rectal Daily  . Chlorhexidine Gluconate Cloth  6 each Topical Daily  . docusate sodium  200 mg Oral Daily  . enoxaparin (LOVENOX) injection  30 mg Subcutaneous QHS  . FLUoxetine  40 mg Oral Daily  . furosemide  40 mg Intravenous Once  . gabapentin  600 mg Oral TID  . insulin aspart  0-24 Units Subcutaneous Q4H  . mouth rinse  15 mL Mouth Rinse BID  . metoprolol tartrate  12.5 mg Oral BID   Or  . metoprolol tartrate  12.5 mg Per Tube BID  . [START ON 04/03/2018] pantoprazole  40 mg Oral Daily  . sodium chloride flush  10-40 mL Intracatheter Q12H  . sodium chloride flush  3 mL Intravenous Q12H   Continuous Infusions: . sodium chloride 10 mL/hr at 04/01/18 2000  . sodium chloride    . sodium chloride Stopped (04/02/18 0200)  . albumin human    .  ceFAZolin (ANCEF) IV Stopped (04/02/18 6269)  . dexmedetomidine (PRECEDEX) IV infusion Stopped (04/01/18 2030)  . lactated ringers    . lactated ringers 20 mL/hr at 04/01/18 2000  . lactated ringers Stopped (04/02/18 0200)  . nitroGLYCERIN 40 mcg/min (04/02/18 0630)  . phenylephrine (NEO-SYNEPHRINE) Adult infusion 0 mcg/min (04/01/18 1851)   PRN Meds: sodium chloride, albumin  human, lactated ringers, levalbuterol, metoprolol tartrate, midazolam, morphine injection, ondansetron (ZOFRAN) IV, oxyCODONE, sodium chloride flush, sodium chloride flush, traMADol   Vital Signs    Vitals:   04/02/18 0630 04/02/18 0645 04/02/18 0700 04/02/18 0757  BP:  (!) 109/96 109/82   Pulse:      Resp: 14 13 17    Temp: 99 F (37.2 C) 98.8 F (37.1 C) 98.8 F (37.1 C) 99 F (37.2 C)  TempSrc:    Core  SpO2: 98% 97% 96%   Weight:      Height:        Intake/Output Summary (Last 24 hours) at 04/02/2018 0908 Last data filed at 04/02/2018 0700 Gross per 24 hour  Intake 7189.14 ml  Output 5075 ml  Net 2114.14 ml   Filed Weights   03/27/18 0710 03/27/18 1827 04/02/18 0515  Weight: 125 lb (56.7 kg) 118 lb 12.8 oz (53.9 kg) 125 lb 3.5 oz (56.8 kg)    Telemetry    NSR  - Personally Reviewed  ECG     NSR  - Personally Reviewed  Physical Exam   GEN:  Chronically ill-appearing female.  She appears to be older than her stated age. Neck: No JVD Cardiac: RRR, soft systolic murmur. Respiratory: Clear to auscultation bilaterally. GI: Soft, nontender, non-distended  MS: No edema; No deformity.  Neuro:  Nonfocal  Psych: Normal affect   Labs    Chemistry Recent Labs  Lab 03/27/18 0742  03/30/18 0401 04/01/18 0440 04/01/18 1326 04/01/18 2007 04/01/18 2016 04/02/18 0344  NA 137   < > 138 139 142 142  --  135  K 3.4*   < > 3.7 3.7 4.2 3.5  --  4.2  CL 98*   < > 103 103  --  103  --  105  CO2 29   < > 26 26  --   --   --  23  GLUCOSE 93   < > 91 96 136* 105*  --  122*  BUN 8   < > 7 9  --  7  --  9  CREATININE 0.81   < > 0.62 0.65  --  0.40* 0.48 0.59  CALCIUM 8.8*   < > 9.0 8.8*  --   --   --  7.8*  PROT 6.9  --   --   --   --   --   --   --   ALBUMIN 3.8  --   --   --   --   --   --   --   AST 17  --   --   --   --   --   --   --   ALT 9*  --   --   --   --   --   --   --   ALKPHOS 74  --   --   --   --   --   --   --   BILITOT 0.4  --   --   --   --   --   --    --   GFRNONAA >60   < > >60 >60  --   --  >60 >60  GFRAA >60   < > >60 >60  --   --  >60 >60  ANIONGAP 10   < > 9 10  --   --   --  7   < > = values in this interval not displayed.     Hematology Recent Labs  Lab 04/01/18 1316  04/01/18 2007 04/01/18 2016 04/02/18 0344  WBC 13.6*  --   --  10.3 11.4*  RBC 4.16  --   --  3.38* 2.95*  HGB 12.4   < > 10.5* 10.2* 8.9*  HCT 37.9   < > 31.0* 30.3* 26.3*  MCV 91.1  --   --  89.6 89.2  MCH 29.8  --   --  30.2 30.2  MCHC 32.7  --   --  33.7 33.8  RDW 14.3  --   --  14.2 14.3  PLT 145*  --   --  129* 151   < > = values in this interval not displayed.    Cardiac Enzymes Recent Labs  Lab 03/27/18 0742 03/27/18 1532  TROPONINI <0.03 <0.03   No results for input(s): TROPIPOC in the last 168 hours.   BNP Recent Labs  Lab 03/27/18 0742  BNP 400.0*     DDimer No results for input(s): DDIMER in the last 168 hours.   Radiology    Dg Chest Port 1 View  Result Date: 04/02/2018 CLINICAL DATA:  Follow-up myxoma removal EXAM: PORTABLE CHEST 1 VIEW COMPARISON:  04/01/2018 FINDINGS: Postsurgical changes are again noted. Swan-Ganz catheter and mediastinal drain as well as a  right thoracostomy catheter are noted. The endotracheal tube and nasogastric catheter have been removed in the interval. Slight increase in bibasilar atelectasis is noted likely related to decreased inspiratory effort. No pneumothorax is seen. No bony abnormality is noted. IMPRESSION: Postoperative changes with tubes and lines as described. Slight increase in bibasilar atelectasis following endotracheal tube removal. Electronically Signed   By: Inez Catalina M.D.   On: 04/02/2018 08:32   Dg Chest Port 1 View  Result Date: 04/01/2018 CLINICAL DATA:  Post-op RESECTION OF LEFT ATRIAL MYXOMA (N/A)TRANSESOPHAGEAL ECHOCARDIOGRAM (TEE) (N/A)MITRAL VALVE REPAIR. (N/A) - Mitral valve annuloplasty EXAM: PORTABLE CHEST 1 VIEW COMPARISON:  03/27/2018 FINDINGS: Since prior exam, cardiac  surgery has been performed. The cardiac silhouette is mildly enlarged. New bowel is evident. There is no mediastinal widening. There is opacity in the right lower lung consistent with atelectasis. No pulmonary edema. No pleural effusion or pneumothorax. Endotracheal tube, nasal/orogastric tube, right internal jugular Swan-Ganz catheter, mediastinal tube and right chest tube are well positioned. IMPRESSION: 1. No evidence of an operative complication. No mediastinal widening, pulmonary edema or pneumothorax. 2. Mild right lung base opacity consistent with atelectasis. 3. Support apparatus is well positioned. Electronically Signed   By: Lajean Manes M.D.   On: 04/01/2018 13:57    Cardiac Studies     Patient Profile     67 y.o. female who was found to have an atrial myxoma.  She is now status post removal of her atrial myxoma.  Assessment & Plan    1.  Status post atrial myxoma removal: The patient is doing well following surgery.  She also required mitral valve repair.   She is doing very well.  We will get an echocardiogram in several days to serve as our baseline following surgery.  She is maintaining normal sinus rhythm.  For questions or updates, please contact [facility] Please consult [email or web address] for contact info under Cardiology/STEMI.      Signed, Mertie Moores, MD  04/02/2018, 9:08 AM

## 2018-04-02 NOTE — Anesthesia Postprocedure Evaluation (Signed)
Anesthesia Post Note  Patient: Jacqueline [name]  Procedure(s) Performed: RESECTION OF LEFT ATRIAL MYXOMA (N/A ) TRANSESOPHAGEAL ECHOCARDIOGRAM (TEE) (N/A ) MITRAL VALVE REPAIR. (N/A )     Patient location during evaluation: SICU Anesthesia Type: General Level of consciousness: awake Pain management: pain level controlled Vital Signs Assessment: post-procedure vital signs reviewed and stable Respiratory status: spontaneous breathing Cardiovascular status: stable Postop Assessment: no apparent nausea or vomiting Anesthetic complications: no Comments: No current complaints. NSR.     Last Vitals:  Vitals:   04/02/18 1239 04/02/18 1642  BP:    Pulse:    Resp:    Temp: 37.3 C 37.1 C  SpO2:      Last Pain:  Vitals:   04/02/18 1642  TempSrc: Oral  PainSc:    Pain Goal:                 Effie Berkshire

## 2018-04-02 NOTE — Progress Notes (Signed)
Patient ID: Jacqueline [name], female   DOB: 1951-10-08, 67 y.o.   MRN: [id number] [facility] Evening Rounds:  Hemodynamically stable. Urine output good Awake and alert, up in chair  BMET    Component Value Date/Time   NA 137 04/02/2018 1802   K 3.9 04/02/2018 1802   CL 99 (L) 04/02/2018 1802   CO2 23 04/02/2018 0344   GLUCOSE 110 (H) 04/02/2018 1802   BUN 14 04/02/2018 1802   CREATININE 0.70 04/02/2018 1802   CREATININE 0.74 07/16/2015 1249   CALCIUM 7.8 (L) 04/02/2018 0344   GFRNONAA >60 04/02/2018 1752   GFRAA >60 04/02/2018 1752   CBC    Component Value Date/Time   WBC 11.0 (H) 04/02/2018 1752   RBC 3.07 (L) 04/02/2018 1752   HGB 8.8 (L) 04/02/2018 1802   HCT 26.0 (L) 04/02/2018 1802   PLT 139 (L) 04/02/2018 1752   MCV 89.6 04/02/2018 1752   MCH 29.6 04/02/2018 1752   MCHC 33.1 04/02/2018 1752   RDW 14.2 04/02/2018 1752   LYMPHSABS 1.7 10/30/2016 0950   MONOABS 0.6 10/30/2016 0950   EOSABS 0.1 10/30/2016 0950   BASOSABS 0.0 10/30/2016 0950

## 2018-04-02 NOTE — Discharge Summary (Addendum)
Physician Discharge Summary  Patient ID: MERRYL BUCKELS MRN: [id number] DOB/AGE: 65-Aug-1952 65 y.o.  Admit date: 03/27/2018 Discharge date: 04/06/2018  Admission Diagnoses:  Patient Active Problem List   Diagnosis Date Noted  . S/P mitral valve repair 04/02/2018  . Myxoma 04/01/2018  . COPD with chronic bronchitis ([place]) 03/29/2018  . Dyspnea on exertion 03/28/2018  . Atrial mass   . Chronic pain syndrome 03/27/2018  . Elevated brain natriuretic peptide (BNP) level 03/27/2018  . Tobacco abuse 03/27/2018  . Hypokalemia 03/27/2018  . HTN (hypertension) 03/27/2018  . Constipation 10/06/2017  . GERD (gastroesophageal reflux disease) 09/26/2016  . PUD (peptic ulcer disease) 10/01/2015  . Nausea with vomiting 10/01/2015  . Loss of weight 10/01/2015  . Dizzy 07/16/2015  . Major depressive disorder, recurrent, severe without psychotic features ([place])   . Substance induced mood disorder ([place]) 04/24/2015  . Opioid type dependence, continuous ([place]) 04/24/2015  . Severe major depression without psychotic features ([place]) 04/24/2015  . PTSD (post-traumatic stress disorder) 04/24/2015  . Chest pain, localized 04/28/2012  . Odynophagia 09/30/2011  . Colon cancer screening 09/30/2011  . Gastric tumor 09/30/2011  . CLOSED FRACTURE OF UPPER END OF FIBULA 07/31/2010  . Dysphagia, idiopathic 07/17/2009  . ABDOMINAL PAIN, GENERALIZED 07/17/2009  . GASTRITIS 07/12/2009  . IBS 07/12/2009  . RUQ PAIN 07/12/2009  . EPIGASTRIC PAIN 07/12/2009  . PANCREATITIS, ACUTE, HX OF 07/12/2009  . Osteoarthrosis, unspecified whether generalized or localized, hand 11/09/2008  . OSTEOARTHRITIS, LOWER LEG 10/25/2008  . DERANGEMENT MENISCUS 10/16/2008  . KNEE PAIN 10/16/2008    Discharge Diagnoses: LEFT ATRIAL MYXOMA  Patient Active Problem List   Diagnosis Date Noted  . S/P mitral valve repair 04/02/2018  . S/P Resection of Atrial Myxoma 04/01/2018  . COPD with chronic bronchitis ([place]) 03/29/2018  .  Dyspnea on exertion 03/28/2018  . Atrial mass   . Chronic pain syndrome 03/27/2018  . Elevated brain natriuretic peptide (BNP) level 03/27/2018  . Tobacco abuse 03/27/2018  . Hypokalemia 03/27/2018  . HTN (hypertension) 03/27/2018  . Constipation 10/06/2017  . GERD (gastroesophageal reflux disease) 09/26/2016  . PUD (peptic ulcer disease) 10/01/2015  . Nausea with vomiting 10/01/2015  . Loss of weight 10/01/2015  . Dizzy 07/16/2015  . Major depressive disorder, recurrent, severe without psychotic features ([place])   . Substance induced mood disorder ([place]) 04/24/2015  . Opioid type dependence, continuous ([place]) 04/24/2015  . Severe major depression without psychotic features ([place]) 04/24/2015  . PTSD (post-traumatic stress disorder) 04/24/2015  . Chest pain, localized 04/28/2012  . Odynophagia 09/30/2011  . Colon cancer screening 09/30/2011  . Gastric tumor 09/30/2011  . CLOSED FRACTURE OF UPPER END OF FIBULA 07/31/2010  . Dysphagia, idiopathic 07/17/2009  . ABDOMINAL PAIN, GENERALIZED 07/17/2009  . GASTRITIS 07/12/2009  . IBS 07/12/2009  . RUQ PAIN 07/12/2009  . EPIGASTRIC PAIN 07/12/2009  . PANCREATITIS, ACUTE, HX OF 07/12/2009  . Osteoarthrosis, unspecified whether generalized or localized, hand 11/09/2008  . OSTEOARTHRITIS, LOWER LEG 10/25/2008  . DERANGEMENT MENISCUS 10/16/2008  . KNEE PAIN 10/16/2008   Discharged Condition: good  History of Present Illness:  Mrs. Kolarik is a 67 yo female with known history of tobacco abuse, anxiety, migraines, chronic back pain, IBS, GERD and a remote history of a gastric tumor.  She presented with a several month complaint of shortness of breath and fatigue.  It originally started last summer, but has progressed over the past several months.  She has also developed episodes of dizziness.  Her most recent episode  occurred while she was up doing light house work.  She sat down to rest and was unable to not get up and had some mild chest  heaviness.  She presented to the ED for evaluation and was found to be in CHF.  EKG was obtained and did not show evidence of ischemic heart changes.  Echocardiogram was obtained and showed the presence of a large Atrial Myxoma.  She was admitted to the hospital for further care.  Hospital Course:   She was symptom free during admission.  She responded to diuretic therapy.  It was felt surgical resection would be indicated and TCTS consult was requested.  She was evaluated by Dr. Roxan Hockey who felt surgical resection would be indicated.  The risks and benefits of the procedure were explained to the patient and she was agreeable to proceed.  The patient was taken to the operating room on 04/01/2018.  She underwent Resection of Atrial Myxoma and mitral valve annuloplasty procedure.  She tolerated the procedure without difficulty and was taken to the SICU in stable condition.  She was extubated the evening of surgery.  During her stay in the SICU the patient was weaned off NTG and Neo-Synephrine drips as tolerated.  The patient's arterial line and chest tubes were removed without difficulty.  She was maintaining NSR and ambulating in the SICU.  She was medically stable for transfer to the telemetry unit for further care on 04/04/2018.  On the floor she continued to progress. She was weaned off oxygen and tolerating room air. She was ambulating around the unit with limited assistance. We were able to wean her off midodrine. Her HR remained in NSR and we removed her epicardial pacing wires on 04/05/2018. Today, she is ambulating with limited assistance, tolerating room air, her incision is healing well, and she is ready for discharge home with family. She will get her INR drawn at the [facility] coumadin [facility] and Dr. Acie Fredrickson will adjust dose as needed.   Significant Diagnostic Studies: cardiac graphics:   Echocardiogram:  - Normal LV function; elevated LV filling pressure; large mass in left atrium (probable  myxoma) obstructing MV inflow (mean  gradient 8 mmHg) and mild MR; severe LAE; trace TR with mildly   elevated pulmonary pressure; results called to Dr Dyann Kief.  Treatments: surgery:   Median sternotomy, extracorporeal circulation, resection of left atrial myxoma by biatrial approach, mitral valve annuloplasty with 30-mm Edwards Physio II annuloplasty ring (model 5200, serial [id number]).   Discharge Exam: Blood pressure (!) 152/90, pulse 82, temperature 99 F (37.2 C), temperature source Oral, resp. rate 16, height 5\' 2"  (1.575 m), weight 117 lb 9.6 oz (53.3 kg), SpO2 95 %.   General appearance: alert, cooperative and no distress Heart: regular rate and rhythm, S1, S2 normal, no murmur, click, rub or gallop Lungs: clear to auscultation bilaterally Abdomen: soft, non-tender; bowel sounds normal; no masses,  no organomegaly Extremities: extremities normal, atraumatic, no cyanosis or edema Wound: clean and dry    Disposition: Discharge disposition: 01-Home or Self Care     Home  Discharge Medications:  The patient has been discharged on:   1.Beta Blocker:  Yes [   ]                              No   [ x  ]  If No, reason:hypotension  2.Ace Inhibitor/ARB: Yes [   ]                                     No  [  x  ]                                     If No, reason: hypotension  3.Statin:   Yes [ x  ]                  No  [   ]                  If No, reason:  4.Ecasa:  Yes  [   ]                  No   [ x ]                  If No, reason: allergy       Allergies as of 04/06/2018      Reactions   Aspirin Other (See Comments)   Has had part of stomach removed, cannot take aspirin   Ketorolac Tromethamine Other (See Comments)   NSAID > NOT TO TAKE ASA NOR NSAIDS DUE TO GASTRECTOMY] "makes my hands draw up"   Nsaids Other (See Comments)   Due to stomach issues (has had part of stomach removed, cannot take NSAIDS OR ASA)   Sulfonamide  Derivatives Nausea And Vomiting, Other (See Comments)   SYNCOPE > LOSS CONSCIOUSNESS    Imitrex [sumatriptan Base] Palpitations   Fast heart rate   Promethazine Hcl Other (See Comments)   Legs jerking -" like restless legs"      Medication List    STOP taking these medications   diclofenac sodium 1 % Gel Commonly known as:  VOLTAREN   ibuprofen 200 MG tablet Commonly known as:  ADVIL,MOTRIN   SUBOXONE 8-2 MG Film Generic drug:  Buprenorphine HCl-Naloxone HCl     TAKE these medications   atorvastatin 40 MG tablet Commonly known as:  LIPITOR Take 1 tablet (40 mg total) by mouth daily at 6 PM.   cetirizine 10 MG tablet Commonly known as:  ZYRTEC Take 10 mg by mouth daily.   cyclobenzaprine 10 MG tablet Commonly known as:  FLEXERIL Take 10 mg by mouth 3 (three) times daily.   diphenhydrAMINE 25 mg capsule Commonly known as:  BENADRYL Take 25 mg by mouth every 6 (six) hours as needed (for allergies.).   FLUoxetine 40 MG capsule Commonly known as:  PROZAC Take 40 mg by mouth daily.   gabapentin 300 MG capsule Commonly known as:  NEURONTIN Take 2 capsules (600 mg total) by mouth 3 (three) times daily.   omeprazole 20 MG capsule Commonly known as:  PRILOSEC Take 20 mg by mouth daily.   ondansetron 4 MG tablet Commonly known as:  ZOFRAN TAKE 1 TABLET(4 MG) BY MOUTH EVERY 8 HOURS AS NEEDED FOR NAUSEA OR VOMITING   oxyCODONE 5 MG immediate release tablet Commonly known as:  Oxy IR/ROXICODONE Take 1 tablet (5 mg total) by mouth every 4 (four) hours as needed for severe pain.   TYLENOL 500 MG tablet Generic drug:  acetaminophen Take 500-1,000 mg by mouth every 6 (six) hours as needed (for  fever.).   warfarin 2.5 MG tablet Commonly known as:  COUMADIN Take 1 tablet (2.5 mg total) by mouth daily at 6 PM. INR to be drawn at the coumadin clinic and dosing adjusted by Dr. Acie Fredrickson      Follow-up Information    Melrose Nakayama, MD Follow up on 05/04/2018.    Specialty:  Cardiothoracic Surgery Why:  Appointment is at 10:45, please get CXR at 10:15 at [facility] located on first floor of our office building Contact information: [place] [address] [phone]        Neale Burly, MD. Call in 1 day(s).   Specialty:  Internal Medicine Contact information: [place] [address] [phone]        Nahser, Wonda Cheng, MD .   Specialty:  Cardiology Contact information: [place] [address] [phone]        Richardson Dopp T, PA-C Follow up.   Specialties:  Cardiology, Physician Assistant Why:  Your appointment is on 5/1 at 11:15am. Please bring your medication list.  Contact information: [address] [place] [address] [phone]        nursing suture removal appointment Follow up.   Why:  Your suture removal appointment is on 4/23 at 10:30am. Contact information: Dr. Leonarda Salon office       [facility] Office Follow up.   Specialty:  Cardiology Why:  The office will call you for your coumadin appointment.  Contact information: [address] [place] [phone]          Signed: Elgie Collard 04/06/2018, 10:17 AM

## 2018-04-02 NOTE — Op Note (Signed)
NAMESHAMARIE, CALL NO.:  [id number]  MEDICAL RECORD NO.:  [id number]  LOCATION:  4E01C                        FACILITY:  [facility]  PHYSICIAN:  Revonda Standard. Roxan Hockey, M.D.DATE OF BIRTH:  [birth year]  DATE OF PROCEDURE:  04/01/2018 DATE OF DISCHARGE:                              OPERATIVE REPORT   PREOPERATIVE DIAGNOSIS:  Left atrial myxoma.  POSTOPERATIVE DIAGNOSES:  Left atrial myxoma and mitral insufficiency.  PROCEDURES:  Median sternotomy, extracorporeal circulation, Resection of left atrial myxoma by biatrial approach, Mitral valve annuloplasty with 30-mm Edwards Physio II annuloplasty ring (model 5200, serial [id number]).  SURGEON:  Revonda Standard. Roxan Hockey, M.D.  ASSISTANT:  Nicholes Rough, PA-C.  ANESTHESIA:  General.  FINDINGS:  A 4.5-cm left atrial myxoma arising from the inferior aspect of the interatrial septum, significant mitral regurgitation after removal of the myxoma.  No residual mitral regurgitation after annuloplasty.  CLINICAL NOTE:  Mrs. Battie is a 67 year old woman with a history of tobacco abuse, anxiety, and chronic pain.  She also has a remote history of a gastric tumor.  She presented with a chief complaint of shortness of breath, fatigue, and dizziness.  Workup revealed a large left atrial myxoma.  She was advised to undergo surgical resection.  The indications, risks, benefits, and alternatives were discussed in detail with the patient.  She understood and accepted the risks and agreed to proceed.  She had cardiac catheterization, which showed mild plaquing, but no hemodynamically significant stenoses.  OPERATIVE NOTE:  Mrs. Selway was brought to the operating room on April 01, 2018. In the preoperative holding area, Anesthesia placed a Swan- Ganz catheter and an arterial blood pressure monitoring line.  She was taken to the operating room, anesthetized and intubated. Transesophageal echocardiography was performed.  It revealed a  large left atrial myxoma essentially filling the entire left atrium.  The mitral valve was impossible to evaluate due to the size of the myxoma, but there was no other valvular pathology.  She had preserved left ventricular function.  A Foley catheter was placed.  Intravenous antibiotics were administered.  The chest, abdomen, and legs were prepped and draped in usual sterile fashion.  A median sternotomy was performed.  Hemostasis was achieved.  A sternal retractor was placed.  The patient was fully heparinized.  The pericardium was opened.  There was a small pericardial effusion.  The ascending aorta was inspected.  There was no evidence of atherosclerotic disease on direct examination or by transesophageal echocardiography. After confirming adequate anticoagulation with ACT measurement, the aorta was cannulated via concentric 2-0 Ethibond pledgeted pursestring sutures.  A 31-French metal-tipped right angle venous cannula was placed via a pursestring suture in the superior vena cava and directed superiorly.  Cardiopulmonary bypass was initiated.  A 36-French malleable cannula was placed via a pursestring suture in the inferior aspect of the right atrium and directed into the inferior vena cava. The caval tapes were placed, but only tightened during the open portion of the procedure.  Carbon dioxide was insufflated into the operative field.  A foam pad was placed in the pericardium to insulate the heart. A temperature probe was placed in myocardial septum.  A cardioplegia cannula was placed  in the ascending aorta.  The aorta was crossclamped.  The left ventricle was emptied via the aortic root vent.  Cardiac arrest then was achieved with a combination of cold antegrade and retrograde blood cardioplegia and topical iced saline.  An initial 500 mL of cardioplegia was given antegrade.  There was a rapid diastolic arrest and initial cooling.  An additional liter of cardioplegia was  administered retrograde and there was additional cooling to less than 10 degrees Celsius.  Additional doses of cardioplegia were given at 20-minute intervals during the cross-clamp portion of the procedure.  The caval tapes were tightened.  A right atriotomy was performed.  An incision was made through the fossa ovalis.  The large left atrial myxoma essentially filled the entire left atrium.  It was not clear at this point where the stalk of the tumor arose.  The interatrial groove was dissected out and the left atriotomy was made just beyond the insertion of the pulmonary veins.  Working from both the right and left atrium and transseptal approach, the stalk of the myxoma was identified coming off the inferior aspect of the septum.  This was excised keeping a 1-cm margin of normal atrial endocardium around the circumference of the stalk.  Full thickness of the atrial musculature was taken in the area of the stalk.  The tumor was removed from the left atriotomy and sent for permanent pathology.  A piece of the pericardium, approximately 4 x 4 cm, was excised and prepared in glutaraldehyde per protocol.  This was done prior to cross- clamping the aorta.  The mitral valve was inspected.  The left ventricle was distended with saline and there was significant leakage from the mitral valve.  There was some posterior annular dilatation.  The anterior leaflet sized for a 30-mm Edwards Physio II annuloplasty ring. 2-0 Ethibond horizontal mattress sutures were placed in the anulus circumferentially.  A total of 14 sutures were utilized.  These were then placed through the cuff of the annuloplasty ring, which was lowered into place.  The sutures were sequentially tied.  Testing the valve by distending the left ventricle showed a good margin of coaptation and no residual insufficiency.  The pericardial patch then was sewn over the defect in the atrial septal wall with running 4-0 Prolene sutures.   The fossa ovalis area was closed primarily using a running 4-0 Prolene sutures.  Two layers of sutures were used, first a running horizontal mattress suture followed by a running simple suture.  Systemic rewarming was begun.  The left atriotomy was closed with 2 layers of running 4-0 Prolene suture in a similar fashion, and then, finally the right atriotomy was closed with 2 layers of running 4-0 Prolene suture.  The patient was placed in Trendelenburg position.  An attempt was made to give warm retrograde cardioplegia, however, the retrograde cardioplegia cannula had become dislodged.  It should be noted the de-airing was performed prior to tying the sutures on each of the atrial closures. The patient was placed in Trendelenburg position.  Lidocaine was administered.  The aortic crossclamp was removed.  Total crossclamp time was 121 minutes.  She spontaneously resumed sinus rhythm with a long PR interval.  She did not require defibrillation.  A dopamine infusion was initiated at 3 mcg/kg/min.  There was good hemostasis at both the atriotomies.  Epicardial pacing wires were placed on the right ventricle and right atrium.  The superior vena caval cannula was redirected into the right atrium and the inferior  vena cava cannula was removed.  There was good hemostasis at the cannulation site.  The retrograde cardioplegia cannula was removed.  The antegrade cardioplegia cannula was left in place as an aortic vent.  When the patient rewarmed to a core temperature of 37 degrees Celsius, she was weaned from cardiopulmonary bypass on the first attempt.  The post-bypass transesophageal echocardiography revealed good left ventricular function with preserved wall motion.  There was no residual air in the heart. There was no mitral regurgitation.  With a bubble study, there was no evidence of any atrial septal defect.  A test dose of protamine was administered.  The cardioplegia cannula and aortic  cannulae were removed.  The superior vena cava cannula was removed as well.  There was good hemostasis at all sites.  The remainder of the protamine was administered without incident.  The cardiac index remained greater than 2 L/min/m2 throughout the post-bypass period.  The chest was copiously irrigated with warm saline.  Hemostasis was achieved.  A 36-French chest tube and 32-French Blake drain were placed through separate subcostal incisions.  The pericardium was reapproximated over the ascending aorta with interrupted 3-0 silk sutures.  The sternum was closed with a combination of single and double heavy gauge stainless steel wires.  The pectoralis fascia, subcutaneous tissue, and skin were closed in a standard fashion.  All sponge, needle, and instrument counts were correct at the end of the procedure.  The patient was taken from the operating room to the Surgical Intensive Care Unit intubated and in stable condition.     Revonda Standard Roxan Hockey, M.D.     SCH/MEDQ  D:  04/01/2018  T:  04/02/2018  Job:  [id number]

## 2018-04-03 ENCOUNTER — Inpatient Hospital Stay (HOSPITAL_COMMUNITY): Payer: Medicaid Other

## 2018-04-03 LAB — GLUCOSE, CAPILLARY
GLUCOSE-CAPILLARY: 93 mg/dL (ref 65–99)
Glucose-Capillary: 110 mg/dL — ABNORMAL HIGH (ref 65–99)
Glucose-Capillary: 118 mg/dL — ABNORMAL HIGH (ref 65–99)
Glucose-Capillary: 127 mg/dL — ABNORMAL HIGH (ref 65–99)
Glucose-Capillary: 128 mg/dL — ABNORMAL HIGH (ref 65–99)
Glucose-Capillary: 130 mg/dL — ABNORMAL HIGH (ref 65–99)

## 2018-04-03 LAB — BASIC METABOLIC PANEL
Anion gap: 7 (ref 5–15)
BUN: 12 mg/dL (ref 6–20)
CO2: 27 mmol/L (ref 22–32)
Calcium: 8.2 mg/dL — ABNORMAL LOW (ref 8.9–10.3)
Chloride: 102 mmol/L (ref 101–111)
Creatinine, Ser: 0.61 mg/dL (ref 0.44–1.00)
GFR calc Af Amer: >60 mL/min (ref >60)
GFR calc non Af Amer: >60 mL/min (ref >60)
Glucose, Bld: 120 mg/dL — ABNORMAL HIGH (ref 65–99)
Potassium: 3.8 mmol/L (ref 3.5–5.1)
Sodium: 136 mmol/L (ref 135–145)

## 2018-04-03 LAB — CBC
HCT: 24.3 % — ABNORMAL LOW (ref 36.0–46.0)
Hemoglobin: 8 g/dL — ABNORMAL LOW (ref 12.0–15.0)
MCH: 29.7 pg (ref 26.0–34.0)
MCHC: 32.9 g/dL (ref 30.0–36.0)
MCV: 90.3 fL (ref 78.0–100.0)
Platelets: 123 10*3/uL — ABNORMAL LOW (ref 150–400)
RBC: 2.69 MIL/uL — ABNORMAL LOW (ref 3.87–5.11)
RDW: 14.2 % (ref 11.5–15.5)
WBC: 10.6 10*3/uL — ABNORMAL HIGH (ref 4.0–10.5)

## 2018-04-03 MED ORDER — FE FUMARATE-B12-VIT C-FA-IFC PO CAPS
1.0000 | ORAL_CAPSULE | Freq: Three times a day (TID) | ORAL | Status: DC
  Administered 2018-04-03 – 2018-04-06 (×8): 1 via ORAL
  Filled 2018-04-03 (×8): qty 1

## 2018-04-03 MED ORDER — WARFARIN SODIUM 2.5 MG PO TABS
2.5000 mg | ORAL_TABLET | Freq: Every day | ORAL | Status: DC
  Administered 2018-04-03 – 2018-04-05 (×3): 2.5 mg via ORAL
  Filled 2018-04-03 (×3): qty 1

## 2018-04-03 MED ORDER — WARFARIN - PHYSICIAN DOSING INPATIENT
Freq: Every day | Status: DC
  Administered 2018-04-03: 1
  Administered 2018-04-04 – 2018-04-05 (×2)

## 2018-04-03 MED ORDER — ONDANSETRON HCL 4 MG PO TABS
4.0000 mg | ORAL_TABLET | Freq: Three times a day (TID) | ORAL | Status: DC | PRN
  Administered 2018-04-03: 4 mg via ORAL
  Filled 2018-04-03: qty 1

## 2018-04-03 NOTE — Progress Notes (Signed)
Patient ID: Jacqueline [name], female   DOB: 09-Nov-1951, 67 y.o.   MRN: [id number] TCTS Evening Rounds:  Hemodynamically stable with MAP 70's.  Ambulated well

## 2018-04-03 NOTE — Progress Notes (Signed)
2 Days Post-Op Procedure(s) (LRB): RESECTION OF LEFT ATRIAL MYXOMA (N/A) TRANSESOPHAGEAL ECHOCARDIOGRAM (TEE) (N/A) MITRAL VALVE REPAIR. (N/A) Subjective: No complaints. Ambulated this am.  Objective: Vital signs in last 24 hours: Temp:  [98.5 F (36.9 C)-99.9 F (37.7 C)] 98.5 F (36.9 C) (04/13 0700) Pulse Rate:  [66] 66 (04/13 1024) Cardiac Rhythm: Normal sinus rhythm (04/13 0400) Resp:  [9-18] 9 (04/13 0600) BP: (85-130)/(52-100) 85/52 (04/13 1024) SpO2:  [93 %-100 %] 97 % (04/13 0600) Arterial Line BP: (149)/(66) 149/66 (04/12 1100) Weight:  [55.5 kg (122 lb 5.7 oz)] 55.5 kg (122 lb 5.7 oz) (04/13 0500)  Hemodynamic parameters for last 24 hours:    Intake/Output from previous day: 04/12 0701 - 04/13 0700 In: 1873 [P.O.:1400; I.V.:173; IV Piggyback:300] Out: 1300 [Urine:1280; Chest Tube:20] Intake/Output this shift: No intake/output data recorded.  General appearance: alert and cooperative Neurologic: intact Heart: regular rate and rhythm, S1, S2 normal, no murmur, click, rub or gallop Lungs: clear to auscultation bilaterally Abdomen: soft, non-tender; bowel sounds normal; no masses,  no organomegaly Extremities: extremities normal, atraumatic, no cyanosis or edema Wound: dressing dry  Lab Results: Recent Labs    04/02/18 1752 04/02/18 1802 04/03/18 0420  WBC 11.0*  --  10.6*  HGB 9.1* 8.8* 8.0*  HCT 27.5* 26.0* 24.3*  PLT 139*  --  123*   BMET:  Recent Labs    04/02/18 0344  04/02/18 1802 04/03/18 0420  NA 135  --  137 136  K 4.2  --  3.9 3.8  CL 105  --  99* 102  CO2 23  --   --  27  GLUCOSE 122*  --  110* 120*  BUN 9  --  14 12  CREATININE 0.59   < > 0.70 0.61  CALCIUM 7.8*  --   --  8.2*   < > = values in this interval not displayed.    PT/INR:  Recent Labs    04/01/18 1316  LABPROT 16.5*  INR 1.34   ABG    Component Value Date/Time   PHART 7.330 (L) 04/01/2018 1803   HCO3 25.1 04/01/2018 1803   TCO2 27 04/02/2018 1802   ACIDBASEDEF 1.0 04/01/2018 1803   O2SAT 99.0 04/01/2018 1803   CBG (last 3)  Recent Labs    04/03/18 0009 04/03/18 0333 04/03/18 0849  GLUCAP 118* 128* 110*   CXR: ok  Assessment/Plan: S/P Procedure(s) (LRB): RESECTION OF LEFT ATRIAL MYXOMA (N/A) TRANSESOPHAGEAL ECHOCARDIOGRAM (TEE) (N/A) MITRAL VALVE REPAIR. (N/A)  POD 2  SBP in the 80's with MAP in the 60's. Sinus rhythm 65. Will hold off on beta blocker and follow BP. She is asymptomatic. Lower BP may be due to her Prozac and Neurontin.  Weight is about back to normal. Hold further diuresis since BP borderline.  Start Coumadin for mitral valve repair and myxoma resection with atrial septal patch.  DC sleeve  Continue mobilization and IS  Expected postop anemia: start iron.    LOS: 6 days    Gaye Pollack 04/03/2018

## 2018-04-04 LAB — BPAM RBC
BLOOD PRODUCT EXPIRATION DATE: 201904222359
BLOOD PRODUCT EXPIRATION DATE: 201905032359
BLOOD PRODUCT EXPIRATION DATE: 201905032359
Blood Product Expiration Date: 201904222359
ISSUE DATE / TIME: 201904110831
ISSUE DATE / TIME: 201904110831
UNIT TYPE AND RH: 6200
UNIT TYPE AND RH: 6200
UNIT TYPE AND RH: 8400
Unit Type and Rh: 8400

## 2018-04-04 LAB — TYPE AND SCREEN
ABO/RH(D): AB POS
Antibody Screen: POSITIVE
UNIT DIVISION: 0
UNIT DIVISION: 0
Unit division: 0
Unit division: 0

## 2018-04-04 LAB — GLUCOSE, CAPILLARY
Glucose-Capillary: 112 mg/dL — ABNORMAL HIGH (ref 65–99)
Glucose-Capillary: 98 mg/dL (ref 65–99)

## 2018-04-04 LAB — PROTIME-INR
INR: 1.45
PROTHROMBIN TIME: 17.5 s — AB (ref 11.4–15.2)

## 2018-04-04 MED ORDER — DOCUSATE SODIUM 100 MG PO CAPS
200.0000 mg | ORAL_CAPSULE | Freq: Every day | ORAL | Status: DC
  Administered 2018-04-04 – 2018-04-06 (×2): 200 mg via ORAL
  Filled 2018-04-04 (×2): qty 2

## 2018-04-04 MED ORDER — MIDODRINE HCL 5 MG PO TABS
5.0000 mg | ORAL_TABLET | Freq: Three times a day (TID) | ORAL | Status: DC
Start: 2018-04-04 — End: 2018-04-05
  Administered 2018-04-04 – 2018-04-05 (×4): 5 mg via ORAL
  Filled 2018-04-04 (×4): qty 1

## 2018-04-04 MED ORDER — MOVING RIGHT ALONG BOOK
Freq: Once | Status: AC
  Administered 2018-04-04: 18:00:00
  Filled 2018-04-04: qty 1

## 2018-04-04 MED ORDER — TRAMADOL HCL 50 MG PO TABS
50.0000 mg | ORAL_TABLET | Freq: Four times a day (QID) | ORAL | Status: DC | PRN
  Administered 2018-04-05 – 2018-04-06 (×2): 50 mg via ORAL
  Filled 2018-04-04 (×2): qty 1

## 2018-04-04 MED ORDER — SODIUM CHLORIDE 0.9% FLUSH: 3.0000 mL | INTRAVENOUS | Status: DC | PRN

## 2018-04-04 MED ORDER — SODIUM CHLORIDE 0.9 % IV SOLN: 250.0000 mL | INTRAVENOUS | Status: DC | PRN

## 2018-04-04 MED ORDER — PATIENT'S GUIDE TO USING COUMADIN BOOK
Freq: Once | Status: AC
  Administered 2018-04-04: 18:00:00
  Filled 2018-04-04: qty 1

## 2018-04-04 MED ORDER — OXYCODONE HCL 5 MG PO TABS
5.0000 mg | ORAL_TABLET | ORAL | Status: DC | PRN
  Administered 2018-04-04 – 2018-04-06 (×6): 10 mg via ORAL
  Filled 2018-04-04 (×6): qty 2

## 2018-04-04 MED ORDER — BISACODYL 10 MG RE SUPP: 10.0000 mg | Freq: Every day | RECTAL | Status: DC | PRN

## 2018-04-04 MED ORDER — PANTOPRAZOLE SODIUM 40 MG PO TBEC
40.0000 mg | DELAYED_RELEASE_TABLET | Freq: Every day | ORAL | Status: DC
  Administered 2018-04-05 – 2018-04-06 (×2): 40 mg via ORAL
  Filled 2018-04-04 (×2): qty 1

## 2018-04-04 MED ORDER — BISACODYL 5 MG PO TBEC: 10.0000 mg | DELAYED_RELEASE_TABLET | Freq: Every day | ORAL | Status: DC | PRN

## 2018-04-04 MED ORDER — ACETAMINOPHEN 325 MG PO TABS: 650.0000 mg | ORAL_TABLET | Freq: Four times a day (QID) | ORAL | Status: DC | PRN

## 2018-04-04 MED ORDER — SODIUM CHLORIDE 0.9% FLUSH
3.0000 mL | Freq: Two times a day (BID) | INTRAVENOUS | Status: DC
  Administered 2018-04-04 – 2018-04-05 (×3): 3 mL via INTRAVENOUS

## 2018-04-04 NOTE — Progress Notes (Signed)
3 Days Post-Op Procedure(s) (LRB): RESECTION OF LEFT ATRIAL MYXOMA (N/A) TRANSESOPHAGEAL ECHOCARDIOGRAM (TEE) (N/A) MITRAL VALVE REPAIR. (N/A) Subjective: No complaints.  Ambulated yesterday without difficulty. Has not ambulated today.  Objective: Vital signs in last 24 hours: Temp:  [98.2 F (36.8 C)-99.1 F (37.3 C)] 98.4 F (36.9 C) (04/14 1100) Cardiac Rhythm: Heart block (04/14 0800) Resp:  [9-20] 9 (04/14 0800) BP: (84-116)/(49-79) 95/65 (04/14 0800) SpO2:  [87 %-99 %] 96 % (04/14 0800) Weight:  [55.3 kg (121 lb 14.6 oz)] 55.3 kg (121 lb 14.6 oz) (04/14 0500)  Hemodynamic parameters for last 24 hours:    Intake/Output from previous day: 04/13 0701 - 04/14 0700 In: 600 [P.O.:600] Out: 1765 [Urine:1765] Intake/Output this shift: No intake/output data recorded.  General appearance: alert and cooperative Neurologic: intact Heart: regular rate and rhythm, S1, S2 normal, no murmur, click, rub or gallop Lungs: clear to auscultation bilaterally Extremities: extremities normal, atraumatic, no cyanosis or edema Wound: dressings dry  Lab Results: Recent Labs    04/02/18 1752 04/02/18 1802 04/03/18 0420  WBC 11.0*  --  10.6*  HGB 9.1* 8.8* 8.0*  HCT 27.5* 26.0* 24.3*  PLT 139*  --  123*   BMET:  Recent Labs    04/02/18 0344  04/02/18 1802 04/03/18 0420  NA 135  --  137 136  K 4.2  --  3.9 3.8  CL 105  --  99* 102  CO2 23  --   --  27  GLUCOSE 122*  --  110* 120*  BUN 9  --  14 12  CREATININE 0.59   < > 0.70 0.61  CALCIUM 7.8*  --   --  8.2*   < > = values in this interval not displayed.    PT/INR:  Recent Labs    04/04/18 0219  LABPROT 17.5*  INR 1.45   ABG    Component Value Date/Time   PHART 7.330 (L) 04/01/2018 1803   HCO3 25.1 04/01/2018 1803   TCO2 27 04/02/2018 1802   ACIDBASEDEF 1.0 04/01/2018 1803   O2SAT 99.0 04/01/2018 1803   CBG (last 3)  Recent Labs    04/03/18 2128 04/03/18 2352 04/04/18 0344  GLUCAP 127* 112* 98     Assessment/Plan: S/P Procedure(s) (LRB): RESECTION OF LEFT ATRIAL MYXOMA (N/A) TRANSESOPHAGEAL ECHOCARDIOGRAM (TEE) (N/A) MITRAL VALVE REPAIR. (N/A)  POD 3  Hemodynamically stable but runs low normal BP. MAP has been low 70's. No beta blocker for her.  Wt is about 3 lbs over baseline but have not diuresed further due to BP.  Continue Coumadin.  IS, ambulation  Transfer to 4E.   LOS: 7 days    Gaye Pollack 04/04/2018

## 2018-04-04 NOTE — Progress Notes (Signed)
Patient ID: Jacqueline [name], female   DOB: 03/26/1951, 67 y.o.   MRN: [id number] TCTS Evening Rounds:  Stable day.   Awaiting bed on 4E.

## 2018-04-04 NOTE — Progress Notes (Signed)
Report called to receiving RN all questions answered. Pt walked 342ft and then transferred to 4E03, when transfered on the monitor pt in A-fib HR 90s, pt asymptomatic without complaints, transfering RN updated at [facility] of EKG change

## 2018-04-05 LAB — PROTIME-INR
INR: 1.78
Prothrombin Time: 20.5 s — ABNORMAL HIGH (ref 11.4–15.2)

## 2018-04-05 MED ORDER — CYCLOBENZAPRINE HCL 10 MG PO TABS
10.0000 mg | ORAL_TABLET | Freq: Three times a day (TID) | ORAL | Status: DC | PRN
  Administered 2018-04-05: 10 mg via ORAL
  Filled 2018-04-05: qty 1

## 2018-04-05 MED ORDER — CYCLOBENZAPRINE HCL 10 MG PO TABS
5.0000 mg | ORAL_TABLET | Freq: Once | ORAL | Status: AC
  Administered 2018-04-05: 5 mg via ORAL
  Filled 2018-04-05: qty 1

## 2018-04-05 MED ORDER — DIPHENHYDRAMINE HCL 25 MG PO CAPS: 25.0000 mg | ORAL_CAPSULE | Freq: Four times a day (QID) | ORAL | Status: DC | PRN

## 2018-04-05 MED ORDER — MIDODRINE HCL 5 MG PO TABS: 5.0000 mg | ORAL_TABLET | Freq: Two times a day (BID) | ORAL | Status: DC

## 2018-04-05 NOTE — Progress Notes (Signed)
Pt converted to NSR pt stable and resting in bed. We'll continue to monitor.

## 2018-04-05 NOTE — Progress Notes (Signed)
CARDIAC REHAB PHASE I   PRE:  Rate/Rhythm: 78 SR  BP:  Supine:   Sitting: 111/74  Standing:    SaO2: 95%RA  MODE:  Ambulation: 800 ft   POST:  Rate/Rhythm: 90 SR  BP:  Supine:   Sitting: 118/85  Standing:    SaO2: 95%RA [id number] Pt walked 800 ft on RA with hand held asst with steady gait. Tolerated well. A little tired by the end of walk. Pt demonstrated 1250 ml on IS. Very motivated with walking and using IS. To recliner with call bell.   Graylon Good, RN BSN  04/05/2018 9:19 AM

## 2018-04-05 NOTE — Progress Notes (Signed)
Removed epicardial wires per order. 4 intact.  Pt tolerated procedure well.  Pt instructed to remain on bedrest for one hour.  Frequent vitals will be taken and documented. Pt resting with call bell within reach. Payton Emerald, RN

## 2018-04-05 NOTE — Progress Notes (Addendum)
      [address]       [place] [address]             [phone]      4 Days Post-Op Procedure(s) (LRB): RESECTION OF LEFT ATRIAL MYXOMA (N/A) TRANSESOPHAGEAL ECHOCARDIOGRAM (TEE) (N/A) MITRAL VALVE REPAIR. (N/A) Subjective: Having some incisional pain this morning  Objective: Vital signs in last 24 hours: Temp:  [97.8 F (36.6 C)-99 F (37.2 C)] 97.8 F (36.6 C) (04/15 0620) Pulse Rate:  [79-82] 79 (04/15 0620) Cardiac Rhythm: Normal sinus rhythm (04/15 0700) Resp:  [9-17] 17 (04/15 0620) BP: (82-120)/(52-75) 120/75 (04/15 0620) SpO2:  [88 %-96 %] 92 % (04/15 0620) Weight:  [121 lb 4.1 oz (55 kg)] 121 lb 4.1 oz (55 kg) (04/15 0620)     Intake/Output from previous day: 04/14 0701 - 04/15 0700 In: 240 [P.O.:240] Out: 650 [Urine:650] Intake/Output this shift: No intake/output data recorded.  General appearance: alert, cooperative and no distress Heart: regular rate and rhythm, S1, S2 normal, no murmur, click, rub or gallop Lungs: clear to auscultation bilaterally Abdomen: soft, non-tender; bowel sounds normal; no masses,  no organomegaly Extremities: extremities normal, atraumatic, no cyanosis or edema Wound: clean and dry  Lab Results: Recent Labs    04/02/18 1752 04/02/18 1802 04/03/18 0420  WBC 11.0*  --  10.6*  HGB 9.1* 8.8* 8.0*  HCT 27.5* 26.0* 24.3*  PLT 139*  --  123*   BMET:  Recent Labs    04/02/18 1802 04/03/18 0420  NA 137 136  K 3.9 3.8  CL 99* 102  CO2  --  27  GLUCOSE 110* 120*  BUN 14 12  CREATININE 0.70 0.61  CALCIUM  --  8.2*    PT/INR:  Recent Labs    04/05/18 0253  LABPROT 20.5*  INR 1.78   ABG    Component Value Date/Time   PHART 7.330 (L) 04/01/2018 1803   HCO3 25.1 04/01/2018 1803   TCO2 27 04/02/2018 1802   ACIDBASEDEF 1.0 04/01/2018 1803   O2SAT 99.0 04/01/2018 1803   CBG (last 3)  Recent Labs    04/03/18 2128 04/03/18 2352 04/04/18 0344  GLUCAP 127* 112* 98     Assessment/Plan: S/P Procedure(s) (LRB): RESECTION OF LEFT ATRIAL MYXOMA (N/A) TRANSESOPHAGEAL ECHOCARDIOGRAM (TEE) (N/A) MITRAL VALVE REPAIR. (N/A)  1. CV-NSR in the 80s, BP well controlled. Did have some rate controlled atrial fibrillation on telemetry last night. On Midodrine 5mg  TID.  2. Pulm-tolerating room air with okay oxygen saturation. Xopenex treatments.  3. Renal-creatinine 0.61, electrolytes okay 4. H and H 8.0/24.3 5. INR 1.78, coumadin 2.5mg  daily, Lovenox 30mg  daily 6. On oxycodone, Ultram, and Gabapentin  Plan: continue ambulation, Wean midodrine as able, will discontinue EPW while INR is below 2.0.    LOS: 8 days    Elgie Collard 04/05/2018 Patient seen and examined, agree with above Ambulated well today Dc home in AM if no issues  Remo Lipps C. Roxan Hockey, MD [facility] [phone]

## 2018-04-06 ENCOUNTER — Other Ambulatory Visit: Payer: Self-pay | Admitting: Physician Assistant

## 2018-04-06 LAB — PROTIME-INR
INR: 2.42
PROTHROMBIN TIME: 26.1 s — AB (ref 11.4–15.2)

## 2018-04-06 MED ORDER — ATORVASTATIN CALCIUM 40 MG PO TABS: 40.0000 mg | ORAL_TABLET | Freq: Every day | ORAL | 1 refills | Status: DC

## 2018-04-06 MED ORDER — OXYCODONE HCL 5 MG PO TABS: 5.0000 mg | ORAL_TABLET | ORAL | 0 refills | Status: DC | PRN

## 2018-04-06 MED ORDER — WARFARIN SODIUM 2.5 MG PO TABS: 2.5000 mg | ORAL_TABLET | Freq: Every day | ORAL | 1 refills | Status: DC

## 2018-04-06 MED ORDER — SUBOXONE 8-2 MG SL FILM: 1.0000 | ORAL_FILM | Freq: Three times a day (TID) | SUBLINGUAL | 0 refills | Status: DC

## 2018-04-06 NOTE — Progress Notes (Signed)
Progress Note  Patient Name: SENYA HINZMAN Date of Encounter: 04/06/2018  Primary Cardiologist:   Mertie Moores, MD   Subjective   Incisional and back pain but otherwise OK.    Inpatient Medications    Scheduled Meds: . atorvastatin  40 mg Oral q1800  . docusate sodium  200 mg Oral Daily  . enoxaparin (LOVENOX) injection  30 mg Subcutaneous QHS  . ferrous [id number] C-folic acid  1 capsule Oral TID PC  . FLUoxetine  40 mg Oral Daily  . gabapentin  600 mg Oral TID  . pantoprazole  40 mg Oral QAC breakfast  . sodium chloride flush  3 mL Intravenous Q12H  . warfarin  2.5 mg Oral q1800  . Warfarin - Physician Dosing Inpatient   Does not apply q1800   Continuous Infusions: . sodium chloride     PRN Meds: sodium chloride, acetaminophen, bisacodyl **OR** bisacodyl, cyclobenzaprine, diphenhydrAMINE, levalbuterol, ondansetron, oxyCODONE, sodium chloride flush, traMADol   Vital Signs    Vitals:   04/05/18 1400 04/05/18 2036 04/06/18 0421 04/06/18 0750  BP: (!) 153/85 130/83 (!) 155/96 (!) 152/90  Pulse:  82 81 82  Resp: 14 19 18 16   Temp:  99.1 F (37.3 C) 98.8 F (37.1 C) 99 F (37.2 C)  TempSrc:  Oral Oral Oral  SpO2:  93% 96% 95%  Weight:   117 lb 9.6 oz (53.3 kg)   Height:        Intake/Output Summary (Last 24 hours) at 04/06/2018 0900 Last data filed at 04/06/2018 0600 Gross per 24 hour  Intake 600 ml  Output 1400 ml  Net -800 ml   Filed Weights   04/04/18 0500 04/05/18 0620 04/06/18 0421  Weight: 121 lb 14.6 oz (55.3 kg) 121 lb 4.1 oz (55 kg) 117 lb 9.6 oz (53.3 kg)    Telemetry    NSR - Personally Reviewed  ECG    NA - Personally Reviewed  Physical Exam   GEN: No acute distress.   Neck: No  JVD Cardiac: RRR, no murmurs, rubs, or gallops.  Respiratory: Clear  to auscultation bilaterally. GI: Soft, nontender, non-distended  MS: No  edema; No deformity. Neuro:  Nonfocal  Psych: Normal affect   Labs    Chemistry Recent Labs    Lab 04/01/18 0440  04/02/18 0344 04/02/18 1752 04/02/18 1802 04/03/18 0420  NA 139   < > 135  --  137 136  K 3.7   < > 4.2  --  3.9 3.8  CL 103   < > 105  --  99* 102  CO2 26  --  23  --   --  27  GLUCOSE 96   < > 122*  --  110* 120*  BUN 9   < > 9  --  14 12  CREATININE 0.65   < > 0.59 0.74 0.70 0.61  CALCIUM 8.8*  --  7.8*  --   --  8.2*  GFRNONAA >60   < > >60 >60  --  >60  GFRAA >60   < > >60 >60  --  >60  ANIONGAP 10  --  7  --   --  7   < > = values in this interval not displayed.     Hematology Recent Labs  Lab 04/02/18 0344 04/02/18 1752 04/02/18 1802 04/03/18 0420  WBC 11.4* 11.0*  --  10.6*  RBC 2.95* 3.07*  --  2.69*  HGB 8.9* 9.1* 8.8* 8.0*  HCT  26.3* 27.5* 26.0* 24.3*  MCV 89.2 89.6  --  90.3  MCH 30.2 29.6  --  29.7  MCHC 33.8 33.1  --  32.9  RDW 14.3 14.2  --  14.2  PLT 151 139*  --  123*    Cardiac EnzymesNo results for input(s): TROPONINI in the last 168 hours. No results for input(s): TROPIPOC in the last 168 hours.   BNPNo results for input(s): BNP, PROBNP in the last 168 hours.   DDimer No results for input(s): DDIMER in the last 168 hours.   Radiology    No results found.  Cardiac Studies   ECHO  03/27/18   Study Conclusions  - Left ventricle: The cavity size was normal. Wall thickness was   normal. Systolic function was normal. The estimated ejection   fraction was in the range of 55% to 60%. Wall motion was normal;   there were no regional wall motion abnormalities. Doppler   parameters are consistent with high ventricular filling pressure. - Mitral valve: There was mild regurgitation. - Left atrium: The atrium was severely dilated. - Pulmonary arteries: Systolic pressure was mildly increased.  Impressions:  - Normal LV function; elevated LV filling pressure; large mass in   left atrium (probable myxoma) obstructing MV inflow (mean   gradient 8 mmHg) and mild MR; severe LAE; trace TR with mildly   elevated pulmonary  pressure; results called to Dr Dyann Kief.  Patient Profile     67 y.o. female with a hx of GERD,  who is being seen for the evaluation of atrial mass  at the request of  Dr. Wynelle Cleveland.  Assessment & Plan    Atrial myxoma:  Resected.  Mitral valve repair.  Going home today.    PAF:  NSR.  I will arrange follow up in [place] in the [facility].      She has follow up scheduled in our office on 04/21/18 with Richardson Dopp PAc.    For questions or updates, please contact [place] Please consult [email or web address] for contact info under Cardiology/STEMI.   Signed, Minus Breeding, MD  04/06/2018, 9:00 AM

## 2018-04-06 NOTE — Progress Notes (Addendum)
      [address]       [place] [address]             [phone]      5 Days Post-Op Procedure(s) (LRB): RESECTION OF LEFT ATRIAL MYXOMA (N/A) TRANSESOPHAGEAL ECHOCARDIOGRAM (TEE) (N/A) MITRAL VALVE REPAIR. (N/A) Subjective: She is having some right-sided rib pain. It gets better when she is sitting up.  Objective: Vital signs in last 24 hours: Temp:  [98.8 F (37.1 C)-99.6 F (37.6 C)] 98.8 F (37.1 C) (04/16 0421) Pulse Rate:  [76-82] 81 (04/16 0421) Cardiac Rhythm: Normal sinus rhythm (04/16 0700) Resp:  [14-27] 18 (04/16 0421) BP: (119-155)/(68-96) 155/96 (04/16 0421) SpO2:  [92 %-96 %] 96 % (04/16 0421) Weight:  [117 lb 9.6 oz (53.3 kg)] 117 lb 9.6 oz (53.3 kg) (04/16 0421)     Intake/Output from previous day: 04/15 0701 - 04/16 0700 In: 840 [P.O.:840] Out: 1400 [Urine:1400] Intake/Output this shift: No intake/output data recorded.  General appearance: alert, cooperative and no distress Heart: regular rate and rhythm, S1, S2 normal, no murmur, click, rub or gallop Lungs: clear to auscultation bilaterally Abdomen: soft, non-tender; bowel sounds normal; no masses,  no organomegaly Extremities: extremities normal, atraumatic, no cyanosis or edema Wound: clean and dry  Lab Results: No results for input(s): WBC, HGB, HCT, PLT in the last 72 hours. BMET: No results for input(s): NA, K, CL, CO2, GLUCOSE, BUN, CREATININE, CALCIUM in the last 72 hours.  PT/INR:  Recent Labs    04/06/18 0344  LABPROT 26.1*  INR 2.42   ABG    Component Value Date/Time   PHART 7.330 (L) 04/01/2018 1803   HCO3 25.1 04/01/2018 1803   TCO2 27 04/02/2018 1802   ACIDBASEDEF 1.0 04/01/2018 1803   O2SAT 99.0 04/01/2018 1803   CBG (last 3)  Recent Labs    04/03/18 2128 04/03/18 2352 04/04/18 0344  GLUCAP 127* 112* 98    Assessment/Plan: S/P Procedure(s) (LRB): RESECTION OF LEFT ATRIAL MYXOMA (N/A) TRANSESOPHAGEAL ECHOCARDIOGRAM (TEE) (N/A) MITRAL VALVE  REPAIR. (N/A)  1. CV-NSR in the 80s, BP well controlled. On Midodrine 5mg  TID-discontinue since hypertensive. EPW out.  2. Pulm-tolerating room air with okay oxygen saturation. Xopenex treatments.  3. Renal-creatinine 0.61, electrolytes okay 4. H and H 8.0/24.3 5. INR 2.24, coumadin 2.5mg  daily, Lovenox 30mg  daily 6. On oxycodone, Ultram, and Gabapentin  Plan: Discharge today. Suture removal appointment in 1 week.     LOS: 9 days    Elgie Collard 04/06/2018 Patient seen and examined, agree with above Home today  [place]. Roxan Hockey, MD [facility] [phone]

## 2018-04-06 NOTE — Progress Notes (Signed)
Discussed with the patient and all questioned fully answered. She will call me if any problems arise.  IV removed. Telemetry removed. Education completed with cardiac rehab. Pt given paper discharge instructions in discharge envelope. Given paper prescriptions including oxycodone prescription.   Fritz Pickerel, RN

## 2018-04-06 NOTE — Care Management Note (Signed)
Case Management Note Marvetta Gibbons RN, BSN Unit 4E-Case Manager [phone]  Patient Details  Name: Jacqueline [name] MRN: [id number] Date of Birth: 1951-12-08  Subjective/Objective:  Pt admitted with SOB s/p cath with atrial myxoma found- pt for OR with planned resection of myxoma on 04/01/18                 Action/Plan: PTA pt lived at home- CM to follow post op for transition of care needs  Expected Discharge Date:  04/06/18               Expected Discharge Plan:  Home/Self Care  In-House Referral:  NA  Discharge planning Services  CM Consult  Post Acute Care Choice:  NA Choice offered to:  NA  DME Arranged:    DME Agency:     HH Arranged:    [place] Agency:     Status of Service:  Completed, signed off  If discussed at [place] of Stay Meetings, dates discussed:  4/16  Discharge Disposition: home/self care   Additional Comments:  04/06/18- [place] RN, CM- pt for d/c home today- no CM needs noted for transition home.   Dawayne Patricia, RN 04/06/2018, 10:21 AM

## 2018-04-06 NOTE — Discharge Instructions (Signed)
Discharge Instructions:  1. You may shower, please wash incisions daily with soap and water and keep dry.  If you wish to cover wounds with dressing you may do so but please keep clean and change daily.  No tub baths or swimming until incisions have completely healed.  If your incisions become red or develop any drainage please call our office at [phone]  2. No Driving until cleared by Dr. Leonarda Salon office and you are no longer using narcotic pain medications  3. Monitor your weight daily.. Please use the same scale and weigh at same time... If you gain 5-10 lbs in 48 hours with associated lower extremity swelling, please contact our office at [phone]  4. Fever of 101.5 for at least 24 hours with no source, please contact our office at [phone]  5. Activity- up as tolerated, please walk at least 3 times per day.  Avoid strenuous activity, no lifting, pushing, or pulling with your arms over 8-10 lbs for a minimum of 6 weeks  6. If any questions or concerns arise, please do not hesitate to contact our office at [phone]  7. Please restart your Suboxone once you are through with your oxycodone prescription.

## 2018-04-06 NOTE — Progress Notes (Signed)
1023-1048 Education completed with pt who voiced understanding. Reviewed sternal precautions and staying in the tube. Discussed heart healthy food choices, ex ed , smoking cessation and CRP 2. Gave pt fake cigarette and smoking cessation handout. Pt stated she smokes to deal with stress. Discussed trying to find other ways to deal with stress. Referring to [place] Phase 2. Graylon Good RN BSN 04/06/2018 10:51 AM

## 2018-04-12 ENCOUNTER — Ambulatory Visit (INDEPENDENT_AMBULATORY_CARE_PROVIDER_SITE_OTHER): Payer: Medicaid Other | Admitting: *Deleted

## 2018-04-12 DIAGNOSIS — Z9889 Other specified postprocedural states: Secondary | ICD-10-CM

## 2018-04-12 DIAGNOSIS — Z5181 Encounter for therapeutic drug level monitoring: Secondary | ICD-10-CM

## 2018-04-12 LAB — POCT INR: INR: 1.9

## 2018-04-12 NOTE — Patient Instructions (Signed)
Pt has been drinking 2 Ensure daily.  Told pt to cut back to 1 daily and coumadin dose increased. Increase coumadin to 1 tablet daily except 1 1/2 tablets on Mondays, Wednesdays and Fridays Recheck in 1 week

## 2018-04-13 ENCOUNTER — Other Ambulatory Visit: Payer: Self-pay

## 2018-04-13 ENCOUNTER — Ambulatory Visit (INDEPENDENT_AMBULATORY_CARE_PROVIDER_SITE_OTHER): Payer: Self-pay

## 2018-04-13 DIAGNOSIS — Z4802 Encounter for removal of sutures: Secondary | ICD-10-CM

## 2018-04-13 NOTE — Progress Notes (Signed)
Patient arrived for nurse visit to remove sutures post- procedure of Resection atrial myxoma and MVR 04/01/2018.  2 Sutures removed with no signs/ symptoms of infection noted.  Patient tolerated procedure well, incisions are healing very nicely.  Patient instructed to keep the incision sites clean and dry.  Patient acknowledged instructions given.

## 2018-04-19 ENCOUNTER — Ambulatory Visit (INDEPENDENT_AMBULATORY_CARE_PROVIDER_SITE_OTHER): Payer: Medicaid Other | Admitting: *Deleted

## 2018-04-19 DIAGNOSIS — Z9889 Other specified postprocedural states: Secondary | ICD-10-CM | POA: Diagnosis not present

## 2018-04-19 DIAGNOSIS — Z5181 Encounter for therapeutic drug level monitoring: Secondary | ICD-10-CM

## 2018-04-19 LAB — POCT INR: INR: 2.2

## 2018-04-19 NOTE — Patient Instructions (Signed)
Increase coumadin to 1 1/2 tablets daily except 1 tablet on Sundays and Thursdays Recheck in 1 week

## 2018-04-21 ENCOUNTER — Encounter: Payer: Self-pay | Admitting: Physician Assistant

## 2018-04-21 ENCOUNTER — Ambulatory Visit: Payer: Medicaid Other | Admitting: Physician Assistant

## 2018-04-21 VITALS — BP 110/70 | HR 95 | Ht 62.0 in | Wt 115.0 lb

## 2018-04-21 DIAGNOSIS — Z9889 Other specified postprocedural states: Secondary | ICD-10-CM | POA: Diagnosis not present

## 2018-04-21 DIAGNOSIS — I2510 Atherosclerotic heart disease of native coronary artery without angina pectoris: Secondary | ICD-10-CM | POA: Diagnosis not present

## 2018-04-21 DIAGNOSIS — Z720 Tobacco use: Secondary | ICD-10-CM

## 2018-04-21 DIAGNOSIS — Z86018 Personal history of other benign neoplasm: Secondary | ICD-10-CM | POA: Diagnosis not present

## 2018-04-21 HISTORY — DX: Atherosclerotic heart disease of native coronary artery without angina pectoris: I25.10

## 2018-04-21 NOTE — Progress Notes (Signed)
Cardiology Office Note:    Date:  04/21/2018   ID:  [name], DOB [birth year], MRN [id number]  PCP:  Neale Burly, MD  Cardiologist:  Mertie Moores, MD   Referring MD: Neale Burly, MD   Chief Complaint  Patient presents with  . Hospitalization Follow-up    Status post atrial myxoma resection    History of Present Illness:    Jacqueline [name] is a 67 y.o. female with anxiety, migraine headaches, IBS, acid reflux, remote gastric tumor and tobacco abuse.  She was admitted to [facility] 4/6-4/16.  She presented with chest pain and shortness of breath and was found to be in acute heart failure.  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  Cardiac catheterization demonstrated no significant CAD (proximal LAD 40%).  She was seen by cardiothoracic surgery and underwent resection of atrial myxoma with atrial septal patch and MV repair 04/01/18 by Dr. Roxan Hockey.  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  She was placed on Coumadin for antithrombotic therapy (guideline directed therapy x3 months w/ goal INR 2.5, followed by aspirin).  Coumadin is being managed at our [place] clinic.   [name] returns for follow-up.  She is here today with her daughter and granddaughter.  She has been doing well since discharge from the hospital.  However, she does remain sore in her chest.  She has been trying to avoid pain medications if possible.  She denies significant shortness of breath, PND, edema, syncope.  Her appetite is okay.  She denies any bleeding issues.  Coumadin is managed in the [place] clinic.  She does request follow-up in our [place] office as this is closer to her home.  Prior CV studies:   The following studies were reviewed today:  Cardiac Catheterization 03/29/18 LAD proximal 40  Echo 03/27/2018 EF 55-60, normal wall motion, mild MR, large mass in left atrium (probable myxoma) obstructive mitral valve inflow (mean gradient 8  mmHg), severe LAE, mildly increased PASP  Past Medical History:  Diagnosis Date  . Anxiety   . CAD (coronary artery disease) 04/21/2018   LHC 4/19: no sig CAD, pLAD 40  . Chronic back pain   . Drug-seeking behavior   . Gastric nodule 2009   EUS, ?leiomyoma  . 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 GERD (gastroesophageal reflux disease)   . Gout   . History of pancreatitis    Biliary and/or etoh?  . Irritable bowel syndrome   . Knee pain   . Migraines   . Migraines   . Peripheral neuropathy   . Polysubstance abuse (HCC)    opiates, cocaine, marijuana   Surgical Hx: The patient  has a past surgical history that includes back surgery /ray cage fusion comberg, gso; toe graft; Cesarean section; Partial gastrectomy (1990); Tonsillectomy; Abdominal hysterectomy; Dilation and curettage of uterus; Gallbladder surgery (2010); Esophagogastroduodenoscopy (11/2008); Colonoscopy (2001); Cholecystectomy; Colonoscopy (10/21/2011); Savory dilation (02/03/2012); Back surgery; Abdominal surgery; Foot surgery; Esophagogastroduodenoscopy (egd) with propofol (N/A, 07/31/2015); Esophageal dilation (N/A, 07/31/2015); biopsy (07/31/2015); Esophagogastroduodenoscopy (egd) with propofol (N/A, 11/13/2015); Esophagogastroduodenoscopy (egd) with propofol (N/A, 10/20/2017); LEFT HEART CATH AND CORONARY ANGIOGRAPHY (N/A, 03/29/2018); Excision of atrial myxoma (N/A, 04/01/2018); TEE without cardioversion (N/A, 04/01/2018); and MITRAL VALVE REPAIR (N/A, 04/01/2018).   Current Medications: Current Meds  Medication Sig  . acetaminophen (TYLENOL) 500 MG tablet Take 500-1,000 mg by mouth every 6 (six) hours  as needed (for fever.).  Marland Kitchen atorvastatin (LIPITOR) 40 MG tablet Take 1 tablet (40 mg total) by mouth daily at 6 PM.  . cetirizine (ZYRTEC) 10 MG tablet Take 10 mg by mouth daily.  . cyclobenzaprine (FLEXERIL) 10 MG tablet Take 10 mg by mouth  3 (three) times daily.   . diphenhydrAMINE (BENADRYL) 25 mg capsule Take 25 mg by mouth every 6 (six) hours as needed (for allergies.).  Marland Kitchen FLUoxetine (PROZAC) 40 MG capsule Take 40 mg by mouth daily.   Marland Kitchen gabapentin (NEURONTIN) 300 MG capsule Take 2 capsules (600 mg total) by mouth 3 (three) times daily.  Marland Kitchen omeprazole (PRILOSEC) 20 MG capsule Take 20 mg by mouth daily.  . ondansetron (ZOFRAN) 4 MG tablet TAKE 1 TABLET(4 MG) BY MOUTH EVERY 8 HOURS AS NEEDED FOR NAUSEA OR VOMITING  . SUBOXONE 8-2 MG FILM Place 1 tablet under the tongue 3 (three) times daily as needed for pain.  Marland Kitchen warfarin (COUMADIN) 2.5 MG tablet Take 1 tablet (2.5 mg total) by mouth daily at 6 PM. INR to be drawn at the coumadin clinic and dosing adjusted by Dr. Acie Fredrickson     Allergies:   Aspirin; Ketorolac tromethamine; Nsaids; Sulfonamide derivatives; Imitrex [sumatriptan base]; and Promethazine hcl   Social History   Tobacco Use  . Smoking status: Current Every Day Smoker    Packs/day: 0.50    Years: 35.00    Pack years: 17.50    Types: Cigarettes  . Smokeless tobacco: Never Used  Substance Use Topics  . Alcohol use: Yes    Comment: occasional holiday drink... pt denied alcohol use on admission to [facility]  . Drug use: Yes    Frequency: 1.0 times per week    Types: Marijuana    Comment: +cocaine on 04/2015 UDS     Family Hx: The patient's family history includes Arthritis in her unknown relative; COPD in her unknown relative; Cancer in her unknown relative; Colon cancer in her maternal aunt, mother, other, and paternal uncle; Heart attack in her father; Heart defect in her unknown relative; Heart failure in her father and mother; Hypertension in her father; Lung cancer in her maternal uncle; Throat cancer in her maternal uncle. There is no history of Anesthesia problems, Hypotension, Malignant hyperthermia, or Pseudochol deficiency.  ROS:   Please see the history of present illness.    Review of Systems  Eyes: Positive  for visual disturbance.  Hematologic/Lymphatic: Bruises/bleeds easily.  Musculoskeletal: Positive for back pain and myalgias.  Gastrointestinal: Positive for constipation.  Neurological: Positive for dizziness.  Psychiatric/Behavioral: The patient is nervous/anxious.    All other systems reviewed and are negative.   EKGs/Labs/Other Test Reviewed:    EKG:  EKG is  ordered today.  The ekg ordered today demonstrates normal sinus rhythm, heart rate 95, normal axis, nonspecific ST-T wave changes, QTC 459  Recent Labs: 03/27/2018: ALT 9; B Natriuretic Peptide 400.0; TSH 1.694 04/02/2018: Magnesium 2.2 04/03/2018: BUN 12; Creatinine, Ser 0.61; Hemoglobin 8.0; Platelets 123; Potassium 3.8; Sodium 136   Recent Lipid Panel Lab Results  Component Value Date/Time   CHOL 204 (H) 03/31/2018 02:33 AM   TRIG 55 03/31/2018 02:33 AM   HDL 60 03/31/2018 02:33 AM   CHOLHDL 3.4 03/31/2018 02:33 AM   LDLCALC 133 (H) 03/31/2018 02:33 AM    Physical Exam:    VS:  BP 110/70   Pulse 95   Ht 5\' 2"  (1.575 m)   Wt 115 lb (52.2 kg)   SpO2 94%  BMI 21.03 kg/m     Wt Readings from Last 3 Encounters:  04/21/18 115 lb (52.2 kg)  04/06/18 117 lb 9.6 oz (53.3 kg)  10/15/17 115 lb (52.2 kg)     Physical Exam  Constitutional: She is oriented to person, place, and time. She appears well-developed and well-nourished. No distress.  HENT:  Head: Normocephalic and atraumatic.  Neck: Neck supple. No JVD present.  Cardiovascular: Normal rate, regular rhythm, S1 normal, S2 normal and normal heart sounds.  No murmur heard. Median sternotomy well healed without erythema or d/c  Pulmonary/Chest: Effort normal. She has no rales.  Abdominal: Soft. There is no hepatomegaly.  Musculoskeletal: She exhibits no edema.  Neurological: She is alert and oriented to person, place, and time.  Skin: Skin is warm and dry.    ASSESSMENT & PLAN:    History of atrial myxoma  Status post resection.  She is doing well.  I  will arrange follow-up echocardiogram.  S/P mitral valve repair She will likely need to remain on Coumadin for 3 months.  She notes a prior history of GI bleeding prior to partial gastrectomy.  She is on PPI therapy.  She should be able to tolerate aspirin after her Coumadin is discontinued.  As noted, follow-up echo will be arranged.  We discussed the importance of SBE prophylaxis.  Coronary artery disease involving native coronary artery of native heart without angina pectoris Mild nonobstructive disease on cardiac catheterization prior to atrial myxoma resection.  She is not on aspirin as she is on Coumadin.  Continue atorvastatin.  Arrange follow-up lipids and LFTs in 3 months.  Tobacco abuse She is currently trying to quit smoking.   Dispo:  Return in about 3 months (around 07/22/2018) for Routine Follow Up w/ MD/APP in [place].   Medication Adjustments/Labs and Tests Ordered: Current medicines are reviewed at length with the patient today.  Concerns regarding medicines are outlined above.  Tests Ordered: Orders Placed This Encounter  Procedures  . Lipid Profile  . Hepatic function panel  . EKG 12-Lead  . ECHOCARDIOGRAM COMPLETE   Medication Changes: No orders of the defined types were placed in this encounter.   Signed, Richardson Dopp, PA-C  04/21/2018 4:07 PM    [facility] [address], [place], [place]  [address] Phone: [phone]; Fax: [phone]

## 2018-04-21 NOTE — Patient Instructions (Signed)
Medication Instructions:  1. Your physician recommends that you continue on your current medications as directed. Please refer to the Current Medication list given to you today.   Labwork: FASTING LIPID AND LIVER PANEL TO BE DONE IN 3 MONTHS IN THE [place] OFFICE AT FOLLOW UP APPT  Testing/Procedures: 1. Your physician has requested that you have an echocardiogram. Echocardiography is a painless test that uses sound waves to create images of your heart. It provides your doctor with information about the size and shape of your heart and how well your heart's chambers and valves are working. This procedure takes approximately one hour. There are no restrictions for this procedure. THIS IS TO BE DONE IN [place] AT [place] IN 2 WEEKS     Follow-Up: PT WOULD LIKE TO FOLLOW UP IN THE [place] LOCATION. THIS WILL BE CLOSER TO HOME. PLEASE SCHEDULE A FOLLOW UP WITH ANY OF THE PROVIDERS IN THE [place] OFFICE TO BE SEEN IN 3 MONTHS   Any Other Special Instructions Will Be Listed Below (If Applicable).     If you need a refill on your cardiac medications before your next appointment, please call your pharmacy.

## 2018-04-22 ENCOUNTER — Telehealth: Payer: Self-pay

## 2018-04-22 NOTE — Telephone Encounter (Signed)
Jacqueline [name] called this morning at stated that she was having some "extra pain" and sorness at the bottom of her incision site and to the right of the incision.  When asked how her incision looked, she stated that it looked well.  She stated that sometimes she forgets and does movements that may have caused some pain.  She stated that when she is not moving she is not in pain, only with movement.  I advised her to not do any lifting, pushing, or pulling to which could potentially injure the chest.  Also, advised her that this pain/ sorness is normal.  She acknowledged receipt.

## 2018-04-26 ENCOUNTER — Telehealth: Payer: Self-pay

## 2018-04-26 ENCOUNTER — Ambulatory Visit (INDEPENDENT_AMBULATORY_CARE_PROVIDER_SITE_OTHER): Payer: Medicaid Other | Admitting: *Deleted

## 2018-04-26 DIAGNOSIS — Z9889 Other specified postprocedural states: Secondary | ICD-10-CM

## 2018-04-26 DIAGNOSIS — Z5181 Encounter for therapeutic drug level monitoring: Secondary | ICD-10-CM | POA: Diagnosis not present

## 2018-04-26 LAB — POCT INR: INR: 2.3

## 2018-04-26 MED ORDER — WARFARIN SODIUM 2.5 MG PO TABS: ORAL_TABLET | ORAL | 3 refills | Status: DC

## 2018-04-26 NOTE — Telephone Encounter (Addendum)
Jacqueline [name] called this morning and stated that she was again having some soreness/ pain in her chest even when taking suboxone and tylenol s/p Myxoma resection and MVR 04/01/2018.  I explained that due to the nature of the surgery, it will take time to heal and for the soreness to go away.  She also stated that she was going through what felt like "post-partum depression" and requested a refill of her Prozac.  I advised her to call her PCP and let them know her feelings and she stated that she did not want to contact them because she felt they "do not do anything for me".  I advised her to call. She stated that she would and is trying to get another PCP but it is "hard" because she "has medicaid".  I also reminded her of her appointment with Dr. Roxan Hockey next week, and she acknowledged receipt.

## 2018-04-26 NOTE — Patient Instructions (Signed)
Increase coumadin to 1 1/2 tablets daily except 1 tablet on Sundays  Recheck in 2 weeks

## 2018-05-03 ENCOUNTER — Other Ambulatory Visit: Payer: Self-pay | Admitting: Thoracic Surgery (Cardiothoracic Vascular Surgery)

## 2018-05-03 DIAGNOSIS — I2510 Atherosclerotic heart disease of native coronary artery without angina pectoris: Secondary | ICD-10-CM

## 2018-05-04 ENCOUNTER — Ambulatory Visit (INDEPENDENT_AMBULATORY_CARE_PROVIDER_SITE_OTHER): Payer: Self-pay | Admitting: Thoracic Surgery (Cardiothoracic Vascular Surgery)

## 2018-05-04 ENCOUNTER — Encounter: Payer: Self-pay | Admitting: Thoracic Surgery (Cardiothoracic Vascular Surgery)

## 2018-05-04 ENCOUNTER — Ambulatory Visit
Admission: RE | Admit: 2018-05-04 | Discharge: 2018-05-04 | Disposition: A | Discharge status: Home / Self Care | Payer: Medicaid Other | Source: Ambulatory Visit | Attending: Thoracic Surgery (Cardiothoracic Vascular Surgery) | Admitting: Thoracic Surgery (Cardiothoracic Vascular Surgery)

## 2018-05-04 ENCOUNTER — Other Ambulatory Visit: Payer: Self-pay

## 2018-05-04 VITALS — BP 138/88 | HR 90 | Resp 18 | Ht 62.0 in | Wt 118.4 lb

## 2018-05-04 DIAGNOSIS — I2510 Atherosclerotic heart disease of native coronary artery without angina pectoris: Secondary | ICD-10-CM

## 2018-05-04 DIAGNOSIS — Z9889 Other specified postprocedural states: Secondary | ICD-10-CM

## 2018-05-04 DIAGNOSIS — D151 Benign neoplasm of heart: Secondary | ICD-10-CM | POA: Insufficient documentation

## 2018-05-04 NOTE — Progress Notes (Signed)
[address]       [place] [address]             [phone]     HPI: Mrs. Montuori returns for a scheduled postoperative follow-up  Charmon Thorson is a 67 year old woman with a history of tobacco abuse, anxiety, migraines, chronic back pain, gastric tumor, reflux, and IBS.  She presented with shortness of breath and dizziness but no frank syncope.  On the day of admission she had severe fatigue and weakness and a sensation of heaviness in her chest.  She ruled out for MI but an echo showed a large left atrial myxoma.  I did a resection of the left atrial myxoma and a mitral valve annuloplasty on 04/01/2018.  She had some atrial fibrillation postoperatively and was started on Coumadin.  She overall did well and went home on day 5.  She continues to do well.  She is not having any dizziness or shortness of breath.  She does have some incisional pain.  She is taking Tylenol for that.  She has not had any swelling in her legs.  Past Medical History:  Diagnosis Date  . Anxiety   . CAD (coronary artery disease) 04/21/2018   LHC 4/19: no sig CAD, pLAD 40  . Chronic back pain   . Drug-seeking behavior   . Gastric nodule 2009   EUS, ?leiomyoma  . 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 GERD (gastroesophageal reflux disease)   . Gout   . History of pancreatitis    Biliary and/or etoh?  . Irritable bowel syndrome   . Knee pain   . Migraines   . Migraines   . Peripheral neuropathy   . Polysubstance abuse (HCC)    opiates, cocaine, marijuana    Current Outpatient Medications  Medication Sig Dispense Refill  . acetaminophen (TYLENOL) 500 MG tablet Take 500-1,000 mg by mouth every 6 (six) hours as needed (for fever.).    Marland Kitchen atorvastatin (LIPITOR) 40 MG tablet Take 1 tablet (40 mg total) by mouth daily at 6 PM. 30 tablet 1  . cetirizine (ZYRTEC) 10 MG tablet Take 10 mg by mouth  daily.    . cyclobenzaprine (FLEXERIL) 10 MG tablet Take 10 mg by mouth 3 (three) times daily.   0  . diphenhydrAMINE (BENADRYL) 25 mg capsule Take 25 mg by mouth every 6 (six) hours as needed (for allergies.).    Marland Kitchen FLUoxetine (PROZAC) 40 MG capsule Take 40 mg by mouth daily.   0  . gabapentin (NEURONTIN) 300 MG capsule Take 2 capsules (600 mg total) by mouth 3 (three) times daily. 180 capsule 0  . omeprazole (PRILOSEC) 20 MG capsule Take 20 mg by mouth daily.    . ondansetron (ZOFRAN) 4 MG tablet TAKE 1 TABLET(4 MG) BY MOUTH EVERY 8 HOURS AS NEEDED FOR NAUSEA OR VOMITING 30 tablet 0  . SUBOXONE 8-2 MG FILM Place 1 tablet under the tongue 3 (three) times daily as needed for pain.  0  . warfarin (COUMADIN) 2.5 MG tablet Take 1 1/2 tablets daily except 1 tablet on Sundays or as directed 45 tablet 3   No current facility-administered medications for this visit.     Physical Exam BP 138/88   Pulse 90   Resp 18   Ht 5\' 2"  (1.575 m)   Wt 118 lb 6.4 oz (53.7 kg)  SpO2 97% Comment: RA  BMI 21.30 kg/m  67 year old woman in no acute distress Alert and oriented x3 with no focal deficits Lungs clear with equal breath sounds bilaterally Cardiac regular rate and rhythm normal S1 and S2 no rubs or murmurs No peripheral edema Sternal incision clean dry and intact, sternum stable  Diagnostic Tests: CHEST - 2 VIEW  COMPARISON:  April 03, 2018  FINDINGS: There is no edema or consolidation. The heart size and pulmonary vascularity are normal. Patient is status post mitral valve replacement. No adenopathy. There is degenerative change in the thoracic spine. No pneumothorax.  IMPRESSION: Status post mitral valve replacement. Heart size normal. No pneumothorax. Lungs clear.   Electronically Signed   By: Lowella Grip III M.D.   On: 05/04/2018 10:35  I personally reviewed the chest x-ray and agree with the findings as noted above with the exception that she had an annuloplasty not  a mitral valve replacement.  Impression: Mrs. Kates is a 67 year old woman who presented with fatigue, weakness, shortness of breath, and dizziness.  She was found to have a large left atrial myxoma essentially occupying the entire left atrium.  She underwent resection of the left atrial myxoma on 04/02/2018.  The time of surgery there was some posterior annular dilatation of the mitral valve and an annuloplasty was done well.  She is doing extremely well in the early postoperative period.  She does have some incisional pain, but is only requiring acetaminophen for that.  She is not taking any narcotics at this point.  She should not lift anything over 10 pounds for another 2 weeks.  Beyond that her activities are unrestricted.  She does not currently have a driver's license.  She is on warfarin.  That is being managed in the [place] [facility].  She should be able to come off of that at 3 months postop unless she has rhythm issues.  Beyond that she should be on a baby aspirin a day.  Plan: Follow-up with cardiology  I will be happy to see her back anytime the future if I can be of any further assistance with her care  Melrose Nakayama, MD [facility] [phone]

## 2018-05-05 ENCOUNTER — Ambulatory Visit (HOSPITAL_COMMUNITY)
Admission: RE | Admit: 2018-05-05 | Discharge: 2018-05-05 | Disposition: A | Discharge status: Home / Self Care | Payer: Medicaid Other | Source: Ambulatory Visit | Attending: Physician Assistant | Admitting: Physician Assistant

## 2018-05-05 DIAGNOSIS — Z86018 Personal history of other benign neoplasm: Secondary | ICD-10-CM

## 2018-05-05 DIAGNOSIS — Z9889 Other specified postprocedural states: Secondary | ICD-10-CM

## 2018-05-10 ENCOUNTER — Ambulatory Visit (INDEPENDENT_AMBULATORY_CARE_PROVIDER_SITE_OTHER): Payer: Medicaid Other | Admitting: *Deleted

## 2018-05-10 DIAGNOSIS — Z9889 Other specified postprocedural states: Secondary | ICD-10-CM

## 2018-05-10 DIAGNOSIS — Z5181 Encounter for therapeutic drug level monitoring: Secondary | ICD-10-CM | POA: Diagnosis not present

## 2018-05-10 LAB — POCT INR
INR POC, (Mech Heart Valve Range): 2.8 (ref 2.5–3.5)
INR: 2.8 (ref 2.0–3.0)

## 2018-05-10 NOTE — Patient Instructions (Signed)
Continue coumadin 1 1/2 tablets daily except 1 tablet on Sundays Recheck in 2 weeks 

## 2018-05-12 ENCOUNTER — Other Ambulatory Visit (HOSPITAL_COMMUNITY): Payer: Medicaid Other

## 2018-05-20 ENCOUNTER — Ambulatory Visit (HOSPITAL_COMMUNITY)
Admission: RE | Admit: 2018-05-20 | Discharge: 2018-05-20 | Disposition: A | Discharge status: Home / Self Care | Payer: Medicaid Other | Source: Ambulatory Visit | Attending: Physician Assistant | Admitting: Physician Assistant

## 2018-05-20 ENCOUNTER — Telehealth: Payer: Self-pay | Admitting: *Deleted

## 2018-05-20 ENCOUNTER — Encounter: Payer: Self-pay | Admitting: Physician Assistant

## 2018-05-20 DIAGNOSIS — I10 Essential (primary) hypertension: Secondary | ICD-10-CM | POA: Insufficient documentation

## 2018-05-20 DIAGNOSIS — I2510 Atherosclerotic heart disease of native coronary artery without angina pectoris: Secondary | ICD-10-CM | POA: Diagnosis not present

## 2018-05-20 DIAGNOSIS — Z86018 Personal history of other benign neoplasm: Secondary | ICD-10-CM | POA: Diagnosis not present

## 2018-05-20 DIAGNOSIS — F1910 Other psychoactive substance abuse, uncomplicated: Secondary | ICD-10-CM | POA: Diagnosis not present

## 2018-05-20 DIAGNOSIS — Z9889 Other specified postprocedural states: Secondary | ICD-10-CM | POA: Diagnosis not present

## 2018-05-20 DIAGNOSIS — J449 Chronic obstructive pulmonary disease, unspecified: Secondary | ICD-10-CM | POA: Insufficient documentation

## 2018-05-20 DIAGNOSIS — F4310 Post-traumatic stress disorder, unspecified: Secondary | ICD-10-CM | POA: Insufficient documentation

## 2018-05-20 NOTE — Progress Notes (Signed)
*  PRELIMINARY RESULTS* Echocardiogram 2D Echocardiogram has been performed.  Leavy Cella 05/20/2018, 11:19 AM

## 2018-05-20 NOTE — Telephone Encounter (Signed)
-----   Message from Liliane Shi, [place] sent at 05/20/2018  5:22 PM EDT ----- Please call the patient. The echocardiogram demonstrates normal heart function.  The mitral valve appears stable. Continue current medications and follow up as planned.  Please fax a copy to PCP:  Neale Burly, MD  Richardson Dopp, PA-C    05/20/2018 5:21 PM

## 2018-05-20 NOTE — Telephone Encounter (Signed)
Left message to go over echo results. 

## 2018-05-21 NOTE — Telephone Encounter (Signed)
-----   Message from Liliane Shi, [place] sent at 05/20/2018  5:22 PM EDT ----- Please call the patient. The echocardiogram demonstrates normal heart function.  The mitral valve appears stable. Continue current medications and follow up as planned.  Please fax a copy to PCP:  Neale Burly, MD  Richardson Dopp, PA-C    05/20/2018 5:21 PM

## 2018-05-21 NOTE — Telephone Encounter (Signed)
Pt has been notified of echo results by phone with verbal understanding. Pt thanked me for the call.  

## 2018-05-31 ENCOUNTER — Ambulatory Visit (INDEPENDENT_AMBULATORY_CARE_PROVIDER_SITE_OTHER): Payer: Medicaid Other | Admitting: *Deleted

## 2018-05-31 DIAGNOSIS — Z9889 Other specified postprocedural states: Secondary | ICD-10-CM | POA: Diagnosis not present

## 2018-05-31 DIAGNOSIS — Z5181 Encounter for therapeutic drug level monitoring: Secondary | ICD-10-CM | POA: Diagnosis not present

## 2018-05-31 LAB — POCT INR: INR: 1.3 — AB (ref 2.0–3.0)

## 2018-05-31 NOTE — Patient Instructions (Signed)
Take 2 tablets x 4 days then increase dose to 1 1/2 tablets daily Recheck in 1 week

## 2018-06-07 ENCOUNTER — Ambulatory Visit (INDEPENDENT_AMBULATORY_CARE_PROVIDER_SITE_OTHER): Payer: Medicaid Other | Admitting: *Deleted

## 2018-06-07 DIAGNOSIS — Z9889 Other specified postprocedural states: Secondary | ICD-10-CM | POA: Diagnosis not present

## 2018-06-07 DIAGNOSIS — Z5181 Encounter for therapeutic drug level monitoring: Secondary | ICD-10-CM

## 2018-06-07 LAB — POCT INR: INR: 1.9 — AB (ref 2.0–3.0)

## 2018-06-07 MED ORDER — ATORVASTATIN CALCIUM 40 MG PO TABS: 40.0000 mg | ORAL_TABLET | Freq: Every day | ORAL | 6 refills | Status: AC

## 2018-06-07 NOTE — Patient Instructions (Signed)
Increase coumadin to 2 tablets daily Recheck 6/20 in [place]

## 2018-06-10 ENCOUNTER — Ambulatory Visit (INDEPENDENT_AMBULATORY_CARE_PROVIDER_SITE_OTHER): Payer: Medicaid Other | Admitting: *Deleted

## 2018-06-10 DIAGNOSIS — Z5181 Encounter for therapeutic drug level monitoring: Secondary | ICD-10-CM

## 2018-06-10 DIAGNOSIS — Z9889 Other specified postprocedural states: Secondary | ICD-10-CM | POA: Diagnosis not present

## 2018-06-10 LAB — POCT INR: INR: 2.3 (ref 2.0–3.0)

## 2018-06-10 NOTE — Patient Instructions (Signed)
Start 2 tablets daily except 1 1/2 tablets on Mondays, Wednesdays and Fridays Recheck in 2 wks

## 2018-06-28 ENCOUNTER — Ambulatory Visit (INDEPENDENT_AMBULATORY_CARE_PROVIDER_SITE_OTHER): Payer: Medicaid Other | Admitting: *Deleted

## 2018-06-28 DIAGNOSIS — Z5181 Encounter for therapeutic drug level monitoring: Secondary | ICD-10-CM

## 2018-06-28 DIAGNOSIS — Z9889 Other specified postprocedural states: Secondary | ICD-10-CM

## 2018-06-28 LAB — POCT INR: INR: 1.9 — AB (ref 2.0–3.0)

## 2018-06-28 NOTE — Patient Instructions (Signed)
Take coumadin 2 1/2 tablets tonight then increase dose to 2 tablets daily Recheck in 2 wks

## 2018-07-09 ENCOUNTER — Ambulatory Visit (INDEPENDENT_AMBULATORY_CARE_PROVIDER_SITE_OTHER): Payer: Medicaid Other | Admitting: *Deleted

## 2018-07-09 DIAGNOSIS — Z9889 Other specified postprocedural states: Secondary | ICD-10-CM | POA: Diagnosis not present

## 2018-07-09 DIAGNOSIS — Z5181 Encounter for therapeutic drug level monitoring: Secondary | ICD-10-CM

## 2018-07-09 LAB — POCT INR: INR: 2.3 (ref 2.0–3.0)

## 2018-07-09 NOTE — Patient Instructions (Signed)
Continue coumadin 2 tablets daily Recheck in 2 wks

## 2018-07-16 ENCOUNTER — Encounter (HOSPITAL_COMMUNITY): Payer: Self-pay | Admitting: Emergency Medicine

## 2018-07-16 ENCOUNTER — Emergency Department (HOSPITAL_COMMUNITY)
Admission: EM | Admit: 2018-07-16 | Discharge: 2018-07-16 | Disposition: A | Discharge status: Home / Self Care | Payer: Medicaid Other | Attending: Emergency Medicine | Admitting: Emergency Medicine

## 2018-07-16 ENCOUNTER — Other Ambulatory Visit: Payer: Self-pay

## 2018-07-16 ENCOUNTER — Emergency Department (HOSPITAL_COMMUNITY): Payer: Medicaid Other

## 2018-07-16 DIAGNOSIS — K921 Melena: Secondary | ICD-10-CM | POA: Insufficient documentation

## 2018-07-16 DIAGNOSIS — R531 Weakness: Secondary | ICD-10-CM | POA: Diagnosis not present

## 2018-07-16 DIAGNOSIS — F1310 Sedative, hypnotic or anxiolytic abuse, uncomplicated: Secondary | ICD-10-CM | POA: Diagnosis not present

## 2018-07-16 DIAGNOSIS — F1290 Cannabis use, unspecified, uncomplicated: Secondary | ICD-10-CM | POA: Insufficient documentation

## 2018-07-16 DIAGNOSIS — R42 Dizziness and giddiness: Secondary | ICD-10-CM | POA: Insufficient documentation

## 2018-07-16 DIAGNOSIS — Z79899 Other long term (current) drug therapy: Secondary | ICD-10-CM | POA: Insufficient documentation

## 2018-07-16 DIAGNOSIS — J449 Chronic obstructive pulmonary disease, unspecified: Secondary | ICD-10-CM | POA: Diagnosis not present

## 2018-07-16 DIAGNOSIS — Z7901 Long term (current) use of anticoagulants: Secondary | ICD-10-CM | POA: Diagnosis not present

## 2018-07-16 DIAGNOSIS — F17210 Nicotine dependence, cigarettes, uncomplicated: Secondary | ICD-10-CM | POA: Insufficient documentation

## 2018-07-16 DIAGNOSIS — I2510 Atherosclerotic heart disease of native coronary artery without angina pectoris: Secondary | ICD-10-CM | POA: Insufficient documentation

## 2018-07-16 DIAGNOSIS — I10 Essential (primary) hypertension: Secondary | ICD-10-CM | POA: Diagnosis not present

## 2018-07-16 LAB — COMPREHENSIVE METABOLIC PANEL
ALBUMIN: 3.4 g/dL — AB (ref 3.5–5.0)
ALT: 10 U/L (ref 0–44)
AST: 20 U/L (ref 15–41)
Alkaline Phosphatase: 70 U/L (ref 38–126)
Anion gap: 7 (ref 5–15)
BUN: 5 mg/dL — AB (ref 8–23)
CHLORIDE: 103 mmol/L (ref 98–111)
CO2: 28 mmol/L (ref 22–32)
Calcium: 8.5 mg/dL — ABNORMAL LOW (ref 8.9–10.3)
Creatinine, Ser: 0.61 mg/dL (ref 0.44–1.00)
GFR calc Af Amer: >60 mL/min (ref >60)
GFR calc non Af Amer: >60 mL/min (ref >60)
GLUCOSE: 93 mg/dL (ref 70–99)
POTASSIUM: 3.6 mmol/L (ref 3.5–5.1)
Sodium: 138 mmol/L (ref 135–145)
Total Bilirubin: 0.3 mg/dL (ref 0.3–1.2)
Total Protein: 6.4 g/dL — ABNORMAL LOW (ref 6.5–8.1)

## 2018-07-16 LAB — URINALYSIS, ROUTINE W REFLEX MICROSCOPIC
Bilirubin Urine: NEGATIVE
GLUCOSE, UA: NEGATIVE mg/dL
KETONES UR: NEGATIVE mg/dL
Nitrite: NEGATIVE
PROTEIN: NEGATIVE mg/dL
Specific Gravity, Urine: 1.002 — ABNORMAL LOW (ref 1.005–1.030)
pH: 7 (ref 5.0–8.0)

## 2018-07-16 LAB — RAPID URINE DRUG SCREEN, HOSP PERFORMED
Amphetamines: NOT DETECTED
BARBITURATES: NOT DETECTED
Benzodiazepines: POSITIVE — AB
Cocaine: NOT DETECTED
OPIATES: NOT DETECTED
TETRAHYDROCANNABINOL: POSITIVE — AB

## 2018-07-16 LAB — CBC WITH DIFFERENTIAL/PLATELET
BASOS ABS: 0 10*3/uL (ref 0.0–0.1)
Basophils Relative: 0 %
Eosinophils Absolute: 0.2 10*3/uL (ref 0.0–0.7)
Eosinophils Relative: 2 %
HEMATOCRIT: 34.3 % — AB (ref 36.0–46.0)
Hemoglobin: 11 g/dL — ABNORMAL LOW (ref 12.0–15.0)
LYMPHS ABS: 1.9 10*3/uL (ref 0.7–4.0)
LYMPHS PCT: 25 %
MCH: 28.9 pg (ref 26.0–34.0)
MCHC: 32.1 g/dL (ref 30.0–36.0)
MCV: 90.3 fL (ref 78.0–100.0)
MONO ABS: 0.3 10*3/uL (ref 0.1–1.0)
MONOS PCT: 4 %
NEUTROS ABS: 5.2 10*3/uL (ref 1.7–7.7)
Neutrophils Relative %: 69 %
Platelets: 230 10*3/uL (ref 150–400)
RBC: 3.8 MIL/uL — ABNORMAL LOW (ref 3.87–5.11)
RDW: 15.2 % (ref 11.5–15.5)
WBC: 7.6 10*3/uL (ref 4.0–10.5)

## 2018-07-16 LAB — SAMPLE TO BLOOD BANK

## 2018-07-16 LAB — PROTIME-INR
INR: 1.88
Prothrombin Time: 21.5 seconds — ABNORMAL HIGH (ref 11.4–15.2)

## 2018-07-16 LAB — TROPONIN I: Troponin I: <0.03 ng/mL (ref <0.03)

## 2018-07-16 MED ORDER — SODIUM CHLORIDE 0.9 % IV BOLUS
1000.0000 mL | Freq: Once | INTRAVENOUS | Status: AC
  Administered 2018-07-16: 1000 mL via INTRAVENOUS

## 2018-07-16 NOTE — ED Notes (Signed)
Pt walked well by herself. Pt just stated she was a little dizzy.

## 2018-07-16 NOTE — ED Triage Notes (Signed)
Patient complaining of dizziness x 2 weeks and states she was seen at PCP today and had blood in stool.

## 2018-07-16 NOTE — Discharge Instructions (Signed)
Take your usual prescriptions as previously directed.  Do not take any medication that is not prescribed for you. Increase your fluid intake (ie:  Gatoraide) for the next few days. Call your regular medical doctor today to schedule a follow up appointment in the next 3 days. Call your GI doctor today to schedule a follow up appointment next week.  Return to the Emergency Department immediately sooner if worsening.

## 2018-07-16 NOTE — ED Provider Notes (Signed)
[facility] EMERGENCY DEPARTMENT Provider Note   CSN: [id number] Arrival date & time: 07/16/18  1027     History   Chief Complaint Chief Complaint  Patient presents with  . Dizziness    HPI Jacqueline [name] is a 67 y.o. female.  HPI  Pt was seen at 1105.  Per pt, c/o gradual onset and persistence of constant generalized fatigue for the past several weeks. Has been associated with "black stools." Pt states she was evaluated by her PMD today, was told her stool occult blood test "was positive" and her "BP was low." Pt was then sent to the ED for further evaluation. Denies abd pain, no N/V/D, no CP/SOB, no back pain, no focal motor weakness, no tingling/numbness in extremities, no blood in stools.    GI: [place] Past Medical History:  Diagnosis Date  . Anxiety   . Atrial myxoma    Left atrial  . CAD (coronary artery disease) 04/21/2018   LHC 4/19: no sig CAD, pLAD 40  . Chronic back pain   . Drug-seeking behavior   . Gastric nodule 2009   EUS, ?leiomyoma  . 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 GERD (gastroesophageal reflux disease)   . Gout   . History of pancreatitis    Biliary and/or etoh?  . Irritable bowel syndrome   . Knee pain   . Migraines   . Migraines   . Peripheral neuropathy   . Polysubstance abuse (HCC)    opiates, cocaine, marijuana  . Status post mitral valve repair    Echo 5/19: Mild LVH, EF 60-65, normal wall motion, status post mitral valve repair (mean gradient 4), mild LAE, mildly reduced RVSF    Patient Active Problem List   Diagnosis Date Noted  . Atrial myxoma   . CAD (coronary artery disease) 04/21/2018  . Encounter for therapeutic drug monitoring 04/12/2018  . S/P mitral valve repair 04/02/2018  . COPD with chronic bronchitis ([place]) 03/29/2018  . History of atrial myxoma   . Chronic pain syndrome 03/27/2018  . Tobacco abuse 03/27/2018  . HTN  (hypertension) 03/27/2018  . Constipation 10/06/2017  . GERD (gastroesophageal reflux disease) 09/26/2016  . PUD (peptic ulcer disease) 10/01/2015  . Nausea with vomiting 10/01/2015  . Loss of weight 10/01/2015  . Dizzy 07/16/2015  . Major depressive disorder, recurrent, severe without psychotic features ([place])   . Substance induced mood disorder ([place]) 04/24/2015  . Opioid type dependence, continuous ([place]) 04/24/2015  . Severe major depression without psychotic features ([place]) 04/24/2015  . PTSD (post-traumatic stress disorder) 04/24/2015  . Chest pain, localized 04/28/2012  . Odynophagia 09/30/2011  . Colon cancer screening 09/30/2011  . Gastric tumor 09/30/2011  . CLOSED FRACTURE OF UPPER END OF FIBULA 07/31/2010  . Dysphagia, idiopathic 07/17/2009  . ABDOMINAL PAIN, GENERALIZED 07/17/2009  . GASTRITIS 07/12/2009  . IBS 07/12/2009  . RUQ PAIN 07/12/2009  . EPIGASTRIC PAIN 07/12/2009  . PANCREATITIS, ACUTE, HX OF 07/12/2009  . Osteoarthrosis, unspecified whether generalized or localized, hand 11/09/2008  . OSTEOARTHRITIS, LOWER LEG 10/25/2008  . DERANGEMENT MENISCUS 10/16/2008  . KNEE PAIN 10/16/2008    Past Surgical History:  Procedure Laterality Date  . ABDOMINAL HYSTERECTOMY    . ABDOMINAL SURGERY    . BACK SURGERY    . back surgery /ray cage fusion comberg, gso    . BIOPSY  07/31/2015   Procedure: BIOPSY (DUODENAL, GASTRIC, GASTRIC ULCER,  ESOPHAGEAL);  Surgeon: Danie Binder, MD;  Location: [facility];  Service: Endoscopy;;  . CESAREAN SECTION    . CHOLECYSTECTOMY    . COLONOSCOPY  2001   Dr. Laural Golden, hemorrhoids  . COLONOSCOPY  10/21/2011   IDP:OEUMPNT polyp multiple/internal hemorrhoids  . DILATION AND CURETTAGE OF UTERUS    . ESOPHAGEAL DILATION N/A 07/31/2015   Procedure: ESOPHAGEAL DILATION WIRE GUIDED WITH SAVORY DILATORS 15MM, 16MM;  Surgeon: Danie Binder, MD;  Location: [facility];  Service: Endoscopy;  Laterality: N/A;  . ESOPHAGOGASTRODUODENOSCOPY  11/2008   A 9-mm  submucosal lesion seen in the cardia., gastritis, no hpylori  . ESOPHAGOGASTRODUODENOSCOPY (EGD) WITH PROPOFOL N/A 07/31/2015   IRW:ERXVQMG ulcer and moderate gastritis, dysphagia, empirical dilation with no identified source. esophageal, gastric, duodenal bx unremarkable.   . ESOPHAGOGASTRODUODENOSCOPY (EGD) WITH PROPOFOL N/A 11/13/2015   SLF: 1. Gastric ulcer has healed. 2. single non-bleeding gastric AVMs 3. mild non-erosive gastritis.   Marland Kitchen ESOPHAGOGASTRODUODENOSCOPY (EGD) WITH PROPOFOL N/A 10/20/2017   Procedure: ESOPHAGOGASTRODUODENOSCOPY (EGD) WITH PROPOFOL;  Surgeon: Danie Binder, MD;  Location: [facility];  Service: Endoscopy;  Laterality: N/A;  2:00pm  . EXCISION OF ATRIAL MYXOMA N/A 04/01/2018   Procedure: RESECTION OF LEFT ATRIAL MYXOMA;  Surgeon: Melrose Nakayama, MD;  Location: [facility];  Service: Open Heart Surgery;  Laterality: N/A;  . FOOT SURGERY    . GALLBLADDER SURGERY  2010  . LEFT HEART CATH AND CORONARY ANGIOGRAPHY N/A 03/29/2018   Procedure: LEFT HEART CATH AND CORONARY ANGIOGRAPHY;  Surgeon: Lorretta Harp, MD;  Location: [facility];  Service: Cardiovascular;  Laterality: N/A;  . MITRAL VALVE REPAIR N/A 04/01/2018   Procedure: MITRAL VALVE REPAIR.;  Surgeon: Melrose Nakayama, MD;  Location: [facility];  Service: Open Heart Surgery;  Laterality: N/A;  Mitral valve annuloplasty   . 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  Marland Kitchen SAVORY DILATION  02/03/2012   QQP:[id number] in the distal esophagus/Mild gastritis  . TEE WITHOUT CARDIOVERSION N/A 04/01/2018   Procedure: TRANSESOPHAGEAL ECHOCARDIOGRAM (TEE);  Surgeon: Melrose Nakayama, MD;  Location: [facility];  Service: Open Heart Surgery;  Laterality: N/A;  . toe graft    . TONSILLECTOMY       OB History    Gravida  3   Para  2   Term  2   Preterm        AB  1   Living  2     SAB  1   TAB      Ectopic      Multiple      Live Births               Home Medications    Prior to Admission medications   Medication Sig Start Date End Date Taking? Authorizing Provider  acetaminophen (TYLENOL) 500 MG tablet Take 500-1,000 mg by mouth every 6 (six) hours as needed (for fever.).   Yes [provider]  atorvastatin (LIPITOR) 40 MG tablet Take 1 tablet (40 mg total) by mouth daily at 6 PM. 06/07/18  Yes Herminio Commons, MD  cetirizine (ZYRTEC) 10 MG tablet Take 10 mg by mouth daily.   Yes [provider]  cyclobenzaprine (FLEXERIL) 10 MG tablet Take 10 mg by mouth 3 (three) times daily.  09/22/17  Yes [provider]  diphenhydrAMINE (BENADRYL) 25 mg capsule Take 25 mg by mouth every 6 (six) hours as needed (for allergies.).   Yes [provider]  FLUoxetine (PROZAC) 40 MG capsule Take 40 mg by mouth daily.  09/22/17  Yes [provider]  gabapentin (NEURONTIN) 300 MG capsule Take 2 capsules (600 mg total) by mouth 3 (three) times daily. 04/30/15  Yes Niel Hummer, NP  omeprazole (PRILOSEC) 20 MG capsule Take 20 mg by mouth daily.   Yes [provider]  SUBOXONE 8-2 MG FILM Place 1 tablet under the tongue 3 (three) times daily as needed for pain. 04/20/18  Yes [provider]  warfarin (COUMADIN) 2.5 MG tablet Take 1 1/2 tablets daily except 1 tablet on Sundays or as directed 04/26/18  Yes Herminio Commons, MD  ondansetron (ZOFRAN) 4 MG tablet TAKE 1 TABLET(4 MG) BY MOUTH EVERY 8 HOURS AS NEEDED FOR NAUSEA OR VOMITING 11/11/17   Annitta Needs, NP    Family History Family History  Problem Relation Age of Onset  . Colon cancer Other   . Colon cancer Mother        diagnosed in late 59s and died age 64  . Heart failure Mother   . Heart defect Unknown        family history   . Arthritis Unknown        family history  . COPD Unknown        family history   . Cancer  Unknown        multiple unknown type  . Heart attack Father        deceased at 68  . Hypertension Father   . Heart failure Father   . Lung cancer Maternal Uncle   . Throat cancer Maternal Uncle   . Colon cancer Paternal Uncle   . Colon cancer Maternal Aunt   . Anesthesia problems Neg Hx   . Hypotension Neg Hx   . Malignant hyperthermia Neg Hx   . Pseudochol deficiency Neg Hx     Social History Social History   Tobacco Use  . Smoking status: Current Every Day Smoker    Packs/day: 0.50    Years: 35.00    Pack years: 17.50    Types: Cigarettes  . Smokeless tobacco: Never Used  Substance Use Topics  . Alcohol use: Yes    Comment: occasional holiday drink... pt denied alcohol use on admission to [facility]  . Drug use: Yes    Frequency: 1.0 times per week    Types: Marijuana    Comment: +cocaine on 04/2015 UDS     Allergies   Aspirin; Ketorolac tromethamine; Nsaids; Sulfonamide derivatives; Imitrex [sumatriptan base]; and Promethazine hcl   Review of Systems Review of Systems ROS: Statement: All systems negative except as marked or noted in the HPI; Constitutional: Negative for fever and chills. ; ; Eyes: Negative for eye pain, redness and discharge. ; ; ENMT: Negative for ear pain, hoarseness, nasal congestion, sinus pressure and sore throat. ; ; Cardiovascular: Negative for chest pain, palpitations, diaphoresis, dyspnea and peripheral edema. ; ; Respiratory: Negative for cough, wheezing and stridor. ; ; Gastrointestinal: +"black stools." Negative for nausea, vomiting, diarrhea, abdominal pain, blood in stool, hematemesis, jaundice and rectal bleeding. . ; ; Genitourinary: Negative for dysuria, flank pain and hematuria. ; ; Musculoskeletal: Negative for back pain and neck pain. Negative for swelling and trauma.; ; Skin: Negative for pruritus, rash, abrasions, blisters, bruising and skin lesion.; ; Neuro: +lightheadedness,  generalized fatigue. Negative for headache and neck stiffness.  Negative for altered level of consciousness, altered mental status, extremity weakness, paresthesias, involuntary movement, seizure and syncope.       Physical Exam Updated Vital Signs BP (!) 154/96 (BP Location: Right Arm)   Pulse 90   Temp 98.6 F (37 C) (Oral)   Resp 16   Ht 5\' 2"  (1.575 m)   Wt 53.5 kg (118 lb)   SpO2 98%   BMI 21.58 kg/m    Patient Vitals for the past 24 hrs:  BP Temp Temp src Pulse Resp SpO2 Height Weight  07/16/18 1459 (!) 154/96 - - 90 16 98 % - -  07/16/18 1100 96/66 - - 82 - 93 % - -  07/16/18 1049 - - - - - - 5\' 2"  (1.575 m) 53.5 kg (118 lb)  07/16/18 1048 115/74 98.6 F (37 C) Oral 86 18 96 % - -     10:58:15 Orthostatic Vital Signs RE  Orthostatic Lying   BP- Lying: 135/89   Pulse- Lying: 81       Orthostatic Sitting  BP- Sitting: 87/70Abnormal    Pulse- Sitting: 84       Orthostatic Standing at 0 minutes  BP- Standing at 0 minutes: 96/66   Pulse- Standing at 0 minutes: 83      15:42 Orthostatic Vital Signs TH  Orthostatic Lying   BP- Lying: 161/107Abnormal    Pulse- Lying: 71       Orthostatic Sitting  BP- Sitting: 158/97Abnormal    Pulse- Sitting: 72       Orthostatic Standing at 0 minutes  BP- Standing at 0 minutes: 125/82   Pulse- Standing at 0 minutes: 72      Physical Exam 1110: Physical examination:  Nursing notes reviewed; Vital signs and O2 SAT reviewed;  Constitutional: Well developed, Well nourished, Well hydrated, In no acute distress; Head:  Normocephalic, atraumatic; Eyes: EOMI, PERRL, No scleral icterus; ENMT: Mouth and pharynx normal, Mucous membranes moist; Neck: Supple, Full range of motion, No lymphadenopathy; Cardiovascular: Regular rate and rhythm, No gallop; Respiratory: Breath sounds clear & equal bilaterally, No wheezes.  Speaking full sentences with ease, Normal respiratory effort/excursion; Chest: Nontender, Movement normal; Abdomen: Soft, Nontender, Nondistended, Normal bowel sounds;  Genitourinary: No CVA tenderness; Extremities: Peripheral pulses normal, No tenderness, No edema, No calf edema or asymmetry.; Neuro: AA&Ox3, Major CN grossly intact.  Speech clear. No gross focal motor or sensory deficits in extremities.; Skin: Color normal, Warm, Dry.   ED Treatments / Results  Labs (all labs ordered are listed, but only abnormal results are displayed)   EKG EKG Interpretation  Date/Time:  Friday July 16 2018 11:48:37 EDT Ventricular Rate:  66 PR Interval:    QRS Duration: 102 QT Interval:  438 QTC Calculation: 459 R Axis:   78 Text Interpretation:  Sinus rhythm RSR' in V1 or V2, right VCD or RVH When compared with ECG of 04/02/2018 No significant change was found Confirmed by Francine Graven [phone]) on 07/16/2018 12:17:58 PM   Radiology   Procedures Procedures (including critical care time)  Medications Ordered in ED Medications  sodium chloride 0.9 % bolus 1,000 mL (0 mLs Intravenous Stopped 07/16/18 1334)     Initial Impression / Assessment and Plan / ED Course  I have reviewed the triage vital signs and the nursing notes.  Pertinent labs & imaging results that were available during my care of the patient were reviewed by me and considered in my medical  decision making (see chart for details).  MDM Reviewed: previous chart, nursing note and vitals Reviewed previous: labs and ECG Interpretation: labs, ECG, x-ray and CT scan   Results for orders placed or performed during the hospital encounter of 07/16/18  Comprehensive metabolic panel  Result Value Ref Range   Sodium 138 135 - 145 mmol/L   Potassium 3.6 3.5 - 5.1 mmol/L   Chloride 103 98 - 111 mmol/L   CO2 28 22 - 32 mmol/L   Glucose, Bld 93 70 - 99 mg/dL   BUN 5 (L) 8 - 23 mg/dL   Creatinine, Ser 0.61 0.44 - 1.00 mg/dL   Calcium 8.5 (L) 8.9 - 10.3 mg/dL   Total Protein 6.4 (L) 6.5 - 8.1 g/dL   Albumin 3.4 (L) 3.5 - 5.0 g/dL   AST 20 15 - 41 U/L   ALT 10 0 - 44 U/L   Alkaline Phosphatase  70 38 - 126 U/L   Total Bilirubin 0.3 0.3 - 1.2 mg/dL   GFR calc non Af Amer >60 >60 mL/min   GFR calc Af Amer >60 >60 mL/min   Anion gap 7 5 - 15  Troponin I  Result Value Ref Range   Troponin I <0.03 <0.03 ng/mL  CBC with Differential  Result Value Ref Range   WBC 7.6 4.0 - 10.5 K/uL   RBC 3.80 (L) 3.87 - 5.11 MIL/uL   Hemoglobin 11.0 (L) 12.0 - 15.0 g/dL   HCT 34.3 (L) 36.0 - 46.0 %   MCV 90.3 78.0 - 100.0 fL   MCH 28.9 26.0 - 34.0 pg   MCHC 32.1 30.0 - 36.0 g/dL   RDW 15.2 11.5 - 15.5 %   Platelets 230 150 - 400 K/uL   Neutrophils Relative % 69 %   Neutro Abs 5.2 1.7 - 7.7 K/uL   Lymphocytes Relative 25 %   Lymphs Abs 1.9 0.7 - 4.0 K/uL   Monocytes Relative 4 %   Monocytes Absolute 0.3 0.1 - 1.0 K/uL   Eosinophils Relative 2 %   Eosinophils Absolute 0.2 0.0 - 0.7 K/uL   Basophils Relative 0 %   Basophils Absolute 0.0 0.0 - 0.1 K/uL  Protime-INR  Result Value Ref Range   Prothrombin Time 21.5 (H) 11.4 - 15.2 seconds   INR 1.88   Urinalysis, Routine w reflex microscopic  Result Value Ref Range   Color, Urine YELLOW YELLOW   APPearance HAZY (A) CLEAR   Specific Gravity, Urine 1.002 (L) 1.005 - 1.030   pH 7.0 5.0 - 8.0   Glucose, UA NEGATIVE NEGATIVE mg/dL   Hgb urine dipstick SMALL (A) NEGATIVE   Bilirubin Urine NEGATIVE NEGATIVE   Ketones, ur NEGATIVE NEGATIVE mg/dL   Protein, ur NEGATIVE NEGATIVE mg/dL   Nitrite NEGATIVE NEGATIVE   Leukocytes, UA MODERATE (A) NEGATIVE   RBC / HPF 6-10 0 - 5 RBC/hpf   WBC, UA 21-50 0 - 5 WBC/hpf   Bacteria, UA RARE (A) NONE SEEN   Squamous Epithelial / LPF 11-20 0 - 5   Budding Yeast PRESENT   Urine rapid drug screen (hosp performed)  Result Value Ref Range   Opiates NONE DETECTED NONE DETECTED   Cocaine NONE DETECTED NONE DETECTED   Benzodiazepines POSITIVE (A) NONE DETECTED   Amphetamines NONE DETECTED NONE DETECTED   Tetrahydrocannabinol POSITIVE (A) NONE DETECTED   Barbiturates NONE DETECTED NONE DETECTED  Sample to  Blood Bank  Result Value Ref Range   Blood Bank Specimen SAMPLE AVAILABLE FOR TESTING  Sample Expiration      07/17/2018 Performed at [facility], [address], [place], [place] [address]    Dg Chest 2 View Result Date: 07/16/2018 CLINICAL DATA:  Productive cough. EXAM: CHEST - 2 VIEW COMPARISON:  05/04/2018. FINDINGS: Stable changes from prior cardiac surgery and mitral valve replacement. Cardiac silhouette is normal in size. No mediastinal or hilar masses. No evidence of adenopathy. Prominent bronchovascular markings similar to the prior study. Lungs are hyperexpanded. No evidence of pneumonia or pulmonary edema. No pleural effusion or pneumothorax. Skeletal structures are demineralized but intact. IMPRESSION: No acute cardiopulmonary disease. Electronically Signed   By: Lajean Manes M.D.   On: 07/16/2018 12:28    1240:  [facility] states they observed pt taking a pill from her purse, stated "it's my nerve pill" and that "the nurse knows about it." Pt told ED RN it was a Klonopin she "found in her purse." [place] PMP Database accessed: no benzo rx filled in [place] or [place] for the past 2 years. Pt queried regarding this information. appears groggy on my re-eval, states she "just had one pill" in her purse. Pt informed regarding the risks/dangers of taking meds not rx to her. Pt verb understanding. IVF bolus ordered for orthostatic hypotension.   1600:  Pt now awake/alert, wants to leave. States she feels better. VS improved. Pt ambulated with steady gait, easy resps, NAD. H/H per baseline. Udip appears contaminated and pt denies dysuria; UC pending.  No clear indication for admission at this time. Pt will need f/u with PMD and GI MD.  Dx and testing d/w pt and family.  Questions answered.  Verb understanding, agreeable to d/c home with outpt f/u.       Final Clinical Impressions(s) / ED Diagnoses   Final diagnoses:  Lightheadedness  Generalized weakness  Benzodiazepine abuse ([place])  Marijuana use   Black stools    ED Discharge Orders    None       Francine Graven, DO 07/18/18 0900

## 2018-07-16 NOTE — ED Notes (Signed)
Tracie from [facility] states she observed the patient taking a pill from her purse stating, "its my nerve pill." Patient also stated that the nurse knows about it and it'll be ok.

## 2018-07-17 LAB — URINE CULTURE

## 2018-07-22 ENCOUNTER — Encounter: Payer: Self-pay | Admitting: *Deleted

## 2018-07-22 ENCOUNTER — Ambulatory Visit (INDEPENDENT_AMBULATORY_CARE_PROVIDER_SITE_OTHER): Payer: Medicaid Other | Admitting: Cardiovascular Disease

## 2018-07-22 ENCOUNTER — Encounter: Payer: Self-pay | Admitting: Gastroenterology

## 2018-07-22 VITALS — BP 146/100 | HR 86 | Ht 62.0 in | Wt 117.6 lb

## 2018-07-22 DIAGNOSIS — Z716 Tobacco abuse counseling: Secondary | ICD-10-CM | POA: Diagnosis not present

## 2018-07-22 DIAGNOSIS — I2510 Atherosclerotic heart disease of native coronary artery without angina pectoris: Secondary | ICD-10-CM

## 2018-07-22 DIAGNOSIS — Z86018 Personal history of other benign neoplasm: Secondary | ICD-10-CM | POA: Diagnosis not present

## 2018-07-22 DIAGNOSIS — Z9889 Other specified postprocedural states: Secondary | ICD-10-CM

## 2018-07-22 DIAGNOSIS — R030 Elevated blood-pressure reading, without diagnosis of hypertension: Secondary | ICD-10-CM

## 2018-07-22 MED ORDER — ASPIRIN EC 81 MG PO TBEC: 81.0000 mg | DELAYED_RELEASE_TABLET | Freq: Every day | ORAL | 3 refills | Status: DC

## 2018-07-22 NOTE — Patient Instructions (Addendum)
Your physician wants you to follow-up in: 6 months  with Dr. Virgina Jock will receive a reminder letter in the mail two months in advance. If you don't receive a letter, please call our office to schedule the follow-up appointment.   STOP Coumadin    START Aspirin 81 mg daily    ALL other medications stay the same    If you need a refill on your cardiac medications before your next appointment, please call your pharmacy.      No labs or tests today   Thank you for choosing [facility] !

## 2018-07-22 NOTE — Progress Notes (Signed)
SUBJECTIVE: The patient is a 67 year old woman who I am meeting for the first time.  She was hospitalized in April 2019 at [facility] for chest pain and shortness of breath and found to be in acute heart failure.  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  Cardiac catheterization demonstrated no significant coronary artery disease with a proximal 40% LAD stenosis.  She was evaluated by cardiothoracic surgery and underwent resection for atrial myxoma with atrial septal patch and mitral valve repair on 04/01/2018.  She was placed on warfarin for antithrombotic therapy with a plan to remain on it for 3 months with a goal INR of 2.5 followed by aspirin.  She was recently evaluated in the ED on 07/16/2018 for dizziness and did appear to be due to Klonopin.  She is doing well overall.  She has had some episodes of dizziness over the past 3 weeks.  She denies chest pain and palpitations.  She very seldom has shortness of breath.  She takes ferrous sulfate.  Of note, urine toxicology screen was positive for benzodiazepines and THC's in the ED on 7/26.  INR was 1.88.  Hemoglobin was 11.  She drank extra caffeinated coffee last night and said she feels anxious this morning.  I reviewed the echocardiogram performed on 05/20/2018 which demonstrated normal left ventricular systolic function, LVEF 60 to 65%, mild LVH, mild left atrial enlargement, well-functioning mitral valve repair, and mildly reduced right ventricular systolic function.      Review of Systems: As per "subjective", otherwise negative.  Allergies  Allergen Reactions  . Aspirin Other (See Comments)    Has had part of stomach removed, cannot take aspirin   . Ketorolac Tromethamine Other (See Comments)    NSAID > NOT TO TAKE ASA NOR NSAIDS DUE TO GASTRECTOMY] "makes my hands draw up"  . Nsaids Other (See Comments)    Due to stomach issues (has had part of stomach removed, cannot take NSAIDS  OR ASA)  . Sulfonamide Derivatives Nausea And Vomiting and Other (See Comments)    SYNCOPE > LOSS CONSCIOUSNESS   . Imitrex [Sumatriptan Base] Palpitations    Fast heart rate  . Promethazine Hcl Other (See Comments)    Legs jerking -" like restless legs"    Current Outpatient Medications  Medication Sig Dispense Refill  . acetaminophen (TYLENOL) 500 MG tablet Take 500-1,000 mg by mouth every 6 (six) hours as needed (for fever.).    Marland Kitchen atorvastatin (LIPITOR) 40 MG tablet Take 1 tablet (40 mg total) by mouth daily at 6 PM. 30 tablet 6  . cetirizine (ZYRTEC) 10 MG tablet Take 10 mg by mouth daily.    . cyclobenzaprine (FLEXERIL) 10 MG tablet Take 10 mg by mouth 3 (three) times daily.   0  . diphenhydrAMINE (BENADRYL) 25 mg capsule Take 25 mg by mouth every 6 (six) hours as needed (for allergies.).    Marland Kitchen FLUoxetine (PROZAC) 40 MG capsule Take 40 mg by mouth daily.   0  . gabapentin (NEURONTIN) 300 MG capsule Take 2 capsules (600 mg total) by mouth 3 (three) times daily. 180 capsule 0  . omeprazole (PRILOSEC) 40 MG capsule Take 1 capsule by mouth daily.  0  . ondansetron (ZOFRAN) 4 MG tablet TAKE 1 TABLET(4 MG) BY MOUTH EVERY 8 HOURS AS NEEDED FOR NAUSEA OR VOMITING 30 tablet 0  . SUBOXONE 8-2 MG FILM Place 1 tablet under the tongue 3 (three) times daily as needed for  pain.  0  . warfarin (COUMADIN) 2.5 MG tablet Take 1 1/2 tablets daily except 1 tablet on Sundays or as directed 45 tablet 3   No current facility-administered medications for this visit.     Past Medical History:  Diagnosis Date  . Anxiety   . Atrial myxoma    Left atrial  . CAD (coronary artery disease) 04/21/2018   LHC 4/19: no sig CAD, pLAD 40  . Chronic back pain   . Drug-seeking behavior   . Gastric nodule 2009   EUS, ?leiomyoma  . 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 GERD (gastroesophageal reflux disease)   . Gout   .  History of pancreatitis    Biliary and/or etoh?  . Irritable bowel syndrome   . Knee pain   . Migraines   . Migraines   . Peripheral neuropathy   . Polysubstance abuse (HCC)    opiates, cocaine, marijuana  . Status post mitral valve repair    Echo 5/19: Mild LVH, EF 60-65, normal wall motion, status post mitral valve repair (mean gradient 4), mild LAE, mildly reduced RVSF    Past Surgical History:  Procedure Laterality Date  . ABDOMINAL HYSTERECTOMY    . ABDOMINAL SURGERY    . BACK SURGERY    . back surgery /ray cage fusion comberg, gso    . BIOPSY  07/31/2015   Procedure: BIOPSY (DUODENAL, GASTRIC, GASTRIC ULCER, ESOPHAGEAL);  Surgeon: Danie Binder, MD;  Location: [facility];  Service: Endoscopy;;  . CESAREAN SECTION    . CHOLECYSTECTOMY    . COLONOSCOPY  2001   Dr. Laural Golden, hemorrhoids  . COLONOSCOPY  10/21/2011   HER:DEYCXKG polyp multiple/internal hemorrhoids  . DILATION AND CURETTAGE OF UTERUS    . ESOPHAGEAL DILATION N/A 07/31/2015   Procedure: ESOPHAGEAL DILATION WIRE GUIDED WITH SAVORY DILATORS 15MM, 16MM;  Surgeon: Danie Binder, MD;  Location: [facility];  Service: Endoscopy;  Laterality: N/A;  . ESOPHAGOGASTRODUODENOSCOPY  11/2008   A 9-mm submucosal lesion seen in the cardia., gastritis, no hpylori  . ESOPHAGOGASTRODUODENOSCOPY (EGD) WITH PROPOFOL N/A 07/31/2015   YJE:HUDJSHF ulcer and moderate gastritis, dysphagia, empirical dilation with no identified source. esophageal, gastric, duodenal bx unremarkable.   . ESOPHAGOGASTRODUODENOSCOPY (EGD) WITH PROPOFOL N/A 11/13/2015   SLF: 1. Gastric ulcer has healed. 2. single non-bleeding gastric AVMs 3. mild non-erosive gastritis.   Marland Kitchen ESOPHAGOGASTRODUODENOSCOPY (EGD) WITH PROPOFOL N/A 10/20/2017   Procedure: ESOPHAGOGASTRODUODENOSCOPY (EGD) WITH PROPOFOL;  Surgeon: Danie Binder, MD;  Location: [facility];  Service: Endoscopy;  Laterality: N/A;  2:00pm  . EXCISION OF ATRIAL MYXOMA N/A 04/01/2018   Procedure: RESECTION OF LEFT  ATRIAL MYXOMA;  Surgeon: Melrose Nakayama, MD;  Location: [place];  Service: Open Heart Surgery;  Laterality: N/A;  . FOOT SURGERY    . GALLBLADDER SURGERY  2010  . LEFT HEART CATH AND CORONARY ANGIOGRAPHY N/A 03/29/2018   Procedure: LEFT HEART CATH AND CORONARY ANGIOGRAPHY;  Surgeon: Lorretta Harp, MD;  Location: [place] [facility];  Service: Cardiovascular;  Laterality: N/A;  . MITRAL VALVE REPAIR N/A 04/01/2018   Procedure: MITRAL VALVE REPAIR.;  Surgeon: Melrose Nakayama, MD;  Location: [place];  Service: Open Heart Surgery;  Laterality: N/A;  Mitral valve annuloplasty   . 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  Marland Kitchen SAVORY DILATION  02/03/2012   YSA:[id number] in the distal esophagus/Mild gastritis  . TEE WITHOUT CARDIOVERSION N/A 04/01/2018   Procedure: TRANSESOPHAGEAL ECHOCARDIOGRAM (TEE);  Surgeon: Melrose Nakayama, MD;  Location: [place];  Service: Open Heart Surgery;  Laterality: N/A;  . toe graft    . TONSILLECTOMY      Social History   Socioeconomic History  . Marital status: Divorced    Spouse name: Not on file  . Number of children: 2  . Years of education: 10th grade  . Highest education level: Not on file  Occupational History  . Occupation: disabled: back problems   Social Needs  . Financial resource strain: Not on file  . Food insecurity:    Worry: Not on file    Inability: Not on file  . Transportation needs:    Medical: Not on file    Non-medical: Not on file  Tobacco Use  . Smoking status: Current Every Day Smoker    Packs/day: 0.50    Years: 35.00    Pack years: 17.50    Types: Cigarettes  . Smokeless tobacco: Never Used  Substance and Sexual Activity  . Alcohol use: Yes    Comment: occasional holiday drink... pt denied alcohol use on admission to [facility]  . Drug use: Yes    Frequency: 1.0  times per week    Types: Marijuana    Comment: +cocaine on 04/2015 UDS  . Sexual activity: Not on file  Lifestyle  . Physical activity:    Days per week: Not on file    Minutes per session: Not on file  . Stress: Not on file  Relationships  . Social connections:    Talks on phone: Not on file    Gets together: Not on file    Attends religious service: Not on file    Active member of club or organization: Not on file    Attends meetings of clubs or organizations: Not on file    Relationship status: Not on file  . Intimate partner violence:    Fear of current or ex partner: Not on file    Emotionally abused: Not on file    Physically abused: Not on file    Forced sexual activity: Not on file  Other Topics Concern  . Not on file  Social History Narrative  . Not on file     Vitals:   07/22/18 0807  BP: (!) 146/100  Pulse: 86  SpO2: 96%  Weight: 117 lb 9.6 oz (53.3 kg)  Height: 5\' 2"  (1.575 m)    Wt Readings from Last 3 Encounters:  07/22/18 117 lb 9.6 oz (53.3 kg)  07/16/18 118 lb (53.5 kg)  05/04/18 118 lb 6.4 oz (53.7 kg)     PHYSICAL EXAM General: NAD HEENT: Normal. Neck: No JVD, no thyromegaly. Lungs: Clear to auscultation bilaterally with normal respiratory effort. CV: Regular rate and rhythm, normal S1/S2, no S3/S4, no murmur. No pretibial or periankle edema.  No carotid bruit.   Abdomen: Soft, nontender, no distention.  Neurologic: Alert and oriented.  Psych: Normal affect. Skin: Normal. Musculoskeletal: No gross deformities.    ECG: Reviewed above under Subjective   Labs: Lab Results  Component Value Date/Time   K 3.6 07/16/2018 11:25 AM   BUN 5 (L) 07/16/2018 11:25 AM   CREATININE 0.61 07/16/2018 11:25 AM   CREATININE 0.74 07/16/2015 12:49 PM   ALT 10 07/16/2018 11:25  AM   TSH 1.694 03/27/2018 07:42 AM   TSH 0.640 04/24/2015 07:35 PM   HGB 11.0 (L) 07/16/2018 11:25 AM     Lipids: Lab Results  Component Value Date/Time   LDLCALC 133 (H)  03/31/2018 02:33 AM   CHOL 204 (H) 03/31/2018 02:33 AM   TRIG 55 03/31/2018 02:33 AM   HDL 60 03/31/2018 02:33 AM       ASSESSMENT AND PLAN:  1.  History of atrial myxoma: Status post resection.  2.  Status post mitral valve repair: Echocardiogram demonstrated durable valve repair and normal left ventricular systolic function.  Plan is to remain on warfarin for 3 months followed by aspirin.  As it has been over 3 months since surgery, I will discontinue warfarin and start aspirin 81 mg daily.  She will need SBE prophylaxis with dental work.  She does have a prior history of GI bleeding prior to partial gastrectomy and is on proton pump inhibitor therapy.  3.  Coronary artery disease: Mild nonobstructive disease in the proximal LAD as noted above.  She is on Lipitor.  I will discontinue warfarin and start aspirin.  4.  Tobacco abuse: 1 pack of cigarettes lasts for days.  I counseled her to quit (3 minutes).  5.  Elevated blood pressure: Blood pressure is elevated today with fluctuating blood pressure readings in the ED on 07/16/2018.  This will need further monitoring.   Disposition: Follow up 6 months   Kate Sable, M.D., F.A.C.C.

## 2018-08-18 ENCOUNTER — Encounter

## 2018-08-18 ENCOUNTER — Encounter: Payer: Self-pay | Admitting: Nurse Practitioner

## 2018-08-18 ENCOUNTER — Ambulatory Visit (INDEPENDENT_AMBULATORY_CARE_PROVIDER_SITE_OTHER): Payer: Medicaid Other | Admitting: Nurse Practitioner

## 2018-08-18 ENCOUNTER — Ambulatory Visit: Payer: Medicaid Other | Admitting: Nurse Practitioner

## 2018-08-18 VITALS — BP 133/89 | HR 98 | Temp 99.4°F | Ht 62.0 in | Wt 118.2 lb

## 2018-08-18 DIAGNOSIS — R195 Other fecal abnormalities: Secondary | ICD-10-CM

## 2018-08-18 DIAGNOSIS — K219 Gastro-esophageal reflux disease without esophagitis: Secondary | ICD-10-CM

## 2018-08-18 DIAGNOSIS — K5900 Constipation, unspecified: Secondary | ICD-10-CM | POA: Diagnosis not present

## 2018-08-18 MED ORDER — PANTOPRAZOLE SODIUM 40 MG PO TBEC: 40.0000 mg | DELAYED_RELEASE_TABLET | Freq: Two times a day (BID) | ORAL | 1 refills | Status: DC

## 2018-08-18 NOTE — Assessment & Plan Note (Signed)
The patient complained of dark stools and her primary care provider checked a fecal occult blood test which was positive.  At that time she was on Coumadin status post open heart surgery for atrial mass excision and mitral valve repair.  Her hemoglobin in the emergency department, ironically, was better than it has been recently and found to be 11.  No overt anemia symptoms.  She is now off of Coumadin and her stools are no longer dark.  Her last CBC was a month ago.  I will recheck CBC, iron, ferritin.  Recommend she continue to avoid NSAIDs.  She is to call us if her stools become dark again at which point we could likely check a FOBT.  Return for follow-up in 3 months.

## 2018-08-18 NOTE — Patient Instructions (Signed)
1. I have refilled Linzess 145 mcg for you.  Take this once a day, on an empty stomach. 2. Stop taking Prilosec for now.  Start taking Protonix 40 mg twice daily on an empty stomach. 3. Have your labs drawn when you are able to. 4. Avoid all NSAIDs (ibuprofen, Motrin, Advil, Aleve, naproxen, Naprosyn, anything with "NSAID" on the bottle).  Call if your stools become dark again. 5. Return for follow-up in 3 months. 6. Call us if you have any questions or concerns.  At [facility] we value your feedback. You may receive a survey about your visit today. Please share your experience as we strive to create trusting relationships with our patients to provide genuine, compassionate, quality care.  We appreciate your understanding and patience as we review any laboratory studies, imaging, and other diagnostic tests that are ordered as we care for you. Our office policy is 5 business days for review of these results, and any emergent or urgent results are addressed in a timely manner for your best interest. If you do not hear from our office in 1 week, please contact us.   We also encourage the use of MyChart, which contains your medical information for your review as well. If you are not enrolled in this feature, an access code is on this after visit summary for your convenience. Thank you for allowing [place] to be involved in your care.  It was great to see you today!  I hope you have a great summer!!

## 2018-08-18 NOTE — Assessment & Plan Note (Signed)
The patient has noted some recurrent constipation.  Linzess previously worked well for her and she is requesting a refill.  I will send this prescription to her pharmacy for Linzess 145 mcg once a day.  Return for follow-up in 3 months.

## 2018-08-18 NOTE — Assessment & Plan Note (Signed)
The patient now describes belching and "feeling like I am throwing up in the back of my throat" which is intermittent.  This seems like recurrent GERD symptoms.  She is currently on Prilosec once a day.  She does have a history of peptic ulcer disease.  EGD was recently completed 10/20/2017 which found mild gastritis.  Given her heme positive stool, as per below, I feel it is best to change her to Protonix 40 mg twice a day to ensure we are controlling any gastritis symptoms and prevent bleeding.  I also reinforced to avoid all NSAIDs.  Her stools are no longer dark and she will call us if they recur.  Return for follow-up in 3 months.

## 2018-08-18 NOTE — Progress Notes (Signed)
Referring Provider: Barry Dienes, NP Primary Care Physician:  Barry Dienes, NP Primary GI:  Dr. Oneida Alar  Chief Complaint  Patient presents with  . Blood In Stools    HPI:   Jacqueline [name] is a 67 y.o. female who presents for evaluation of rectal bleeding.  Patient was last seen in our office 10/06/2017 for GERD, nausea and vomiting, constipation.  Known history of IBS and persistent nausea.  Typically is predominantly constipated but occasional diarrhea.  Chronic weight loss subjectively without none amount but stable weight objectively.  Last EGD date 11/13/2015 with a gastric ulcer status post healing, single nonbleeding AVM, mild nonerosive gastritis with a recommended course of Protonix twice daily and follow-up with [facility] for dysphasia/abdominal pain.  Colonoscopy up-to-date 10/21/2011 with recommended repeat exam in 10 years (2022) on propofol and was pediatric colonoscope.  At her last visit she noted her nausea and GERD worsened.  GERD symptoms for the previous couple weeks regardless of specific intake, still on Prilosec twice daily and needing a refill.  Occasional vomiting 1-2 times a week.  Bowel movement every other day, emptying completely.  Overall constipation is improved.  Takes ibuprofen as needed on average twice a week.  Epigastric abdominal pain and lower abdominal pain.  Notes subjective weight loss of unknown amount although her weight is within 4 to 5 pounds over the past year.  Noted some dyspnea with exertion recently but has not seen anybody about it but plans to make an appointment with primary care.  No other GI symptoms.  Recommended upper endoscopy, start Dexilant instead of Prilosec, call with a progress report, Zofran for nausea, stop/avoid all NSAIDs, follow-up in 3 months.  Progress report was called with the patient called stating she was out of samples.  A prescription was sent after prior Auth was approved.  EGD completed 10/20/2017 which found web in the  distal esophagus status post dilation, mild gastritis due to NSAIDs, nausea and vomiting with abdominal pain most likely due to uncontrolled GERD/gastritis.  Recommended minimize/avoid NSAIDs and reflux triggers.  Follow-up in 3 months.  She was a no-show to her follow-up visit.  She recently had open heart for excision of a large atrial myxoma and mitral valve repair in April of this year. She was on Coumadin for 3 months (completed about a month ago).  She was in the emergency department for lightheadedness which is deemed likely due to Klonopin.  Her UDS was positive for THC and benzodiazepines.  Labs in the ER found normal LFTs, normal creatinine.  Mild anemia with a hemoglobin of 11.0, normal platelets.  Her anemia is normocytic and normochromic.  In the emergency department she was observed taking a pill from her purse and was subsequently groggy and stated she took 1 of her nerve pills.  H&H was deemed to be at baseline.  Likely benzodiazepine/Klonopin effect.  He does have a history of intermittent anemia as low as hemoglobin of 8.0.  Today she states she's ok overall. She was having dark stools and this was checked by her PCP and found to be heme+. She is now on daily ASA 81 mg. This was found 3 weeks ago, stopped Coumadin 2 weeks ago. She states her stools aren't black/dark anymore. No worsening abdominal pain. Nausea is persistent. She is on Prilosec once daily. Occasional belching feeling like "I'm throwing up in my mouth." Denies hematochezia, unintentional weight loss. Thinks she had "a bug" a few weeks ago with a low grade temp  100.0; no persistent fevers. Needs a refill of Linzess (which she states worked well for her) because she is having recurrence of her constipation. Denies NSAIDs and ASA powders (other than ASA 81 mg daily).  Past Medical History:  Diagnosis Date  . Anxiety   . Atrial myxoma    Left atrial  . CAD (coronary artery disease) 04/21/2018   LHC 4/19: no sig CAD, pLAD 40   . Chronic back pain   . Drug-seeking behavior   . Gastric nodule 2009   EUS, ?leiomyoma  . 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 GERD (gastroesophageal reflux disease)   . Gout   . History of pancreatitis    Biliary and/or etoh?  . Irritable bowel syndrome   . Knee pain   . Migraines   . Migraines   . Peripheral neuropathy   . Polysubstance abuse (HCC)    opiates, cocaine, marijuana  . Status post mitral valve repair    Echo 5/19: Mild LVH, EF 60-65, normal wall motion, status post mitral valve repair (mean gradient 4), mild LAE, mildly reduced RVSF    Past Surgical History:  Procedure Laterality Date  . ABDOMINAL HYSTERECTOMY    . ABDOMINAL SURGERY    . BACK SURGERY    . back surgery /ray cage fusion comberg, gso    . BIOPSY  07/31/2015   Procedure: BIOPSY (DUODENAL, GASTRIC, GASTRIC ULCER, ESOPHAGEAL);  Surgeon: Danie Binder, MD;  Location: [facility];  Service: Endoscopy;;  . CESAREAN SECTION    . CHOLECYSTECTOMY    . COLONOSCOPY  2001   Dr. Laural Golden, hemorrhoids  . COLONOSCOPY  10/21/2011   OVF:IEPPIRJ polyp multiple/internal hemorrhoids  . DILATION AND CURETTAGE OF UTERUS    . ESOPHAGEAL DILATION N/A 07/31/2015   Procedure: ESOPHAGEAL DILATION WIRE GUIDED WITH SAVORY DILATORS 15MM, 16MM;  Surgeon: Danie Binder, MD;  Location: [facility];  Service: Endoscopy;  Laterality: N/A;  . ESOPHAGOGASTRODUODENOSCOPY  11/2008   A 9-mm submucosal lesion seen in the cardia., gastritis, no hpylori  . ESOPHAGOGASTRODUODENOSCOPY (EGD) WITH PROPOFOL N/A 07/31/2015   JOA:CZYSAYT ulcer and moderate gastritis, dysphagia, empirical dilation with no identified source. esophageal, gastric, duodenal bx unremarkable.   . ESOPHAGOGASTRODUODENOSCOPY (EGD) WITH PROPOFOL N/A 11/13/2015   SLF: 1. Gastric ulcer has healed. 2. single non-bleeding gastric AVMs 3. mild non-erosive gastritis.   Marland Kitchen ESOPHAGOGASTRODUODENOSCOPY  (EGD) WITH PROPOFOL N/A 10/20/2017   Procedure: ESOPHAGOGASTRODUODENOSCOPY (EGD) WITH PROPOFOL;  Surgeon: Danie Binder, MD;  Location: [facility];  Service: Endoscopy;  Laterality: N/A;  2:00pm  . EXCISION OF ATRIAL MYXOMA N/A 04/01/2018   Procedure: RESECTION OF LEFT ATRIAL MYXOMA;  Surgeon: Melrose Nakayama, MD;  Location: [facility];  Service: Open Heart Surgery;  Laterality: N/A;  . FOOT SURGERY    . GALLBLADDER SURGERY  2010  . LEFT HEART CATH AND CORONARY ANGIOGRAPHY N/A 03/29/2018   Procedure: LEFT HEART CATH AND CORONARY ANGIOGRAPHY;  Surgeon: Lorretta Harp, MD;  Location: [place];  Service: Cardiovascular;  Laterality: N/A;  . MITRAL VALVE REPAIR N/A 04/01/2018   Procedure: MITRAL VALVE REPAIR.;  Surgeon: Melrose Nakayama, MD;  Location: [place];  Service: Open Heart Surgery;  Laterality: N/A;  Mitral valve annuloplasty   . 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  Marland Kitchen SAVORY DILATION  02/03/2012   DJT:[id number] in the distal esophagus/Mild gastritis  . TEE WITHOUT CARDIOVERSION N/A 04/01/2018   Procedure: TRANSESOPHAGEAL ECHOCARDIOGRAM (TEE);  Surgeon: Melrose Nakayama, MD;  Location: [facility];  Service: Open Heart Surgery;  Laterality: N/A;  . toe graft    . TONSILLECTOMY      Current Outpatient Medications  Medication Sig Dispense Refill  . acetaminophen (TYLENOL) 500 MG tablet Take 500-1,000 mg by mouth every 6 (six) hours as needed (for fever.).    Marland Kitchen aspirin EC 81 MG tablet Take 1 tablet (81 mg total) by mouth daily. 90 tablet 3  . atorvastatin (LIPITOR) 40 MG tablet Take 1 tablet (40 mg total) by mouth daily at 6 PM. 30 tablet 6  . cetirizine (ZYRTEC) 10 MG tablet Take 10 mg by mouth as needed.     . cyclobenzaprine (FLEXERIL) 10 MG tablet Take 10 mg by mouth 3 (three) times daily.   0  .  diphenhydrAMINE (BENADRYL) 25 mg capsule Take 25 mg by mouth every 6 (six) hours as needed (for allergies.).    Marland Kitchen FLUoxetine (PROZAC) 40 MG capsule Take 40 mg by mouth daily.   0  . gabapentin (NEURONTIN) 300 MG capsule Take 2 capsules (600 mg total) by mouth 3 (three) times daily. 180 capsule 0  . omeprazole (PRILOSEC) 40 MG capsule Take 1 capsule by mouth daily.  0  . ondansetron (ZOFRAN) 4 MG tablet TAKE 1 TABLET(4 MG) BY MOUTH EVERY 8 HOURS AS NEEDED FOR NAUSEA OR VOMITING 30 tablet 0  . SUBOXONE 8-2 MG FILM Place 1 tablet under the tongue 3 (three) times daily as needed for pain.  0   No current facility-administered medications for this visit.     Allergies as of 08/18/2018 - Review Complete 08/18/2018  Allergen Reaction Noted  . Aspirin Other (See Comments)   . Ketorolac tromethamine Other (See Comments) 09/30/2011  . Nsaids Other (See Comments) 12/18/2011  . Sulfonamide derivatives Nausea And Vomiting and Other (See Comments)   . Imitrex [sumatriptan base] Palpitations 09/30/2011  . Promethazine hcl Other (See Comments) 12/18/2011    Family History  Problem Relation Age of Onset  . Colon cancer Other   . Colon cancer Mother        diagnosed in late 38s and died age 32  . Heart failure Mother   . Heart defect Unknown        family history   . Arthritis Unknown        family history  . COPD Unknown        family history   . Cancer Unknown        multiple unknown type  . Heart attack Father        deceased at 24  . Hypertension Father   . Heart failure Father   . Lung cancer Maternal Uncle   . Throat cancer Maternal Uncle   . Colon cancer Paternal Uncle   . Colon cancer Maternal Aunt   . Anesthesia problems Neg Hx   . Hypotension Neg Hx   . Malignant hyperthermia Neg Hx   . Pseudochol deficiency Neg Hx     Social History   Socioeconomic History  . Marital status: Divorced    Spouse name: Not on file  . Number of children: 2  . Years of education: 10th grade    . Highest education level: Not on file  Occupational History  .  Occupation: disabled: back problems   Social Needs  . Financial resource strain: Not on file  . Food insecurity:    Worry: Not on file    Inability: Not on file  . Transportation needs:    Medical: Not on file    Non-medical: Not on file  Tobacco Use  . Smoking status: Current Every Day Smoker    Packs/day: 0.30    Years: 35.00    Pack years: 10.50    Types: Cigarettes  . Smokeless tobacco: Never Used  Substance and Sexual Activity  . Alcohol use: Yes    Comment: rare  . Drug use: Yes    Frequency: 1.0 times per week    Types: Marijuana    Comment: +cocaine on 04/2015 UDS; rare marijuana  . Sexual activity: Not on file  Lifestyle  . Physical activity:    Days per week: Not on file    Minutes per session: Not on file  . Stress: Not on file  Relationships  . Social connections:    Talks on phone: Not on file    Gets together: Not on file    Attends religious service: Not on file    Active member of club or organization: Not on file    Attends meetings of clubs or organizations: Not on file    Relationship status: Not on file  Other Topics Concern  . Not on file  Social History Narrative  . Not on file    Review of Systems: Complete ROS negative except as per HPI.   Physical Exam: BP 133/89   Pulse 98   Temp 99.4 F (37.4 C) (Oral)   Ht 5\' 2"  (1.575 m)   Wt 118 lb 3.2 oz (53.6 kg)   BMI 21.62 kg/m  General:   Alert and oriented. Pleasant and cooperative. Well-nourished and well-developed.  Eyes:  Without icterus, sclera clear and conjunctiva pink.  Ears:  Normal auditory acuity. Cardiovascular:  S1, S2 present without murmurs appreciated. Extremities without clubbing or edema. Respiratory:  Clear to auscultation bilaterally. No wheezes, rales, or rhonchi. No distress.  Gastrointestinal:  +BS, soft, and non-distended. Mild epigastric TTP noted. No HSM noted. No guarding or rebound. No masses  appreciated.  Rectal:  Deferred  Musculoskalatal:  Symmetrical without gross deformities. Skin:  Sternal incision looks well healed. Neurologic:  Alert and oriented x4;  grossly normal neurologically. Psych:  Alert and cooperative. Normal mood and affect. Heme/Lymph/Immune: No excessive bruising noted.    08/18/2018 3:40 PM   Disclaimer: This note was dictated with voice recognition software. Similar sounding words can inadvertently be transcribed and may not be corrected upon review.

## 2018-08-19 ENCOUNTER — Encounter: Payer: Self-pay | Admitting: Gastroenterology

## 2018-08-19 NOTE — Progress Notes (Signed)
CC'D TO PCP °

## 2018-09-03 ENCOUNTER — Emergency Department (HOSPITAL_COMMUNITY)
Admission: EM | Admit: 2018-09-03 | Discharge: 2018-09-03 | Disposition: A | Discharge status: Home / Self Care | Payer: Medicaid Other | Attending: Emergency Medicine | Admitting: Emergency Medicine

## 2018-09-03 ENCOUNTER — Other Ambulatory Visit: Payer: Self-pay

## 2018-09-03 ENCOUNTER — Encounter (HOSPITAL_COMMUNITY): Payer: Self-pay | Admitting: Emergency Medicine

## 2018-09-03 ENCOUNTER — Emergency Department (HOSPITAL_COMMUNITY): Payer: Medicaid Other

## 2018-09-03 DIAGNOSIS — I2510 Atherosclerotic heart disease of native coronary artery without angina pectoris: Secondary | ICD-10-CM | POA: Insufficient documentation

## 2018-09-03 DIAGNOSIS — Z7982 Long term (current) use of aspirin: Secondary | ICD-10-CM | POA: Diagnosis not present

## 2018-09-03 DIAGNOSIS — I10 Essential (primary) hypertension: Secondary | ICD-10-CM | POA: Insufficient documentation

## 2018-09-03 DIAGNOSIS — F17210 Nicotine dependence, cigarettes, uncomplicated: Secondary | ICD-10-CM | POA: Diagnosis not present

## 2018-09-03 DIAGNOSIS — R197 Diarrhea, unspecified: Secondary | ICD-10-CM | POA: Diagnosis not present

## 2018-09-03 DIAGNOSIS — E876 Hypokalemia: Secondary | ICD-10-CM | POA: Diagnosis not present

## 2018-09-03 DIAGNOSIS — J449 Chronic obstructive pulmonary disease, unspecified: Secondary | ICD-10-CM | POA: Diagnosis not present

## 2018-09-03 DIAGNOSIS — Z79899 Other long term (current) drug therapy: Secondary | ICD-10-CM | POA: Insufficient documentation

## 2018-09-03 LAB — COMPREHENSIVE METABOLIC PANEL
ALT: 12 U/L (ref 0–44)
AST: 26 U/L (ref 15–41)
Albumin: 3.5 g/dL (ref 3.5–5.0)
Alkaline Phosphatase: 78 U/L (ref 38–126)
Anion gap: 11 (ref 5–15)
BUN: 15 mg/dL (ref 8–23)
CO2: 32 mmol/L (ref 22–32)
Calcium: 8.6 mg/dL — ABNORMAL LOW (ref 8.9–10.3)
Chloride: 92 mmol/L — ABNORMAL LOW (ref 98–111)
Creatinine, Ser: 0.72 mg/dL (ref 0.44–1.00)
GFR calc Af Amer: >60 mL/min (ref >60)
GFR calc non Af Amer: >60 mL/min (ref >60)
Glucose, Bld: 111 mg/dL — ABNORMAL HIGH (ref 70–99)
Potassium: 2.8 mmol/L — ABNORMAL LOW (ref 3.5–5.1)
Sodium: 135 mmol/L (ref 135–145)
Total Bilirubin: 0.7 mg/dL (ref 0.3–1.2)
Total Protein: 7.2 g/dL (ref 6.5–8.1)

## 2018-09-03 LAB — CBC
HCT: 44.3 % (ref 36.0–46.0)
Hemoglobin: 15.1 g/dL — ABNORMAL HIGH (ref 12.0–15.0)
MCH: 29 pg (ref 26.0–34.0)
MCHC: 34.1 g/dL (ref 30.0–36.0)
MCV: 85 fL (ref 78.0–100.0)
Platelets: 290 10*3/uL (ref 150–400)
RBC: 5.21 MIL/uL — ABNORMAL HIGH (ref 3.87–5.11)
RDW: 14.5 % (ref 11.5–15.5)
WBC: 9.8 10*3/uL (ref 4.0–10.5)

## 2018-09-03 LAB — TROPONIN I: Troponin I: <0.03 ng/mL (ref <0.03)

## 2018-09-03 LAB — LIPASE, BLOOD: Lipase: 20 U/L (ref 11–51)

## 2018-09-03 MED ORDER — ONDANSETRON HCL 4 MG/2ML IJ SOLN
4.0000 mg | Freq: Once | INTRAMUSCULAR | Status: AC
  Administered 2018-09-03: 4 mg via INTRAVENOUS
  Filled 2018-09-03: qty 2

## 2018-09-03 MED ORDER — MORPHINE SULFATE (PF) 4 MG/ML IV SOLN
4.0000 mg | Freq: Once | INTRAVENOUS | Status: AC
  Administered 2018-09-03: 4 mg via INTRAVENOUS
  Filled 2018-09-03: qty 1

## 2018-09-03 MED ORDER — LACTATED RINGERS IV BOLUS
1000.0000 mL | Freq: Once | INTRAVENOUS | Status: AC
  Administered 2018-09-03: 1000 mL via INTRAVENOUS

## 2018-09-03 MED ORDER — ONDANSETRON 4 MG PO TBDP: 4.0000 mg | ORAL_TABLET | Freq: Three times a day (TID) | ORAL | 0 refills | Status: DC | PRN

## 2018-09-03 MED ORDER — DIPHENOXYLATE-ATROPINE 2.5-0.025 MG PO TABS: 1.0000 | ORAL_TABLET | Freq: Four times a day (QID) | ORAL | 0 refills | Status: DC | PRN

## 2018-09-03 MED ORDER — POTASSIUM CHLORIDE CRYS ER 20 MEQ PO TBCR
80.0000 meq | EXTENDED_RELEASE_TABLET | Freq: Once | ORAL | Status: AC
  Administered 2018-09-03: 80 meq via ORAL
  Filled 2018-09-03: qty 4

## 2018-09-03 NOTE — ED Triage Notes (Addendum)
Patient complaining of diarrhea x 1 week. States she feels weak and has no appetite for 2 weeks. Also complaining of lower abdominal pain. During triage, patient complaining of chest pain constant x 1 week.

## 2018-09-07 NOTE — ED Provider Notes (Signed)
[facility] EMERGENCY DEPARTMENT Provider Note   CSN: [id number] Arrival date & time: 09/03/18  0944     History   Chief Complaint Chief Complaint  Patient presents with  . Diarrhea  . Chest Pain    HPI Jacqueline [name] is a 66 y.o. female.  HPI   67 year old female with diarrhea.  Onset about a week ago.  She estimates 5-10 episodes per day.  Loose to watery.  No blood or melena.  No fevers or chills.  Anorexia.  No urinary complaints.  No sick contacts.  Denies any recent antibiotic usage.  Crampy abdominal pain which comes and goes without appreciable exacerbating relieving factors.  She has been taking Imodium with mild improvement.  Past Medical History:  Diagnosis Date  . Anxiety   . Atrial myxoma    Left atrial  . CAD (coronary artery disease) 04/21/2018   LHC 4/19: no sig CAD, pLAD 40  . Chronic back pain   . Drug-seeking behavior   . Gastric nodule 2009   EUS, ?leiomyoma  . 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 GERD (gastroesophageal reflux disease)   . Gout   . History of pancreatitis    Biliary and/or etoh?  . Irritable bowel syndrome   . Knee pain   . Migraines   . Migraines   . Peripheral neuropathy   . Polysubstance abuse (HCC)    opiates, cocaine, marijuana  . Status post mitral valve repair    Echo 5/19: Mild LVH, EF 60-65, normal wall motion, status post mitral valve repair (mean gradient 4), mild LAE, mildly reduced RVSF    Patient Active Problem List   Diagnosis Date Noted  . Heme + stool 08/18/2018  . Atrial myxoma   . CAD (coronary artery disease) 04/21/2018  . Encounter for therapeutic drug monitoring 04/12/2018  . S/P mitral valve repair 04/02/2018  . COPD with chronic bronchitis ([place]) 03/29/2018  . History of atrial myxoma   . Chronic pain syndrome 03/27/2018  . Tobacco abuse 03/27/2018  . HTN (hypertension) 03/27/2018  . Constipation  10/06/2017  . GERD (gastroesophageal reflux disease) 09/26/2016  . PUD (peptic ulcer disease) 10/01/2015  . Nausea with vomiting 10/01/2015  . Loss of weight 10/01/2015  . Dizzy 07/16/2015  . Major depressive disorder, recurrent, severe without psychotic features ([place])   . Substance induced mood disorder ([place]) 04/24/2015  . Opioid type dependence, continuous ([place]) 04/24/2015  . Severe major depression without psychotic features ([place]) 04/24/2015  . PTSD (post-traumatic stress disorder) 04/24/2015  . Chest pain, localized 04/28/2012  . Odynophagia 09/30/2011  . Colon cancer screening 09/30/2011  . Gastric tumor 09/30/2011  . CLOSED FRACTURE OF UPPER END OF FIBULA 07/31/2010  . Dysphagia, idiopathic 07/17/2009  . ABDOMINAL PAIN, GENERALIZED 07/17/2009  . GASTRITIS 07/12/2009  . IBS 07/12/2009  . RUQ PAIN 07/12/2009  . EPIGASTRIC PAIN 07/12/2009  . PANCREATITIS, ACUTE, HX OF 07/12/2009  . Osteoarthrosis, unspecified whether generalized or localized, hand 11/09/2008  . OSTEOARTHRITIS, LOWER LEG 10/25/2008  . DERANGEMENT MENISCUS 10/16/2008  . KNEE PAIN 10/16/2008    Past Surgical History:  Procedure Laterality Date  . ABDOMINAL HYSTERECTOMY    . ABDOMINAL SURGERY    . BACK SURGERY    . back surgery /ray cage fusion comberg, gso    . BIOPSY  07/31/2015   Procedure: BIOPSY (DUODENAL, GASTRIC, GASTRIC ULCER, ESOPHAGEAL);  Surgeon: Danie Binder, MD;  Location: [facility];  Service: Endoscopy;;  . CESAREAN SECTION    . CHOLECYSTECTOMY    . COLONOSCOPY  2001   Dr. Laural Golden, hemorrhoids  . COLONOSCOPY  10/21/2011   ZOX:WRUEAVW polyp multiple/internal hemorrhoids  . DILATION AND CURETTAGE OF UTERUS    . ESOPHAGEAL DILATION N/A 07/31/2015   Procedure: ESOPHAGEAL DILATION WIRE GUIDED WITH SAVORY DILATORS 15MM, 16MM;  Surgeon: Danie Binder, MD;  Location: [facility];  Service: Endoscopy;  Laterality: N/A;  . ESOPHAGOGASTRODUODENOSCOPY  11/2008   A 9-mm submucosal lesion seen in the cardia.,  gastritis, no hpylori  . ESOPHAGOGASTRODUODENOSCOPY (EGD) WITH PROPOFOL N/A 07/31/2015   UJW:[id number] ulcer and moderate gastritis, dysphagia, empirical dilation with no identified source. esophageal, gastric, duodenal bx unremarkable.   . ESOPHAGOGASTRODUODENOSCOPY (EGD) WITH PROPOFOL N/A 11/13/2015   SLF: 1. Gastric ulcer has healed. 2. single non-bleeding gastric AVMs 3. mild non-erosive gastritis.   Marland Kitchen ESOPHAGOGASTRODUODENOSCOPY (EGD) WITH PROPOFOL N/A 10/20/2017   Procedure: ESOPHAGOGASTRODUODENOSCOPY (EGD) WITH PROPOFOL;  Surgeon: Danie Binder, MD;  Location: [facility];  Service: Endoscopy;  Laterality: N/A;  2:00pm  . EXCISION OF ATRIAL MYXOMA N/A 04/01/2018   Procedure: RESECTION OF LEFT ATRIAL MYXOMA;  Surgeon: Melrose Nakayama, MD;  Location: [place];  Service: Open Heart Surgery;  Laterality: N/A;  . FOOT SURGERY    . GALLBLADDER SURGERY  2010  . LEFT HEART CATH AND CORONARY ANGIOGRAPHY N/A 03/29/2018   Procedure: LEFT HEART CATH AND CORONARY ANGIOGRAPHY;  Surgeon: Lorretta Harp, MD;  Location: [facility];  Service: Cardiovascular;  Laterality: N/A;  . MITRAL VALVE REPAIR N/A 04/01/2018   Procedure: MITRAL VALVE REPAIR.;  Surgeon: Melrose Nakayama, MD;  Location: [place];  Service: Open Heart Surgery;  Laterality: N/A;  Mitral valve annuloplasty   . 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  Marland Kitchen SAVORY DILATION  02/03/2012   [id number] in the distal esophagus/Mild gastritis  . TEE WITHOUT CARDIOVERSION N/A 04/01/2018   Procedure: TRANSESOPHAGEAL ECHOCARDIOGRAM (TEE);  Surgeon: Melrose Nakayama, MD;  Location: [facility] [facility];  Service: Open Heart Surgery;  Laterality: N/A;  . toe graft    . TONSILLECTOMY       OB History    Gravida  3   Para  2   Term  2   Preterm      AB  1   Living  2     SAB  1    TAB      Ectopic      Multiple      Live Births               Home Medications    Prior to Admission medications   Medication Sig Start Date End Date Taking? Authorizing Provider  acetaminophen (TYLENOL) 500 MG tablet Take 500-1,000 mg by mouth every 6 (six) hours as needed (for fever.).   Yes [provider]  aspirin EC 81 MG tablet Take 1 tablet (81 mg total) by mouth daily. 07/22/18  Yes Herminio Commons, MD  atorvastatin (LIPITOR) 40 MG tablet Take 1 tablet (40 mg total) by mouth daily at 6 PM. 06/07/18  Yes Herminio Commons, MD  cetirizine (ZYRTEC) 10 MG tablet Take 10 mg by mouth as needed.    Yes [provider]  cyclobenzaprine (FLEXERIL) 10 MG tablet  Take 10 mg by mouth 3 (three) times daily.  09/22/17  Yes [provider]  diphenhydrAMINE (BENADRYL) 25 mg capsule Take 25 mg by mouth every 6 (six) hours as needed (for allergies.).   Yes [provider]  FLUoxetine (PROZAC) 40 MG capsule Take 40 mg by mouth daily.  09/22/17  Yes [provider]  gabapentin (NEURONTIN) 300 MG capsule Take 2 capsules (600 mg total) by mouth 3 (three) times daily. 04/30/15  Yes Niel Hummer, NP  omeprazole (PRILOSEC) 40 MG capsule Take 1 capsule by mouth daily. 07/16/18  Yes [provider]  ondansetron (ZOFRAN) 4 MG tablet TAKE 1 TABLET(4 MG) BY MOUTH EVERY 8 HOURS AS NEEDED FOR NAUSEA OR VOMITING 11/11/17  Yes Annitta Needs, NP  pantoprazole (PROTONIX) 40 MG tablet Take 1 tablet (40 mg total) by mouth 2 (two) times daily before a meal. 08/18/18  Yes Gill, Eric A, NP  SUBOXONE 8-2 MG FILM Place 1 tablet under the tongue 3 (three) times daily as needed for pain. 04/20/18  Yes [provider]  diphenoxylate-atropine (LOMOTIL) 2.5-0.025 MG tablet Take 1 tablet by mouth 4 (four) times daily as needed for diarrhea or loose stools. 09/03/18   Virgel Manifold, MD  ondansetron (ZOFRAN ODT) 4 MG disintegrating tablet Take 1 tablet (4 mg total)  by mouth every 8 (eight) hours as needed for nausea or vomiting. 09/03/18   Virgel Manifold, MD    Family History Family History  Problem Relation Age of Onset  . Colon cancer Other   . Colon cancer Mother        diagnosed in late 67s and died age 44  . Heart failure Mother   . Heart defect Unknown        family history   . Arthritis Unknown        family history  . COPD Unknown        family history   . Cancer Unknown        multiple unknown type  . Heart attack Father        deceased at 41  . Hypertension Father   . Heart failure Father   . Lung cancer Maternal Uncle   . Throat cancer Maternal Uncle   . Colon cancer Paternal Uncle   . Colon cancer Maternal Aunt   . Anesthesia problems Neg Hx   . Hypotension Neg Hx   . Malignant hyperthermia Neg Hx   . Pseudochol deficiency Neg Hx     Social History Social History   Tobacco Use  . Smoking status: Current Every Day Smoker    Packs/day: 0.30    Years: 35.00    Pack years: 10.50    Types: Cigarettes  . Smokeless tobacco: Never Used  Substance Use Topics  . Alcohol use: Yes    Comment: rare  . Drug use: Yes    Frequency: 1.0 times per week    Types: Marijuana    Comment: +cocaine on 04/2015 UDS; rare marijuana     Allergies   Aspirin; Ketorolac tromethamine; Nsaids; Sulfonamide derivatives; Imitrex [sumatriptan base]; and Promethazine hcl   Review of Systems Review of Systems  All systems reviewed and negative, other than as noted in HPI.  Physical Exam Updated Vital Signs BP 124/81   Pulse (!) 53   Temp 97.8 F (36.6 C) (Oral)   Resp 13   Ht 5\' 2"  (1.575 m)   Wt 53.5 kg   SpO2 99%   BMI 21.58 kg/m  Physical Exam  Constitutional: She appears well-developed and well-nourished. No distress.  HENT:  Head: Normocephalic and atraumatic.  Eyes: Conjunctivae are normal. Right eye exhibits no discharge. Left eye exhibits no discharge.  Neck: Neck supple.  Cardiovascular: Normal rate, regular rhythm  and normal heart sounds. Exam reveals no gallop and no friction rub.  No murmur heard. Pulmonary/Chest: Effort normal and breath sounds normal. No respiratory distress.  Abdominal: Soft. She exhibits no distension. There is tenderness.  Mild diffuse tenderness without rebound or guarding.  No distention.  Musculoskeletal: She exhibits no edema or tenderness.  Neurological: She is alert.  Skin: Skin is warm and dry.  Psychiatric: She has a normal mood and affect. Her behavior is normal. Thought content normal.  Nursing note and vitals reviewed.    ED Treatments / Results  Labs (all labs ordered are listed, but only abnormal results are displayed) Labs Reviewed  COMPREHENSIVE METABOLIC PANEL - Abnormal; Notable for the following components:      Result Value   Potassium 2.8 (*)    Chloride 92 (*)    Glucose, Bld 111 (*)    Calcium 8.6 (*)    All other components within normal limits  CBC - Abnormal; Notable for the following components:   RBC 5.21 (*)    Hemoglobin 15.1 (*)    All other components within normal limits  LIPASE, BLOOD  TROPONIN I    EKG EKG Interpretation  Date/Time:  Friday September 03 2018 10:02:15 EDT Ventricular Rate:  103 PR Interval:  150 QRS Duration: 78 QT Interval:  322 QTC Calculation: 421 R Axis:   51 Text Interpretation:  Sinus tachycardia ST & T wave abnormality, consider inferior ischemia Abnormal ECG Confirmed by Virgel Manifold [phone]) on 09/03/2018 12:07:00 PM   Radiology No results found.   Dg Chest 2 View  Result Date: 09/03/2018 CLINICAL DATA:  Chest pain and cough EXAM: CHEST - 2 VIEW COMPARISON:  July 16, 2018 FINDINGS: There is no edema or consolidation. Patient is status post mitral valve replacement. Heart size and pulmonary vascularity are normal. No adenopathy. There is mild degenerative change in the thoracic spine. IMPRESSION: Status post mitral valve replacement. No evident adenopathy. No edema or consolidation.  Electronically Signed   By: Lowella Grip III M.D.   On: 09/03/2018 10:27    Procedures Procedures (including critical care time)  Medications Ordered in ED Medications  lactated ringers bolus 1,000 mL (0 mLs Intravenous Stopped 09/03/18 1248)  ondansetron (ZOFRAN) injection 4 mg (4 mg Intravenous Given 09/03/18 1112)  morphine 4 MG/ML injection 4 mg (4 mg Intravenous Given 09/03/18 1112)  potassium chloride SA (K-DUR,KLOR-CON) CR tablet 80 mEq (80 mEq Oral Given 09/03/18 1220)  morphine 4 MG/ML injection 4 mg (4 mg Intravenous Given 09/03/18 1342)     Initial Impression / Assessment and Plan / ED Course  I have reviewed the triage vital signs and the nursing notes.  Pertinent labs & imaging results that were available during my care of the patient were reviewed by me and considered in my medical decision making (see chart for details).     67 year old female with diarrhea for the past week.  Mild diffuse abdominal tenderness without clear focality.  I suspect viral illness.  She is treated symptomatically with much improvement.  Afebrile.  No leukocytosis.  I have a pretty low suspicion for acute surgical emergency or other emergent process.  Labs significant for hypokalemia.  This was supplemented.  Plan continue symptomatic treatment throat  I suspect is a viral GI illness.  Return precautions were discussed.  Final Clinical Impressions(s) / ED Diagnoses   Final diagnoses:  Diarrhea, unspecified type  Hypokalemia    ED Discharge Orders         Ordered    ondansetron (ZOFRAN ODT) 4 MG disintegrating tablet  Every 8 hours PRN     09/03/18 1412    diphenoxylate-atropine (LOMOTIL) 2.5-0.025 MG tablet  4 times daily PRN     09/03/18 1412           Virgel Manifold, MD 09/07/18 1540

## 2018-10-26 ENCOUNTER — Ambulatory Visit: Payer: Medicaid Other | Admitting: Nurse Practitioner

## 2018-10-27 ENCOUNTER — Ambulatory Visit: Payer: Medicaid Other | Admitting: Nurse Practitioner

## 2018-11-25 ENCOUNTER — Ambulatory Visit: Payer: Medicaid Other | Admitting: Nurse Practitioner

## 2018-12-24 ENCOUNTER — Other Ambulatory Visit: Payer: Self-pay

## 2018-12-24 ENCOUNTER — Encounter (HOSPITAL_COMMUNITY): Payer: Self-pay

## 2018-12-24 ENCOUNTER — Emergency Department (HOSPITAL_COMMUNITY)
Admission: EM | Admit: 2018-12-24 | Discharge: 2018-12-24 | Disposition: A | Discharge status: Home / Self Care | Payer: Medicare Other | Attending: Emergency Medicine | Admitting: Emergency Medicine

## 2018-12-24 DIAGNOSIS — I2510 Atherosclerotic heart disease of native coronary artery without angina pectoris: Secondary | ICD-10-CM | POA: Insufficient documentation

## 2018-12-24 DIAGNOSIS — R39198 Other difficulties with micturition: Secondary | ICD-10-CM | POA: Insufficient documentation

## 2018-12-24 DIAGNOSIS — Z79899 Other long term (current) drug therapy: Secondary | ICD-10-CM | POA: Insufficient documentation

## 2018-12-24 DIAGNOSIS — J029 Acute pharyngitis, unspecified: Secondary | ICD-10-CM | POA: Insufficient documentation

## 2018-12-24 DIAGNOSIS — Z7982 Long term (current) use of aspirin: Secondary | ICD-10-CM | POA: Diagnosis not present

## 2018-12-24 DIAGNOSIS — I10 Essential (primary) hypertension: Secondary | ICD-10-CM | POA: Diagnosis not present

## 2018-12-24 DIAGNOSIS — F17210 Nicotine dependence, cigarettes, uncomplicated: Secondary | ICD-10-CM | POA: Diagnosis not present

## 2018-12-24 LAB — URINALYSIS, ROUTINE W REFLEX MICROSCOPIC
BACTERIA UA: NONE SEEN
BILIRUBIN URINE: NEGATIVE
Glucose, UA: NEGATIVE mg/dL
KETONES UR: NEGATIVE mg/dL
NITRITE: NEGATIVE
PROTEIN: NEGATIVE mg/dL
SPECIFIC GRAVITY, URINE: 1.002 — AB (ref 1.005–1.030)
pH: 6 (ref 5.0–8.0)

## 2018-12-24 LAB — GROUP A STREP BY PCR: Group A Strep by PCR: NOT DETECTED

## 2018-12-24 NOTE — ED Provider Notes (Signed)
[facility] EMERGENCY DEPARTMENT Provider Note   CSN: [id number] Arrival date & time: 12/24/18  1316     History   Chief Complaint Chief Complaint  Patient presents with  . Dysphagia    HPI Jacqueline [name] is a 68 y.o. female.  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  She presents today with a complaint of a sore throat and some difficulty swallowing.  She denies that she has any difficulty breathing or speaking but when she swallows it feels like it is a little difficult to let it go down and felt like she had some lumps on each side of her jaw.  This all started within the last couple of hours.  No fevers chills nausea vomiting or diarrhea, no chest pain or abdominal pain though she has had some runny nose and some increased coughing over the last couple of days.  Otherwise the patient states that she feels at her baseline.  She took no medications prior to arrival and does not hardly ever use albuterol inhaler treatments.  Also complains of difficulty with passing her urine for the last couple of days, no dysuria fevers or back pain  Past Medical History:  Diagnosis Date  . Anxiety   . Atrial myxoma    Left atrial  . CAD (coronary artery disease) 04/21/2018   LHC 4/19: no sig CAD, pLAD 40  . Chronic back pain   . Drug-seeking behavior   . Gastric nodule 2009   EUS, ?leiomyoma  . 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 GERD (gastroesophageal reflux disease)   . Gout   . History of pancreatitis    Biliary and/or etoh?  . Irritable bowel syndrome   . Knee pain   . Migraines   . Migraines   . Peripheral neuropathy   . Polysubstance abuse (HCC)    opiates, cocaine, marijuana  .  Status post mitral valve repair    Echo 5/19: Mild LVH, EF 60-65, normal wall motion, status post mitral valve repair (mean gradient 4), mild LAE, mildly reduced RVSF    Patient Active Problem List   Diagnosis Date Noted  . Heme + stool 08/18/2018  . Atrial myxoma   . CAD (coronary artery disease) 04/21/2018  . S/P mitral valve repair 04/02/2018  . COPD with chronic bronchitis ([place]) 03/29/2018  . History of atrial myxoma   . Chronic pain syndrome 03/27/2018  . Tobacco abuse 03/27/2018  . HTN (hypertension) 03/27/2018  . Constipation 10/06/2017  . GERD (gastroesophageal reflux disease) 09/26/2016  . PUD (peptic ulcer disease) 10/01/2015  . Nausea with vomiting 10/01/2015  . Loss of weight 10/01/2015  . Dizzy 07/16/2015  . Major depressive disorder, recurrent, severe without psychotic features ([place])   . Substance induced mood disorder ([place]) 04/24/2015  . Opioid type dependence, continuous ([place]) 04/24/2015  . Severe major depression without psychotic features ([place]) 04/24/2015  . PTSD (post-traumatic stress disorder) 04/24/2015  . Chest pain, localized 04/28/2012  . Odynophagia 09/30/2011  . Colon cancer screening 09/30/2011  . Gastric tumor 09/30/2011  . CLOSED FRACTURE OF UPPER END OF FIBULA 07/31/2010  . Dysphagia, idiopathic 07/17/2009  . ABDOMINAL PAIN, GENERALIZED 07/17/2009  . GASTRITIS 07/12/2009  .  IBS 07/12/2009  . RUQ PAIN 07/12/2009  . EPIGASTRIC PAIN 07/12/2009  . PANCREATITIS, ACUTE, HX OF 07/12/2009  . Osteoarthrosis, unspecified whether generalized or localized, hand 11/09/2008  . OSTEOARTHRITIS, LOWER LEG 10/25/2008  . DERANGEMENT MENISCUS 10/16/2008  . KNEE PAIN 10/16/2008    Past Surgical History:  Procedure Laterality Date  . ABDOMINAL HYSTERECTOMY    . ABDOMINAL SURGERY    . BACK SURGERY    . back surgery /ray cage fusion comberg, gso    . BIOPSY  07/31/2015   Procedure: BIOPSY (DUODENAL, GASTRIC, GASTRIC ULCER, ESOPHAGEAL);  Surgeon: Danie Binder,  MD;  Location: [facility];  Service: Endoscopy;;  . CESAREAN SECTION    . CHOLECYSTECTOMY    . COLONOSCOPY  2001   Dr. Laural Golden, hemorrhoids  . COLONOSCOPY  10/21/2011   BOF:BPZWCHE polyp multiple/internal hemorrhoids  . DILATION AND CURETTAGE OF UTERUS    . ESOPHAGEAL DILATION N/A 07/31/2015   Procedure: ESOPHAGEAL DILATION WIRE GUIDED WITH SAVORY DILATORS 15MM, 16MM;  Surgeon: Danie Binder, MD;  Location: [facility];  Service: Endoscopy;  Laterality: N/A;  . ESOPHAGOGASTRODUODENOSCOPY  11/2008   A 9-mm submucosal lesion seen in the cardia., gastritis, no hpylori  . ESOPHAGOGASTRODUODENOSCOPY (EGD) WITH PROPOFOL N/A 07/31/2015   NID:[id number] ulcer and moderate gastritis, dysphagia, empirical dilation with no identified source. esophageal, gastric, duodenal bx unremarkable.   . ESOPHAGOGASTRODUODENOSCOPY (EGD) WITH PROPOFOL N/A 11/13/2015   SLF: 1. Gastric ulcer has healed. 2. single non-bleeding gastric AVMs 3. mild non-erosive gastritis.   Marland Kitchen ESOPHAGOGASTRODUODENOSCOPY (EGD) WITH PROPOFOL N/A 10/20/2017   Procedure: ESOPHAGOGASTRODUODENOSCOPY (EGD) WITH PROPOFOL;  Surgeon: Danie Binder, MD;  Location: [facility];  Service: Endoscopy;  Laterality: N/A;  2:00pm  . EXCISION OF ATRIAL MYXOMA N/A 04/01/2018   Procedure: RESECTION OF LEFT ATRIAL MYXOMA;  Surgeon: Melrose Nakayama, MD;  Location: [facility];  Service: Open Heart Surgery;  Laterality: N/A;  . FOOT SURGERY    . GALLBLADDER SURGERY  2010  . LEFT HEART CATH AND CORONARY ANGIOGRAPHY N/A 03/29/2018   Procedure: LEFT HEART CATH AND CORONARY ANGIOGRAPHY;  Surgeon: Lorretta Harp, MD;  Location: [facility];  Service: Cardiovascular;  Laterality: N/A;  . MITRAL VALVE REPAIR N/A 04/01/2018   Procedure: MITRAL VALVE REPAIR.;  Surgeon: Melrose Nakayama, MD;  Location: [place];  Service: Open Heart Surgery;  Laterality: N/A;  Mitral valve annuloplasty   . 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  Marland Kitchen SAVORY DILATION  02/03/2012   TIR:[id number] in the distal esophagus/Mild gastritis  . TEE WITHOUT CARDIOVERSION N/A 04/01/2018   Procedure: TRANSESOPHAGEAL ECHOCARDIOGRAM (TEE);  Surgeon: Melrose Nakayama, MD;  Location: [place];  Service: Open Heart Surgery;  Laterality: N/A;  . toe graft    . TONSILLECTOMY       OB History    Gravida  3   Para  2   Term  2   Preterm      AB  1   Living  2     SAB  1   TAB      Ectopic      Multiple      Live Births               Home Medications    Prior to Admission medications   Medication Sig  Start Date End Date Taking? Authorizing Provider  acetaminophen (TYLENOL) 500 MG tablet Take 500-1,000 mg by mouth every 6 (six) hours as needed (for fever.).   Yes [provider]  aspirin EC 81 MG tablet Take 1 tablet (81 mg total) by mouth daily. 07/22/18  Yes Herminio Commons, MD  atorvastatin (LIPITOR) 40 MG tablet Take 1 tablet (40 mg total) by mouth daily at 6 PM. 06/07/18  Yes Herminio Commons, MD  cetirizine (ZYRTEC) 10 MG tablet Take 10 mg by mouth as needed.    Yes [provider]  cyclobenzaprine (FLEXERIL) 10 MG tablet Take 10 mg by mouth 3 (three) times daily.  09/22/17  Yes [provider]  diphenhydrAMINE (BENADRYL) 25 mg capsule Take 25 mg by mouth every 6 (six) hours as needed (for allergies.).   Yes [provider]  diphenoxylate-atropine (LOMOTIL) 2.5-0.025 MG tablet Take 1 tablet by mouth 4 (four) times daily as needed for diarrhea or loose stools. 09/03/18  Yes Virgel Manifold, MD  FLUoxetine (PROZAC) 40 MG capsule Take 40 mg by mouth daily.  09/22/17  Yes [provider]  gabapentin (NEURONTIN) 300 MG capsule Take 2 capsules (600 mg total) by mouth 3 (three) times daily. 04/30/15  Yes Niel Hummer, NP  omeprazole (PRILOSEC) 40 MG capsule Take 1  capsule by mouth daily. 07/16/18  Yes [provider]  ondansetron (ZOFRAN ODT) 4 MG disintegrating tablet Take 1 tablet (4 mg total) by mouth every 8 (eight) hours as needed for nausea or vomiting. 09/03/18  Yes Virgel Manifold, MD  pantoprazole (PROTONIX) 40 MG tablet Take 1 tablet (40 mg total) by mouth 2 (two) times daily before a meal. 08/18/18  Yes Gill, Eric A, NP  SUBOXONE 8-2 MG FILM Place 1 tablet under the tongue 3 (three) times daily as needed for pain. 04/20/18  Yes [provider]    Family History Family History  Problem Relation Age of Onset  . Colon cancer Other   . Colon cancer Mother        diagnosed in late 31s and died age 5  . Heart failure Mother   . Heart defect Other        family history   . Arthritis Other        family history  . COPD Other        family history   . Cancer Other        multiple unknown type  . Heart attack Father        deceased at 51  . Hypertension Father   . Heart failure Father   . Lung cancer Maternal Uncle   . Throat cancer Maternal Uncle   . Colon cancer Paternal Uncle   . Colon cancer Maternal Aunt   . Anesthesia problems Neg Hx   . Hypotension Neg Hx   . Malignant hyperthermia Neg Hx   . Pseudochol deficiency Neg Hx     Social History Social History   Tobacco Use  . Smoking status: Current Every Day Smoker    Packs/day: 0.30    Years: 35.00    Pack years: 10.50    Types: Cigarettes  . Smokeless tobacco: Never Used  Substance Use Topics  . Alcohol use: Yes    Comment: rare  . Drug use: Yes    Frequency: 1.0 times per week    Types: Marijuana    Comment: +cocaine on 04/2015 UDS; rare marijuana     Allergies   Aspirin;  Ketorolac tromethamine; Nsaids; Sulfonamide derivatives; Imitrex [sumatriptan base]; and Promethazine hcl   Review of Systems Review of Systems  All other systems reviewed and are negative.    Physical Exam Updated Vital Signs BP (!) 150/105 (BP Location: Right Arm)    Pulse 84   Temp 98.3 F (36.8 C) (Oral)   Resp 12   Ht 1.575 m (5\' 2" )   Wt 54.4 kg   SpO2 94%   BMI 21.95 kg/m   Physical Exam Vitals signs and nursing note reviewed.  Constitutional:      General: She is not in acute distress.    Appearance: She is well-developed.  HENT:     Head: Normocephalic and atraumatic.     Mouth/Throat:     Pharynx: No oropharyngeal exudate.     Comments: The oropharynx is clear and moist, the phonation is normal, raspy but at baseline, there is no difficulty with opening or closing the mouth, no trismus or torticollis, tonsils are surgically absent, mucous membranes are moist and tongue appears normal Eyes:     General: No scleral icterus.       Right eye: No discharge.        Left eye: No discharge.     Conjunctiva/sclera: Conjunctivae normal.     Pupils: Pupils are equal, round, and reactive to light.  Neck:     Musculoskeletal: Normal range of motion and neck supple.     Thyroid: No thyromegaly.     Vascular: No JVD.     Comments: Bilateral adenopathy of the proximal anterior cervical chain, nontender.  The trachea is midline, the neck is very supple Cardiovascular:     Rate and Rhythm: Normal rate and regular rhythm.     Heart sounds: Normal heart sounds. No murmur. No friction rub. No gallop.   Pulmonary:     Effort: Pulmonary effort is normal. No respiratory distress.     Breath sounds: Wheezing present. No rales.  Abdominal:     General: Bowel sounds are normal. There is no distension.     Palpations: Abdomen is soft. There is no mass.     Tenderness: There is no abdominal tenderness.  Musculoskeletal: Normal range of motion.        General: No tenderness.  Lymphadenopathy:     Cervical: Cervical adenopathy present.  Skin:    General: Skin is warm and dry.     Findings: No erythema or rash.  Neurological:     Mental Status: She is alert.     Coordination: Coordination normal.  Psychiatric:        Behavior: Behavior normal.       ED Treatments / Results  Labs (all labs ordered are listed, but only abnormal results are displayed) Labs Reviewed  URINALYSIS, ROUTINE W REFLEX MICROSCOPIC - Abnormal; Notable for the following components:      Result Value   Specific Gravity, Urine 1.002 (*)    Hgb urine dipstick MODERATE (*)    Leukocytes, UA SMALL (*)    All other components within normal limits  GROUP A STREP BY PCR  URINE CULTURE    EKG None  Radiology No results found.  Procedures Procedures (including critical care time)  Medications Ordered in ED Medications - No data to display   Initial Impression / Assessment and Plan / ED Course  I have reviewed the triage vital signs and the nursing notes.  Pertinent labs & imaging results that were available during my care of the patient were  reviewed by me and considered in my medical decision making (see chart for details).  Clinical Course as of Dec 24 1512  Fri Dec 24, 2018  1511 Urinalysis reveals a low specific gravity moderate hemoglobin, 0-5 red blood cells, 6-10 white blood cells and no bacteria.  Culture was sent, strep test negative, patient stable for discharge as likely has a viral pharyngitis or cause of bilateral mild LAD.  No distress, pt agreeable.   [BM]    Clinical Course User Index [BM] Noemi Chapel, MD   The patient otherwise appears well, she does have some urinary symptoms as well but has no abdominal tenderness, check a urinalysis, rapid strep, otherwise the patient is well-appearing and I do not think this is consistent with an allergic reaction given her adenopathy and totally patent appearing airway.  The patient is agreeable to the plan.  Final Clinical Impressions(s) / ED Diagnoses   Final diagnoses:  Acute pharyngitis, unspecified etiology    ED Discharge Orders    None       Noemi Chapel, MD 12/24/18 [phone]

## 2018-12-24 NOTE — ED Triage Notes (Signed)
Pt states she just started having trouble swallowing about an hour ago. Is anxious. States her throat feels like it is swelling. Patent airway.

## 2018-12-24 NOTE — Discharge Instructions (Signed)
Your testing shows no signs of a urinary infection and no signs of strep throat.  You may take ibuprofen or Tylenol for any sore throat however if you develop fevers or increasing difficulty breathing, swelling or any other worsening symptoms please return immediately.  See your doctor in 2 or 3 days for a recheck if you are still having symptoms.

## 2018-12-24 NOTE — ED Notes (Signed)
Pt speaking in full sentences, no increased effort noted, drinking with no problem.

## 2018-12-24 NOTE — ED Notes (Signed)
ED Provider at bedside. 

## 2018-12-26 LAB — URINE CULTURE
Culture: <10000 — AB
Special Requests: NORMAL

## 2019-05-12 ENCOUNTER — Ambulatory Visit: Payer: Medicare Other | Admitting: Orthopaedic Surgery

## 2019-06-29 ENCOUNTER — Telehealth: Payer: Self-pay | Admitting: Cardiovascular Disease

## 2019-06-29 NOTE — Telephone Encounter (Signed)
Pt notified, will see GI

## 2019-06-29 NOTE — Telephone Encounter (Signed)
Pt states that she's having continuous heartburn everyday and that nothing helps get rid of it, states she's tried everything she can think of to help get rid of it. She was speaking to the pharmacists at [facility] and they advised her to speak w/ her cardiologist because it may have something to do with her heart.

## 2019-06-29 NOTE — Telephone Encounter (Signed)
Reflux has been extra uncomfortable the last 2 months.She is going to call Dr.Fields

## 2019-06-29 NOTE — Telephone Encounter (Signed)
I agree. Needs to speak with GI. Had nonobstructive CAD by cath last year.

## 2019-08-02 NOTE — Progress Notes (Signed)
Referring Provider: Neale Burly, MD Primary Care Physician:  Neale Burly, MD Primary GI Physician: Dr. Oneida Alar  Chief Complaint  Patient presents with   Gastroesophageal Reflux    HPI:   Jacqueline [name] is a 68 y.o. female presenting today for worsening GERD symptoms. Known history of GERD, IBS usually with constipation but occasional diarrhea, and persistent nausea.   Last EGD 10/20/2017: Web in upper third of esophagus, dilated. Mild gastritis due to NSAIDs. Was felt nausea and vomiting/abdominal pain secondary to uncontrolled GERD/gastritis. Colonoscopy up to date in 2012. Due for repeat in 2022.   Patient last seen in August 2019 for constipation, GERD, and heme positive stools. At that time she had recently had open heart surgery for excision of large atrial myxoma and mitral valve repair in April 2019 and was on Coumadin for 3 months. Switched to aspirin 2 weeks prior to visit. She was having dark stools and was found to be heme positive by PCP the last week she was on Coumadin. Since stopping Coumadin, this resolved. Also continued to have nausea and recurrent GERD symptoms. Recommendations to change PPI from Prilosec to Protonix BID and check CBC and iron/Ferritin. These labs were not completed. CBC in the ED in September 2019 with hemoglobin 15.1.   Today she states her reflux and heartburn started getting worse about 3-4 weeks ago and about 1-2 weeks ago was occurring every day for most of the day. States she will be on an acid medication for a while and it will work, but then they seem to stop working and have to be changed. Taking Protonix 40 mg twice a day 30 minutes before she eats. Tried pepto bismol and tums. Also drinking milk. States she has thrown up a couple times when her reflux was really bad. No hematemesis. Over the last week has been eating bland foods which has helped calm symptoms down. No reflux so far today. Roommate is now at home and cooking a lot more  with a lot of spices and foods with high fat content. Notes that foods with a lot of spices and coffee sometimes will worsen her reflux symptoms. Then states the cooking looks so good, she just has to eat it even if she knows it will cause reflux. Also notes she has gained weight. No upper abdominal pain. Continues to have nausea on and off. Depends on what she eats. Rare solid food dysphagia. Taking ibuprofen 800 mg once a day for the last 6 months. No alcohol. No black stools. No hematochezia.  States her constipation is doing better than it used to. BM every 2-3 days. No straining. Stools sometimes with hard balls. No diarrhea. Has been on Linzess in the past. Not taking anything at this time. No lower abdominal pain.   Denies fever, chills, fatigue, lightheadedness, dizziness, pre-syncope or syncope.    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   Irritable bowel syndrome  Knee pain    Migraines    Migraines    Peripheral neuropathy    Polysubstance abuse (HCC)    opiates, cocaine, marijuana   Status post mitral valve repair    Echo 5/19: Mild LVH, EF 60-65, normal wall motion, status post mitral valve repair (mean gradient 4), mild LAE, mildly reduced RVSF    Past Surgical History:  Procedure Laterality Date   ABDOMINAL HYSTERECTOMY     ABDOMINAL SURGERY     BACK SURGERY     back surgery /ray cage fusion comberg, gso     BIOPSY  07/31/2015   Procedure: BIOPSY (DUODENAL, GASTRIC, GASTRIC ULCER, ESOPHAGEAL);  Surgeon: Danie Binder,  MD;  Location: [facility];  Service: Endoscopy;;   CESAREAN SECTION     CHOLECYSTECTOMY     COLONOSCOPY  2001   Dr. Laural Golden, hemorrhoids   COLONOSCOPY  10/21/2011   [id number] polyp multiple/internal hemorrhoids   DILATION AND CURETTAGE OF UTERUS     ESOPHAGEAL DILATION N/A 07/31/2015   Procedure: ESOPHAGEAL DILATION WIRE GUIDED WITH SAVORY DILATORS 15MM, 16MM;  Surgeon: Danie Binder, MD;  Location: [facility];  Service: Endoscopy;  Laterality: N/A;   ESOPHAGOGASTRODUODENOSCOPY  11/2008   A 9-mm submucosal lesion seen in the cardia., gastritis, no hpylori   ESOPHAGOGASTRODUODENOSCOPY (EGD) WITH PROPOFOL N/A 07/31/2015   [id number] ulcer and moderate gastritis, dysphagia, empirical dilation with no identified source. esophageal, gastric, duodenal bx unremarkable.    ESOPHAGOGASTRODUODENOSCOPY (EGD) WITH PROPOFOL N/A 11/13/2015   SLF: 1. Gastric ulcer has healed. 2. single non-bleeding gastric AVMs 3. mild non-erosive gastritis.    ESOPHAGOGASTRODUODENOSCOPY (EGD) WITH PROPOFOL N/A 10/20/2017   Procedure: ESOPHAGOGASTRODUODENOSCOPY (EGD) WITH PROPOFOL;  Surgeon: Danie Binder, MD;  Location: [facility];  Service: Endoscopy;  Laterality: N/A;  2:00pm   EXCISION OF ATRIAL MYXOMA N/A 04/01/2018   Procedure: RESECTION OF LEFT ATRIAL MYXOMA;  Surgeon: Melrose Nakayama, MD;  Location: [place];  Service: Open Heart Surgery;  Laterality: N/A;   FOOT SURGERY     GALLBLADDER SURGERY  2010   LEFT HEART CATH AND CORONARY ANGIOGRAPHY N/A 03/29/2018   Procedure: LEFT HEART CATH AND CORONARY ANGIOGRAPHY;  Surgeon: Lorretta Harp, MD;  Location: [facility];  Service: Cardiovascular;  Laterality: N/A;   MITRAL VALVE REPAIR N/A 04/01/2018   Procedure: MITRAL VALVE REPAIR.;  Surgeon: Melrose Nakayama, MD;  Location: [place];  Service: Open Heart Surgery;  Laterality: N/A;  Mitral valve annuloplasty    PARTIAL GASTRECTOMY  1990   stomach tumor (large submucosal tumor felt to be be a  myoma, but 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   SAVORY DILATION  02/03/2012   [id number] in the distal esophagus/Mild gastritis   TEE WITHOUT CARDIOVERSION N/A 04/01/2018   Procedure: TRANSESOPHAGEAL ECHOCARDIOGRAM (TEE);  Surgeon: Melrose Nakayama, MD;  Location: [place];  Service: Open Heart Surgery;  Laterality: N/A;   toe graft     TONSILLECTOMY      Current Outpatient Medications  Medication Sig Dispense Refill   acetaminophen (TYLENOL) 500 MG tablet Take 500-1,000 mg by mouth every 6 (six) hours as needed (for fever.).     aspirin EC 81 MG tablet Take 1 tablet (81 mg total) by mouth daily. 90 tablet 3   atorvastatin (LIPITOR) 40 MG tablet Take 1 tablet (40 mg total) by mouth daily at 6 PM. 30 tablet 6   cetirizine (ZYRTEC) 10 MG  tablet Take 10 mg by mouth as needed.      cyclobenzaprine (FLEXERIL) 10 MG tablet Take 10 mg by mouth 2 (two) times daily.   0   diphenhydrAMINE (BENADRYL) 25 mg capsule Take 25 mg by mouth every 6 (six) hours as needed (for allergies.).     FLUoxetine (PROZAC) 40 MG capsule Take 40 mg by mouth daily.   0   gabapentin (NEURONTIN) 300 MG capsule Take 2 capsules (600 mg total) by mouth 3 (three) times daily. 180 capsule 0   ondansetron (ZOFRAN ODT) 4 MG disintegrating tablet Take 1 tablet (4 mg total) by mouth every 8 (eight) hours as needed for nausea or vomiting. 12 tablet 0   pantoprazole (PROTONIX) 40 MG tablet Take 1 tablet (40 mg total) by mouth 2 (two) times daily before a meal. 180 tablet 1   SUBOXONE 8-2 MG FILM Place 1 tablet under the tongue 3 (three) times daily as needed for pain.  0   dexlansoprazole (DEXILANT) 60 MG capsule Take 1 capsule (60 mg total) by mouth daily. 30 capsule 5   No current facility-administered medications for this visit.     Allergies as of 08/03/2019 - Review Complete 08/03/2019  Allergen Reaction Noted   Aspirin Other (See  Comments)    Ketorolac tromethamine Other (See Comments) 09/30/2011   Nsaids Other (See Comments) 12/18/2011   Sulfonamide derivatives Nausea And Vomiting and Other (See Comments)    Imitrex [sumatriptan base] Palpitations 09/30/2011   Promethazine hcl Other (See Comments) 12/18/2011    Family History  Problem Relation Age of Onset   Colon cancer Other    Colon cancer Mother        diagnosed in late 53s and died age 67   Heart failure Mother    Heart defect Other        family history    Arthritis Other        family history   COPD Other        family history    Cancer Other        multiple unknown type   Heart attack Father        deceased at 60   Hypertension Father    Heart failure Father    Lung cancer Maternal Uncle    Throat cancer Maternal Uncle    Colon cancer Paternal Uncle    Colon cancer Maternal Aunt    Anesthesia problems Neg Hx    Hypotension Neg Hx    Malignant hyperthermia Neg Hx    Pseudochol deficiency Neg Hx     Social History   Socioeconomic History   Marital status: Divorced    Spouse name: Not on file   Number of children: 2   Years of education: 10th grade   Highest education level: Not on file  Occupational History   Occupation: disabled: back problems   Social Designer, fashion/clothing strain: Not on file   Food insecurity    Worry: Not on file    Inability: Not on file   Transportation needs    Medical: Not on file    Non-medical: Not on file  Tobacco Use   Smoking status: Current Every Day Smoker    Packs/day: 0.30    Years: 35.00    Pack years: 10.50    Types: Cigarettes   Smokeless tobacco: Never Used  Substance and Sexual Activity   Alcohol use: Not Currently    Comment: rare   Drug use: Yes  Frequency: 1.0 times per week    Types: Marijuana    Comment: occ marijuana   Sexual activity: Not on file  Lifestyle   Physical activity    Days per week: Not on file    Minutes per  session: Not on file   Stress: Not on file  Relationships   Social connections    Talks on phone: Not on file    Gets together: Not on file    Attends religious service: Not on file    Active member of club or organization: Not on file    Attends meetings of clubs or organizations: Not on file    Relationship status: Not on file  Other Topics Concern   Not on file  Social History Narrative   Not on file    Review of Systems: Gen: See HPI CV: Denies peripheral edema Resp: Admits to occasional SOB with exertion. States she has discussed with PCP before, but is going to discuss with PCP at upcoming visit. Occasional cough which is chronic secondary to smoking.  GI: See HPI Derm: Denies rash, itching, dry skin Psych: Admits to depression, anxiety. Prozac works well.   Heme: Denies bruising, bleeding  Physical Exam: BP 123/69    Pulse 83    Temp (!) 96.8 F (36 C) (Temporal)    Ht 5\' 2"  (1.575 m)    Wt 132 lb 6.4 oz (60.1 kg)    BMI 24.22 kg/m  General:   Alert and oriented. No distress noted. Pleasant and cooperative. When walking to exam table, she is limping due to hip pain.  Head:  Normocephalic and atraumatic. Eyes:  Conjuctiva clear without scleral icterus. Heart:  S1, S2 present without murmurs appreciated. Lungs:  Clear to auscultation bilaterally. No wheezes, rales, or rhonchi. No distress.  Abdomen:  +BS, soft, non-distended. Moderately tender to palpation in epigastric area. Minimal tenderness across lower abdomen. No rebound or guarding. No HSM or masses noted. Msk:  Symmetrical without gross deformities. Normal posture. Extremities:  Without edema. Neurologic:  Alert and  oriented x4 Psych: Normal mood and affect.

## 2019-08-03 ENCOUNTER — Other Ambulatory Visit: Payer: Self-pay

## 2019-08-03 ENCOUNTER — Ambulatory Visit (INDEPENDENT_AMBULATORY_CARE_PROVIDER_SITE_OTHER): Payer: Medicare Other | Admitting: Gastroenterology

## 2019-08-03 ENCOUNTER — Encounter: Payer: Self-pay | Admitting: Gastroenterology

## 2019-08-03 VITALS — BP 123/69 | HR 83 | Temp 96.8°F | Ht 62.0 in | Wt 132.4 lb

## 2019-08-03 DIAGNOSIS — K5900 Constipation, unspecified: Secondary | ICD-10-CM

## 2019-08-03 DIAGNOSIS — K219 Gastro-esophageal reflux disease without esophagitis: Secondary | ICD-10-CM | POA: Diagnosis not present

## 2019-08-03 MED ORDER — DEXLANSOPRAZOLE 60 MG PO CPDR: 60.0000 mg | DELAYED_RELEASE_CAPSULE | Freq: Every day | ORAL | 5 refills | Status: DC

## 2019-08-03 NOTE — Assessment & Plan Note (Addendum)
68 y.o. female with chronic history of GERD who has been on Protonix BID since August 2019. Symptoms had been fairly well controlled until about 1 month ago. About 1 week ago symptoms were the worst with reflux daily for most of the day. Tried adding tums and pepto bismol with no significant improvement. The only change is her roommate is no longer working and is cooking at home with a lot of spices and fatty foods which she has been eating. She has gained about 12 lbs since January. Continues to have intermittent nausea. Has only vomited twice when reflux was really bad. Rare solid food dysphagia. No melena, hematochezia, or hematemesis. Denies upper abdominal pain, but she did have moderate tenderness to palpation on exam today in the epigastric area. Eating bland diet the last week has helped calm symptoms down. Taking Ibuprofen 800 mg daily for the last 6 months. Last EGD October 2018 with mild gastritis due to NSAIDs.  Suspect patient is having GERD exacerbation in the setting of daily ibuprofen and not following a GERD diet. It is possible she has gastritis, duodenitis, esophagitis, or PUD.   Will change Protonix to Dexilant 60 mg daily.  Advised patient to focus on strict dietary changes avoiding foods she knows is going to cause symptoms including spicy foods, fatty foods, and beverages with caffeine. Also provided GERD handout.  Decrease ibuprofen use. Try to use tylenol instead.  Follow-up in 4 weeks. If no significant improvement, will consider repeat EGD.

## 2019-08-03 NOTE — Assessment & Plan Note (Signed)
Constipation has improved. BMs every 2-3 days. Usually soft without straining. Sometimes with hard balls. No hematochezia or melena. Not taking anything at this time. Has taken Linzess 145 mcg in the past. As she is having BMs every few days, I do not think she needs Linzess at this time. Advised she add benefiber or metamucil daily to help with constipation and use MiraLAX 1 capful (17g) daily with 8 oz of water as needed. If this doesn't improve bowel habits, could consider low dose Linzess.

## 2019-08-03 NOTE — Patient Instructions (Addendum)
I have sent in Strang 60 mg to your pharmacy. Once you pick this up, stop taking Protonix and take Dexilant once every morning. You do not have to take this 30 minutes before eating. Let us know if this is too expensive.  Please decrease ibuprofen use. Try to use tylenol instead. This is likely contributing to your symptoms.   I would like you to focus on strict dietary changes to see if this improves symptoms as well. Please avoid foods that are known triggers for you and see GERD handout below.   Add benefiber or metamucil daily to help with constipation. Start with the low dose and increase slowly.   You may also use Miralax 1 capful (17g) daily as needed with 8 oz of water.   We will see you back in 4 weeks.   Aliene Altes, PA-C [facility]       Food Choices for Gastroesophageal Reflux Disease, Adult When you have gastroesophageal reflux disease (GERD), the foods you eat and your eating habits are very important. Choosing the right foods can help ease your discomfort. Think about working with a nutrition specialist (dietitian) to help you make good choices. What are tips for following this plan?  Meals  Choose healthy foods that are low in fat, such as fruits, vegetables, whole grains, low-fat dairy products, and lean meat, fish, and poultry.  Eat small meals often instead of 3 large meals a day. Eat your meals slowly, and in a place where you are relaxed. Avoid bending over or lying down until 2-3 hours after eating.  Avoid eating meals 2-3 hours before bed.  Avoid drinking a lot of liquid with meals.  Cook foods using methods other than frying. Bake, grill, or broil food instead.  Avoid or limit: ? Chocolate. ? Peppermint or spearmint. ? Alcohol. ? Pepper. ? Black and decaffeinated coffee. ? Black and decaffeinated tea. ? Bubbly (carbonated) soft drinks. ? Caffeinated energy drinks and soft drinks.  Limit high-fat foods such as: ? Fatty meat or  fried foods. ? Whole milk, cream, butter, or ice cream. ? Nuts and nut butters. ? Pastries, donuts, and sweets made with butter or shortening.  Avoid foods that cause symptoms. These foods may be different for everyone. Common foods that cause symptoms include: ? Tomatoes. ? Oranges, lemons, and limes. ? Peppers. ? Spicy food. ? Onions and garlic. ? Vinegar. Lifestyle  Maintain a healthy weight. Ask your doctor what weight is healthy for you. If you need to lose weight, work with your doctor to do so safely.  Exercise for at least 30 minutes for 5 or more days each week, or as told by your doctor.  Wear loose-fitting clothes.  Do not smoke. If you need help quitting, ask your doctor.  Sleep with the head of your bed higher than your feet. Use a wedge under the mattress or blocks under the bed frame to raise the head of the bed. Summary  When you have gastroesophageal reflux disease (GERD), food and lifestyle choices are very important in easing your symptoms.  Eat small meals often instead of 3 large meals a day. Eat your meals slowly, and in a place where you are relaxed.  Limit high-fat foods such as fatty meat or fried foods.  Avoid bending over or lying down until 2-3 hours after eating.  Avoid peppermint and spearmint, caffeine, alcohol, and chocolate. This information is not intended to replace advice given to you by your health care provider. Make sure  you discuss any questions you have with your health care provider. Document Released: 06/08/2012 Document Revised: 03/31/2019 Document Reviewed: 01/13/2017 Elsevier Patient Education  2020 [employer].

## 2019-08-09 NOTE — Progress Notes (Signed)
CC'D TO PCP °

## 2019-09-06 NOTE — Progress Notes (Deleted)
Referring Provider: Neale Burly, MD Primary Care Physician:  Neale Burly, MD Primary GI Physician: Dr. Oneida Alar  No chief complaint on file.   HPI:   Jacqueline [name] is a 68 y.o. female presenting for follow-up of GERD and constipation. GI history significant for GERD, IBS usually with constipation but occasional diarrhea, and persistent nausea. Last EGD 10/20/2017: Web in upper third of esophagus, dilated. Mild gastritis due to NSAIDs. Was felt nausea and vomiting/abdominal pain secondary to uncontrolled GERD/gastritis. Colonoscopy up to date in 2012. Due for repeat in 2022.   She was last seen on 08/03/2019 for the same.  At that time she was complaining of worsening GERD symptoms x 3-4 weeks.  Had been taking Protonix twice daily since August 2019.  Prior had been on Prilosec.  Also admitted to rare solid food dysphagia.  Suspected dietary habits were influencing patient's worsening symptoms as she had gained about 12 pounds since January and was eating more spicy and fatty foods.  Was also taking 800 mg ibuprofen daily.  Protonix was changed to Dexilant 60 mg daily.  Advised patient to follow strict GERD diet and decrease ibuprofen.  Her constipation had improved with BMs every 2-3 days without Linzess.  Advised to add daily fiber supplement and use MiraLAX as needed.  Today she states  Past Medical History:  Diagnosis Date  . Anxiety   . Atrial myxoma    Left atrial  . CAD (coronary artery disease) 04/21/2018   LHC 4/19: no sig CAD, pLAD 40  . Chronic back pain   . Drug-seeking behavior   . Gastric nodule 2009   EUS, ?leiomyoma  . 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 GERD (gastroesophageal reflux disease)   . Gout   . History of pancreatitis    Biliary and/or etoh?  . Irritable bowel syndrome   . Knee pain   . Migraines   . Migraines   . Peripheral neuropathy   .  Polysubstance abuse (HCC)    opiates, cocaine, marijuana  . Status post mitral valve repair    Echo 5/19: Mild LVH, EF 60-65, normal wall motion, status post mitral valve repair (mean gradient 4), mild LAE, mildly reduced RVSF    Past Surgical History:  Procedure Laterality Date  . ABDOMINAL HYSTERECTOMY    . ABDOMINAL SURGERY    . BACK SURGERY    . back surgery /ray cage fusion comberg, gso    . BIOPSY  07/31/2015   Procedure: BIOPSY (DUODENAL, GASTRIC, GASTRIC ULCER, ESOPHAGEAL);  Surgeon: Danie Binder, MD;  Location: [facility];  Service: Endoscopy;;  . CESAREAN SECTION    . CHOLECYSTECTOMY    . COLONOSCOPY  2001   Dr. Laural Golden, hemorrhoids  . COLONOSCOPY  10/21/2011   [id number] polyp multiple/internal hemorrhoids  . DILATION AND CURETTAGE OF UTERUS    . ESOPHAGEAL DILATION N/A 07/31/2015   Procedure: ESOPHAGEAL DILATION WIRE GUIDED WITH SAVORY DILATORS 15MM, 16MM;  Surgeon: Danie Binder, MD;  Location: [facility];  Service: Endoscopy;  Laterality: N/A;  . ESOPHAGOGASTRODUODENOSCOPY  11/2008   A 9-mm submucosal lesion seen in the cardia., gastritis, no hpylori  . ESOPHAGOGASTRODUODENOSCOPY (EGD) WITH PROPOFOL N/A 07/31/2015   [id number] ulcer and moderate gastritis, dysphagia, empirical dilation with no identified source. esophageal, gastric, duodenal bx unremarkable.   . ESOPHAGOGASTRODUODENOSCOPY (EGD) WITH PROPOFOL N/A 11/13/2015   SLF: 1. Gastric ulcer has  healed. 2. single non-bleeding gastric AVMs 3. mild non-erosive gastritis.   Marland Kitchen ESOPHAGOGASTRODUODENOSCOPY (EGD) WITH PROPOFOL N/A 10/20/2017   Procedure: ESOPHAGOGASTRODUODENOSCOPY (EGD) WITH PROPOFOL;  Surgeon: Danie Binder, MD;  [facility];  Service: Endoscopy;  Laterality: N/A;  2:00pm  . EXCISION OF ATRIAL MYXOMA N/A 04/01/2018   Procedure: RESECTION OF LEFT ATRIAL MYXOMA;  Surgeon: Melrose Nakayama, MD;  Location: [facility];  Service: Open Heart Surgery;  Laterality: N/A;  . FOOT SURGERY    . GALLBLADDER SURGERY   2010  . LEFT HEART CATH AND CORONARY ANGIOGRAPHY N/A 03/29/2018   Procedure: LEFT HEART CATH AND CORONARY ANGIOGRAPHY;  Surgeon: Lorretta Harp, MD;  Location: [facility];  Service: Cardiovascular;  Laterality: N/A;  . MITRAL VALVE REPAIR N/A 04/01/2018   Procedure: MITRAL VALVE REPAIR.;  Surgeon: Melrose Nakayama, MD;  Location: [facility];  Service: Open Heart Surgery;  Laterality: N/A;  Mitral valve annuloplasty   . 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  Marland Kitchen SAVORY DILATION  02/03/2012   [id number] in the distal esophagus/Mild gastritis  . TEE WITHOUT CARDIOVERSION N/A 04/01/2018   Procedure: TRANSESOPHAGEAL ECHOCARDIOGRAM (TEE);  Surgeon: Melrose Nakayama, MD;  Location: [facility];  Service: Open Heart Surgery;  Laterality: N/A;  . toe graft    . TONSILLECTOMY      Current Outpatient Medications  Medication Sig Dispense Refill  . acetaminophen (TYLENOL) 500 MG tablet Take 500-1,000 mg by mouth every 6 (six) hours as needed (for fever.).    Marland Kitchen aspirin EC 81 MG tablet Take 1 tablet (81 mg total) by mouth daily. 90 tablet 3  . atorvastatin (LIPITOR) 40 MG tablet Take 1 tablet (40 mg total) by mouth daily at 6 PM. 30 tablet 6  . cetirizine (ZYRTEC) 10 MG tablet Take 10 mg by mouth as needed.     . cyclobenzaprine (FLEXERIL) 10 MG tablet Take 10 mg by mouth 2 (two) times daily.   0  . dexlansoprazole (DEXILANT) 60 MG capsule Take 1 capsule (60 mg total) by mouth daily. 30 capsule 5  . diphenhydrAMINE (BENADRYL) 25 mg capsule Take 25 mg by mouth every 6 (six) hours as needed (for allergies.).    Marland Kitchen FLUoxetine (PROZAC) 40 MG capsule Take 40 mg by mouth daily.   0  . gabapentin (NEURONTIN) 300 MG capsule Take 2 capsules (600 mg total) by mouth 3 (three) times daily. 180 capsule 0  . ondansetron (ZOFRAN ODT) 4 MG disintegrating  tablet Take 1 tablet (4 mg total) by mouth every 8 (eight) hours as needed for nausea or vomiting. 12 tablet 0  . pantoprazole (PROTONIX) 40 MG tablet Take 1 tablet (40 mg total) by mouth 2 (two) times daily before a meal. 180 tablet 1  . SUBOXONE 8-2 MG FILM Place 1 tablet under the tongue 3 (three) times daily as needed for pain.  0   No current facility-administered medications for this visit.     Allergies as of 09/07/2019 - Review Complete 08/03/2019  Allergen Reaction Noted  . Aspirin Other (See Comments)   . Ketorolac tromethamine Other (See Comments) 09/30/2011  . Nsaids Other (See Comments) 12/18/2011  . Sulfonamide derivatives Nausea And Vomiting and Other (See Comments)   . Imitrex [sumatriptan base] Palpitations 09/30/2011  . Promethazine hcl Other (See Comments) 12/18/2011  Family History  Problem Relation Age of Onset  . Colon cancer Other   . Colon cancer Mother        diagnosed in late 13s and died age 55  . Heart failure Mother   . Heart defect Other        family history   . Arthritis Other        family history  . COPD Other        family history   . Cancer Other        multiple unknown type  . Heart attack Father        deceased at 33  . Hypertension Father   . Heart failure Father   . Lung cancer Maternal Uncle   . Throat cancer Maternal Uncle   . Colon cancer Paternal Uncle   . Colon cancer Maternal Aunt   . Anesthesia problems Neg Hx   . Hypotension Neg Hx   . Malignant hyperthermia Neg Hx   . Pseudochol deficiency Neg Hx     Social History   Socioeconomic History  . Marital status: Divorced    Spouse name: Not on file  . Number of children: 2  . Years of education: 10th grade  . Highest education level: Not on file  Occupational History  . Occupation: disabled: back problems   Social Needs  . Financial resource strain: Not on file  . Food insecurity    Worry: Not on file    Inability: Not on file  . Transportation needs     Medical: Not on file    Non-medical: Not on file  Tobacco Use  . Smoking status: Current Every Day Smoker    Packs/day: 0.30    Years: 35.00    Pack years: 10.50    Types: Cigarettes  . Smokeless tobacco: Never Used  Substance and Sexual Activity  . Alcohol use: Not Currently    Comment: rare  . Drug use: Yes    Frequency: 1.0 times per week    Types: Marijuana    Comment: occ marijuana  . Sexual activity: Not on file  Lifestyle  . Physical activity    Days per week: Not on file    Minutes per session: Not on file  . Stress: Not on file  Relationships  . Social Herbalist on phone: Not on file    Gets together: Not on file    Attends religious service: Not on file    Active member of club or organization: Not on file    Attends meetings of clubs or organizations: Not on file    Relationship status: Not on file  Other Topics Concern  . Not on file  Social History Narrative  . Not on file    Review of Systems: Gen: Denies fever, chills, anorexia. Denies fatigue, weakness, weight loss.  CV: Denies chest pain, palpitations, syncope, peripheral edema, and claudication. Resp: Denies dyspnea at rest, cough, wheezing, coughing up blood, and pleurisy. GI: Denies vomiting blood, jaundice, and fecal incontinence.   Denies dysphagia or odynophagia. Derm: Denies rash, itching, dry skin Psych: Denies depression, anxiety, memory loss, confusion. No homicidal or suicidal ideation.  Heme: Denies bruising, bleeding, and enlarged lymph nodes.  Physical Exam: There were no vitals taken for this visit. General:   Alert and oriented. No distress noted. Pleasant and cooperative.  Head:  Normocephalic and atraumatic. Eyes:  Conjuctiva clear without scleral icterus. Mouth:  Oral mucosa pink and moist. Good dentition.  No lesions. Heart:  S1, S2 present without murmurs appreciated. Lungs:  Clear to auscultation bilaterally. No wheezes, rales, or rhonchi. No distress.  Abdomen:   +BS, soft, non-tender and non-distended. No rebound or guarding. No HSM or masses noted. Msk:  Symmetrical without gross deformities. Normal posture. Extremities:  Without edema. Neurologic:  Alert and  oriented x4 Psych:  Alert and cooperative. Normal mood and affect.

## 2019-09-07 ENCOUNTER — Ambulatory Visit: Payer: Medicare Other | Admitting: Gastroenterology

## 2019-11-21 ENCOUNTER — Other Ambulatory Visit: Payer: Self-pay | Admitting: Sports Medicine

## 2019-11-21 ENCOUNTER — Other Ambulatory Visit (HOSPITAL_COMMUNITY): Payer: Self-pay | Admitting: Sports Medicine

## 2019-11-21 DIAGNOSIS — M25512 Pain in left shoulder: Secondary | ICD-10-CM

## 2019-12-02 ENCOUNTER — Other Ambulatory Visit: Payer: Self-pay

## 2019-12-02 ENCOUNTER — Ambulatory Visit (HOSPITAL_COMMUNITY)
Admission: RE | Admit: 2019-12-02 | Discharge: 2019-12-02 | Disposition: A | Discharge status: Home / Self Care | Payer: Medicare Other | Source: Ambulatory Visit | Attending: Sports Medicine | Admitting: Sports Medicine

## 2019-12-02 DIAGNOSIS — M25512 Pain in left shoulder: Secondary | ICD-10-CM | POA: Insufficient documentation

## 2020-01-13 ENCOUNTER — Telehealth: Payer: Self-pay | Admitting: Cardiovascular Disease

## 2020-01-13 NOTE — Telephone Encounter (Signed)

## 2020-01-16 ENCOUNTER — Telehealth (INDEPENDENT_AMBULATORY_CARE_PROVIDER_SITE_OTHER): Payer: Medicare Other | Admitting: Cardiovascular Disease

## 2020-01-16 ENCOUNTER — Encounter: Payer: Self-pay | Admitting: Cardiovascular Disease

## 2020-01-16 VITALS — Ht 62.0 in | Wt 126.0 lb

## 2020-01-16 DIAGNOSIS — Z86018 Personal history of other benign neoplasm: Secondary | ICD-10-CM

## 2020-01-16 DIAGNOSIS — I2510 Atherosclerotic heart disease of native coronary artery without angina pectoris: Secondary | ICD-10-CM

## 2020-01-16 DIAGNOSIS — Z9889 Other specified postprocedural states: Secondary | ICD-10-CM | POA: Diagnosis not present

## 2020-01-16 DIAGNOSIS — Z720 Tobacco use: Secondary | ICD-10-CM

## 2020-01-16 NOTE — Patient Instructions (Signed)
Medication Instructions:  Your physician recommends that you continue on your current medications as directed. Please refer to the Current Medication list given to you today.  *If you need a refill on your cardiac medications before your next appointment, please call your pharmacy*  Lab Work: NONE If you have labs (blood work) drawn today and your tests are completely normal, you will receive your results only by: Marland Kitchen MyChart Message (if you have MyChart) OR . A paper copy in the mail If you have any lab test that is abnormal or we need to change your treatment, we will call you to review the results.  Testing/Procedures: NONE  Follow-Up: At [facility], you and your health needs are our priority.  As part of our continuing mission to provide you with exceptional heart care, we have created designated Provider Care Teams.  These Care Teams include your primary Cardiologist (physician) and Advanced Practice Providers (APPs -  Physician Assistants and Nurse Practitioners) who all work together to provide you with the care you need, when you need it.  Your next appointment:   12 month(s)  The format for your next appointment:   Virtual Visit   Provider:   Kate Sable, MD  Other Instructions NONE     Thank you for choosing [facility] !

## 2020-01-16 NOTE — Progress Notes (Signed)
Virtual Visit via Telephone Note   This visit type was conducted due to national recommendations for restrictions regarding the COVID-19 Pandemic (e.g. social distancing) in an effort to limit this patient's exposure and mitigate transmission in our community.  Due to her co-morbid illnesses, this patient is at least at moderate risk for complications without adequate follow up.  This format is felt to be most appropriate for this patient at this time.  The patient did not have access to video technology/had technical difficulties with video requiring transitioning to audio format only (telephone).  All issues noted in this document were discussed and addressed.  No physical exam could be performed with this format.  Please refer to the patient's chart for her  consent to telehealth for [facility].   Date:  01/16/2020   ID:  Jacqueline [name], DOB May 12, 1951, MRN [id number]  Patient Location: Home Provider Location: Home  PCP:  Jacqueline Burly, MD  Cardiologist:  Jacqueline Sable, MD  Electrophysiologist:  None   Evaluation Performed:  Follow-Up Visit  Chief Complaint: Mitral valve repair  History of Present Illness:    Jacqueline [name] is a 69 y.o. female with a history of mitral valve repair and atrial myxoma.  She was hospitalized in April 2019 at [facility] for chest pain and shortness of breath and found to be in acute heart failure.  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  Cardiac catheterization demonstrated no significant coronary artery disease with a proximal 40% LAD stenosis.  She was evaluated by cardiothoracic surgery and underwent resection for atrial myxoma with atrial septal patch and mitral valve repair on 04/01/2018.  She was placed on warfarin for antithrombotic therapy with a plan to remain on it for 3 months with a goal INR of 2.5 followed by aspirin.  She has chronic exertional dyspnea and smokes 1 ppd.  She was recently  tested for COVID and is negative.  She denies chest pain. She has varicose veins and tries to keep her legs elevated at home. She denies leg swelling. She has bilateral lower extremity neuropathy.    Past Medical History:  Diagnosis Date  . Anxiety   . Atrial myxoma    Left atrial  . CAD (coronary artery disease) 04/21/2018   LHC 4/19: no sig CAD, pLAD 40  . Chronic back pain   . Drug-seeking behavior   . Gastric nodule 2009   EUS, ?leiomyoma  . 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 GERD (gastroesophageal reflux disease)   . Gout   . History of pancreatitis    Biliary and/or etoh?  . Irritable bowel syndrome   . Knee pain   . Migraines   . Migraines   . Peripheral neuropathy   . Polysubstance abuse (HCC)    opiates, cocaine, marijuana  . Status post mitral valve repair    Echo 5/19: Mild LVH, EF 60-65, normal wall motion, status post mitral valve repair (mean gradient 4), mild LAE, mildly reduced RVSF   Past Surgical History:  Procedure Laterality Date  . ABDOMINAL HYSTERECTOMY    . ABDOMINAL SURGERY    . BACK SURGERY    . back surgery /ray cage fusion comberg, gso    . BIOPSY  07/31/2015   Procedure: BIOPSY (DUODENAL, GASTRIC, GASTRIC ULCER, ESOPHAGEAL);  Surgeon: Jacqueline Binder, MD;  Location: [facility];  Service: Endoscopy;;  .  CESAREAN SECTION    . CHOLECYSTECTOMY    . COLONOSCOPY  2001   Dr. Laural [name], hemorrhoids  . COLONOSCOPY  10/21/2011   [id number] polyp multiple/internal hemorrhoids  . DILATION AND CURETTAGE OF UTERUS    . ESOPHAGEAL DILATION N/A 07/31/2015   Procedure: ESOPHAGEAL DILATION WIRE GUIDED WITH SAVORY DILATORS 15MM, 16MM;  Surgeon: Jacqueline Binder, MD;  Location: [facility];  Service: Endoscopy;  Laterality: N/A;  . ESOPHAGOGASTRODUODENOSCOPY  11/2008   A 9-mm submucosal lesion seen in the cardia., gastritis, no hpylori  . ESOPHAGOGASTRODUODENOSCOPY (EGD) WITH  PROPOFOL N/A 07/31/2015   [id number] ulcer and moderate gastritis, dysphagia, empirical dilation with no identified source. esophageal, gastric, duodenal bx unremarkable.   . ESOPHAGOGASTRODUODENOSCOPY (EGD) WITH PROPOFOL N/A 11/13/2015   SLF: 1. Gastric ulcer has healed. 2. single non-bleeding gastric AVMs 3. mild non-erosive gastritis.   Marland Kitchen ESOPHAGOGASTRODUODENOSCOPY (EGD) WITH PROPOFOL N/A 10/20/2017   Procedure: ESOPHAGOGASTRODUODENOSCOPY (EGD) WITH PROPOFOL;  Surgeon: Jacqueline Binder, MD;  [facility];  Service: Endoscopy;  Laterality: N/A;  2:00pm  . EXCISION OF ATRIAL MYXOMA N/A 04/01/2018   Procedure: RESECTION OF LEFT ATRIAL MYXOMA;  Surgeon: Jacqueline Nakayama, MD;  Location: [facility];  Service: Open Heart Surgery;  Laterality: N/A;  . FOOT SURGERY    . GALLBLADDER SURGERY  2010  . LEFT HEART CATH AND CORONARY ANGIOGRAPHY N/A 03/29/2018   Procedure: LEFT HEART CATH AND CORONARY ANGIOGRAPHY;  Surgeon: Jacqueline Harp, MD;  Location: [facility];  Service: Cardiovascular;  Laterality: N/A;  . MITRAL VALVE REPAIR N/A 04/01/2018   Procedure: MITRAL VALVE REPAIR.;  Surgeon: Jacqueline Nakayama, MD;  Location: [facility];  Service: Open Heart Surgery;  Laterality: N/A;  Mitral valve annuloplasty   . 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  Marland Kitchen SAVORY DILATION  02/03/2012   [id number] in the distal esophagus/Mild gastritis  . TEE WITHOUT CARDIOVERSION N/A 04/01/2018   Procedure: TRANSESOPHAGEAL ECHOCARDIOGRAM (TEE);  Surgeon: Jacqueline Nakayama, MD;  Location: [facility];  Service: Open Heart Surgery;  Laterality: N/A;  . toe graft    . TONSILLECTOMY       Current Meds  Medication Sig  . acetaminophen (TYLENOL) 500 MG tablet Take 500-1,000 mg by mouth every 6 (six) hours as needed (for fever.).  Marland Kitchen aspirin EC 81 MG tablet  Take 1 tablet (81 mg total) by mouth daily.  Marland Kitchen atorvastatin (LIPITOR) 40 MG tablet Take 1 tablet (40 mg total) by mouth daily at 6 PM.  . cetirizine (ZYRTEC) 10 MG tablet Take 10 mg by mouth as needed.   . cyclobenzaprine (FLEXERIL) 10 MG tablet Take 10 mg by mouth 2 (two) times daily.   Marland Kitchen dexlansoprazole (DEXILANT) 60 MG capsule Take 1 capsule (60 mg total) by mouth daily.  . diphenhydrAMINE (BENADRYL) 25 mg capsule Take 25 mg by mouth every 6 (six) hours as needed (for allergies.).  Marland Kitchen FLUoxetine (PROZAC) 40 MG capsule Take 40 mg by mouth daily.   Marland Kitchen gabapentin (NEURONTIN) 300 MG capsule Take 2 capsules (600 mg total) by mouth 3 (three) times daily.  . ondansetron (ZOFRAN ODT) 4 MG disintegrating tablet Take 1 tablet (4 mg total) by mouth every 8 (eight) hours as needed for nausea or vomiting.  . SUBOXONE 8-2 MG FILM Place 1 tablet under the tongue 3 (three) times  daily as needed for pain.     Allergies:   Aspirin, Ketorolac tromethamine, Nsaids, Sulfonamide derivatives, Imitrex [sumatriptan base], and Promethazine hcl   Social History   Tobacco Use  . Smoking status: Current Every Day Smoker    Packs/day: 0.30    Years: 35.00    Pack years: 10.50    Types: Cigarettes  . Smokeless tobacco: Never Used  Substance Use Topics  . Alcohol use: Not Currently    Comment: rare  . Drug use: Yes    Frequency: 1.0 times per week    Types: Marijuana    Comment: occ marijuana     Family Hx: The patient's family history includes Arthritis in an other family member; COPD in an other family member; Cancer in an other family member; Colon cancer in her maternal aunt, mother, paternal uncle, and another family member; Heart attack in her father; Heart defect in an other family member; Heart failure in her father and mother; Hypertension in her father; Lung cancer in her maternal uncle; Throat cancer in her maternal uncle. There is no history of Anesthesia problems, Hypotension, Malignant hyperthermia,  or Pseudochol deficiency.  ROS:   Please see the history of present illness.     All other systems reviewed and are negative.   Prior CV studies:   The following studies were reviewed today:  Echocardiogram 05/20/2018:  - Left ventricle: The cavity size was normal. Wall thickness was   increased in a pattern of mild LVH. Systolic function was normal.   The estimated ejection fraction was in the range of 60% to 65%.   Wall motion was normal; there were no regional wall motion   abnormalities. Left ventricular diastolic function parameters   were normal. - Mitral valve: S/p mitral valve repair. - Left atrium: The atrium was mildly dilated. - Right ventricle: Systolic function was mildly reduced.  Labs/Other Tests and Data Reviewed:    EKG:  No ECG reviewed.  Recent Labs: No results found for requested labs within last 8760 hours.   Recent Lipid Panel Lab Results  Component Value Date/Time   CHOL 204 (H) 03/31/2018 02:33 AM   TRIG 55 03/31/2018 02:33 AM   HDL 60 03/31/2018 02:33 AM   CHOLHDL 3.4 03/31/2018 02:33 AM   LDLCALC 133 (H) 03/31/2018 02:33 AM    Wt Readings from Last 3 Encounters:  01/16/20 126 lb (57.2 kg)  08/03/19 132 lb 6.4 oz (60.1 kg)  12/24/18 120 lb (54.4 kg)     Objective:    Vital Signs:  Ht 5\' 2"  (1.575 m)   Wt 126 lb (57.2 kg)   BMI 23.05 kg/m    VITAL SIGNS:  reviewed  ASSESSMENT & PLAN:    1.  History of atrial myxoma: Status post resection. Stable.  2.  Status post mitral valve repair: Echocardiogram demonstrated durable valve repair and normal left ventricular systolic function by echocardiogram in May 2019.  Continue aspirin.  She will need SBE prophylaxis with dental work.  She does have a prior history of GI bleeding prior to partial gastrectomy and is on proton pump inhibitor therapy.  3.  Coronary artery disease: Mild nonobstructive disease in the proximal LAD (40%).  She is on atorvastatin.  4.  Tobacco abuse: She smokes 1  ppd.    COVID-19 Education: The signs and symptoms of COVID-19 were discussed with the patient and how to seek care for testing (follow up with PCP or arrange E-visit).  The importance of social distancing was  discussed today.  Time:   Today, I have spent 10 minutes with the patient with telehealth technology discussing the above problems.     Medication Adjustments/Labs and Tests Ordered: Current medicines are reviewed at length with the patient today.  Concerns regarding medicines are outlined above.   Tests Ordered: No orders of the defined types were placed in this encounter.   Medication Changes: No orders of the defined types were placed in this encounter.   Follow Up:  Virtual Visit  in 1 year(s)  Signed, Jacqueline Sable, MD  01/16/2020 10:35 AM    [place]

## 2020-01-17 ENCOUNTER — Telehealth: Payer: Self-pay | Admitting: Licensed Clinical Social Worker

## 2020-01-17 NOTE — Telephone Encounter (Signed)
CSW referred to assist patient with obtaining a BP cuff. CSW contacted patient to inform cuff will be delivered to home. Patient grateful for support and assistance. CSW available as needed. Jackie Tashana Haberl, LCSW, CCSW-MCS [phone]  

## 2020-03-07 NOTE — Patient Instructions (Addendum)
DUE TO COVID-19 ONLY ONE VISITOR IS ALLOWED TO COME WITH YOU AND STAY IN THE WAITING ROOM ONLY DURING PRE OP AND PROCEDURE DAY OF SURGERY. THE 1 VISITOR MAY VISIT WITH YOU AFTER SURGERY IN YOUR PRIVATE ROOM DURING VISITING HOURS ONLY!  YOU NEED TO HAVE A COVID 19 TEST ON: 03/16/20 @ 11:30 am ,THIS TEST MUST BE DONE BEFORE SURGERY, COME  [place], [place] , [address].  ([place]) ONCE YOUR COVID TEST IS COMPLETED, PLEASE BEGIN THE QUARANTINE INSTRUCTIONS AS OUTLINED IN YOUR HANDOUT.                Michela Pitcher    Your procedure is scheduled on: 03/20/20   Report to [facility] Main  Entrance   Report to admitting at: 8:00 AM     Call this number if you have problems the morning of surgery [phone]    Remember:   [place], NO [place].     Take these medicines the morning of surgery with A SIP OF WATER: cetirizine,fluoxetine,gabapentin,dexlansoprazole.                                 You may not have any metal on your body including hair pins and              piercings  Do not wear jewelry, make-up, lotions, powders or perfumes, deodorant             Do not wear nail polish on your fingernails.  Do not shave  48 hours prior to surgery.                Do not bring valuables to the hospital. [place].  Contacts, dentures or bridgework may not be worn into surgery.  Leave suitcase in the car. After surgery it may be brought to your room.     Patients discharged the day of surgery will not be allowed to drive home. IF YOU ARE HAVING SURGERY AND GOING HOME THE SAME DAY, YOU MUST HAVE AN ADULT TO DRIVE YOU HOME AND BE WITH YOU FOR 24 HOURS. YOU MAY GO HOME BY TAXI OR UBER OR ORTHERWISE, BUT AN ADULT MUST ACCOMPANY YOU HOME AND STAY WITH YOU FOR 24 HOURS.  Name and phone number of your driver:  Special Instructions: N/A          Please read over the following fact sheets you were given: _____________________________________________________________________   NO SOLID FOOD AFTER MIDNIGHT THE NIGHT PRIOR TO SURGERY. NOTHING BY MOUTH EXCEPT CLEAR LIQUIDS UNTIL: 7:00 am . PLEASE FINISH ENSURE DRINK PER SURGEON ORDER  WHICH NEEDS TO BE COMPLETED AT : 7:00 am.   CLEAR LIQUID DIET   Foods Allowed                                                                     Foods Excluded  Coffee and tea, regular and decaf  liquids that you cannot  Plain Jell-O any favor except red or purple                                           see through such as: Fruit ices (not with fruit pulp)                                     milk, soups, orange juice  Iced Popsicles                                    All solid food Carbonated beverages, regular and diet                                    Cranberry, grape and apple juices Sports drinks like Gatorade Lightly seasoned clear broth or consume(fat free) Sugar, honey syrup  Sample Menu Breakfast                                Lunch                                     Supper Cranberry juice                    Beef broth                            Chicken broth Jell-O                                     Grape juice                           Apple juice Coffee or tea                        Jell-O                                      Popsicle                                                Coffee or tea                        Coffee or tea  _____________________________________________________________________ [facility]- Preparing for Total Shoulder Arthroplasty    Before surgery, you can play an important role. Because skin is not sterile, your skin needs to be as free of germs as possible. You can reduce the number of germs on your skin by using the following products. . Benzoyl Peroxide Gel o Reduces the number of germs present  on the skin o Applied  twice a day to shoulder area starting two days before surgery    ==================================================================  Please follow these instructions carefully:  BENZOYL PEROXIDE 5% GEL  Please do not use if you have an allergy to benzoyl peroxide.   If your skin becomes reddened/irritated stop using the benzoyl peroxide.  Starting two days before surgery, apply as follows: 1. Apply benzoyl peroxide in the morning and at night. Apply after taking a shower. If you are not taking a shower clean entire shoulder front, back, and side along with the armpit with a clean wet washcloth.  2. Place a quarter-sized dollop on your shoulder and rub in thoroughly, making sure to cover the front, back, and side of your shoulder, along with the armpit.   2 days before ____ AM   ____ PM              1 day before ____ AM   ____ PM                         3. Do this twice a day for two days.  (Last application is the night before surgery, AFTER using the CHG soap as described below).  4. Do NOT apply benzoyl peroxide gel on the day of surgery. [place]- Preparing for Total Shoulder Arthroplasty    Before surgery, you can play an important role. Because skin is not sterile, your skin needs to be as free of germs as possible. You can reduce the number of germs on your skin by using the following products. . Benzoyl Peroxide Gel o Reduces the number of germs present on the skin o Applied twice a day to shoulder area starting two days before surgery    ==================================================================  Please follow these instructions carefully:  BENZOYL PEROXIDE 5% GEL  Please do not use if you have an allergy to benzoyl peroxide.   If your skin becomes reddened/irritated stop using the benzoyl peroxide.  Starting two days before surgery, apply as follows: 5. Apply benzoyl peroxide in the morning and at night. Apply after taking a shower. If you are not taking a  shower clean entire shoulder front, back, and side along with the armpit with a clean wet washcloth.  6. Place a quarter-sized dollop on your shoulder and rub in thoroughly, making sure to cover the front, back, and side of your shoulder, along with the armpit.   2 days before ____ AM   ____ PM              1 day before ____ AM   ____ PM                         7. Do this twice a day for two days.  (Last application is the night before surgery, AFTER using the CHG soap as described below).  8. Do NOT apply benzoyl peroxide gel on the day of surgery.     [place] - Preparing for Surgery Before surgery, you can play an important role.  Because skin is not sterile, your skin needs to be as free of germs as possible.  You can reduce the number of germs on your skin by washing with CHG (chlorahexidine gluconate) soap before surgery.  CHG is an antiseptic cleaner which kills germs and bonds with the skin to continue killing germs even after washing. Please DO NOT use if you have an  allergy to CHG or antibacterial soaps.  If your skin becomes reddened/irritated stop using the CHG and inform your nurse when you arrive at Short Stay. Do not shave (including legs and underarms) for at least 48 hours prior to the first CHG shower.  You may shave your face/neck. Please follow these instructions carefully:  1.  Shower with CHG Soap the night before surgery and the  morning of Surgery.  2.  If you choose to wash your hair, wash your hair first as usual with your  normal  shampoo.  3.  After you shampoo, rinse your hair and body thoroughly to remove the  shampoo.                           4.  Use CHG as you would any other liquid soap.  You can apply chg directly  to the skin and wash                       Gently with a scrungie or clean washcloth.  5.  Apply the CHG Soap to your body ONLY FROM THE NECK DOWN.   Do not use on face/ open                           Wound or open sores. Avoid contact with  eyes, ears mouth and genitals (private parts).                       Wash face,  Genitals (private parts) with your normal soap.             6.  Wash thoroughly, paying special attention to the area where your surgery  will be performed.  7.  Thoroughly rinse your body with warm water from the neck down.  8.  DO NOT shower/wash with your normal soap after using and rinsing off  the CHG Soap.                9.  Pat yourself dry with a clean towel.            10.  Wear clean pajamas.            11.  Place clean sheets on your bed the night of your first shower and do not  sleep with pets. Day of Surgery : Do not apply any lotions/deodorants the morning of surgery.  Please wear clean clothes to the hospital/surgery center.  FAILURE TO FOLLOW THESE INSTRUCTIONS MAY RESULT IN THE CANCELLATION OF YOUR SURGERY PATIENT SIGNATURE_________________________________  NURSE SIGNATURE__________________________________  ________________________________________________________________________   Adam Phenix  An incentive spirometer is a tool that can help keep your lungs clear and active. This tool measures how well you are filling your lungs with each breath. Taking long deep breaths may help reverse or decrease the chance of developing breathing (pulmonary) problems (especially infection) following:  A long period of time when you are unable to move or be active. BEFORE THE PROCEDURE   If the spirometer includes an indicator to show your best effort, your nurse or respiratory therapist will set it to a desired goal.  If possible, sit up straight or lean slightly forward. Try not to slouch.  Hold the incentive spirometer in an upright position. INSTRUCTIONS FOR USE  1. Sit on the edge of your bed if possible, or sit up as far as  you can in bed or on a chair. 2. Hold the incentive spirometer in an upright position. 3. Breathe out normally. 4. Place the mouthpiece in your mouth and seal your  lips tightly around it. 5. Breathe in slowly and as deeply as possible, raising the piston or the ball toward the top of the column. 6. Hold your breath for 3-5 seconds or for as long as possible. Allow the piston or ball to fall to the bottom of the column. 7. Remove the mouthpiece from your mouth and breathe out normally. 8. Rest for a few seconds and repeat Steps 1 through 7 at least 10 times every 1-2 hours when you are awake. Take your time and take a few normal breaths between deep breaths. 9. The spirometer may include an indicator to show your best effort. Use the indicator as a goal to work toward during each repetition. 10. After each set of 10 deep breaths, practice coughing to be sure your lungs are clear. If you have an incision (the cut made at the time of surgery), support your incision when coughing by placing a pillow or rolled up towels firmly against it. Once you are able to get out of bed, walk around indoors and cough well. You may stop using the incentive spirometer when instructed by your caregiver.  RISKS AND COMPLICATIONS  Take your time so you do not get dizzy or light-headed.  If you are in pain, you may need to take or ask for pain medication before doing incentive spirometry. It is harder to take a deep breath if you are having pain. AFTER USE  Rest and breathe slowly and easily.  It can be helpful to keep track of a log of your progress. Your caregiver can provide you with a simple table to help with this. If you are using the spirometer at home, follow these instructions: [place] IF:   You are having difficultly using the spirometer.  You have trouble using the spirometer as often as instructed.  Your pain medication is not giving enough relief while using the spirometer.  You develop fever of 100.5 F (38.1 C) or higher. SEEK IMMEDIATE MEDICAL CARE IF:   You cough up bloody sputum that had not been present before.  You develop fever of 102 F  (38.9 C) or greater.  You develop worsening pain at or near the incision site. MAKE SURE YOU:   Understand these instructions.  Will watch your condition.  Will get help right away if you are not doing well or get worse. Document Released: 04/20/2007 Document Revised: 03/01/2012 Document Reviewed: 06/21/2007 [facility] Patient Information 2014 [place], [place].   ________________________________________________________________________

## 2020-03-08 ENCOUNTER — Encounter (HOSPITAL_COMMUNITY)
Admission: RE | Admit: 2020-03-08 | Discharge: 2020-03-08 | Disposition: A | Discharge status: Home / Self Care | Payer: Medicare Other | Source: Ambulatory Visit | Attending: Orthopedic Surgery | Admitting: Orthopedic Surgery

## 2020-03-08 ENCOUNTER — Encounter (HOSPITAL_COMMUNITY): Payer: Self-pay

## 2020-03-08 ENCOUNTER — Encounter (HOSPITAL_COMMUNITY): Admission: RE | Admit: 2020-03-08 | Payer: Medicare Other | Source: Ambulatory Visit

## 2020-03-08 ENCOUNTER — Other Ambulatory Visit: Payer: Self-pay

## 2020-03-08 DIAGNOSIS — Z01818 Encounter for other preprocedural examination: Secondary | ICD-10-CM | POA: Diagnosis not present

## 2020-03-08 HISTORY — DX: Pneumonia, unspecified organism: J18.9

## 2020-03-08 HISTORY — DX: Anemia, unspecified: D64.9

## 2020-03-08 HISTORY — DX: Unspecified osteoarthritis, unspecified site: M19.90

## 2020-03-08 NOTE — Progress Notes (Signed)
PCP - Dr. Sherrie Sport. LOV: 08/03/19 Cardiologist - Dr. Bronson Ing. LOV: 01/16/20. Clearance: 01/31/20:chart  Chest x-ray -  EKG -  Stress Test -  ECHO -  Cardiac Cath -   Sleep Study -  CPAP -   Fasting Blood Sugar -  Checks Blood Sugar _____ times a day  Blood Thinner Instructions: Aspirin Instructions:will stop a week before surgery. Last Dose:  Anesthesia review: Hx. CAD,Atrial Myxoma.Pt. was told by RN that she should stop smoking and using marihuana before surgery,pt. Verbalized her understanding of the subject.  Patient denies shortness of breath, fever, cough and chest pain at PAT appointment   Patient verbalized understanding of instructions that were given to them at the PAT appointment. Patient was also instructed that they will need to review over the PAT instructions again at home before surgery.

## 2020-03-12 ENCOUNTER — Encounter (HOSPITAL_COMMUNITY): Admission: RE | Admit: 2020-03-12 | Payer: Medicare Other | Source: Ambulatory Visit

## 2020-03-16 ENCOUNTER — Other Ambulatory Visit (HOSPITAL_COMMUNITY): Payer: Medicare Other

## 2020-03-20 ENCOUNTER — Inpatient Hospital Stay (HOSPITAL_COMMUNITY): Admission: RE | Admit: 2020-03-20 | Payer: Medicare Other | Source: Ambulatory Visit | Admitting: Orthopedic Surgery

## 2020-03-20 ENCOUNTER — Encounter (HOSPITAL_COMMUNITY): Admission: RE | Payer: Self-pay | Source: Ambulatory Visit

## 2020-03-20 SURGERY — ARTHROPLASTY, SHOULDER, TOTAL
Anesthesia: Choice | Site: Shoulder | Laterality: Left

## 2020-04-23 NOTE — Patient Instructions (Signed)
DUE TO COVID-19 ONLY ONE VISITOR IS ALLOWED TO COME WITH YOU AND STAY IN THE WAITING ROOM ONLY DURING PRE OP AND PROCEDURE DAY OF SURGERY. THE 1 VISITOR MAY VISIT WITH YOU AFTER SURGERY IN YOUR PRIVATE ROOM DURING VISITING HOURS ONLY!  YOU NEED TO HAVE A COVID 19 TEST ON: 04/27/20 @       , THIS TEST MUST BE DONE BEFORE SURGERY, COME  [place], [place] , [address].  ([place]) ONCE YOUR COVID TEST IS COMPLETED, PLEASE BEGIN THE QUARANTINE INSTRUCTIONS AS OUTLINED IN YOUR HANDOUT.                Jacqueline [name]    Your procedure is scheduled on:    Report to [facility] Main  Entrance   Report to short stay at: 5:30 AM     Call this number if you have problems the morning of surgery [phone]    Remember:   NO SOLID FOOD AFTER MIDNIGHT THE NIGHT PRIOR TO SURGERY. NOTHING BY MOUTH EXCEPT CLEAR LIQUIDS UNTIL: 4:30 am . PLEASE FINISH ENSURE DRINK PER SURGEON ORDER  WHICH NEEDS TO BE COMPLETED AT: 4:30 am .   CLEAR LIQUID DIET   Foods Allowed                                                                     Foods Excluded  Coffee and tea, regular and decaf                             liquids that you cannot  Plain Jell-O any favor except red or purple                                           see through such as: Fruit ices (not with fruit pulp)                                     milk, soups, orange juice  Iced Popsicles                                    All solid food Carbonated beverages, regular and diet                                    Cranberry, grape and apple juices Sports drinks like Gatorade Lightly seasoned clear broth or consume(fat free) Sugar, honey syrup  Sample Menu Breakfast                                Lunch                                     Supper Cranberry juice  Beef broth                            Chicken broth Jell-O                                     Grape juice                            Apple juice Coffee or tea                        Jell-O                                      Popsicle                                                Coffee or tea                        Coffee or tea  _____________________________________________________________________   BRUSH YOUR TEETH MORNING OF SURGERY AND RINSE YOUR MOUTH OUT, NO CHEWING GUM CANDY OR MINTS.     Take these medicines the morning of surgery with A SIP OF WATER: fluoxetine,gabapentin,dexilant.(dexlansoprazole)                                 You may not have any metal on your body including hair pins and              piercings  Do not wear jewelry, make-up, lotions, powders or perfumes, deodorant             Do not wear nail polish on your fingernails.  Do not shave  48 hours prior to surgery.                Do not bring valuables to the hospital. [place].  Contacts, dentures or bridgework may not be worn into surgery.  Leave suitcase in the car. After surgery it may be brought to your room.     Patients discharged the day of surgery will not be allowed to drive home. IF YOU ARE HAVING SURGERY AND GOING HOME THE SAME DAY, YOU MUST HAVE AN ADULT TO DRIVE YOU HOME AND BE WITH YOU FOR 24 HOURS. YOU MAY GO HOME BY TAXI OR UBER OR ORTHERWISE, BUT AN ADULT MUST ACCOMPANY YOU HOME AND STAY WITH YOU FOR 24 HOURS.  Name and phone number of your driver:  Special Instructions: N/A              Please read over the following fact sheets you were given: _____________________________________________________________________             [facility]- Preparing for Total Shoulder Arthroplasty    Before surgery, you can play an important role. Because skin is not sterile, your skin needs to be as free of germs as possible. You can reduce  the number of germs on your skin by using the following products. . Benzoyl Peroxide Gel o Reduces the number of germs present on the  skin o Applied twice a day to shoulder area starting two days before surgery    ==================================================================  Please follow these instructions carefully:  BENZOYL PEROXIDE 5% GEL  Please do not use if you have an allergy to benzoyl peroxide.   If your skin becomes reddened/irritated stop using the benzoyl peroxide.  Starting two days before surgery, apply as follows: 1. Apply benzoyl peroxide in the morning and at night. Apply after taking a shower. If you are not taking a shower clean entire shoulder front, back, and side along with the armpit with a clean wet washcloth.  2. Place a quarter-sized dollop on your shoulder and rub in thoroughly, making sure to cover the front, back, and side of your shoulder, along with the armpit.   2 days before ____ AM   ____ PM              1 day before ____ AM   ____ PM                         3. Do this twice a day for two days.  (Last application is the night before surgery, AFTER using the CHG soap as described below).  4. Do NOT apply benzoyl peroxide gel on the day of surgery.  [place] - Preparing for Surgery Before surgery, you can play an important role.  Because skin is not sterile, your skin needs to be as free of germs as possible.  You can reduce the number of germs on your skin by washing with CHG (chlorahexidine gluconate) soap before surgery.  CHG is an antiseptic cleaner which kills germs and bonds with the skin to continue killing germs even after washing. Please DO NOT use if you have an allergy to CHG or antibacterial soaps.  If your skin becomes reddened/irritated stop using the CHG and inform your nurse when you arrive at Short Stay. Do not shave (including legs and underarms) for at least 48 hours prior to the first CHG shower.  You may shave your face/neck. Please follow these instructions carefully:  1.  Shower with CHG Soap the night before surgery and the  morning of Surgery.  2.  If  you choose to wash your hair, wash your hair first as usual with your  normal  shampoo.  3.  After you shampoo, rinse your hair and body thoroughly to remove the  shampoo.                           4.  Use CHG as you would any other liquid soap.  You can apply chg directly  to the skin and wash                       Gently with a scrungie or clean washcloth.  5.  Apply the CHG Soap to your body ONLY FROM THE NECK DOWN.   Do not use on face/ open                           Wound or open sores. Avoid contact with eyes, ears mouth and genitals (private parts).  Wash face,  Genitals (private parts) with your normal soap.             6.  Wash thoroughly, paying special attention to the area where your surgery  will be performed.  7.  Thoroughly rinse your body with warm water from the neck down.  8.  DO NOT shower/wash with your normal soap after using and rinsing off  the CHG Soap.                9.  Pat yourself dry with a clean towel.            10.  Wear clean pajamas.            11.  Place clean sheets on your bed the night of your first shower and do not  sleep with pets. Day of Surgery : Do not apply any lotions/deodorants the morning of surgery.  Please wear clean clothes to the hospital/surgery center.  FAILURE TO FOLLOW THESE INSTRUCTIONS MAY RESULT IN THE CANCELLATION OF YOUR SURGERY PATIENT SIGNATURE_________________________________  NURSE SIGNATURE__________________________________  ________________________________________________________________________   Jacqueline [name]  An incentive spirometer is a tool that can help keep your lungs clear and active. This tool measures how well you are filling your lungs with each breath. Taking long deep breaths may help reverse or decrease the chance of developing breathing (pulmonary) problems (especially infection) following:  A long period of time when you are unable to move or be active. BEFORE THE PROCEDURE    If the spirometer includes an indicator to show your best effort, your nurse or respiratory therapist will set it to a desired goal.  If possible, sit up straight or lean slightly forward. Try not to slouch.  Hold the incentive spirometer in an upright position. INSTRUCTIONS FOR USE  1. Sit on the edge of your bed if possible, or sit up as far as you can in bed or on a chair. 2. Hold the incentive spirometer in an upright position. 3. Breathe out normally. 4. Place the mouthpiece in your mouth and seal your lips tightly around it. 5. Breathe in slowly and as deeply as possible, raising the piston or the ball toward the top of the column. 6. Hold your breath for 3-5 seconds or for as long as possible. Allow the piston or ball to fall to the bottom of the column. 7. Remove the mouthpiece from your mouth and breathe out normally. 8. Rest for a few seconds and repeat Steps 1 through 7 at least 10 times every 1-2 hours when you are awake. Take your time and take a few normal breaths between deep breaths. 9. The spirometer may include an indicator to show your best effort. Use the indicator as a goal to work toward during each repetition. 10. After each set of 10 deep breaths, practice coughing to be sure your lungs are clear. If you have an incision (the cut made at the time of surgery), support your incision when coughing by placing a pillow or rolled up towels firmly against it. Once you are able to get out of bed, walk around indoors and cough well. You may stop using the incentive spirometer when instructed by your caregiver.  RISKS AND COMPLICATIONS  Take your time so you do not get dizzy or light-headed.  If you are in pain, you may need to take or ask for pain medication before doing incentive spirometry. It is harder to take a deep breath if you are having pain.  AFTER USE  Rest and breathe slowly and easily.  It can be helpful to keep track of a log of your progress. Your caregiver  can provide you with a simple table to help with this. If you are using the spirometer at home, follow these instructions: [place] IF:   You are having difficultly using the spirometer.  You have trouble using the spirometer as often as instructed.  Your pain medication is not giving enough relief while using the spirometer.  You develop fever of 100.5 F (38.1 C) or higher. SEEK IMMEDIATE MEDICAL CARE IF:   You cough up bloody sputum that had not been present before.  You develop fever of 102 F (38.9 C) or greater.  You develop worsening pain at or near the incision site. MAKE SURE YOU:   Understand these instructions.  Will watch your condition.  Will get help right away if you are not doing well or get worse. Document Released: 04/20/2007 Document Revised: 03/01/2012 Document Reviewed: 06/21/2007 [facility] Patient Information 2014 [place], [place].   ________________________________________________________________________

## 2020-04-24 ENCOUNTER — Encounter (HOSPITAL_COMMUNITY)
Admission: RE | Admit: 2020-04-24 | Discharge: 2020-04-24 | Disposition: A | Discharge status: Home / Self Care | Payer: Medicare Other | Source: Ambulatory Visit | Attending: Orthopedic Surgery | Admitting: Orthopedic Surgery

## 2020-04-24 ENCOUNTER — Encounter (HOSPITAL_COMMUNITY): Admission: RE | Admit: 2020-04-24 | Payer: Medicare Other | Source: Ambulatory Visit

## 2020-05-01 ENCOUNTER — Ambulatory Visit (HOSPITAL_COMMUNITY): Admission: RE | Admit: 2020-05-01 | Payer: Medicare Other | Source: Home / Self Care | Admitting: Orthopedic Surgery

## 2020-05-01 ENCOUNTER — Encounter (HOSPITAL_COMMUNITY): Admission: RE | Payer: Self-pay | Source: Home / Self Care

## 2020-05-01 SURGERY — ARTHROPLASTY, SHOULDER, TOTAL
Anesthesia: Choice | Site: Shoulder | Laterality: Left

## 2020-06-08 ENCOUNTER — Encounter (HOSPITAL_COMMUNITY): Payer: Medicare HMO

## 2020-06-08 ENCOUNTER — Encounter (HOSPITAL_COMMUNITY): Admission: RE | Admit: 2020-06-08 | Payer: Medicare HMO | Source: Ambulatory Visit

## 2020-06-12 DIAGNOSIS — R69 Illness, unspecified: Secondary | ICD-10-CM | POA: Diagnosis not present

## 2020-06-19 ENCOUNTER — Encounter (HOSPITAL_COMMUNITY): Admission: RE | Payer: Self-pay | Source: Home / Self Care

## 2020-06-19 ENCOUNTER — Ambulatory Visit (HOSPITAL_COMMUNITY): Admission: RE | Admit: 2020-06-19 | Payer: Medicare HMO | Source: Home / Self Care | Admitting: Orthopedic Surgery

## 2020-06-19 SURGERY — ARTHROPLASTY, SHOULDER, TOTAL
Anesthesia: Choice | Site: Shoulder | Laterality: Left

## 2020-06-28 DIAGNOSIS — R69 Illness, unspecified: Secondary | ICD-10-CM | POA: Diagnosis not present

## 2020-06-28 DIAGNOSIS — F1290 Cannabis use, unspecified, uncomplicated: Secondary | ICD-10-CM | POA: Diagnosis not present

## 2020-06-28 DIAGNOSIS — Z720 Tobacco use: Secondary | ICD-10-CM | POA: Diagnosis not present

## 2020-07-09 DIAGNOSIS — J0191 Acute recurrent sinusitis, unspecified: Secondary | ICD-10-CM | POA: Diagnosis not present

## 2020-07-13 DIAGNOSIS — R69 Illness, unspecified: Secondary | ICD-10-CM | POA: Diagnosis not present

## 2020-07-19 IMAGING — DX DG CHEST 2V
2 series · 2 of 2 positions shown · non-contrast
Comparison: 05/04/2018.

CLINICAL DATA: Productive cough.

EXAM:
CHEST - 2 VIEW

[chest pa]
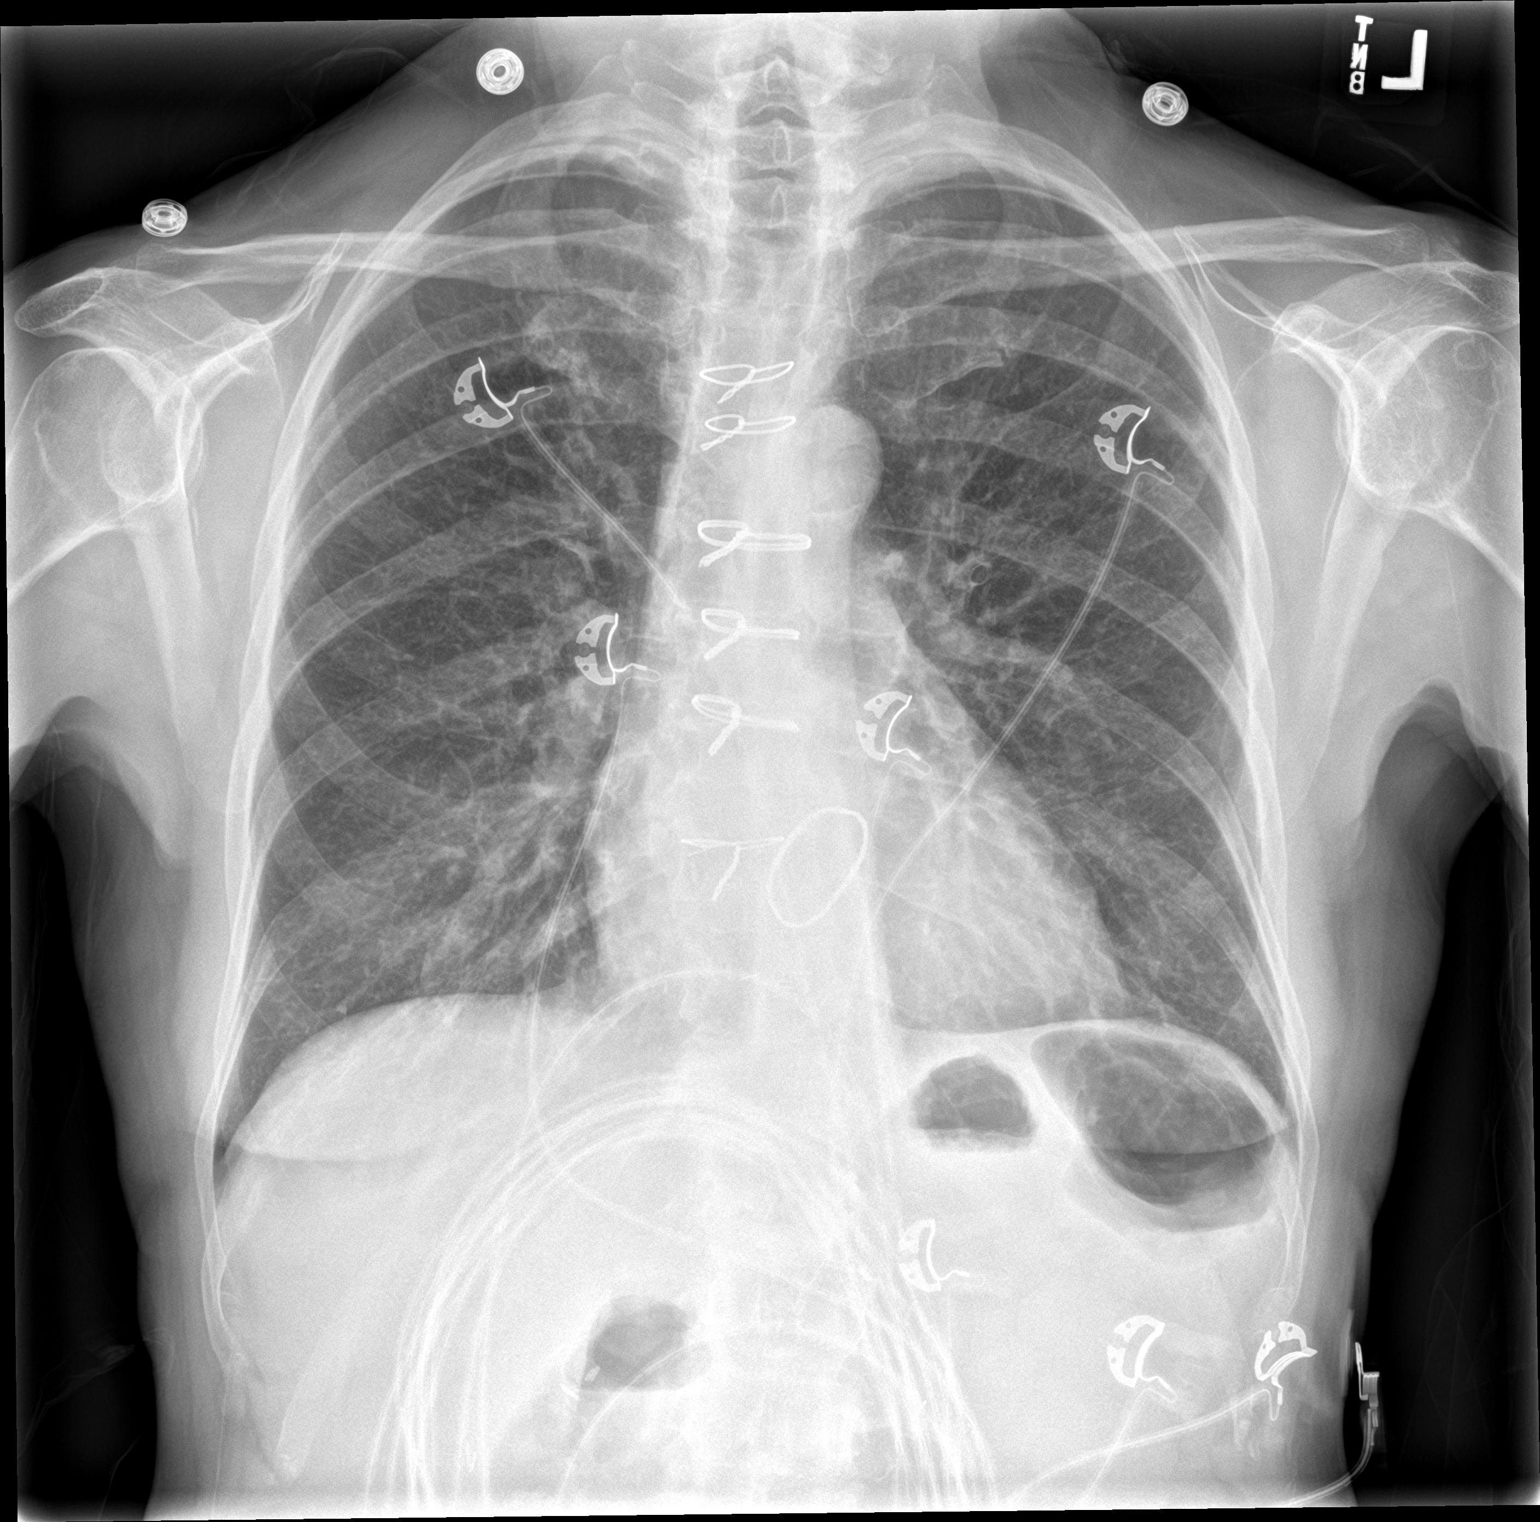

[chest lat]
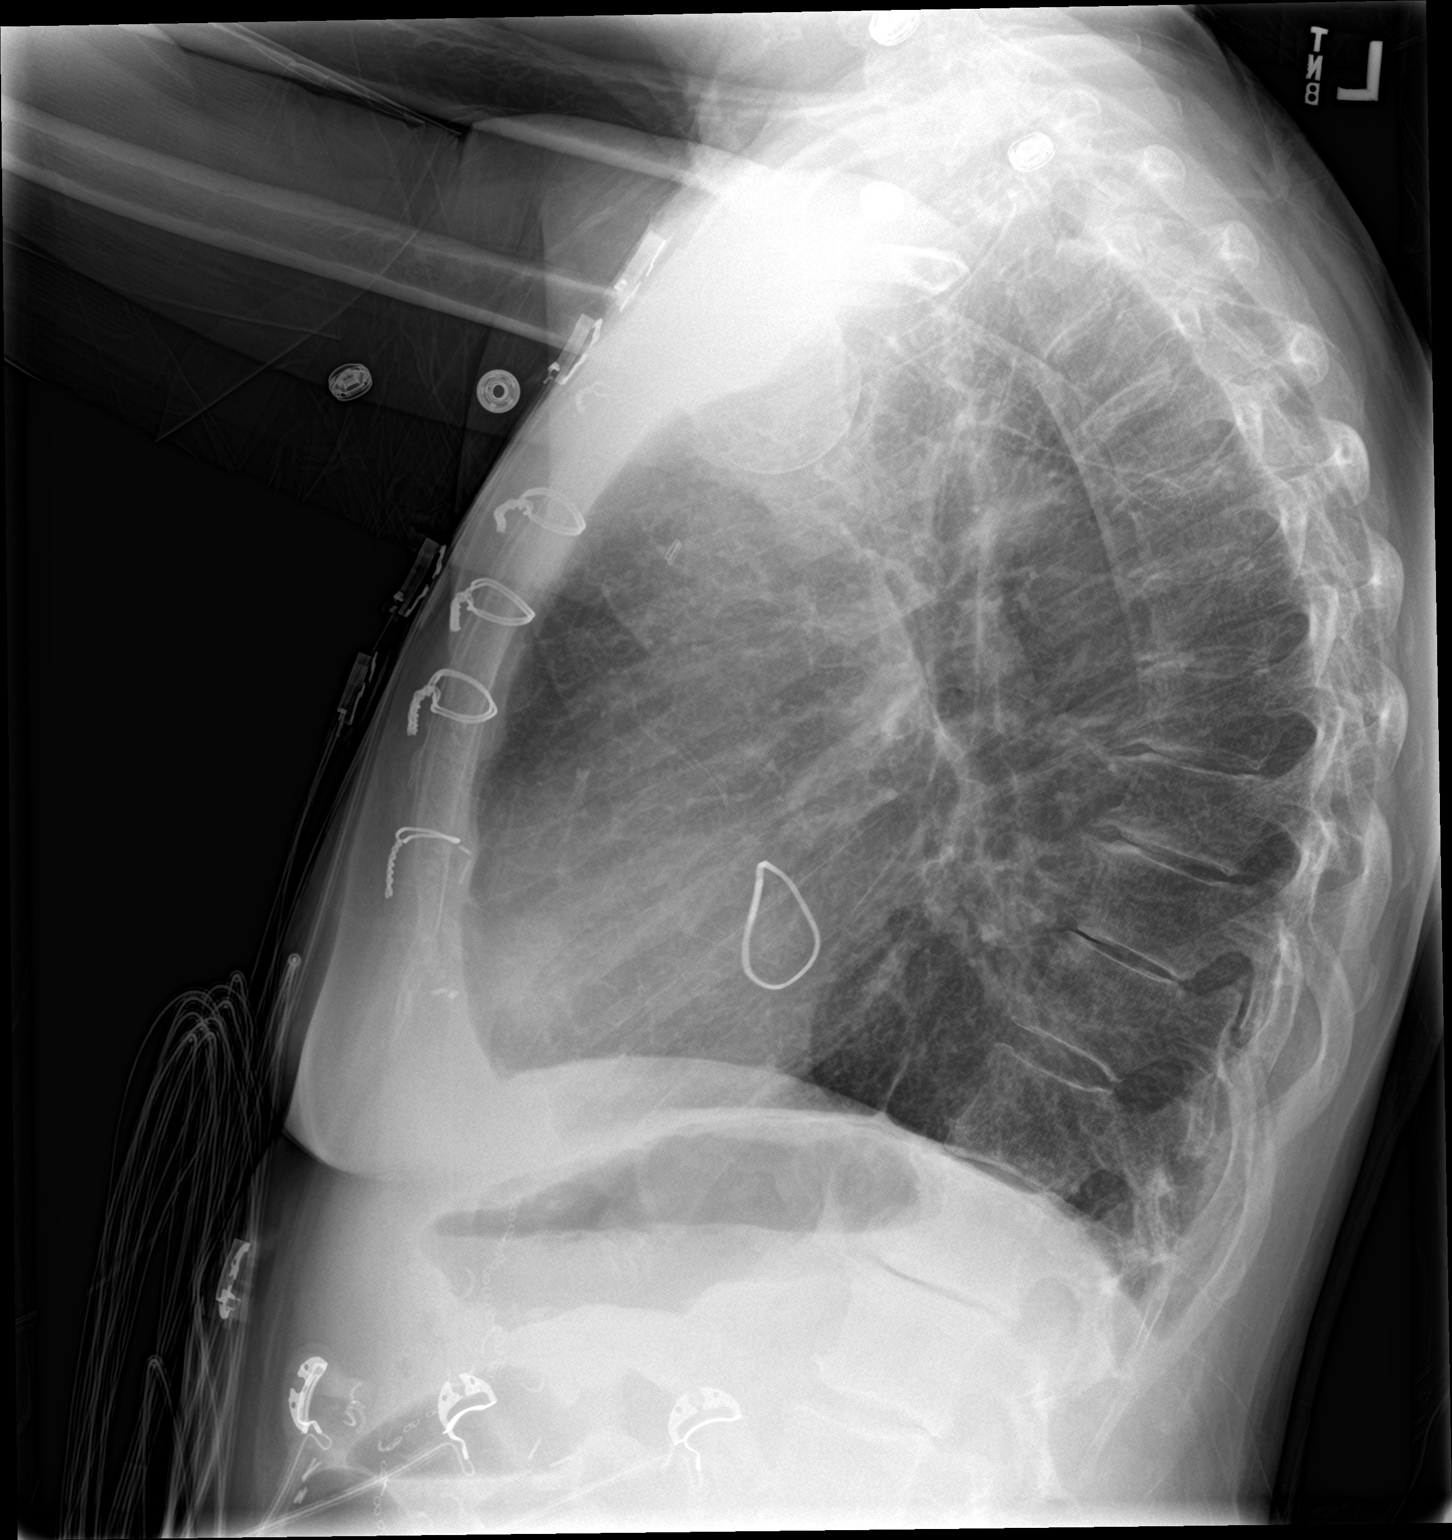

[2 of 2 positions shown; findings below may reference images not displayed]

FINDINGS: Stable changes from prior cardiac surgery and mitral valve
replacement. Cardiac silhouette is normal in size. No mediastinal or
hilar masses. No evidence of adenopathy.

Prominent bronchovascular markings similar to the prior study. Lungs
are hyperexpanded. No evidence of pneumonia or pulmonary edema. No
pleural effusion or pneumothorax.

Skeletal structures are demineralized but intact.
IMPRESSION: No acute cardiopulmonary disease.

## 2020-08-10 DIAGNOSIS — R69 Illness, unspecified: Secondary | ICD-10-CM | POA: Diagnosis not present

## 2020-08-11 ENCOUNTER — Emergency Department (HOSPITAL_COMMUNITY)
Admission: EM | Admit: 2020-08-11 | Discharge: 2020-08-12 | Disposition: A | Discharge status: Home / Self Care | Payer: Medicare HMO | Attending: Emergency Medicine | Admitting: Emergency Medicine

## 2020-08-11 ENCOUNTER — Other Ambulatory Visit: Payer: Self-pay

## 2020-08-11 DIAGNOSIS — I10 Essential (primary) hypertension: Secondary | ICD-10-CM | POA: Insufficient documentation

## 2020-08-11 DIAGNOSIS — Y92000 Kitchen of unspecified non-institutional (private) residence as  the place of occurrence of the external cause: Secondary | ICD-10-CM | POA: Diagnosis not present

## 2020-08-11 DIAGNOSIS — F17210 Nicotine dependence, cigarettes, uncomplicated: Secondary | ICD-10-CM | POA: Diagnosis not present

## 2020-08-11 DIAGNOSIS — J449 Chronic obstructive pulmonary disease, unspecified: Secondary | ICD-10-CM | POA: Diagnosis not present

## 2020-08-11 DIAGNOSIS — W268XXA Contact with other sharp object(s), not elsewhere classified, initial encounter: Secondary | ICD-10-CM | POA: Insufficient documentation

## 2020-08-11 DIAGNOSIS — Z23 Encounter for immunization: Secondary | ICD-10-CM | POA: Diagnosis not present

## 2020-08-11 DIAGNOSIS — S51812A Laceration without foreign body of left forearm, initial encounter: Secondary | ICD-10-CM | POA: Diagnosis not present

## 2020-08-11 DIAGNOSIS — Z7982 Long term (current) use of aspirin: Secondary | ICD-10-CM | POA: Diagnosis not present

## 2020-08-11 DIAGNOSIS — Y999 Unspecified external cause status: Secondary | ICD-10-CM | POA: Diagnosis not present

## 2020-08-11 DIAGNOSIS — I2510 Atherosclerotic heart disease of native coronary artery without angina pectoris: Secondary | ICD-10-CM | POA: Diagnosis not present

## 2020-08-11 DIAGNOSIS — Y9389 Activity, other specified: Secondary | ICD-10-CM | POA: Insufficient documentation

## 2020-08-11 DIAGNOSIS — R69 Illness, unspecified: Secondary | ICD-10-CM | POA: Diagnosis not present

## 2020-08-11 NOTE — ED Triage Notes (Signed)
Pt arrives from home via POV c/o of laceration to anterior right forearm. Pt reports being in the kitchen and had a back spasm following a sneeze, and Pt lost balance and fell gouging herself on a vertical handle of a cupboard door under her sink. Bleeding controlled at this time.

## 2020-08-12 ENCOUNTER — Encounter (HOSPITAL_COMMUNITY): Payer: Self-pay

## 2020-08-12 ENCOUNTER — Other Ambulatory Visit: Payer: Self-pay

## 2020-08-12 DIAGNOSIS — S51812A Laceration without foreign body of left forearm, initial encounter: Secondary | ICD-10-CM | POA: Diagnosis not present

## 2020-08-12 MED ORDER — POVIDONE-IODINE 10 % EX SOLN
CUTANEOUS | Status: AC
  Administered 2020-08-12: 1
  Filled 2020-08-12: qty 15

## 2020-08-12 MED ORDER — LIDOCAINE-EPINEPHRINE (PF) 1 %-1:200000 IJ SOLN
10.0000 mL | Freq: Once | INTRAMUSCULAR | Status: AC
  Administered 2020-08-12: 10 mL
  Filled 2020-08-12: qty 30

## 2020-08-12 MED ORDER — TETANUS-DIPHTH-ACELL PERTUSSIS 5-2.5-18.5 LF-MCG/0.5 IM SUSP
0.5000 mL | Freq: Once | INTRAMUSCULAR | Status: AC
  Administered 2020-08-12: 0.5 mL via INTRAMUSCULAR
  Filled 2020-08-12: qty 0.5

## 2020-08-12 NOTE — ED Notes (Signed)
ED Provider at bedside. 

## 2020-08-12 NOTE — ED Provider Notes (Signed)
[facility] EMERGENCY DEPARTMENT Provider Note   CSN: [id number] Arrival date & time: 08/11/20  2257   Time seen 12:10 AM  History Chief Complaint  Patient presents with  . Laceration    Jacqueline [name] is a 69 y.o. female.  HPI   Patient states she was in the kitchen washing dishes and she heard a song on the radio that she started dancing to and she squatted down and when she did a sharp edge of a handle lacerated her right forearm.  She states that happened just prior to arrival.  Patient is right-handed.  She states she has no idea when she had a tetanus last.  PCP Sherrie Sport Samul Dada, MD   Past Medical History:  Diagnosis Date  . Anemia   . Anxiety   . Arthritis   . Atrial myxoma    Left atrial  . CAD (coronary artery disease) 04/21/2018   LHC 4/19: no sig CAD, pLAD 40  . Chronic back pain   . Drug-seeking behavior   . Gastric nodule 2009   EUS, ?leiomyoma  . 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 GERD (gastroesophageal reflux disease)   . Gout   . History of pancreatitis    Biliary and/or etoh?  . Irritable bowel syndrome   . Knee pain   . Migraines   . Migraines   . Peripheral neuropathy   . Pneumonia   . Polysubstance abuse (HCC)    opiates, cocaine, marijuana  . Status post mitral valve repair    Echo 5/19: Mild LVH, EF 60-65, normal wall motion, status post mitral valve repair (mean gradient 4), mild LAE, mildly reduced RVSF    Patient Active Problem List   Diagnosis Date Noted  . Heme + stool 08/18/2018  . Atrial myxoma   . CAD (coronary artery disease) 04/21/2018  . S/P mitral valve repair 04/02/2018  . COPD with chronic bronchitis ([place]) 03/29/2018  . History of atrial myxoma   . Chronic pain syndrome 03/27/2018  . Tobacco abuse 03/27/2018  . HTN (hypertension) 03/27/2018  . Constipation 10/06/2017  . GERD (gastroesophageal reflux disease) 09/26/2016  .  PUD (peptic ulcer disease) 10/01/2015  . Nausea with vomiting 10/01/2015  . Loss of weight 10/01/2015  . Dizzy 07/16/2015  . Major depressive disorder, recurrent, severe without psychotic features ([place])   . Substance induced mood disorder ([place]) 04/24/2015  . Opioid type dependence, continuous ([place]) 04/24/2015  . Severe major depression without psychotic features ([place]) 04/24/2015  . PTSD (post-traumatic stress disorder) 04/24/2015  . Chest pain, localized 04/28/2012  . Odynophagia 09/30/2011  . Colon cancer screening 09/30/2011  . Gastric tumor 09/30/2011  . CLOSED FRACTURE OF UPPER END OF FIBULA 07/31/2010  . Dysphagia, idiopathic 07/17/2009  . ABDOMINAL PAIN, GENERALIZED 07/17/2009  . GASTRITIS 07/12/2009  . IBS 07/12/2009  . RUQ PAIN 07/12/2009  . EPIGASTRIC PAIN 07/12/2009  . PANCREATITIS, ACUTE, HX OF 07/12/2009  . Osteoarthrosis, unspecified whether generalized or localized, hand 11/09/2008  . OSTEOARTHRITIS, LOWER LEG 10/25/2008  . DERANGEMENT MENISCUS 10/16/2008  . KNEE PAIN 10/16/2008    Past Surgical History:  Procedure Laterality Date  . ABDOMINAL HYSTERECTOMY    . ABDOMINAL SURGERY    . BACK SURGERY    . back surgery /ray cage fusion comberg, gso    . BIOPSY  07/31/2015   Procedure: BIOPSY (DUODENAL, GASTRIC, GASTRIC ULCER, ESOPHAGEAL);  Surgeon: Marga Melnick  Fields, MD;  Location: [facility];  Service: Endoscopy;;  . CESAREAN SECTION    . CHOLECYSTECTOMY    . COLONOSCOPY  2001   Dr. Laural Golden, hemorrhoids  . COLONOSCOPY  10/21/2011   [id number] polyp multiple/internal hemorrhoids  . DILATION AND CURETTAGE OF UTERUS    . ESOPHAGEAL DILATION N/A 07/31/2015   Procedure: ESOPHAGEAL DILATION WIRE GUIDED WITH SAVORY DILATORS 15MM, 16MM;  Surgeon: Danie Binder, MD;  Location: [facility];  Service: Endoscopy;  Laterality: N/A;  . ESOPHAGOGASTRODUODENOSCOPY  11/2008   A 9-mm submucosal lesion seen in the cardia., gastritis, no hpylori  . ESOPHAGOGASTRODUODENOSCOPY (EGD) WITH PROPOFOL  N/A 07/31/2015   [id number] ulcer and moderate gastritis, dysphagia, empirical dilation with no identified source. esophageal, gastric, duodenal bx unremarkable.   . ESOPHAGOGASTRODUODENOSCOPY (EGD) WITH PROPOFOL N/A 11/13/2015   SLF: 1. Gastric ulcer has healed. 2. single non-bleeding gastric AVMs 3. mild non-erosive gastritis.   Marland Kitchen ESOPHAGOGASTRODUODENOSCOPY (EGD) WITH PROPOFOL N/A 10/20/2017   Procedure: ESOPHAGOGASTRODUODENOSCOPY (EGD) WITH PROPOFOL;  Surgeon: Danie Binder, MD;  Location: [facility];  Service: Endoscopy;  Laterality: N/A;  2:00pm  . EXCISION OF ATRIAL MYXOMA N/A 04/01/2018   Procedure: RESECTION OF LEFT ATRIAL MYXOMA;  Surgeon: Melrose Nakayama, MD;  Location: [facility];  Service: Open Heart Surgery;  Laterality: N/A;  . FOOT SURGERY    . GALLBLADDER SURGERY  2010  . LEFT HEART CATH AND CORONARY ANGIOGRAPHY N/A 03/29/2018   Procedure: LEFT HEART CATH AND CORONARY ANGIOGRAPHY;  Surgeon: Lorretta Harp, MD;  Location: [facility];  Service: Cardiovascular;  Laterality: N/A;  . MITRAL VALVE REPAIR N/A 04/01/2018   Procedure: MITRAL VALVE REPAIR.;  Surgeon: Melrose Nakayama, MD;  Location: [facility];  Service: Open Heart Surgery;  Laterality: N/A;  Mitral valve annuloplasty   . 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  Marland Kitchen SAVORY DILATION  02/03/2012   [id number] in the distal esophagus/Mild gastritis  . TEE WITHOUT CARDIOVERSION N/A 04/01/2018   Procedure: TRANSESOPHAGEAL ECHOCARDIOGRAM (TEE);  Surgeon: Melrose Nakayama, MD;  Location: [facility];  Service: Open Heart Surgery;  Laterality: N/A;  . toe graft    . TONSILLECTOMY       OB History    Gravida  3   Para  2   Term  2   Preterm      AB  1   Living  2     SAB  1   TAB      Ectopic      Multiple      Live Births               Family History  Problem Relation Age of Onset  . Colon cancer Other   . Colon cancer Mother        diagnosed in late 2s and died age 14  . Heart failure Mother   . Heart defect Other        family history   . Arthritis Other        family history  . COPD Other        family history   . Cancer Other        multiple unknown type  . Heart attack Father        deceased at 54  . Hypertension Father   .  Heart failure Father   . Lung cancer Maternal Uncle   . Throat cancer Maternal Uncle   . Colon cancer Paternal Uncle   . Colon cancer Maternal Aunt   . Anesthesia problems Neg Hx   . Hypotension Neg Hx   . Malignant hyperthermia Neg Hx   . Pseudochol deficiency Neg Hx     Social History   Tobacco Use  . Smoking status: Current Every Day Smoker    Packs/day: 0.30    Years: 35.00    Pack years: 10.50    Types: Cigarettes  . Smokeless tobacco: Never Used  Vaping Use  . Vaping Use: Never used  Substance Use Topics  . Alcohol use: Not Currently    Comment: rare  . Drug use: Yes    Frequency: 1.0 times per week    Types: Marijuana    Comment: occ marijuana    Home Medications Prior to Admission medications   Medication Sig Start Date End Date Taking? Authorizing Provider  aspirin EC 81 MG tablet Take 1 tablet (81 mg total) by mouth daily. 07/22/18   Herminio Commons, MD  aspirin-acetaminophen-caffeine (EXCEDRIN MIGRAINE) [phone] MG tablet Take 2 tablets by mouth every 6 (six) hours as needed for headache.    [provider]  atorvastatin (LIPITOR) 40 MG tablet Take 1 tablet (40 mg total) by mouth daily at 6 PM. Patient taking differently: Take 40 mg by mouth daily.  06/07/18   Herminio Commons, MD  Carboxymethylcellul-Glycerin (LUBRICATING EYE DROPS OP) Place 1 drop into both eyes daily as needed (allergies/dry eyes).    [provider]  cyclobenzaprine (FLEXERIL) 10 MG tablet Take 10 mg by mouth 2 (two) times daily.  09/22/17   [provider]  dexlansoprazole (DEXILANT) 60 MG capsule Take 1 capsule (60 mg total) by mouth daily. 08/03/19   Erenest Rasher, PA-C  diphenhydrAMINE (BENADRYL) 25 MG tablet Take 25-50 mg by mouth daily as needed for allergies or sleep.    [provider]  doxycycline (VIBRA-TABS) 100 MG tablet Take 100 mg by mouth 2 (two) times daily. 05/25/20   [provider]  ferrous sulfate 325 (65 FE) MG EC tablet Take 325 mg by mouth 3 (three) times daily with meals.    [provider]  FLUoxetine (PROZAC) 40 MG capsule Take 40 mg by mouth daily.  09/22/17   [provider]  gabapentin (NEURONTIN) 300 MG capsule Take 2 capsules (600 mg total) by mouth 3 (three) times daily. 04/30/15   Niel Hummer, NP  ondansetron (ZOFRAN) 4 MG tablet Take 4 mg by mouth every 6 (six) hours as needed for nausea or vomiting.    [provider]  sodium chloride (OCEAN) 0.65 % SOLN nasal spray Place 1-2 sprays into both nostrils as needed for congestion.    [provider]  SUBOXONE 8-2 MG FILM Place 1 tablet under the tongue in the morning, at noon, and at bedtime.  04/20/18   [provider]    Allergies    Aspirin, Ketorolac tromethamine, Nsaids, Sulfonamide derivatives, Imitrex [sumatriptan base], and Promethazine hcl  Review of Systems   Review of Systems  All other systems reviewed and are negative.   Physical Exam Updated Vital Signs BP (!) 104/56 (BP Location: Left Arm)   Pulse 79   Temp 98 F (36.7 C) (Oral)   Resp 16   Ht 5\' 2"  (1.575 m)   Wt 51.7 kg   SpO2 98%   BMI  20.85 kg/m   Physical Exam Vitals and nursing note reviewed.  Constitutional:      Appearance: Normal appearance.     Comments: Underweight t female who is very hard to keep awake.  HENT:     Head: Normocephalic and atraumatic.     Right Ear: External ear normal.     Left Ear: External ear normal.  Eyes:     Extraocular Movements: Extraocular movements intact.      Conjunctiva/sclera: Conjunctivae normal.  Cardiovascular:     Rate and Rhythm: Normal rate.  Pulmonary:     Effort: Pulmonary effort is normal. No respiratory distress.  Musculoskeletal:        General: Normal range of motion.     Cervical back: Normal range of motion.     Comments: Patient has a 6 cm linear with a curved distal edge on the volar aspect on the proximal/mid radial aspect of her forearm.  She has good range of motion of her fingers and intact sensation.  When I open the wound and observe it I can see the laceration is through the subcutaneous fat but does not involve the muscle fascia and this was observed before moving her fingers.  She has intact distal pulses and capillary refill.  Neurological:     General: No focal deficit present.     Mental Status: She is alert and oriented to person, place, and time.     Cranial Nerves: No cranial nerve deficit.  Psychiatric:        Mood and Affect: Mood normal.        Behavior: Behavior normal.        Thought Content: Thought content normal.       ED Results / Procedures / Treatments   Labs (all labs ordered are listed, but only abnormal results are displayed) Labs Reviewed - No data to display  EKG None  Radiology No results found.  Procedures .Marland KitchenLaceration Repair  Date/Time: 08/12/2020 1:26 AM Performed by: Rolland Porter, MD Authorized by: Rolland Porter, MD   Consent:    Consent obtained:  Verbal   Consent given by:  Patient Anesthesia (see MAR for exact dosages):    Anesthesia method:  Local infiltration   Local anesthetic:  Lidocaine 1% WITH epi Laceration details:    Location:  Shoulder/arm   Shoulder/arm location:  R lower arm   Length (cm):  6   Laceration depth: through the subcutaneous fat. Repair type:    Repair type:  Intermediate Pre-procedure details:    Preparation:  Patient was prepped and draped in usual sterile fashion Exploration:    Hemostasis achieved with:  Direct pressure   Wound  exploration: wound explored through full range of motion and entire depth of wound probed and visualized     Wound extent: no fascia violation noted, no foreign bodies/material noted, no muscle damage noted, no nerve damage noted, no tendon damage noted, no underlying fracture noted and no vascular damage noted     Contaminated: no   Treatment:    Area cleansed with:  Betadine and saline   Amount of cleaning:  Standard Subcutaneous repair:    Suture size:  4-0   Suture material:  Vicryl   Number of sutures:  6 Skin repair:    Repair method:  Sutures   Suture size:  4-0   Suture material:  Nylon   Suture technique:  Simple interrupted   Number of sutures:  10 Approximation:    Approximation:  Loose Post-procedure  details:    Dressing:  Antibiotic ointment and non-adherent dressing   (including critical care time)  Medications Ordered in ED Medications  Tdap (BOOSTRIX) injection 0.5 mL (0.5 mLs Intramuscular Given 08/12/20 0022)  lidocaine-EPINEPHrine (XYLOCAINE-EPINEPHrine) 1 %-1:200000 (PF) injection 10 mL (10 mLs Infiltration Given 08/12/20 0021)  povidone-iodine (BETADINE) 10 % external solution (1 application  Given 7/40/81 0102)    ED Course  I have reviewed the triage vital signs and the nursing notes.  Pertinent labs & imaging results that were available during my care of the patient were reviewed by me and considered in my medical decision making (see chart for details).    MDM Rules/Calculators/A&P                          Patient was given a booster for tetanus.  Her wound was repaired.  Please note I had to wake up the patient multiple times before and during the procedure and she was asking me for pain medication.  When I review the [place] she last got buprenorphine/naloxone 8/2 mg sublingual on July 23 for 28-day supply.  I had offered her Tylenol for pain which she states she had at home.   Final Clinical Impression(s) / ED Diagnoses Final  diagnoses:  Laceration of left forearm, initial encounter    Rx / DC Orders ED Discharge Orders    None    OTC acetaminophen  Plan discharge  Rolland Porter, MD, Barbette Or, MD 08/12/20 0130

## 2020-08-12 NOTE — Discharge Instructions (Addendum)
Keep the wound clean and dry.  Do use triple antibiotic ointment on the wound to help prevent infection.  You can use an ice pack on the area for comfort.  You can take acetaminophen for pain, if needed.  The sutures should be removed in about 10 days at your doctor's office or at an urgent care.  You should have it rechecked sooner if it looks like it is getting infected, increasing redness, swelling, pain, drainage of pus or if you see a red streak running up your arm.

## 2020-08-22 ENCOUNTER — Ambulatory Visit
Admission: EM | Admit: 2020-08-22 | Discharge: 2020-08-22 | Disposition: A | Discharge status: Home / Self Care | Payer: Medicare HMO

## 2020-08-22 ENCOUNTER — Other Ambulatory Visit: Payer: Self-pay

## 2020-08-22 NOTE — ED Triage Notes (Signed)
Pt here for suTure removal, 10 sutures removed from right arm, steri strips needed to be applied, wound edges healing Wnl

## 2020-09-08 ENCOUNTER — Emergency Department (HOSPITAL_COMMUNITY)
Admission: EM | Admit: 2020-09-08 | Discharge: 2020-09-08 | Discharge status: Against Medical Advice | Payer: Medicare Other

## 2020-09-08 ENCOUNTER — Other Ambulatory Visit: Payer: Self-pay

## 2020-11-28 ENCOUNTER — Ambulatory Visit: Payer: Medicare Other | Admitting: Internal Medicine

## 2021-01-13 NOTE — Progress Notes (Deleted)
Referring Provider: Neale Burly, MD Primary Care Physician:  Neale Burly, MD Primary GI Physician: Dr. Abbey Chatters  No chief complaint on file.   HPI:   Jacqueline [name] is a 70 y.o. female with history of IBS predominantly constipation with occasional diarrhea, nausea, PUD, and GERD.  EGD October 2018 with web in the upper third of esophagus s/p dilation, mild gastritis due to NSAIDs, nausea/vomiting/abdominal pain likely secondary to uncontrolled GERD/gastritis.  Colonoscopy in October 2012 with polypoid lesions and internal hemorrhoids, due for repeat in 2022.  She is presenting today for follow-up.  Last seen in our office 08/03/2019.  At the time of her last visit, she reported worsening GERD symptoms x1 month on Protonix 40 mg twice daily.  Also taking Pepto-Bismol and Tums, drinking milk.  Reported vomiting a couple times when symptoms were severe.  She has been following a bland diet for the last week with some improvement.  Noted roommate at home was cooking with a lot of spices and high fat content which did worsen reflux symptoms but she continue to eat the food.  Denied upper abdominal pain.  Continued to have intermittent nausea depending on what she ate.  Rare solid food dysphagia.  800 mg ibuprofen daily x6 months.  No BRBPR or melena.  BMs every 2-3 days, occasionally hard.  Not on anything for constipation.  Plan to stop Protonix and start Dexilant 60 mg daily, counseled on GERD diet/lifestyle, stop ibuprofen, add Benefiber or Metamucil daily, use MiraLAX 17 g daily as needed, follow-up in 4 weeks.  If GERD uncontrolled, consider EGD.  Patient canceled follow-up in September 2020 and December 2021.  Today:    Past Medical History:  Diagnosis Date  . Anemia   . Anxiety   . Arthritis   . Atrial myxoma    Left atrial  . CAD (coronary artery disease) 04/21/2018   LHC 4/19: no sig CAD, pLAD 40  . Chronic back pain   . Drug-seeking behavior   . Gastric nodule 2009   EUS,  ?leiomyoma  . 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 GERD (gastroesophageal reflux disease)   . Gout   . History of pancreatitis    Biliary and/or etoh?  . Irritable bowel syndrome   . Knee pain   . Migraines   . Migraines   . Peripheral neuropathy   . Pneumonia   . Polysubstance abuse (HCC)    opiates, cocaine, marijuana  . Status post mitral valve repair    Echo 5/19: Mild LVH, EF 60-65, normal wall motion, status post mitral valve repair (mean gradient 4), mild LAE, mildly reduced RVSF    Past Surgical History:  Procedure Laterality Date  . ABDOMINAL HYSTERECTOMY    . ABDOMINAL SURGERY    . BACK SURGERY    . back surgery /ray cage fusion comberg, gso    . BIOPSY  07/31/2015   Procedure: BIOPSY (DUODENAL, GASTRIC, GASTRIC ULCER, ESOPHAGEAL);  Surgeon: Danie Binder, MD;  Location: [facility];  Service: Endoscopy;;  . CESAREAN SECTION    . CHOLECYSTECTOMY    . COLONOSCOPY  2001   Dr. Laural Golden, hemorrhoids  . COLONOSCOPY  10/21/2011   [id number] polyp multiple/internal hemorrhoids  . DILATION AND CURETTAGE OF UTERUS    . ESOPHAGEAL DILATION N/A 07/31/2015   Procedure: ESOPHAGEAL DILATION WIRE GUIDED WITH SAVORY DILATORS 15MM, 16MM;  Surgeon: Marga Melnick  Fields, MD;  Location: [facility];  Service: Endoscopy;  Laterality: N/A;  . ESOPHAGOGASTRODUODENOSCOPY  11/2008   A 9-mm submucosal lesion seen in the cardia., gastritis, no hpylori  . ESOPHAGOGASTRODUODENOSCOPY (EGD) WITH PROPOFOL N/A 07/31/2015   POE:[id number] ulcer and moderate gastritis, dysphagia, empirical dilation with no identified source. esophageal, gastric, duodenal bx unremarkable.   . ESOPHAGOGASTRODUODENOSCOPY (EGD) WITH PROPOFOL N/A 11/13/2015   SLF: 1. Gastric ulcer has healed. 2. single non-bleeding gastric AVMs 3. mild non-erosive gastritis.   Marland Kitchen ESOPHAGOGASTRODUODENOSCOPY (EGD) WITH PROPOFOL N/A 10/20/2017   Procedure:  ESOPHAGOGASTRODUODENOSCOPY (EGD) WITH PROPOFOL;  Surgeon: Danie Binder, MD;  [facility];  Service: Endoscopy;  Laterality: N/A;  2:00pm  . EXCISION OF ATRIAL MYXOMA N/A 04/01/2018   Procedure: RESECTION OF LEFT ATRIAL MYXOMA;  Surgeon: Melrose Nakayama, MD;  Location: [place];  Service: Open Heart Surgery;  Laterality: N/A;  . FOOT SURGERY    . GALLBLADDER SURGERY  2010  . LEFT HEART CATH AND CORONARY ANGIOGRAPHY N/A 03/29/2018   Procedure: LEFT HEART CATH AND CORONARY ANGIOGRAPHY;  Surgeon: Lorretta Harp, MD;  Location: [place] [facility];  Service: Cardiovascular;  Laterality: N/A;  . MITRAL VALVE REPAIR N/A 04/01/2018   Procedure: MITRAL VALVE REPAIR.;  Surgeon: Melrose Nakayama, MD;  Location: [place];  Service: Open Heart Surgery;  Laterality: N/A;  Mitral valve annuloplasty   . 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  Marland Kitchen SAVORY DILATION  02/03/2012   [id number] in the distal esophagus/Mild gastritis  . TEE WITHOUT CARDIOVERSION N/A 04/01/2018   Procedure: TRANSESOPHAGEAL ECHOCARDIOGRAM (TEE);  Surgeon: Melrose Nakayama, MD;  Location: [place];  Service: Open Heart Surgery;  Laterality: N/A;  . toe graft    . TONSILLECTOMY      Current Outpatient Medications  Medication Sig Dispense Refill  . aspirin EC 81 MG tablet Take 1 tablet (81 mg total) by mouth daily. 90 tablet 3  . aspirin-acetaminophen-caffeine (EXCEDRIN MIGRAINE) 250-250-65 MG tablet Take 2 tablets by mouth every 6 (six) hours as needed for headache.    Marland Kitchen atorvastatin (LIPITOR) 40 MG tablet Take 1 tablet (40 mg total) by mouth daily at 6 PM. (Patient taking differently: Take 40 mg by mouth daily. ) 30 tablet 6  . Carboxymethylcellul-Glycerin (LUBRICATING EYE DROPS OP) Place 1 drop into both eyes daily as needed (allergies/dry eyes).    .  cyclobenzaprine (FLEXERIL) 10 MG tablet Take 10 mg by mouth 2 (two) times daily.   0  . dexlansoprazole (DEXILANT) 60 MG capsule Take 1 capsule (60 mg total) by mouth daily. 30 capsule 5  . diphenhydrAMINE (BENADRYL) 25 MG tablet Take 25-50 mg by mouth daily as needed for allergies or sleep.    Marland Kitchen doxycycline (VIBRA-TABS) 100 MG tablet Take 100 mg by mouth 2 (two) times daily.    . ferrous sulfate 325 (65 FE) MG EC tablet Take 325 mg by mouth 3 (three) times daily with meals.    Marland Kitchen FLUoxetine (PROZAC) 40 MG capsule Take 40 mg by mouth daily.   0  . gabapentin (NEURONTIN) 300 MG capsule Take 2 capsules (600 mg total) by mouth 3 (three) times daily. 180 capsule 0  . ondansetron (ZOFRAN) 4 MG tablet Take 4 mg by mouth every 6 (six) hours as needed for nausea or vomiting.    Marland Kitchen  sodium chloride (OCEAN) 0.65 % SOLN nasal spray Place 1-2 sprays into both nostrils as needed for congestion.    . SUBOXONE 8-2 MG FILM Place 1 tablet under the tongue in the morning, at noon, and at bedtime.   0   No current facility-administered medications for this visit.    Allergies as of 01/14/2021 - Review Complete 08/12/2020  Allergen Reaction Noted  . Aspirin Other (See Comments)   . Ketorolac tromethamine Other (See Comments) 09/30/2011  . Nsaids Other (See Comments) 12/18/2011  . Sulfonamide derivatives Nausea And Vomiting and Other (See Comments)   . Imitrex [sumatriptan base] Palpitations 09/30/2011  . Promethazine hcl Other (See Comments) 12/18/2011    Family History  Problem Relation Age of Onset  . Colon cancer Other   . Colon cancer Mother        diagnosed in late 73s and died age 41  . Heart failure Mother   . Heart defect Other        family history   . Arthritis Other        family history  . COPD Other        family history   . Cancer Other        multiple unknown type  . Heart attack Father        deceased at 73  . Hypertension Father   . Heart failure Father   . Lung cancer Maternal  Uncle   . Throat cancer Maternal Uncle   . Colon cancer Paternal Uncle   . Colon cancer Maternal Aunt   . Anesthesia problems Neg Hx   . Hypotension Neg Hx   . Malignant hyperthermia Neg Hx   . Pseudochol deficiency Neg Hx     Social History   Socioeconomic History  . Marital status: Divorced    Spouse name: Not on file  . Number of children: 2  . Years of education: 10th grade  . Highest education level: Not on file  Occupational History  . Occupation: disabled: back problems   Tobacco Use  . Smoking status: Current Every Day Smoker    Packs/day: 0.30    Years: 35.00    Pack years: 10.50    Types: Cigarettes  . Smokeless tobacco: Never Used  Vaping Use  . Vaping Use: Never used  Substance and Sexual Activity  . Alcohol use: Not Currently    Comment: rare  . Drug use: Yes    Frequency: 1.0 times per week    Types: Marijuana    Comment: occ marijuana  . Sexual activity: Not Currently    Birth control/protection: Surgical  Other Topics Concern  . Not on file  Social History Narrative  . Not on file   Social Determinants of Health   Financial Resource Strain: Not on file  Food Insecurity: Not on file  Transportation Needs: Not on file  Physical Activity: Not on file  Stress: Not on file  Social Connections: Not on file    Review of Systems: Gen: Denies fever, chills, anorexia. Denies fatigue, weakness, weight loss.  CV: Denies chest pain, palpitations, syncope, peripheral edema, and claudication. Resp: Denies dyspnea at rest, cough, wheezing, coughing up blood, and pleurisy. GI: Denies vomiting blood, jaundice, and fecal incontinence.   Denies dysphagia or odynophagia. Derm: Denies rash, itching, dry skin Psych: Denies depression, anxiety, memory loss, confusion. No homicidal or suicidal ideation.  Heme: Denies bruising, bleeding, and enlarged lymph nodes.  Physical Exam: There were no vitals taken for this  visit. General:   Alert and oriented. No distress  noted. Pleasant and cooperative.  Head:  Normocephalic and atraumatic. Eyes:  Conjuctiva clear without scleral icterus. Mouth:  Oral mucosa pink and moist. Good dentition. No lesions. Heart:  S1, S2 present without murmurs appreciated. Lungs:  Clear to auscultation bilaterally. No wheezes, rales, or rhonchi. No distress.  Abdomen:  +BS, soft, non-tender and non-distended. No rebound or guarding. No HSM or masses noted. Msk:  Symmetrical without gross deformities. Normal posture. Extremities:  Without edema. Neurologic:  Alert and  oriented x4 Psych:  Alert and cooperative. Normal mood and affect.

## 2021-01-14 ENCOUNTER — Ambulatory Visit: Payer: Medicare Other | Admitting: Gastroenterology

## 2021-01-14 ENCOUNTER — Telehealth: Payer: Self-pay | Admitting: Internal Medicine

## 2021-01-14 NOTE — Telephone Encounter (Signed)
Noted  

## 2021-01-14 NOTE — Telephone Encounter (Signed)
Patient no show x 4 

## 2021-01-29 NOTE — Progress Notes (Signed)
Date:  02/08/2021   ID:  Jacqueline [name], DOB [birth year], MRN [id number]   PCP:  Neale Burly, MD  Cardiologist:  New to me Previously Dr Jacinta Shoe  Electrophysiologist:  None   Evaluation Performed:  Follow-Up Visit  Chief Complaint: Mitral valve repair  History of Present Illness:    Jacqueline [name] is a 70 y.o. female with a history of mitral valve repair and atrial myxoma.  She was hospitalized in April 2019 at [facility] for chest pain and shortness of breath and found to be CHF  Echocardiogram demonstrated a large left atrial mass suspicious for atrial myxoma.  Cardiac catheterization demonstrated no significant coronary artery disease with a proximal 40% LAD stenosis.  She was evaluated by cardiothoracic surgery and underwent resection for atrial myxoma with atrial septal patch and mitral valve repair on 04/01/2018.  She was placed on warfarin for antithrombotic therapy with a plan to remain on it for 3 months with a goal INR of 2.5 followed by aspirin.  She has chronic exertional dyspnea and smokes 1 ppd.  Echo 05/20/18 showed EF 60-65% stable MV repair no MR mean diastolic  Gradient 4 mmHg mild LAE and intact septum   Has had 3 Moderna vaccines Wants Ativan for anxiety Has tried Chantix in past and did not help with smoking cessation   Past Medical History:  Diagnosis Date  . Anemia   . Anxiety   . Arthritis   . Atrial myxoma    Left atrial  . CAD (coronary artery disease) 04/21/2018   LHC 4/19: no sig CAD, pLAD 40  . Chronic back pain   . Drug-seeking behavior   . Gastric nodule 2009   EUS, ?leiomyoma  . 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 GERD (gastroesophageal reflux disease)   . Gout   . History of pancreatitis    Biliary and/or etoh?  . Irritable bowel syndrome   . Knee pain   . Migraines   . Migraines   . Peripheral neuropathy   . Pneumonia   .  Polysubstance abuse (HCC)    opiates, cocaine, marijuana  . Status post mitral valve repair    Echo 5/19: Mild LVH, EF 60-65, normal wall motion, status post mitral valve repair (mean gradient 4), mild LAE, mildly reduced RVSF   Past Surgical History:  Procedure Laterality Date  . ABDOMINAL HYSTERECTOMY    . ABDOMINAL SURGERY    . BACK SURGERY    . back surgery /ray cage fusion comberg, gso    . BIOPSY  07/31/2015   Procedure: BIOPSY (DUODENAL, GASTRIC, GASTRIC ULCER, ESOPHAGEAL);  Surgeon: Danie Binder, MD;  Location: [facility];  Service: Endoscopy;;  . CESAREAN SECTION    . CHOLECYSTECTOMY    . COLONOSCOPY  2001   Dr. Laural Golden, hemorrhoids  . COLONOSCOPY  10/21/2011   [id number] polyp multiple/internal hemorrhoids  . DILATION AND CURETTAGE OF UTERUS    . ESOPHAGEAL DILATION N/A 07/31/2015   Procedure: ESOPHAGEAL DILATION WIRE GUIDED WITH SAVORY DILATORS 15MM, 16MM;  Surgeon: Danie Binder, MD;  Location: [facility];  Service: Endoscopy;  Laterality: N/A;  . ESOPHAGOGASTRODUODENOSCOPY  11/2008   A 9-mm submucosal lesion seen in the cardia., gastritis, no hpylori  . ESOPHAGOGASTRODUODENOSCOPY (EGD) WITH PROPOFOL N/A 07/31/2015   [id number] ulcer and moderate gastritis, dysphagia, empirical dilation with no identified source. esophageal, gastric, duodenal bx unremarkable.   Marland Kitchen  ESOPHAGOGASTRODUODENOSCOPY (EGD) WITH PROPOFOL N/A 11/13/2015   SLF: 1. Gastric ulcer has healed. 2. single non-bleeding gastric AVMs 3. mild non-erosive gastritis.   Marland Kitchen ESOPHAGOGASTRODUODENOSCOPY (EGD) WITH PROPOFOL N/A 10/20/2017   Procedure: ESOPHAGOGASTRODUODENOSCOPY (EGD) WITH PROPOFOL;  Surgeon: Danie Binder, MD;  [facility];  Service: Endoscopy;  Laterality: N/A;  2:00pm  . EXCISION OF ATRIAL MYXOMA N/A 04/01/2018   Procedure: RESECTION OF LEFT ATRIAL MYXOMA;  Surgeon: Melrose Nakayama, MD;  Location: [place];  Service: Open Heart Surgery;  Laterality: N/A;  . FOOT SURGERY    . GALLBLADDER SURGERY   2010  . LEFT HEART CATH AND CORONARY ANGIOGRAPHY N/A 03/29/2018   Procedure: LEFT HEART CATH AND CORONARY ANGIOGRAPHY;  Surgeon: Lorretta Harp, MD;  Location: [place] [facility];  Service: Cardiovascular;  Laterality: N/A;  . MITRAL VALVE REPAIR N/A 04/01/2018   Procedure: MITRAL VALVE REPAIR.;  Surgeon: Melrose Nakayama, MD;  Location: [place];  Service: Open Heart Surgery;  Laterality: N/A;  Mitral valve annuloplasty   . 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  Marland Kitchen SAVORY DILATION  02/03/2012   [id number] in the distal esophagus/Mild gastritis  . TEE WITHOUT CARDIOVERSION N/A 04/01/2018   Procedure: TRANSESOPHAGEAL ECHOCARDIOGRAM (TEE);  Surgeon: Melrose Nakayama, MD;  Location: [place];  Service: Open Heart Surgery;  Laterality: N/A;  . toe graft    . TONSILLECTOMY       Current Meds  Medication Sig  . aspirin EC 81 MG tablet Take 1 tablet (81 mg total) by mouth daily.  Marland Kitchen aspirin-acetaminophen-caffeine (EXCEDRIN MIGRAINE) 250-250-65 MG tablet Take 2 tablets by mouth every 6 (six) hours as needed for headache.  Marland Kitchen atorvastatin (LIPITOR) 40 MG tablet Take 1 tablet (40 mg total) by mouth daily at 6 PM. (Patient taking differently: Take 40 mg by mouth daily.)  . Carboxymethylcellul-Glycerin (LUBRICATING EYE DROPS OP) Place 1 drop into both eyes daily as needed (allergies/dry eyes).  . cyclobenzaprine (FLEXERIL) 10 MG tablet Take 10 mg by mouth 2 (two) times daily.   . diphenhydrAMINE (BENADRYL) 25 MG tablet Take 25-50 mg by mouth daily as needed for allergies or sleep.  Marland Kitchen doxycycline (VIBRAMYCIN) 50 MG capsule Take 50 mg by mouth daily.  . ferrous sulfate 325 (65 FE) MG EC tablet Take 325 mg by mouth 3 (three) times daily with meals.  Marland Kitchen FLUoxetine (PROZAC) 40 MG capsule Take 40 mg by mouth daily.   Marland Kitchen gabapentin (NEURONTIN) 300 MG  capsule Take 2 capsules (600 mg total) by mouth 3 (three) times daily.  Marland Kitchen omeprazole (PRILOSEC) 20 MG capsule Take 20 mg by mouth daily.  . ondansetron (ZOFRAN) 4 MG tablet Take 4 mg by mouth every 6 (six) hours as needed for nausea or vomiting.  . sodium chloride (OCEAN) 0.65 % SOLN nasal spray Place 1-2 sprays into both nostrils as needed for congestion.  . SUBOXONE 8-2 MG FILM Place 1 tablet under the tongue in the morning, at noon, and at bedtime.  . valACYclovir (VALTREX) 1000 MG tablet Take 1,000 mg by mouth 3 (three) times daily.     Allergies:   Aspirin, Ketorolac tromethamine, Nsaids, Sulfonamide derivatives, Imitrex [sumatriptan base], and Promethazine hcl   Social History   Tobacco Use  . Smoking status: Current Every Day Smoker    Packs/day: 0.30  Years: 35.00    Pack years: 10.50    Types: Cigarettes  . Smokeless tobacco: Never Used  Vaping Use  . Vaping Use: Never used  Substance Use Topics  . Alcohol use: Not Currently    Comment: rare  . Drug use: Yes    Frequency: 1.0 times per week    Types: Marijuana    Comment: occ marijuana     Family Hx: The patient's family history includes Arthritis in an other family member; COPD in an other family member; Cancer in an other family member; Colon cancer in her maternal aunt, mother, paternal uncle, and another family member; Heart attack in her father; Heart defect in an other family member; Heart failure in her father and mother; Hypertension in her father; Lung cancer in her maternal uncle; Throat cancer in her maternal uncle. There is no history of Anesthesia problems, Hypotension, Malignant hyperthermia, or Pseudochol deficiency.  ROS:   Please see the history of present illness.     All other systems reviewed and are negative.   Prior CV studies:   The following studies were reviewed today:  Echocardiogram 05/20/2018:  - Left ventricle: The cavity size was normal. Wall thickness was   increased in a pattern  of mild LVH. Systolic function was normal.   The estimated ejection fraction was in the range of 60% to 65%.   Wall motion was normal; there were no regional wall motion   abnormalities. Left ventricular diastolic function parameters   were normal. - Mitral valve: S/p mitral valve repair. - Left atrium: The atrium was mildly dilated. - Right ventricle: Systolic function was mildly reduced.  Labs/Other Tests and Data Reviewed:    EKG:   SR PaCls inferior T wave changes 09/06/18   Recent Labs: No results found for requested labs within last 8760 hours.   Recent Lipid Panel Lab Results  Component Value Date/Time   CHOL 204 (H) 03/31/2018 02:33 AM   TRIG 55 03/31/2018 02:33 AM   HDL 60 03/31/2018 02:33 AM   CHOLHDL 3.4 03/31/2018 02:33 AM   LDLCALC 133 (H) 03/31/2018 02:33 AM    Wt Readings from Last 3 Encounters:  02/08/21 55.3 kg  08/12/20 51.7 kg  03/08/20 55.3 kg     Objective:    Vital Signs:  BP 128/80   Pulse 82   Ht 5' 2.5" (1.588 m)   Wt 55.3 kg   SpO2 96%   BMI 21.96 kg/m    Affect appropriate Healthy:  appears stated age 36: normal Neck supple with no adenopathy JVP normal no bruits no thyromegaly Lungs clear with no wheezing and good diaphragmatic motion Heart:  S1/S2 no murmur, no rub, gallop or click PMI normal post median sternotomy  Abdomen: benighn, BS positve, no tenderness, no AAA no bruit.  No HSM or HJR Distal pulses intact with no bruits No edema Neuro non-focal Skin warm and dry No muscular weakness   ASSESSMENT & PLAN:    1.  History of atrial myxoma: Status post resection 04/02/18 [facility] . Stable.  2.  Status post mitral valve repair: TTE  2019 no residual MR update echo . Continue SBE prophylaxis   3.  Coronary artery disease: Mild nonobstructive disease in the proximal LAD (40%).  She is on atorvastatin.  4.  Tobacco abuse: She smokes 1 ppd. Counseled on cessation < 10 minutes Discuss Ativan with primary  As well as  lung cancer screening CT     Medication Adjustments/Labs and Tests Ordered:  Current medicines are reviewed at length with the patient today.  Concerns regarding medicines are outlined above.   Tests Ordered: No orders of the defined types were placed in this encounter. None   Medication Changes: No orders of the defined types were placed in this encounter.   Follow Up:  1 year   Signed, Jenkins Rouge, MD  02/08/2021 9:33 AM    [place]

## 2021-02-08 ENCOUNTER — Encounter (INDEPENDENT_AMBULATORY_CARE_PROVIDER_SITE_OTHER): Payer: Self-pay

## 2021-02-08 ENCOUNTER — Encounter: Payer: Self-pay | Admitting: Cardiovascular Disease

## 2021-02-08 ENCOUNTER — Ambulatory Visit (INDEPENDENT_AMBULATORY_CARE_PROVIDER_SITE_OTHER): Payer: Medicare Other | Admitting: Cardiovascular Disease

## 2021-02-08 ENCOUNTER — Other Ambulatory Visit: Payer: Self-pay

## 2021-02-08 VITALS — BP 128/80 | HR 82 | Ht 62.5 in | Wt 122.0 lb

## 2021-02-08 DIAGNOSIS — Z86018 Personal history of other benign neoplasm: Secondary | ICD-10-CM | POA: Diagnosis not present

## 2021-02-08 DIAGNOSIS — Z9889 Other specified postprocedural states: Secondary | ICD-10-CM

## 2021-02-08 DIAGNOSIS — F17200 Nicotine dependence, unspecified, uncomplicated: Secondary | ICD-10-CM | POA: Diagnosis not present

## 2021-02-08 NOTE — Patient Instructions (Signed)

## 2021-02-08 NOTE — Addendum Note (Signed)
Addended by: Barbarann Ehlers A on: 02/08/2021 11:03 AM   Modules accepted: Orders

## 2021-04-22 NOTE — Progress Notes (Signed)
Referring Provider: Neale Burly, MD Primary Care Physician:  Neale Burly, MD Primary GI Physician: Dr. Abbey Chatters  Chief Complaint  Patient presents with  . Gastroesophageal Reflux    C/o reflux, nausea, vomiting, burning up in throat    HPI:   Jacqueline [name] is a 70 y.o. female presenting today for uncontrolled GERD. History of GERD, PUD, nausea, IBS usually with constipation and occasional diarrhea. Last EGD 10/20/2017: Web in upper third of esophagus, dilated. Mild gastritis due to NSAIDs. Was felt nausea and vomiting/abdominal pain secondary to uncontrolled GERD/gastritis. Colonoscopy in October 2012 with multiple small sessile polyps (benign pathology), and internal hemorrhoids.  Random colon biopsies at that time also benign.  Recommended repeat colonoscopy in 10 years.  Last seen in our office 08/03/2019.  GERD was not adequately controlled on Protonix 40 mg twice daily.  Couple episodes of emesis when reflux was severe.  No hematemesis.  She has backed down to a bland diet and noted improvement.  Noted her roommate had been cooking with a lot of spices and foods with high fat content.  Her regular solid food dysphagia.  Taking 800 mg ibuprofen daily x6 months.  Bowels are moving well with BMs every 2-3 days.  Did not feel she needed anything to help with bowel regularity at that time.  Protonix was changed to Dexilant 60 mg daily.  Counseled on GERD diet/lifestyle and decreasing ibuprofen.  Recommended Benefiber or Metamucil daily to help with bowel regularity use MiraLAX as needed.  Plan to follow-up in 4 weeks and consider EGD if GERD remains uncontrolled.  Patient no-showed or cancelled her follow-up appointments.  Today:  GERD daily. Nausea with intermittent vomiting about once a week if symptoms are severe. Intermittent dysphagia to solid items. Feels foods go down slowly. No trouble with soft foods or liquids. Drinks coke daily. Trying to lay off spicy foods, but she loves  this. No fried foods.   Can't remember if she was able to get Dexilant. Has taken OTC Nexium in the past. Omeprazole 20 mg daily for about 1 year.   Some mid to upper abdominal pain with eating. More of a fullness.  Some weight loss due to limiting her diet secondary to upper GI symptoms. No brbpr or melena. No hematemesis. If taking iron, she gets constipated. No diarrhea. Reports her hemoglobin dropped down to about 10-11, 3-4 months ago. Blood work was with Dr. Sherrie Sport. Has repeat blood work on August 2nd.   Excedrin migraine rarely. Ibuprofen rarely.    Past Medical History:  Diagnosis Date  . Anemia   . Anxiety   . Arthritis   . Atrial myxoma    Left atrial  . CAD (coronary artery disease) 04/21/2018   LHC 4/19: no sig CAD, pLAD 40  . Chronic back pain   . Drug-seeking behavior   . Gastric nodule 2009   EUS, ?leiomyoma  . 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 GERD (gastroesophageal reflux disease)   . Gout   . History of pancreatitis    Biliary and/or etoh?  . Irritable bowel syndrome   . Knee pain   . Migraines   . Migraines   . Peripheral neuropathy   . Pneumonia   . Polysubstance abuse (HCC)    opiates, cocaine, marijuana  . Status post mitral valve repair    Echo 5/19: Mild LVH, EF 60-65, normal  wall motion, status post mitral valve repair (mean gradient 4), mild LAE, mildly reduced RVSF    Past Surgical History:  Procedure Laterality Date  . ABDOMINAL HYSTERECTOMY    . ABDOMINAL SURGERY    . BACK SURGERY    . back surgery /ray cage fusion comberg, gso    . BIOPSY  07/31/2015   Procedure: BIOPSY (DUODENAL, GASTRIC, GASTRIC ULCER, ESOPHAGEAL);  Surgeon: Danie Binder, MD;  Location: [facility];  Service: Endoscopy;;  . CESAREAN SECTION    . CHOLECYSTECTOMY    . COLONOSCOPY  2001   Dr. Laural Golden, hemorrhoids  . COLONOSCOPY  10/21/2011   BTD:[id number] polyp multiple/internal  hemorrhoids  . DILATION AND CURETTAGE OF UTERUS    . ESOPHAGEAL DILATION N/A 07/31/2015   Procedure: ESOPHAGEAL DILATION WIRE GUIDED WITH SAVORY DILATORS 15MM, 16MM;  Surgeon: Danie Binder, MD;  Location: [facility];  Service: Endoscopy;  Laterality: N/A;  . ESOPHAGOGASTRODUODENOSCOPY  11/2008   A 9-mm submucosal lesion seen in the cardia., gastritis, no hpylori  . ESOPHAGOGASTRODUODENOSCOPY (EGD) WITH PROPOFOL N/A 07/31/2015   PXT:[id number] ulcer and moderate gastritis, dysphagia, empirical dilation with no identified source. esophageal, gastric, duodenal bx unremarkable.   . ESOPHAGOGASTRODUODENOSCOPY (EGD) WITH PROPOFOL N/A 11/13/2015   SLF: 1. Gastric ulcer has healed. 2. single non-bleeding gastric AVMs 3. mild non-erosive gastritis.   Marland Kitchen ESOPHAGOGASTRODUODENOSCOPY (EGD) WITH PROPOFOL N/A 10/20/2017   Surgeon: Danie Binder, MD;   web in the upper third of the esophagus s/p dilation, gastritis due to NSAIDs.   Marland Kitchen EXCISION OF ATRIAL MYXOMA N/A 04/01/2018   Procedure: RESECTION OF LEFT ATRIAL MYXOMA;  Surgeon: Melrose Nakayama, MD;  Location: [facility];  Service: Open Heart Surgery;  Laterality: N/A;  . FOOT SURGERY    . GALLBLADDER SURGERY  2010  . LEFT HEART CATH AND CORONARY ANGIOGRAPHY N/A 03/29/2018   Procedure: LEFT HEART CATH AND CORONARY ANGIOGRAPHY;  Surgeon: Lorretta Harp, MD;  Location: [facility];  Service: Cardiovascular;  Laterality: N/A;  . MITRAL VALVE REPAIR N/A 04/01/2018   Procedure: MITRAL VALVE REPAIR.;  Surgeon: Melrose Nakayama, MD;  Location: [facility];  Service: Open Heart Surgery;  Laterality: N/A;  Mitral valve annuloplasty   . 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  Marland Kitchen SAVORY DILATION  02/03/2012   NIO:[id number] in the distal esophagus/Mild gastritis  . TEE WITHOUT CARDIOVERSION N/A 04/01/2018    Procedure: TRANSESOPHAGEAL ECHOCARDIOGRAM (TEE);  Surgeon: Melrose Nakayama, MD;  Location: [facility];  Service: Open Heart Surgery;  Laterality: N/A;  . toe graft    . TONSILLECTOMY      Current Outpatient Medications  Medication Sig Dispense Refill  . aspirin EC 81 MG tablet Take 1 tablet (81 mg total) by mouth daily. 90 tablet 3  . aspirin-acetaminophen-caffeine (EXCEDRIN MIGRAINE) 250-250-65 MG tablet Take 2 tablets by mouth every 6 (six) hours as needed for headache.    Marland Kitchen atorvastatin (LIPITOR) 40 MG tablet Take 1 tablet (40 mg total) by mouth daily at 6 PM. (Patient taking differently: Take 40 mg by mouth daily.) 30 tablet 6  . Carboxymethylcellul-Glycerin (LUBRICATING EYE DROPS OP) Place 1 drop into both eyes daily as needed (allergies/dry eyes).    . cyclobenzaprine (FLEXERIL) 10 MG tablet Take 10 mg by mouth 2 (two) times daily.   0  . diphenhydrAMINE (  BENADRYL) 25 MG tablet Take 25-50 mg by mouth daily as needed for allergies or sleep.    . ferrous sulfate 325 (65 FE) MG EC tablet Take 325 mg by mouth. Takes 1 week then stops 1 week, then restarts for 1 week    . FLUoxetine (PROZAC) 40 MG capsule Take 40 mg by mouth daily.   0  . gabapentin (NEURONTIN) 300 MG capsule Take 2 capsules (600 mg total) by mouth 3 (three) times daily. 180 capsule 0  . omeprazole (PRILOSEC) 40 MG capsule Take 1 capsule (40 mg total) by mouth 2 (two) times daily before a meal. 60 capsule 3  . ondansetron (ZOFRAN) 4 MG tablet Take 4 mg by mouth every 6 (six) hours as needed for nausea or vomiting.    . sodium chloride (OCEAN) 0.65 % SOLN nasal spray Place 1-2 sprays into both nostrils as needed for congestion.    . SUBOXONE 8-2 MG FILM Place 1 tablet under the tongue in the morning, at noon, and at bedtime.  0   No current facility-administered medications for this visit.    Allergies as of 04/24/2021 - Review Complete 04/24/2021  Allergen Reaction Noted  . Aspirin Other (See Comments)   . Ketorolac  tromethamine Other (See Comments) 09/30/2011  . Nsaids Other (See Comments) 12/18/2011  . Sulfonamide derivatives Nausea And Vomiting and Other (See Comments)   . Imitrex [sumatriptan base] Palpitations 09/30/2011  . Promethazine hcl Other (See Comments) 12/18/2011    Family History  Problem Relation Age of Onset  . Colon cancer Other   . Colon cancer Mother        diagnosed in late 56s and died age 80  . Heart failure Mother   . Heart defect Other        family history   . Arthritis Other        family history  . COPD Other        family history   . Cancer Other        multiple unknown type  . Heart attack Father        deceased at 52  . Hypertension Father   . Heart failure Father   . Lung cancer Maternal Uncle   . Throat cancer Maternal Uncle   . Colon cancer Paternal Uncle   . Colon cancer Maternal Aunt   . Anesthesia problems Neg Hx   . Hypotension Neg Hx   . Malignant hyperthermia Neg Hx   . Pseudochol deficiency Neg Hx     Social History   Socioeconomic History  . Marital status: Divorced    Spouse name: Not on file  . Number of children: 2  . Years of education: 10th grade  . Highest education level: Not on file  Occupational History  . Occupation: disabled: back problems   Tobacco Use  . Smoking status: Current Every Day Smoker    Packs/day: 0.30    Years: 35.00    Pack years: 10.50    Types: Cigarettes  . Smokeless tobacco: Never Used  Vaping Use  . Vaping Use: Never used  Substance and Sexual Activity  . Alcohol use: Not Currently    Comment: rare  . Drug use: Yes    Frequency: 1.0 times per week    Types: Marijuana    Comment: occ marijuana  . Sexual activity: Not Currently    Birth control/protection: Surgical  Other Topics Concern  . Not on file  Social History Narrative  . Not  on file   Social Determinants of Health   Financial Resource Strain: Not on file  Food Insecurity: Not on file  Transportation Needs: Not on file  Physical  Activity: Not on file  Stress: Not on file  Social Connections: Not on file    Review of Systems: Gen: Denies fever, chills, cold or flu like symptoms, lightheadedness, dizziness, presyncope, syncope. CV: Denies chest pain or palpitations. Resp: Denies dyspnea or cough. GI: See HPI Heme: See HPI  Physical Exam: BP (!) 142/100   Pulse 89   Temp (!) 97.3 F (36.3 C)   Ht 5\' 3"  (1.6 m)   Wt 117 lb 12.8 oz (53.4 kg)   BMI 20.87 kg/m  General:   Alert and oriented. No distress noted. Pleasant and cooperative.  Head:  Normocephalic and atraumatic. Eyes:  Conjuctiva clear without scleral icterus. Heart:  S1, S2 present without murmurs appreciated. Lungs:  Clear to auscultation bilaterally. No wheezes, rales, or rhonchi. No distress.  Abdomen: +BS, soft, non-distended. Mild TTP in the mid and epigastric abdominal area. No rebound or guarding. No HSM or masses noted. Msk:  Symmetrical without gross deformities. Normal posture. Extremities:  Without edema. Neurologic:  Alert and  oriented x4 Psych: Normal mood and affect.   Assessment: 70 year old female with history of GERD, PUD in 2016, nonbleeding gastric AVM noted in 2016, presenting today with chief complaint of uncontrolled GERD.  Also reporting dysphagia, anemia, and mild constipation.   GERD: Chronic.  Uncontrolled on omeprazole 20 mg daily.  Associated nausea with intermittent vomiting, mid to upper abdominal pain, and weight loss secondary to significant upper GI symptoms.  Symptoms are likely influenced by dietary indiscretion.  Previously taking ibuprofen daily, but reports this is fairly rare at this point.  Also with dysphagia and anemia discussed below.  We will plan to increase omeprazole to 40 mg twice daily and proceed with EGD to evaluate her dysphagia, anemia, and rule out gastritis, duodenitis, PUD, H. Pylori.   Dysphagia: Intermittent solid food dysphagia with sensation of items going down her esophagus slowly.  This  is in the setting of uncontrolled GERD as per above.  Last EGD October 2018 with web in the upper third of the esophagus s/p dilation, gastritis due to NSAIDs. Differentials include esophagitis, esophageal webb, ring, stricture, and less likely malignancy. We will proceed with EGD with possible dilation in the near future.   Anemia: Reports blood per per PCP 3 to 4 months ago with hemoglobin declined to 10-11 range.  She was started on oral iron.  He denies overt GI bleeding.  Upper GI symptoms discussed above.  No significant lower GI symptoms aside from intermittent constipation related to iron. Previously taking ibuprofen daily, but reports that this fairly rare at this point.  Also takes Excedrin Migraine regularly.  Last colonoscopy October 2012 with multiple sessile polyps (benign pathology) hemorrhoids with recommendations to repeat in 10 years.  Last EGD October 2018 with web in the upper third of the esophagus s/p dilation, gastritis due to NSAIDs. With significant upper GI symptoms, we will plan for EGD in the near future and circle back to colonoscopy later this year. Will update CBC and iron panel as well.   Constipation: Intermittent. Secondary to oral iron. Advised to start MiraLAX 17g daily.   Plan: 1.  CBC and iron panel.  2.  Increase omeprazole to 40 mg twice daily.  3.  Proceed with EGD with possible dilation with propofol with Dr. Abbey Chatters future. The risks,  benefits, and alternatives have been discussed with the patient in detail. The patient states understanding and desires to proceed.  ASA III Hold iron x7 days prior to procedure.  4.  Reinforced GERD diet/lifestyle.  5.  Avoid all NSAIDs.  6.  Start MiraLAX 17 g daily.  7.  Follow-up after procedure.    Aliene Altes, PA-C [facility] 04/24/2021

## 2021-04-24 ENCOUNTER — Other Ambulatory Visit: Payer: Self-pay

## 2021-04-24 ENCOUNTER — Encounter: Payer: Self-pay | Admitting: Gastroenterology

## 2021-04-24 ENCOUNTER — Encounter: Payer: Self-pay | Admitting: *Deleted

## 2021-04-24 ENCOUNTER — Ambulatory Visit (INDEPENDENT_AMBULATORY_CARE_PROVIDER_SITE_OTHER): Payer: Medicare Other | Admitting: Gastroenterology

## 2021-04-24 VITALS — BP 142/100 | HR 89 | Temp 97.3°F | Ht 63.0 in | Wt 117.8 lb

## 2021-04-24 DIAGNOSIS — D649 Anemia, unspecified: Secondary | ICD-10-CM | POA: Diagnosis not present

## 2021-04-24 DIAGNOSIS — K5900 Constipation, unspecified: Secondary | ICD-10-CM | POA: Diagnosis not present

## 2021-04-24 DIAGNOSIS — K219 Gastro-esophageal reflux disease without esophagitis: Secondary | ICD-10-CM

## 2021-04-24 DIAGNOSIS — R1310 Dysphagia, unspecified: Secondary | ICD-10-CM | POA: Diagnosis not present

## 2021-04-24 MED ORDER — OMEPRAZOLE 40 MG PO CPDR: 40.0000 mg | DELAYED_RELEASE_CAPSULE | Freq: Two times a day (BID) | ORAL | 3 refills | Status: DC

## 2021-04-24 NOTE — Patient Instructions (Addendum)
Have blood work completed at [facility]  We will arrange for her to have an upper endoscopy with possible dilation of your esophagus near future with Dr. Abbey Chatters. Hold iron x7 days prior to procedure.   Stop omeprazole 20 mg and start omeprazole 40 mg twice daily 30 minutes before breakfast and dinner.  Follow a GERD diet:  Avoid fried, fatty, greasy, spicy, citrus foods. Avoid caffeine and carbonated beverages. Avoid chocolate. Try eating 4-6 small meals a day rather than 3 large meals. Do not eat within 3 hours of laying down. Prop head of bed up on wood or bricks to create a 6 inch incline.  Recommend you avoid all NSAID products including ibuprofen, Aleve, Advil, Goody powders, BC powders, Excedrin, and anything that says "NSAID" on the package.  For constipation secondary to iron:  Take MiraLAX 1 capful (17g daily).   We will plan to see you back after your procedure.   Aliene Altes, PA-C [facility]

## 2021-05-01 NOTE — Progress Notes (Signed)
CC'ED TO PCP 

## 2021-05-07 ENCOUNTER — Other Ambulatory Visit: Payer: Self-pay | Admitting: Gastroenterology

## 2021-05-07 ENCOUNTER — Telehealth: Payer: Self-pay | Admitting: *Deleted

## 2021-05-07 DIAGNOSIS — R112 Nausea with vomiting, unspecified: Secondary | ICD-10-CM

## 2021-05-07 MED ORDER — ONDANSETRON HCL 4 MG PO TABS: 4.0000 mg | ORAL_TABLET | Freq: Three times a day (TID) | ORAL | 0 refills | Status: AC | PRN

## 2021-05-07 NOTE — Telephone Encounter (Signed)
Rx sent 

## 2021-05-07 NOTE — Telephone Encounter (Signed)
Patient called. States when she saw Cyril Mourning last week she forgot to ask if she would call in zofran for her. She uses [facility].

## 2021-05-29 NOTE — Patient Instructions (Signed)
30    Your procedure is scheduled on: [id number]  Report to [facility] entrance at  6:15   AM.  Call this number if you have problems the morning of surgery: [phone]   Remember:   Follow instructions on letter from office regarding when to stop eating and drinking        No Smoking the day of procedure      Take these medicines the morning of surgery with A SIP OF WATER: Omeprazole, Prozac, Zyrtec, Fexeril, Gabapentin, Zofran and/or Suboxone   Do not wear jewelry, make-up or nail polish.  Do not wear lotions, powders, or perfumes. You may wear deodorant.                Do not bring valuables to the hospital.  Contacts, dentures or bridgework may not be worn into surgery.  Leave suitcase in the car. After surgery it may be brought to your room.  For patients admitted to the hospital, checkout time is 11:00 AM the day of discharge.   Patients discharged the day of surgery will not be allowed to drive home. Upper Endoscopy, Adult Upper endoscopy is a procedure to look inside the upper GI (gastrointestinal) tract. The upper GI tract is made up of:  The part of the body that moves food from your mouth to your stomach (esophagus).  The stomach.  The first part of your small intestine (duodenum). This procedure is also called esophagogastroduodenoscopy (EGD) or gastroscopy. In this procedure, your health care provider passes a thin, flexible tube (endoscope) through your mouth and down your esophagus into your stomach. A small camera is attached to the end of the tube. Images from the camera appear on a monitor in the exam room. During this procedure, your health care provider may also remove a small piece of tissue to be sent to a lab and examined under a microscope (biopsy). Your health care provider may do an upper endoscopy to diagnose cancers of the upper GI tract. You may also have this procedure to find the cause of other conditions, such as:  Stomach pain.  Heartburn.  Pain  or problems when swallowing.  Nausea and vomiting.  Stomach bleeding.  Stomach ulcers. Tell a health care provider about:  Any allergies you have.  All medicines you are taking, including vitamins, herbs, eye drops, creams, and over-the-counter medicines.  Any problems you or family members have had with anesthetic medicines.  Any blood disorders you have.  Any surgeries you have had.  Any medical conditions you have.  Whether you are pregnant or may be pregnant. What are the risks? Generally, this is a safe procedure. However, problems may occur, including:  Infection.  Bleeding.  Allergic reactions to medicines.  A tear or hole (perforation) in the esophagus, stomach, or duodenum. What happens before the procedure? Staying hydrated Follow instructions from your health care provider about hydration, which may include:  Up to 2 hours before the procedure - you may continue to drink clear liquids, such as water, clear fruit juice, black coffee, and plain tea.  Eating and drinking restrictions Follow instructions from your health care provider about eating and drinking, which may include:  8 hours before the procedure - stop eating heavy meals or foods, such as meat, fried foods, or fatty foods.  6 hours before the procedure - stop eating light meals or foods, such as toast or cereal.  6 hours before the procedure - stop drinking milk or drinks that contain  milk.  2 hours before the procedure - stop drinking clear liquids. Medicines Ask your health care provider about:  Changing or stopping your regular medicines. This is especially important if you are taking diabetes medicines or blood thinners.  Taking medicines such as aspirin and ibuprofen. These medicines can thin your blood. Do not take these medicines unless your health care provider tells you to take them.  Taking over-the-counter medicines, vitamins, herbs, and supplements. General instructions  Plan  to have someone take you home from the hospital or clinic.  If you will be going home right after the procedure, plan to have someone with you for 24 hours.  Ask your health care provider what steps will be taken to help prevent infection. What happens during the procedure?  1. An IV will be inserted into one of your veins. 2. You may be given one or more of the following: ? A medicine to help you relax (sedative). ? A medicine to numb the throat (local anesthetic). 3. You will lie on your left side on an exam table. 4. Your health care provider will pass the endoscope through your mouth and down your esophagus. 5. Your health care provider will use the scope to check the inside of your esophagus, stomach, and duodenum. Biopsies may be taken. 6. The endoscope will be removed. The procedure may vary among health care providers and hospitals. What happens after the procedure?  Your blood pressure, heart rate, breathing rate, and blood oxygen level will be monitored until you leave the hospital or clinic.  Do not drive for 24 hours if you were given a sedative during your procedure.  When your throat is no longer numb, you may be given some fluids to drink.  It is up to you to get the results of your procedure. Ask your health care provider, or the department that is doing the procedure, when your results will be ready. Summary  Upper endoscopy is a procedure to look inside the upper GI tract.  During the procedure, an IV will be inserted into one of your veins. You may be given a medicine to help you relax.  A medicine will be used to numb your throat.  The endoscope will be passed through your mouth and down your esophagus. This information is not intended to replace advice given to you by your health care provider. Make sure you discuss any questions you have with your health care provider. Document Revised: 06/02/2018 Document Reviewed: 05/10/2018 Elsevier Patient Education   [place] After  Please read the instructions outlined below and refer to this sheet in the next few weeks. These discharge instructions provide you with general information on caring for yourself after you  leave the hospital. Your doctor may also give you specific instructions. While your treatment has been planned according to the most current medical practices available, unavoidable complications occasionally occur. If you have any problems or questions after discharge, please call your doctor. HOME CARE INSTRUCTIONS Activity  You may resume your regular activity but move at a slower pace for the next 24 hours.   Take frequent rest periods for the next 24 hours.   Walking will help expel (get rid of) the air and reduce the bloated feeling in your abdomen.   No driving for 24 hours (because of the anesthesia (medicine) used during the test).   You may shower.   Do not sign any important legal documents or operate any machinery for 24 hours (because of the anesthesia used during the test).  Nutrition  Drink plenty of fluids.   You may resume your normal diet.   Begin with a light meal and progress to your normal diet.   Avoid alcoholic beverages for 24 hours or as instructed by your caregiver.  Medications You may resume your normal medications unless your caregiver tells you otherwise. What you can expect today  You may experience abdominal discomfort such as a feeling of fullness or "gas" pains.   You may experience a sore throat for 2 to 3 days. This is normal. Gargling with salt water may help this.  Follow-up Your doctor will discuss the results of your test with you. SEEK IMMEDIATE MEDICAL CARE IF:  You have excessive nausea (feeling sick to your stomach) and/or vomiting.   You have severe abdominal pain and  distention (swelling).   You have trouble swallowing.   You have a temperature over 100 F (37.8 C).   You have rectal bleeding or vomiting of blood.  Document Released: 07/22/2004 Document Revised: 11/27/2011 Document Reviewed: 02/02/2008

## 2021-05-30 LAB — CBC WITH DIFFERENTIAL/PLATELET
Absolute Monocytes: 420 cells/uL (ref 200–950)
Basophils Absolute: 39 cells/uL (ref 0–200)
Basophils Relative: 0.7 %
Eosinophils Absolute: 280 cells/uL (ref 15–500)
Eosinophils Relative: 5 %
HCT: 38.2 % (ref 35.0–45.0)
Hemoglobin: 12.4 g/dL (ref 11.7–15.5)
Lymphs Abs: 2100 cells/uL (ref 850–3900)
MCH: 30.2 pg (ref 27.0–33.0)
MCHC: 32.5 g/dL (ref 32.0–36.0)
MCV: 93.2 fL (ref 80.0–100.0)
MPV: 9.2 fL (ref 7.5–12.5)
Monocytes Relative: 7.5 %
Neutro Abs: 2761 cells/uL (ref 1500–7800)
Neutrophils Relative %: 49.3 %
Platelets: 276 10*3/uL (ref 140–400)
RBC: 4.1 10*6/uL (ref 3.80–5.10)
RDW: 12 % (ref 11.0–15.0)
Total Lymphocyte: 37.5 %
WBC: 5.6 10*3/uL (ref 3.8–10.8)

## 2021-05-30 LAB — IRON,TIBC AND FERRITIN PANEL
%SAT: 23 % (calc) (ref 16–45)
Ferritin: 118 ng/mL (ref 16–288)
Iron: 65 ug/dL (ref 45–160)
TIBC: 287 mcg/dL (calc) (ref 250–450)

## 2021-06-03 ENCOUNTER — Other Ambulatory Visit: Payer: Self-pay

## 2021-06-03 ENCOUNTER — Other Ambulatory Visit (HOSPITAL_COMMUNITY): Payer: Medicare Other | Attending: Internal Medicine

## 2021-06-03 ENCOUNTER — Encounter (HOSPITAL_COMMUNITY): Payer: Self-pay

## 2021-06-03 ENCOUNTER — Encounter (HOSPITAL_COMMUNITY)
Admission: RE | Admit: 2021-06-03 | Discharge: 2021-06-03 | Disposition: A | Discharge status: Home / Self Care | Payer: Medicare Other | Source: Ambulatory Visit | Attending: Internal Medicine | Admitting: Internal Medicine

## 2021-06-03 DIAGNOSIS — Z01812 Encounter for preprocedural laboratory examination: Secondary | ICD-10-CM | POA: Diagnosis present

## 2021-06-03 LAB — RAPID URINE DRUG SCREEN, HOSP PERFORMED
Amphetamines: NOT DETECTED
Barbiturates: NOT DETECTED
Benzodiazepines: NOT DETECTED
Cocaine: NOT DETECTED
Opiates: NOT DETECTED
Tetrahydrocannabinol: POSITIVE — AB

## 2021-06-03 LAB — BASIC METABOLIC PANEL
Anion gap: 7 (ref 5–15)
BUN: 6 mg/dL — ABNORMAL LOW (ref 8–23)
CO2: 30 mmol/L (ref 22–32)
Calcium: 8.8 mg/dL — ABNORMAL LOW (ref 8.9–10.3)
Chloride: 101 mmol/L (ref 98–111)
Creatinine, Ser: 0.59 mg/dL (ref 0.44–1.00)
GFR, Estimated: >60 mL/min (ref >60)
Glucose, Bld: 97 mg/dL (ref 70–99)
Potassium: 3.3 mmol/L — ABNORMAL LOW (ref 3.5–5.1)
Sodium: 138 mmol/L (ref 135–145)

## 2021-06-04 ENCOUNTER — Ambulatory Visit (HOSPITAL_COMMUNITY): Payer: Medicare Other | Admitting: Certified Registered Nurse Anesthetist

## 2021-06-04 ENCOUNTER — Encounter (HOSPITAL_COMMUNITY)
Admission: RE | Disposition: A | Discharge status: Home / Self Care | Payer: Self-pay | Source: Home / Self Care | Attending: Internal Medicine

## 2021-06-04 ENCOUNTER — Encounter (HOSPITAL_COMMUNITY): Payer: Self-pay

## 2021-06-04 ENCOUNTER — Other Ambulatory Visit: Payer: Self-pay

## 2021-06-04 ENCOUNTER — Encounter: Payer: Self-pay | Admitting: Internal Medicine

## 2021-06-04 ENCOUNTER — Ambulatory Visit (HOSPITAL_COMMUNITY)
Admission: RE | Admit: 2021-06-04 | Discharge: 2021-06-04 | Disposition: A | Discharge status: Home / Self Care | Payer: Medicare Other | Attending: Internal Medicine | Admitting: Internal Medicine

## 2021-06-04 DIAGNOSIS — Z888 Allergy status to other drugs, medicaments and biological substances status: Secondary | ICD-10-CM | POA: Insufficient documentation

## 2021-06-04 DIAGNOSIS — K219 Gastro-esophageal reflux disease without esophagitis: Secondary | ICD-10-CM | POA: Insufficient documentation

## 2021-06-04 DIAGNOSIS — Z882 Allergy status to sulfonamides status: Secondary | ICD-10-CM | POA: Insufficient documentation

## 2021-06-04 DIAGNOSIS — Z791 Long term (current) use of non-steroidal anti-inflammatories (NSAID): Secondary | ICD-10-CM | POA: Insufficient documentation

## 2021-06-04 DIAGNOSIS — R1310 Dysphagia, unspecified: Secondary | ICD-10-CM | POA: Insufficient documentation

## 2021-06-04 DIAGNOSIS — Z7982 Long term (current) use of aspirin: Secondary | ICD-10-CM | POA: Insufficient documentation

## 2021-06-04 DIAGNOSIS — Z801 Family history of malignant neoplasm of trachea, bronchus and lung: Secondary | ICD-10-CM | POA: Insufficient documentation

## 2021-06-04 DIAGNOSIS — Z886 Allergy status to analgesic agent status: Secondary | ICD-10-CM | POA: Diagnosis not present

## 2021-06-04 DIAGNOSIS — Z8261 Family history of arthritis: Secondary | ICD-10-CM | POA: Insufficient documentation

## 2021-06-04 DIAGNOSIS — K2970 Gastritis, unspecified, without bleeding: Secondary | ICD-10-CM | POA: Diagnosis not present

## 2021-06-04 DIAGNOSIS — Z79899 Other long term (current) drug therapy: Secondary | ICD-10-CM | POA: Diagnosis not present

## 2021-06-04 DIAGNOSIS — R110 Nausea: Secondary | ICD-10-CM | POA: Insufficient documentation

## 2021-06-04 DIAGNOSIS — Z91048 Other nonmedicinal substance allergy status: Secondary | ICD-10-CM | POA: Diagnosis not present

## 2021-06-04 DIAGNOSIS — Z825 Family history of asthma and other chronic lower respiratory diseases: Secondary | ICD-10-CM | POA: Insufficient documentation

## 2021-06-04 DIAGNOSIS — K222 Esophageal obstruction: Secondary | ICD-10-CM | POA: Insufficient documentation

## 2021-06-04 DIAGNOSIS — Z800 Family history of malignant neoplasm of digestive organs: Secondary | ICD-10-CM | POA: Insufficient documentation

## 2021-06-04 DIAGNOSIS — F17210 Nicotine dependence, cigarettes, uncomplicated: Secondary | ICD-10-CM | POA: Insufficient documentation

## 2021-06-04 DIAGNOSIS — Z8249 Family history of ischemic heart disease and other diseases of the circulatory system: Secondary | ICD-10-CM | POA: Diagnosis not present

## 2021-06-04 HISTORY — PX: ESOPHAGOGASTRODUODENOSCOPY (EGD) WITH PROPOFOL: SHX5813

## 2021-06-04 HISTORY — PX: BALLOON DILATION: SHX5330

## 2021-06-04 SURGERY — ESOPHAGOGASTRODUODENOSCOPY (EGD) WITH PROPOFOL
Anesthesia: General

## 2021-06-04 MED ORDER — LIDOCAINE HCL (CARDIAC) PF 100 MG/5ML IV SOSY
PREFILLED_SYRINGE | INTRAVENOUS | Status: DC | PRN
  Administered 2021-06-04: 50 mg via INTRAVENOUS

## 2021-06-04 MED ORDER — PROPOFOL 10 MG/ML IV BOLUS
INTRAVENOUS | Status: DC | PRN
  Administered 2021-06-04: 30 mg via INTRAVENOUS
  Administered 2021-06-04: 40 mg via INTRAVENOUS
  Administered 2021-06-04: 100 mg via INTRAVENOUS

## 2021-06-04 MED ORDER — LACTATED RINGERS IV SOLN: INTRAVENOUS | Status: DC

## 2021-06-04 NOTE — Op Note (Signed)
[facility] Patient Name: Jacqueline [name] Procedure Date: 06/04/2021 7:17 AM MRN: [id number] Date of Birth: Mar 24, 1951 Attending MD: Elon Alas. Abbey Chatters DO CSN: [id number] Age: 70 Admit Type: Outpatient Procedure:                Upper GI endoscopy Indications:              Dysphagia, Heartburn, Nausea Providers:                Elon Alas. Tahjai Schetter, DO, Otis Peak B. Sharon Seller, RN,                            Randa Spike, Technician Referring MD:              Medicines:                See the Anesthesia note for documentation of the                            administered medications Complications:            No immediate complications. Estimated Blood Loss:     Estimated blood loss was minimal. Procedure:                Pre-Anesthesia Assessment:                           - The anesthesia plan was to use monitored                            anesthesia care (MAC).                           After obtaining informed consent, the endoscope was                            passed under direct vision. Throughout the                            procedure, the patient's blood pressure, pulse, and                            oxygen saturations were monitored continuously. The                            GIF-H190 ([id number]) scope was introduced through the                            mouth, and advanced to the second part of duodenum.                            The upper GI endoscopy was accomplished without                            difficulty. The patient tolerated the procedure                            well. Scope In:  7:37:06 AM Scope Out: 7:43:18 AM Total Procedure Duration: 0 hours 6 minutes 12 seconds  Findings:      There is no endoscopic evidence of bleeding, areas of erosion,       esophagitis, ulcerations or varices in the entire esophagus.      One benign-appearing, intrinsic mild stenosis was found in the lower       third of the esophagus. The stenosis was traversed. A TTS dilator was        passed through the scope. Dilation with an 18-19-20 mm balloon dilator       was performed to 20 mm. The dilation site was examined and showed mild       mucosal disruption and moderate improvement in luminal narrowing.      Localized mild inflammation characterized by erosions and erythema was       found in the gastric antrum. Biopsies were taken with a cold forceps for       Helicobacter pylori testing.      The duodenal bulb, first portion of the duodenum and second portion of       the duodenum were normal. Impression:               - Benign-appearing esophageal stenosis. Dilated.                           - Gastritis. Biopsied.                           - Normal duodenal bulb, first portion of the                            duodenum and second portion of the duodenum. Moderate Sedation:      Per Anesthesia Care Recommendation:           - Patient has a contact number available for                            emergencies. The signs and symptoms of potential                            delayed complications were discussed with the                            patient. Return to normal activities tomorrow.                            Written discharge instructions were provided to the                            patient.                           - Resume previous diet.                           - Continue present medications.                           - Await pathology results.                           -  Repeat upper endoscopy PRN for retreatment.                           - Use a proton pump inhibitor PO BID.                           - Return to GI clinic in 3 months. Procedure Code(s):        --- Professional ---                           [phone], Esophagogastroduodenoscopy, flexible,                            transoral; with transendoscopic balloon dilation of                            esophagus (less than 30 mm diameter)                           [id number], 59,  Esophagogastroduodenoscopy, flexible,                            transoral; with biopsy, single or multiple Diagnosis Code(s):        --- Professional ---                           K22.2, Esophageal obstruction                           K29.70, Gastritis, unspecified, without bleeding                           R13.10, Dysphagia, unspecified                           R12, Heartburn                           R11.0, Nausea CPT copyright 2019 [employer] Association. All rights reserved. The codes documented in this report are preliminary and upon coder review may  be revised to meet current compliance requirements. Elon Alas. Abbey Chatters, DO [name], DO 06/04/2021 7:47:29 AM This report has been signed electronically. Number of Addenda: 0

## 2021-06-04 NOTE — Anesthesia Postprocedure Evaluation (Signed)
Anesthesia Post Note  Patient: Jacqueline [name]  Procedure(s) Performed: ESOPHAGOGASTRODUODENOSCOPY (EGD) WITH PROPOFOL BALLOON DILATION  Patient location during evaluation: Phase II Anesthesia Type: General Level of consciousness: awake Pain management: pain level controlled Vital Signs Assessment: post-procedure vital signs reviewed and stable Respiratory status: spontaneous breathing and respiratory function stable Cardiovascular status: blood pressure returned to baseline and stable Postop Assessment: no headache and no apparent nausea or vomiting Anesthetic complications: no Comments: Late entry   No notable events documented.   Last Vitals:  Vitals:   06/04/21 0634 06/04/21 0747  BP: (!) 149/100 117/77  Pulse: 72 65  Resp: 10 12  Temp: 36.8 C 36.9 C  SpO2: 96% 95%    Last Pain:  Vitals:   06/04/21 0747  TempSrc: Oral  PainSc: 0-No pain                 Louann Sjogren

## 2021-06-04 NOTE — Anesthesia Preprocedure Evaluation (Signed)
Anesthesia Evaluation  Patient identified by MRN, date of birth, ID band Patient awake    Reviewed: Allergy & Precautions, H&P , NPO status , Patient's Chart, lab work & pertinent test results, reviewed documented beta blocker date and time   Airway Mallampati: II  TM Distance: >3 FB Neck ROM: full    Dental no notable dental hx.    Pulmonary pneumonia, resolved, COPD, Current Smoker,    Pulmonary exam normal breath sounds clear to auscultation       Cardiovascular Exercise Tolerance: Good hypertension,  Rhythm:regular Rate:Normal     Neuro/Psych  Headaches, PSYCHIATRIC DISORDERS Anxiety Depression  Neuromuscular disease    GI/Hepatic Neg liver ROS, PUD, GERD  Medicated,  Endo/Other  negative endocrine ROS  Renal/GU negative Renal ROS  negative genitourinary   Musculoskeletal   Abdominal   Peds  Hematology  (+) Blood dyscrasia, anemia ,   Anesthesia Other Findings   Reproductive/Obstetrics negative OB ROS                             Anesthesia Physical Anesthesia Plan  ASA: 3  Anesthesia Plan: General   Post-op Pain Management:    Induction:   PONV Risk Score and Plan: Propofol infusion  Airway Management Planned:   Additional Equipment:   Intra-op Plan:   Post-operative Plan:   Informed Consent: I have reviewed the patients History and Physical, chart, labs and discussed the procedure including the risks, benefits and alternatives for the proposed anesthesia with the patient or authorized representative who has indicated his/her understanding and acceptance.     Dental Advisory Given  Plan Discussed with: CRNA  Anesthesia Plan Comments:         Anesthesia Quick Evaluation

## 2021-06-04 NOTE — Transfer of Care (Signed)
Immediate Anesthesia Transfer of Care Note  Patient: Jacqueline [name]  Procedure(s) Performed: ESOPHAGOGASTRODUODENOSCOPY (EGD) WITH PROPOFOL BALLOON DILATION  Patient Location: PACU and Short Stay  Anesthesia Type:General  Level of Consciousness: awake  Airway & Oxygen Therapy: Patient Spontanous Breathing  Post-op Assessment: Report given to RN and Post -op Vital signs reviewed and stable  Post vital signs: Reviewed and stable  Last Vitals:  Vitals Value Taken Time  BP 117/77 06/04/21 0747  Temp 36.9 C 06/04/21 0747  Pulse 65 06/04/21 0747  Resp 12 06/04/21 0747  SpO2 95 % 06/04/21 0747    Last Pain:  Vitals:   06/04/21 0747  TempSrc: Oral  PainSc: 0-No pain         Complications: No notable events documented.

## 2021-06-04 NOTE — Discharge Instructions (Signed)
EGD Discharge instructions Please read the instructions outlined below and refer to this sheet in the next few weeks. These discharge instructions provide you with general information on caring for yourself after you leave the hospital. Your doctor may also give you specific instructions. While your treatment has been planned according to the most current medical practices available, unavoidable complications occasionally occur. If you have any problems or questions after discharge, please call your doctor. ACTIVITY You may resume your regular activity but move at a slower pace for the next 24 hours.  Take frequent rest periods for the next 24 hours.  Walking will help expel (get rid of) the air and reduce the bloated feeling in your abdomen.  No driving for 24 hours (because of the anesthesia (medicine) used during the test).  You may shower.  Do not sign any important legal documents or operate any machinery for 24 hours (because of the anesthesia used during the test).  NUTRITION Drink plenty of fluids.  You may resume your normal diet.  Begin with a light meal and progress to your normal diet.  Avoid alcoholic beverages for 24 hours or as instructed by your caregiver.  MEDICATIONS You may resume your normal medications unless your caregiver tells you otherwise.  WHAT YOU CAN EXPECT TODAY You may experience abdominal discomfort such as a feeling of fullness or "gas" pains.  FOLLOW-UP Your doctor will discuss the results of your test with you.  SEEK IMMEDIATE MEDICAL ATTENTION IF ANY OF THE FOLLOWING OCCUR: Excessive nausea (feeling sick to your stomach) and/or vomiting.  Severe abdominal pain and distention (swelling).  Trouble swallowing.  Temperature over 101 F (37.8 C).  Rectal bleeding or vomiting of blood.    Your EGD revealed a mild amount inflammation in your stomach.  I took biopsies of this for all infection a bacteria called H. pylori. You also had a narrowing of your  esophagus which I dilated with a balloon today.  Hopefully this helps with your swallowing.  Continue on omeprazole twice daily.  Follow-up with GI in 3 months.  I hope you have a great rest of your week!  Elon Alas. Abbey Chatters, D.O. Gastroenterology and Hepatology [facility]

## 2021-06-04 NOTE — H&P (Signed)
Primary Care Physician:  Neale Burly, MD Primary Gastroenterologist:  Dr. Abbey Chatters  Pre-Procedure History & Physical: HPI:  Jacqueline [name] is a 70 y.o. female is here for an EGD for GERD, Nausea, dysphagia. Currently taking Omeprazole 40 mg BID without much improvement in her symptoms.   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   Irritable bowel syndrome    Knee pain    Migraines    Migraines    Peripheral neuropathy    Pneumonia    Polysubstance abuse (HCC)    opiates, cocaine, marijuana   Status post mitral valve repair    Echo 5/19: Mild LVH, EF 60-65, normal wall motion, status post mitral valve repair (mean gradient 4), mild LAE, mildly reduced RVSF    Past Surgical History:  Procedure Laterality Date   ABDOMINAL HYSTERECTOMY     ABDOMINAL SURGERY     BACK SURGERY     back surgery /ray cage fusion com[place], [place]     BIOPSY  07/31/2015   Procedure: BIOPSY (DUODENAL, GASTRIC, GASTRIC ULCER, ESOPHAGEAL);  Surgeon: Danie Binder, MD;  Location: [facility];  Service: Endoscopy;;   CESAREAN SECTION     CHOLECYSTECTOMY     COLONOSCOPY  2001   Dr. Laural Golden, hemorrhoids   COLONOSCOPY  10/21/2011   KXF:GHWEXHB polyp multiple/internal hemorrhoids   DILATION AND CURETTAGE OF UTERUS     ESOPHAGEAL DILATION N/A 07/31/2015   Procedure: ESOPHAGEAL DILATION WIRE GUIDED WITH SAVORY DILATORS 15MM, 16MM;  Surgeon: Danie Binder, MD;  Location: [facility];  Service: Endoscopy;  Laterality: N/A;   ESOPHAGOGASTRODUODENOSCOPY  11/2008   A 9-mm  submucosal lesion seen in the cardia., gastritis, no hpylori   ESOPHAGOGASTRODUODENOSCOPY (EGD) WITH PROPOFOL N/A 07/31/2015   ZJI:RCVELFY ulcer and moderate gastritis, dysphagia, empirical dilation with no identified source. esophageal, gastric, duodenal bx unremarkable.    ESOPHAGOGASTRODUODENOSCOPY (EGD) WITH PROPOFOL N/A 11/13/2015   SLF: 1. Gastric ulcer has healed. 2. single non-bleeding gastric AVMs 3. mild non-erosive gastritis.    ESOPHAGOGASTRODUODENOSCOPY (EGD) WITH PROPOFOL N/A 10/20/2017   Surgeon: Danie Binder, MD;   web in the upper third of the esophagus s/p dilation, gastritis due to NSAIDs.    EXCISION OF ATRIAL MYXOMA N/A 04/01/2018   Procedure: RESECTION OF LEFT ATRIAL MYXOMA;  Surgeon: Melrose Nakayama, MD;  Location: [place];  Service: Open Heart Surgery;  Laterality: N/A;   FOOT SURGERY     GALLBLADDER SURGERY  2010   LEFT HEART CATH AND CORONARY ANGIOGRAPHY N/A 03/29/2018   Procedure: LEFT HEART CATH AND CORONARY ANGIOGRAPHY;  Surgeon: Lorretta Harp, MD;  Location: [place] [facility];  Service: Cardiovascular;  Laterality: N/A;   MITRAL VALVE REPAIR N/A 04/01/2018   Procedure: MITRAL VALVE REPAIR.;  Surgeon: Melrose Nakayama, MD;  Location: [place];  Service: Open Heart Surgery;  Laterality: N/A;  Mitral valve annuloplasty    PARTIAL GASTRECTOMY  1990   stomach tumor (large submucosal tumor felt to be be a myoma, but 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   SAVORY DILATION  02/03/2012   WGN:FAOZHYQMV in the distal esophagus/Mild gastritis   TEE WITHOUT CARDIOVERSION N/A 04/01/2018   Procedure: TRANSESOPHAGEAL ECHOCARDIOGRAM (TEE);  Surgeon: Melrose Nakayama, MD;  Location: [place];  Service: Open Heart Surgery;  Laterality: N/A;   toe graft     TONSILLECTOMY      Prior to Admission medications   Medication Sig Start Date End Date Taking? Authorizing Provider  acetaminophen (TYLENOL) 500 MG  tablet Take 500-1,000 mg by mouth every 6 (six) hours as needed for moderate pain.   Yes [provider]  aspirin EC 81 MG tablet Take 1 tablet (81 mg total) by mouth daily. 07/22/18  Yes Herminio Commons, MD  atorvastatin (LIPITOR) 40 MG tablet Take 1 tablet (40 mg total) by mouth daily at 6 PM. Patient taking differently: Take 40 mg by mouth daily. 06/07/18  Yes Herminio Commons, MD  Carboxymethylcellul-Glycerin (LUBRICATING EYE DROPS OP) Place 1 drop into both eyes daily as needed (allergies/dry eyes).   Yes [provider]  cetirizine (ZYRTEC) 10 MG tablet Take 10 mg by mouth daily.   Yes [provider]  cyclobenzaprine (FLEXERIL) 10 MG tablet Take 10 mg by mouth 3 (three) times daily. 09/22/17  Yes [provider]  diphenhydrAMINE (BENADRYL) 25 MG tablet Take 25-50 mg by mouth daily as needed for allergies or sleep.   Yes [provider]  ferrous sulfate 325 (65 FE) MG EC tablet Take 325 mg by mouth See admin instructions. [id number] mg by mouth one week then stops 1 week, then restarts for 1 week   Yes [provider]  FLUoxetine (PROZAC) 40 MG capsule Take 40 mg by mouth daily.  09/22/17  Yes [provider]  gabapentin (NEURONTIN) 300 MG capsule Take 2 capsules (600 mg total) by mouth 3 (three) times daily. 04/30/15  Yes Niel Hummer, NP  omeprazole (PRILOSEC) 40 MG capsule Take 1 capsule (40 mg total) by mouth 2 (two) times daily before a meal. 04/24/21  Yes Jodi Mourning, Kristen S, PA-C  ondansetron (ZOFRAN) 4 MG tablet Take 1 tablet (4 mg total) by mouth every 8 (eight) hours as needed for nausea or vomiting. 05/07/21  Yes Erenest Rasher, PA-C  sodium chloride (OCEAN) 0.65 % SOLN nasal spray Place 1-2 sprays into both nostrils as needed for congestion.   Yes [provider]  SUBOXONE 8-2 MG FILM Place 1 tablet under the tongue in the morning, at noon, and at bedtime. 04/20/18  Yes [provider]    Allergies as of  04/24/2021 - Review Complete 04/24/2021  Allergen Reaction Noted   Aspirin Other (See Comments)    Ketorolac tromethamine Other (See Comments) 09/30/2011   Nsaids Other (See Comments) 12/18/2011   Sulfonamide derivatives Nausea And Vomiting and Other (See Comments)    Imitrex [sumatriptan base] Palpitations 09/30/2011   Promethazine hcl Other (See Comments) 12/18/2011    Family History  Problem Relation Age of Onset   Colon cancer Other    Colon cancer Mother        diagnosed in late 32s and died age 55   Heart failure Mother    Heart defect Other        family history    Arthritis Other  family history   COPD Other        family history    Cancer Other        multiple unknown type   Heart attack Father        deceased at 49   Hypertension Father    Heart failure Father    Lung cancer Maternal Uncle    Throat cancer Maternal Uncle    Colon cancer Paternal Uncle    Colon cancer Maternal Aunt    Anesthesia problems Neg Hx    Hypotension Neg Hx    Malignant hyperthermia Neg Hx    Pseudochol deficiency Neg Hx     Social History   Socioeconomic History   Marital status: Divorced    Spouse name: Not on file   Number of children: 2   Years of education: 10th grade   Highest education level: Not on file  Occupational History   Occupation: disabled: back problems   Tobacco Use   Smoking status: Every Day    Packs/day: 0.30    Years: 35.00    Pack years: 10.50    Types: Cigarettes   Smokeless tobacco: Never  Vaping Use   Vaping Use: Never used  Substance and Sexual Activity   Alcohol use: Not Currently    Comment: rare   Drug use: Yes    Frequency: 1.0 times per week    Types: Marijuana    Comment: occ marijuana   Sexual activity: Not Currently    Birth control/protection: Surgical  Other Topics Concern   Not on file  Social History Narrative   Not on file   Social Determinants of Health   Financial Resource Strain: Not on file  Food Insecurity:  Not on file  Transportation Needs: Not on file  Physical Activity: Not on file  Stress: Not on file  Social Connections: Not on file  Intimate Partner Violence: Not on file    Review of Systems: See HPI, otherwise negative ROS  Physical Exam: Vital signs in last 24 hours: Temp:  [97.6 F (36.4 C)-98.2 F (36.8 C)] 98.2 F (36.8 C) (06/14 0634) Pulse Rate:  [72] 72 (06/14 0634) Resp:  [10-18] 10 (06/14 0634) BP: (149-166)/(99-100) 149/100 (06/14 0634) SpO2:  [96 %-100 %] 96 % (06/14 0634) Weight:  [53.5 kg] 53.5 kg (06/14 0634)   General:   Alert,  Well-developed, well-nourished, pleasant and cooperative in NAD Head:  Normocephalic and atraumatic. Eyes:  Sclera clear, no icterus.   Conjunctiva pink. Ears:  Normal auditory acuity. Nose:  No deformity, discharge,  or lesions. Mouth:  No deformity or lesions, dentition normal. Neck:  Supple; no masses or thyromegaly. Lungs:  Clear throughout to auscultation.   No wheezes, crackles, or rhonchi. No acute distress. Heart:  Regular rate and rhythm; no murmurs, clicks, rubs,  or gallops. Abdomen:  Soft, nontender and nondistended. No masses, hepatosplenomegaly or hernias noted. Normal bowel sounds, without guarding, and without rebound.   Msk:  Symmetrical without gross deformities. Normal posture. Extremities:  Without clubbing or edema. Neurologic:  Alert and  oriented x4;  grossly normal neurologically. Skin:  Intact without significant lesions or rashes. Cervical Nodes:  No significant cervical adenopathy. Psych:  Alert and cooperative. Normal mood and affect.  Impression/Plan: Jacqueline [name] is here for an EGD for GERD, Nausea, dysphagia. Currently taking Omeprazole 40 mg BID without much improvement in her symptoms.   The risks of the procedure including infection, bleed, or perforation as well as benefits, limitations, alternatives  and imponderables have been reviewed with the patient. Questions have been answered. All  parties agreeable.

## 2021-06-05 LAB — SURGICAL PATHOLOGY

## 2021-06-11 ENCOUNTER — Encounter (HOSPITAL_COMMUNITY): Payer: Self-pay | Admitting: Internal Medicine

## 2021-06-13 ENCOUNTER — Encounter: Payer: Self-pay | Admitting: *Deleted

## 2021-07-17 ENCOUNTER — Other Ambulatory Visit: Payer: Self-pay | Admitting: Gastroenterology

## 2021-07-17 DIAGNOSIS — K219 Gastro-esophageal reflux disease without esophagitis: Secondary | ICD-10-CM

## 2021-10-05 NOTE — Progress Notes (Deleted)
Referring Provider: Neale Burly, MD Primary Care Physician:  Neale Burly, MD Primary GI Physician: Dr. Abbey Chatters  No chief complaint on file.   HPI:   Jacqueline [name] is a 70 y.o. female with history of GERD, dysphagia s/p empiric esophageal dilations, PUD in 2016, nonbleeding gastric AVM in 2016, benign colon polyps in 2012 due for colonoscopy, presenting today for follow-up of GERD, dysphagia, anemia, and constipation.  Last seen in our office 04/24/2021 for GERD, dysphagia, anemia, and constipation.  Reported daily GERD symptoms on omeprazole 20 mg daily, associated intermittent nausea/vomiting, intermittent solid food dysphagia, and postprandial mid/upper abdominal pain/fullness.  Drinking Coke daily.  Loves spicy food.  Reported some weight loss due to limiting oral intake secondary to upper GI symptoms.  Denied overt GI bleeding.  Reported hemoglobin declined to 10-11 range 3-4 months ago.  If taking iron, she gets constipated.  Plan included increasing omeprazole to 40 mg twice daily, EGD with possible dilation, circle back to colonoscopy later this year, update CBC and iron panel, start MiraLAX daily.  Labs 05/29/2021: Hemoglobin 12.4 with normocytic indices, iron panel within normal limits.  EGD 06/04/2021: Benign-appearing esophageal stenosis dilated, gastritis biopsied (reactive gastropathy), normal examined duodenum.  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   Irritable bowel syndrome    Knee pain     Migraines    Migraines    Peripheral neuropathy    Pneumonia    Polysubstance abuse (HCC)    opiates, cocaine, marijuana   Status post mitral valve repair    Echo 5/19: Mild LVH, EF 60-65, normal wall motion, status post mitral valve repair (mean gradient 4), mild LAE, mildly reduced RVSF    Past Surgical History:  Procedure Laterality Date   ABDOMINAL HYSTERECTOMY     ABDOMINAL SURGERY     BACK SURGERY     back surgery /ray cage fusion [place], [place]     BALLOON DILATION N/A 06/04/2021   Procedure: BALLOON DILATION;  Surgeon: Eloise Harman, DO;  [facility];  Service: Endoscopy;  Laterality: N/A;   BIOPSY  07/31/2015   Procedure: BIOPSY (DUODENAL, GASTRIC, GASTRIC ULCER, ESOPHAGEAL);  Surgeon: Danie Binder, MD;  Location: [facility];  Service: Endoscopy;;   CESAREAN SECTION     CHOLECYSTECTOMY     COLONOSCOPY  2001   Dr. Laural Golden, hemorrhoids   COLONOSCOPY  10/21/2011   [id number] polyp multiple/internal hemorrhoids   DILATION AND CURETTAGE OF UTERUS     ESOPHAGEAL DILATION N/A 07/31/2015   Procedure: ESOPHAGEAL DILATION WIRE GUIDED WITH SAVORY DILATORS 15MM, 16MM;  Surgeon: Danie Binder, MD;  Location: [facility];  Service: Endoscopy;  Laterality: N/A;   ESOPHAGOGASTRODUODENOSCOPY  11/2008   A 9-mm submucosal lesion seen in the cardia., gastritis, no hpylori   ESOPHAGOGASTRODUODENOSCOPY (EGD) WITH PROPOFOL N/A 07/31/2015   PTW:[id number] ulcer and moderate gastritis, dysphagia,  empirical dilation with no identified source. esophageal, gastric, duodenal bx unremarkable.    ESOPHAGOGASTRODUODENOSCOPY (EGD) WITH PROPOFOL N/A 11/13/2015   SLF: 1. Gastric ulcer has healed. 2. single non-bleeding gastric AVMs 3. mild non-erosive gastritis.    ESOPHAGOGASTRODUODENOSCOPY (EGD) WITH PROPOFOL N/A 10/20/2017   Surgeon: Danie Binder, MD;   web in the upper third of the esophagus s/p dilation, gastritis due to NSAIDs.    ESOPHAGOGASTRODUODENOSCOPY (EGD) WITH PROPOFOL N/A 06/04/2021    Procedure: ESOPHAGOGASTRODUODENOSCOPY (EGD) WITH PROPOFOL;  Surgeon: Eloise Harman, DO;  [facility];  Service: Endoscopy;  Laterality: N/A;  7:30am   EXCISION OF ATRIAL MYXOMA N/A 04/01/2018   Procedure: RESECTION OF LEFT ATRIAL MYXOMA;  Surgeon: Melrose Nakayama, MD;  Location: [place];  Service: Open Heart Surgery;  Laterality: N/A;   FOOT SURGERY     GALLBLADDER SURGERY  2010   LEFT HEART CATH AND CORONARY ANGIOGRAPHY N/A 03/29/2018   Procedure: LEFT HEART CATH AND CORONARY ANGIOGRAPHY;  Surgeon: Lorretta Harp, MD;  Location: [facility];  Service: Cardiovascular;  Laterality: N/A;   MITRAL VALVE REPAIR N/A 04/01/2018   Procedure: MITRAL VALVE REPAIR.;  Surgeon: Melrose Nakayama, MD;  Location: [place];  Service: Open Heart Surgery;  Laterality: N/A;  Mitral valve annuloplasty    PARTIAL GASTRECTOMY  1990   stomach tumor (large submucosal tumor felt to be be a myoma, but 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   SAVORY DILATION  02/03/2012   [id number] in the distal esophagus/Mild gastritis   TEE WITHOUT CARDIOVERSION N/A 04/01/2018   Procedure: TRANSESOPHAGEAL ECHOCARDIOGRAM (TEE);  Surgeon: Melrose Nakayama, MD;  Location: [place];  Service: Open Heart Surgery;  Laterality: N/A;   toe graft     TONSILLECTOMY      Current Outpatient Medications  Medication Sig Dispense Refill   acetaminophen (TYLENOL) 500 MG tablet Take 500-1,000 mg by mouth every 6 (six) hours as needed for moderate pain.     aspirin EC 81 MG tablet Take 1 tablet (81 mg total) by mouth daily. 90 tablet 3   atorvastatin (LIPITOR) 40 MG tablet Take 1 tablet (40 mg total) by mouth daily at 6 PM. (Patient taking differently: Take 40 mg by mouth daily.) 30 tablet 6   Carboxymethylcellul-Glycerin (LUBRICATING EYE DROPS OP) Place 1 drop into both eyes daily as needed (allergies/dry eyes).     cetirizine (ZYRTEC) 10 MG tablet  Take 10 mg by mouth daily.     cyclobenzaprine (FLEXERIL) 10 MG tablet Take 10 mg by mouth 3 (three) times daily.  0   diphenhydrAMINE (BENADRYL) 25 MG tablet Take 25-50 mg by mouth daily as needed for allergies or sleep.     ferrous sulfate 325 (65 FE) MG EC tablet Take 325 mg by mouth See admin instructions. [id number] mg by mouth one week then stops 1 week, then restarts for 1 week     FLUoxetine (PROZAC) 40 MG capsule Take 40 mg by mouth daily.   0   gabapentin (NEURONTIN) 300 MG capsule Take 2 capsules (600 mg total) by mouth 3 (three) times daily. 180 capsule 0   omeprazole (PRILOSEC) 40 MG capsule TAKE 1 CAPSULE(40 MG) BY MOUTH TWICE DAILY BEFORE A MEAL 60 capsule 5   ondansetron (ZOFRAN) 4 MG tablet Take 1 tablet (4 mg total) by mouth every 8 (eight) hours as needed for nausea or vomiting. 20 tablet 0  sodium chloride (OCEAN) 0.65 % SOLN nasal spray Place 1-2 sprays into both nostrils as needed for congestion.     SUBOXONE 8-2 MG FILM Place 1 tablet under the tongue in the morning, at noon, and at bedtime.  0   No current facility-administered medications for this visit.    Allergies as of 10/07/2021 - Review Complete 06/04/2021  Allergen Reaction Noted   Aspirin Other (See Comments)    Ketorolac tromethamine Other (See Comments) 09/30/2011   Nsaids Other (See Comments) 12/18/2011   Sulfonamide derivatives Nausea And Vomiting and Other (See Comments)    Imitrex [sumatriptan base] Palpitations 09/30/2011   Promethazine hcl Other (See Comments) 12/18/2011    Family History  Problem Relation Age of Onset   Colon cancer Other    Colon cancer Mother        diagnosed in late 58s and died age 73   Heart failure Mother    Heart defect Other        family history    Arthritis Other        family history   COPD Other        family history    Cancer Other        multiple unknown type   Heart attack Father        deceased at 42   Hypertension Father    Heart failure Father    Lung  cancer Maternal Uncle    Throat cancer Maternal Uncle    Colon cancer Paternal Uncle    Colon cancer Maternal Aunt    Anesthesia problems Neg Hx    Hypotension Neg Hx    Malignant hyperthermia Neg Hx    Pseudochol deficiency Neg Hx     Social History   Socioeconomic History   Marital status: Divorced    Spouse name: Not on file   Number of children: 2   Years of education: 10th grade   Highest education level: Not on file  Occupational History   Occupation: disabled: back problems   Tobacco Use   Smoking status: Every Day    Packs/day: 0.30    Years: 35.00    Pack years: 10.50    Types: Cigarettes   Smokeless tobacco: Never  Vaping Use   Vaping Use: Never used  Substance and Sexual Activity   Alcohol use: Not Currently    Comment: rare   Drug use: Yes    Frequency: 1.0 times per week    Types: Marijuana    Comment: occ marijuana   Sexual activity: Not Currently    Birth control/protection: Surgical  Other Topics Concern   Not on file  Social History Narrative   Not on file   Social Determinants of Health   Financial Resource Strain: Not on file  Food Insecurity: Not on file  Transportation Needs: Not on file  Physical Activity: Not on file  Stress: Not on file  Social Connections: Not on file    Review of Systems: Gen: Denies fever, chills, anorexia. Denies fatigue, weakness, weight loss.  CV: Denies chest pain, palpitations, syncope, peripheral edema, and claudication. Resp: Denies dyspnea at rest, cough, wheezing, coughing up blood, and pleurisy. GI: Denies vomiting blood, jaundice, and fecal incontinence.   Denies dysphagia or odynophagia. Derm: Denies rash, itching, dry skin Psych: Denies depression, anxiety, memory loss, confusion. No homicidal or suicidal ideation.  Heme: Denies bruising, bleeding, and enlarged lymph nodes.  Physical Exam: There were no vitals taken for this visit. General:  Alert and oriented. No distress noted. Pleasant and  cooperative.  Head:  Normocephalic and atraumatic. Eyes:  Conjuctiva clear without scleral icterus. Mouth:  Oral mucosa pink and moist. Good dentition. No lesions. Heart:  S1, S2 present without murmurs appreciated. Lungs:  Clear to auscultation bilaterally. No wheezes, rales, or rhonchi. No distress.  Abdomen:  +BS, soft, non-tender and non-distended. No rebound or guarding. No HSM or masses noted. Msk:  Symmetrical without gross deformities. Normal posture. Extremities:  Without edema. Neurologic:  Alert and  oriented x4 Psych:  Alert and cooperative. Normal mood and affect.

## 2021-10-07 ENCOUNTER — Encounter: Payer: Self-pay | Admitting: Gastroenterology

## 2021-10-07 ENCOUNTER — Ambulatory Visit: Payer: Medicare Other | Admitting: Gastroenterology

## 2021-10-11 ENCOUNTER — Encounter: Payer: Self-pay | Admitting: *Deleted

## 2022-01-04 ENCOUNTER — Other Ambulatory Visit: Payer: Self-pay | Admitting: Gastroenterology

## 2022-01-04 DIAGNOSIS — K219 Gastro-esophageal reflux disease without esophagitis: Secondary | ICD-10-CM

## 2022-01-09 DIAGNOSIS — Z0000 Encounter for general adult medical examination without abnormal findings: Secondary | ICD-10-CM | POA: Diagnosis not present

## 2022-01-09 DIAGNOSIS — Z6822 Body mass index (BMI) 22.0-22.9, adult: Secondary | ICD-10-CM | POA: Diagnosis not present

## 2022-01-09 DIAGNOSIS — E782 Mixed hyperlipidemia: Secondary | ICD-10-CM | POA: Diagnosis not present

## 2022-01-09 DIAGNOSIS — K219 Gastro-esophageal reflux disease without esophagitis: Secondary | ICD-10-CM | POA: Diagnosis not present

## 2022-01-28 ENCOUNTER — Ambulatory Visit
Admission: EM | Admit: 2022-01-28 | Discharge: 2022-01-28 | Disposition: A | Discharge status: Home / Self Care | Payer: Medicare Other | Attending: Family Medicine | Admitting: Family Medicine

## 2022-01-28 ENCOUNTER — Other Ambulatory Visit: Payer: Self-pay

## 2022-01-28 DIAGNOSIS — S76212A Strain of adductor muscle, fascia and tendon of left thigh, initial encounter: Secondary | ICD-10-CM | POA: Diagnosis not present

## 2022-01-28 MED ORDER — CYCLOBENZAPRINE HCL 10 MG PO TABS: 10.0000 mg | ORAL_TABLET | Freq: Three times a day (TID) | ORAL | 0 refills | Status: DC | PRN

## 2022-01-28 MED ORDER — ONDANSETRON 4 MG PO TBDP: 4.0000 mg | ORAL_TABLET | Freq: Three times a day (TID) | ORAL | 0 refills | Status: DC | PRN

## 2022-01-28 MED ORDER — DEXAMETHASONE SODIUM PHOSPHATE 10 MG/ML IJ SOLN
10.0000 mg | Freq: Once | INTRAMUSCULAR | Status: AC
  Administered 2022-01-28: 10 mg via INTRAMUSCULAR

## 2022-01-28 NOTE — ED Provider Notes (Signed)
[facility]    CSN: [id number] Arrival date & time: 01/28/22  0851      History   Chief Complaint Chief Complaint  Patient presents with   Hip Pain    HPI Jacqueline [name] is a 71 y.o. female.   Presenting today with left hip and groin pain that began on Friday without known injury.  She states that it is a pulling and sharp at times pain and with certain movements it feels like a muscle is locking up.  She denies radiation of pain down the leg, numbness, tingling, weakness, swelling, discoloration, back pain, bowel or bladder incontinence, saddle anesthesia.  Has been trying some over-the-counter pain relievers with no relief.   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   Irritable bowel syndrome    Knee pain    Migraines    Migraines    Peripheral neuropathy    Pneumonia    Polysubstance abuse (HCC)    opiates, cocaine, marijuana   Status post mitral valve repair    Echo 5/19: Mild LVH, EF 60-65, normal wall motion, status post mitral valve repair (mean gradient 4), mild LAE, mildly reduced RVSF    Patient Active Problem List   Diagnosis Date Noted   Anemia 04/24/2021   Heme + stool 08/18/2018   Atrial myxoma    CAD (coronary artery disease) 04/21/2018   S/P mitral valve repair 04/02/2018   COPD with chronic bronchitis ([place]) 03/29/2018   History of atrial myxoma    Chronic pain syndrome 03/27/2018   Tobacco abuse 03/27/2018   HTN (hypertension) 03/27/2018   Constipation 10/06/2017   GERD  (gastroesophageal reflux disease) 09/26/2016   PUD (peptic ulcer disease) 10/01/2015   Nausea with vomiting 10/01/2015   Loss of weight 10/01/2015   Dizzy 07/16/2015   Major depressive disorder, recurrent, severe without psychotic features ([place])    Substance induced mood disorder ([place]) 04/24/2015   Opioid type dependence, continuous ([place]) 04/24/2015   Severe major depression without psychotic features ([place]) 04/24/2015   PTSD (post-traumatic stress disorder) 04/24/2015   Chest pain, localized 04/28/2012   Odynophagia 09/30/2011   Colon cancer screening 09/30/2011   Gastric tumor 09/30/2011   CLOSED FRACTURE OF UPPER END OF FIBULA 07/31/2010   Dysphagia 07/17/2009   ABDOMINAL PAIN, GENERALIZED 07/17/2009   GASTRITIS 07/12/2009   IBS 07/12/2009   RUQ PAIN 07/12/2009   EPIGASTRIC PAIN 07/12/2009   PANCREATITIS, ACUTE, HX OF 07/12/2009   Osteoarthrosis, unspecified whether generalized or localized, hand 11/09/2008   OSTEOARTHRITIS, LOWER LEG 10/25/2008   DERANGEMENT MENISCUS 10/16/2008   KNEE PAIN 10/16/2008    Past Surgical History:  Procedure Laterality Date   ABDOMINAL HYSTERECTOMY     ABDOMINAL SURGERY     BACK SURGERY     back surgery /ray cage fusion [place], [place]     BALLOON DILATION N/A 06/04/2021   Procedure: BALLOON DILATION;  Surgeon: Abbey Chatters,  Elon Alas, DO;  [facility];  Service: Endoscopy;  Laterality: N/A;   BIOPSY  07/31/2015   Procedure: BIOPSY (DUODENAL, GASTRIC, GASTRIC ULCER, ESOPHAGEAL);  Surgeon: Danie Binder, MD;  [facility];  Service: Endoscopy;;   CESAREAN SECTION     CHOLECYSTECTOMY     COLONOSCOPY  2001   Dr. Laural Golden, hemorrhoids   COLONOSCOPY  10/21/2011   [id number] polyp multiple/internal hemorrhoids   DILATION AND CURETTAGE OF UTERUS     ESOPHAGEAL DILATION N/A 07/31/2015   Procedure: ESOPHAGEAL DILATION WIRE GUIDED WITH SAVORY DILATORS 15MM, 16MM;  Surgeon: Danie Binder, MD;  [facility];  Service: Endoscopy;  Laterality:  N/A;   ESOPHAGOGASTRODUODENOSCOPY  11/2008   A 9-mm submucosal lesion seen in the cardia., gastritis, no hpylori   ESOPHAGOGASTRODUODENOSCOPY (EGD) WITH PROPOFOL N/A 07/31/2015   RCV:[id number] ulcer and moderate gastritis, dysphagia, empirical dilation with no identified source. esophageal, gastric, duodenal bx unremarkable.    ESOPHAGOGASTRODUODENOSCOPY (EGD) WITH PROPOFOL N/A 11/13/2015   SLF: 1. Gastric ulcer has healed. 2. single non-bleeding gastric AVMs 3. mild non-erosive gastritis.    ESOPHAGOGASTRODUODENOSCOPY (EGD) WITH PROPOFOL N/A 10/20/2017   Surgeon: Danie Binder, MD;   web in the upper third of the esophagus s/p dilation, gastritis due to NSAIDs.    ESOPHAGOGASTRODUODENOSCOPY (EGD) WITH PROPOFOL N/A 06/04/2021   Procedure: ESOPHAGOGASTRODUODENOSCOPY (EGD) WITH PROPOFOL;  Surgeon: Eloise Harman, DO;  [facility];  Service: Endoscopy;  Laterality: N/A;  7:30am   EXCISION OF ATRIAL MYXOMA N/A 04/01/2018   Procedure: RESECTION OF LEFT ATRIAL MYXOMA;  Surgeon: Melrose Nakayama, MD;  Location: [place];  Service: Open Heart Surgery;  Laterality: N/A;   FOOT SURGERY     GALLBLADDER SURGERY  2010   LEFT HEART CATH AND CORONARY ANGIOGRAPHY N/A 03/29/2018   Procedure: LEFT HEART CATH AND CORONARY ANGIOGRAPHY;  Surgeon: Lorretta Harp, MD;  Location: [place] [facility];  Service: Cardiovascular;  Laterality: N/A;   MITRAL VALVE REPAIR N/A 04/01/2018   Procedure: MITRAL VALVE REPAIR.;  Surgeon: Melrose Nakayama, MD;  Location: [place];  Service: Open Heart Surgery;  Laterality: N/A;  Mitral valve annuloplasty    PARTIAL GASTRECTOMY  1990   stomach tumor (large submucosal tumor felt to be be a myoma, but 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   SAVORY DILATION  02/03/2012   BPZ:WCHENIDPO in the distal esophagus/Mild gastritis   TEE WITHOUT CARDIOVERSION N/A 04/01/2018   Procedure: TRANSESOPHAGEAL  ECHOCARDIOGRAM (TEE);  Surgeon: Melrose Nakayama, MD;  Location: [place];  Service: Open Heart Surgery;  Laterality: N/A;   toe graft     TONSILLECTOMY      OB History     Gravida  3   Para  2   Term  2   Preterm      AB  1   Living  2      SAB  1   IAB      Ectopic      Multiple      Live Births               Home Medications    Prior to Admission medications   Medication Sig Start Date End Date Taking? Authorizing Provider  cyclobenzaprine (FLEXERIL) 10 MG tablet Take 1 tablet (10 mg total) by mouth 3 (three) times daily as needed for muscle spasms. Do not drink alcohol or drive while  taking this medication.  May cause drowsiness. 01/28/22  Yes Volney American, PA-C  ondansetron (ZOFRAN-ODT) 4 MG disintegrating tablet Take 1 tablet (4 mg total) by mouth every 8 (eight) hours as needed for nausea or vomiting. 01/28/22  Yes Volney American, PA-C  acetaminophen (TYLENOL) 500 MG tablet Take 500-1,000 mg by mouth every 6 (six) hours as needed for moderate pain.    [provider]  aspirin EC 81 MG tablet Take 1 tablet (81 mg total) by mouth daily. 07/22/18   Herminio Commons, MD  atorvastatin (LIPITOR) 40 MG tablet Take 1 tablet (40 mg total) by mouth daily at 6 PM. Patient taking differently: Take 40 mg by mouth daily. 06/07/18   Herminio Commons, MD  Carboxymethylcellul-Glycerin (LUBRICATING EYE DROPS OP) Place 1 drop into both eyes daily as needed (allergies/dry eyes).    [provider]  cetirizine (ZYRTEC) 10 MG tablet Take 10 mg by mouth daily.    [provider]  cyclobenzaprine (FLEXERIL) 10 MG tablet Take 10 mg by mouth 3 (three) times daily. 09/22/17   [provider]  diphenhydrAMINE (BENADRYL) 25 MG tablet Take 25-50 mg by mouth daily as needed for allergies or sleep.    [provider]  ferrous sulfate 325 (65 FE) MG EC tablet Take 325 mg by mouth See admin instructions. [id number] mg by mouth one  week then stops 1 week, then restarts for 1 week    [provider]  FLUoxetine (PROZAC) 40 MG capsule Take 40 mg by mouth daily.  09/22/17   [provider]  gabapentin (NEURONTIN) 300 MG capsule Take 2 capsules (600 mg total) by mouth 3 (three) times daily. 04/30/15   Niel Hummer, NP  omeprazole (PRILOSEC) 40 MG capsule TAKE 1 CAPSULE(40 MG) BY MOUTH TWICE DAILY BEFORE A MEAL 01/08/22   Mahala Menghini, PA-C  ondansetron (ZOFRAN) 4 MG tablet Take 1 tablet (4 mg total) by mouth every 8 (eight) hours as needed for nausea or vomiting. 05/07/21   Erenest Rasher, PA-C  sodium chloride (OCEAN) 0.65 % SOLN nasal spray Place 1-2 sprays into both nostrils as needed for congestion.    [provider]  SUBOXONE 8-2 MG FILM Place 1 tablet under the tongue in the morning, at noon, and at bedtime. 04/20/18   [provider]    Family History Family History  Problem Relation Age of Onset   Colon cancer Other    Colon cancer Mother        diagnosed in late 13s and died age 65   Heart failure Mother    Heart defect Other        family history    Arthritis Other        family history   COPD Other        family history    Cancer Other        multiple unknown type   Heart attack Father        deceased at 72   Hypertension Father    Heart failure Father    Lung cancer Maternal Uncle    Throat cancer Maternal Uncle    Colon cancer Paternal Uncle    Colon cancer Maternal Aunt    Anesthesia problems Neg Hx    Hypotension Neg Hx    Malignant hyperthermia Neg Hx    Pseudochol deficiency Neg Hx     Social History Social History   Tobacco Use   Smoking status: Every  Day    Packs/day: 0.30    Years: 35.00    Pack years: 10.50    Types: Cigarettes   Smokeless tobacco: Never  Vaping Use   Vaping Use: Never used  Substance Use Topics   Alcohol use: Not Currently    Comment: rare   Drug use: Yes    Frequency: 1.0 times per week    Types: Marijuana     Comment: occ marijuana     Allergies   Aspirin, Ketorolac tromethamine, Nsaids, Sulfonamide derivatives, Imitrex [sumatriptan base], and Promethazine hcl  Review of Systems Review of Systems Per HPI  Physical Exam Triage Vital Signs ED Triage Vitals  Enc Vitals Group     BP 01/28/22 0914 133/83     Pulse Rate 01/28/22 0914 85     Resp 01/28/22 0914 20     Temp 01/28/22 0914 98.9 F (37.2 C)     Temp src --      SpO2 01/28/22 0914 95 %     Weight --      Height --      Head Circumference --      Peak Flow --      Pain Score 01/28/22 0911 6     Pain Loc --      Pain Edu? --      Excl. in [place]? --    No data found.  Updated Vital Signs BP 133/83    Pulse 85    Temp 98.9 F (37.2 C)    Resp 20    SpO2 95%   Visual Acuity Right Eye Distance:   Left Eye Distance:   Bilateral Distance:    Right Eye Near:   Left Eye Near:    Bilateral Near:     Physical Exam Vitals and nursing note reviewed.  Constitutional:      Appearance: Normal appearance. She is not ill-appearing.  HENT:     Head: Atraumatic.  Eyes:     Extraocular Movements: Extraocular movements intact.     Conjunctiva/sclera: Conjunctivae normal.  Cardiovascular:     Rate and Rhythm: Normal rate and regular rhythm.     Heart sounds: Normal heart sounds.  Pulmonary:     Effort: Pulmonary effort is normal.     Breath sounds: Normal breath sounds.  Musculoskeletal:        General: Tenderness present. No swelling, deformity or signs of injury. Normal range of motion.     Cervical back: Normal range of motion and neck supple.     Comments: Tenderness to palpation left groin region extending toward hip and left quadricep.  Range of motion full and intact  Skin:    General: Skin is warm and dry.     Findings: No bruising or erythema.  Neurological:     Mental Status: She is alert and oriented to person, place, and time.     Sensory: No sensory deficit.     Motor: No weakness.     Gait: Gait normal.      Comments: Left lower extremity neurovascularly intact  Psychiatric:        Mood and Affect: Mood normal.        Thought Content: Thought content normal.        Judgment: Judgment normal.   UC Treatments / Results  Labs (all labs ordered are listed, but only abnormal results are displayed) Labs Reviewed - No data to display  EKG  Radiology No results found.  Procedures Procedures (including critical care  time)  Medications Ordered in UC Medications  dexamethasone (DECADRON) injection 10 mg (10 mg Intramuscular Given 01/28/22 0949)    Initial Impression / Assessment and Plan / UC Course  I have reviewed the triage vital signs and the nursing notes.  Pertinent labs & imaging results that were available during my care of the patient were reviewed by me and considered in my medical decision making (see chart for details).     Treat with Flexeril, over-the-counter pain regimen, stretches, heat.  She is also requesting a Zofran refill for as needed nausea as she likes to keep some on hand.  Follow-up for worsening symptoms.  Final Clinical Impressions(s) / UC Diagnoses   Final diagnoses:  Strain of left groin   Discharge Instructions   None    ED Prescriptions     Medication Sig Dispense Auth. Provider   ondansetron (ZOFRAN-ODT) 4 MG disintegrating tablet Take 1 tablet (4 mg total) by mouth every 8 (eight) hours as needed for nausea or vomiting. 20 tablet Volney American, [place]   cyclobenzaprine (FLEXERIL) 10 MG tablet Take 1 tablet (10 mg total) by mouth 3 (three) times daily as needed for muscle spasms. Do not drink alcohol or drive while taking this medication.  May cause drowsiness. 15 tablet Volney American, [place]      PDMP not reviewed this encounter.   Volney American, [place] 01/28/22 1944

## 2022-01-28 NOTE — ED Triage Notes (Signed)
Pt presents with left hip pain that radiates into groin, began Friday, denies injury

## 2022-02-06 NOTE — Progress Notes (Signed)
Date:  02/12/2022   ID:  Jacqueline [name], DOB [birth year], MRN [id number]   PCP:  Neale Burly, MD  Cardiologist:  New to me Previously Dr Jacinta Shoe  Electrophysiologist:  None   Evaluation Performed:  Follow-Up Visit  Chief Complaint: Mitral valve repair  History of Present Illness:    Jacqueline [name] is a 71 y.o. female with a history of mitral valve repair and atrial myxoma.  She was hospitalized in April 2019 at [facility] for chest pain and shortness of breath and found to be CHF  Echocardiogram demonstrated a large left atrial mass suspicious for atrial myxoma.  Cardiac catheterization demonstrated no significant coronary artery disease with a proximal 40% LAD stenosis.  She was evaluated by cardiothoracic surgery and underwent resection for atrial myxoma with atrial septal patch and mitral valve repair on 04/01/2018.  She was placed on warfarin for antithrombotic therapy with a plan to remain on it for 3 months with a goal INR of 2.5 followed by aspirin.  She has chronic exertional dyspnea and smokes 1 ppd.  Echo 05/20/18 showed EF 60-65% stable MV repair no MR mean diastolic  Gradient 4 mmHg mild LAE and intact septum   Has had 3 Moderna vaccines Wants Ativan for anxiety Has tried Chantix in past and did not help with smoking cessation   Seen in ED 01/28/22 for left hip pain Rx flexeril and OTC pain meds Had her Zofran renewed   Trying to get her own apartment Does not have a car After stress of moving over may Get some patches to help quit smoking Currently 1 ppd   Past Medical History:  Diagnosis Date   Anemia    Anxiety    Arthritis    Atrial myxoma    Left atrial   CAD (coronary artery disease) 04/21/2018   LHC 4/19: no sig CAD, pLAD 40   Chronic back pain    Drug-seeking behavior    Gastric nodule 2009   EUS, ?leiomyoma   Gastric tumor 1992   Large submucosal tumor felt to be Leiomyoma, 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   Irritable bowel syndrome    Knee pain    Migraines    Migraines    Peripheral neuropathy    Pneumonia    Polysubstance abuse (HCC)    opiates, cocaine, marijuana   Status post mitral valve repair    Echo 5/19: Mild LVH, EF 60-65, normal wall motion, status post mitral valve repair (mean gradient 4), mild LAE, mildly reduced RVSF   Past Surgical History:  Procedure Laterality Date   ABDOMINAL HYSTERECTOMY     ABDOMINAL SURGERY     BACK SURGERY     back surgery /ray cage fusion [place], gso     BALLOON DILATION N/A 06/04/2021   Procedure: BALLOON DILATION;  Surgeon: Eloise Harman, DO;  [facility];  Service: Endoscopy;  Laterality: N/A;   BIOPSY  07/31/2015   Procedure: BIOPSY (DUODENAL, GASTRIC, GASTRIC ULCER, ESOPHAGEAL);  Surgeon: Danie Binder, MD;  [facility]: [facility];  Service: Endoscopy;;   CESAREAN SECTION     CHOLECYSTECTOMY     COLONOSCOPY  2001   Dr. Laural Golden, hemorrhoids   COLONOSCOPY  10/21/2011   ZMO:[id number] polyp multiple/internal hemorrhoids   DILATION AND CURETTAGE OF UTERUS     ESOPHAGEAL  DILATION N/A 07/31/2015   Procedure: ESOPHAGEAL DILATION WIRE GUIDED WITH SAVORY DILATORS 15MM, 16MM;  Surgeon: Danie Binder, MD;  [facility];  Service: Endoscopy;  Laterality: N/A;   ESOPHAGOGASTRODUODENOSCOPY  11/2008   A 9-mm submucosal lesion seen in the cardia., gastritis, no hpylori   ESOPHAGOGASTRODUODENOSCOPY (EGD) WITH PROPOFOL N/A 07/31/2015   [id number] ulcer and moderate gastritis, dysphagia, empirical dilation with no identified source. esophageal, gastric, duodenal bx unremarkable.    ESOPHAGOGASTRODUODENOSCOPY (EGD) WITH PROPOFOL N/A 11/13/2015   SLF: 1. Gastric ulcer has healed. 2. single non-bleeding gastric AVMs 3. mild non-erosive gastritis.    ESOPHAGOGASTRODUODENOSCOPY (EGD) WITH PROPOFOL N/A 10/20/2017   Surgeon: Danie Binder, MD;   web in the  upper third of the esophagus s/p dilation, gastritis due to NSAIDs.    ESOPHAGOGASTRODUODENOSCOPY (EGD) WITH PROPOFOL N/A 06/04/2021   Procedure: ESOPHAGOGASTRODUODENOSCOPY (EGD) WITH PROPOFOL;  Surgeon: Eloise Harman, DO;  [facility];  Service: Endoscopy;  Laterality: N/A;  7:30am   EXCISION OF ATRIAL MYXOMA N/A 04/01/2018   Procedure: RESECTION OF LEFT ATRIAL MYXOMA;  Surgeon: Melrose Nakayama, MD;  Location: [place];  Service: Open Heart Surgery;  Laterality: N/A;   FOOT SURGERY     GALLBLADDER SURGERY  2010   LEFT HEART CATH AND CORONARY ANGIOGRAPHY N/A 03/29/2018   Procedure: LEFT HEART CATH AND CORONARY ANGIOGRAPHY;  Surgeon: Lorretta Harp, MD;  Location: [place] [facility];  Service: Cardiovascular;  Laterality: N/A;   MITRAL VALVE REPAIR N/A 04/01/2018   Procedure: MITRAL VALVE REPAIR.;  Surgeon: Melrose Nakayama, MD;  Location: [place];  Service: Open Heart Surgery;  Laterality: N/A;  Mitral valve annuloplasty    PARTIAL GASTRECTOMY  1990   stomach tumor (large submucosal tumor felt to be be a myoma, but 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   SAVORY DILATION  02/03/2012   [id number] in the distal esophagus/Mild gastritis   TEE WITHOUT CARDIOVERSION N/A 04/01/2018   Procedure: TRANSESOPHAGEAL ECHOCARDIOGRAM (TEE);  Surgeon: Melrose Nakayama, MD;  Location: [place];  Service: Open Heart Surgery;  Laterality: N/A;   toe graft     TONSILLECTOMY       Current Meds  Medication Sig   acetaminophen (TYLENOL) 500 MG tablet Take 500-1,000 mg by mouth every 6 (six) hours as needed for moderate pain.   aspirin EC 81 MG tablet Take 1 tablet (81 mg total) by mouth daily.   atorvastatin (LIPITOR) 40 MG tablet Take 1 tablet (40 mg total) by mouth daily at 6 PM. (Patient taking differently: Take 40 mg by mouth daily.)   Carboxymethylcellul-Glycerin (LUBRICATING EYE DROPS OP) Place 1 drop into both eyes  daily as needed (allergies/dry eyes).   cetirizine (ZYRTEC) 10 MG tablet Take 10 mg by mouth daily.   cyclobenzaprine (FLEXERIL) 10 MG tablet Take 10 mg by mouth 3 (three) times daily.   diphenhydrAMINE (BENADRYL) 25 MG tablet Take 25-50 mg by mouth daily as needed for allergies or sleep.   ferrous sulfate 325 (65 FE) MG EC tablet Take 325 mg by mouth See admin instructions. [id number] mg by mouth one week then stops 1 week, then restarts for 1 week   FLUoxetine (PROZAC) 40 MG capsule Take 40 mg by mouth daily.    gabapentin (NEURONTIN) 300 MG capsule Take 2 capsules (600 mg total) by mouth 3 (three) times daily.   omeprazole (PRILOSEC) 40 MG capsule TAKE 1 CAPSULE(40 MG)  BY MOUTH TWICE DAILY BEFORE A MEAL   ondansetron (ZOFRAN) 4 MG tablet Take 1 tablet (4 mg total) by mouth every 8 (eight) hours as needed for nausea or vomiting.   SUBOXONE 8-2 MG FILM Place 1 tablet under the tongue in the morning, at noon, and at bedtime.     Allergies:   Aspirin, Ketorolac tromethamine, Nsaids, Sulfonamide derivatives, Imitrex [sumatriptan base], and Promethazine hcl   Social History   Tobacco Use   Smoking status: Every Day    Packs/day: 0.30    Years: 35.00    Pack years: 10.50    Types: Cigarettes   Smokeless tobacco: Never  Vaping Use   Vaping Use: Never used  Substance Use Topics   Alcohol use: Not Currently    Comment: rare   Drug use: Yes    Frequency: 1.0 times per week    Types: Marijuana    Comment: occ marijuana     Family Hx: The patient's family history includes Arthritis in an other family member; COPD in an other family member; Cancer in an other family member; Colon cancer in her maternal aunt, mother, paternal uncle, and another family member; Heart attack in her father; Heart defect in an other family member; Heart failure in her father and mother; Hypertension in her father; Lung cancer in her maternal uncle; Throat cancer in her maternal uncle. There is no history of Anesthesia  problems, Hypotension, Malignant hyperthermia, or Pseudochol deficiency.  ROS:   Please see the history of present illness.     All other systems reviewed and are negative.   Prior CV studies:   The following studies were reviewed today:  Echocardiogram 05/20/2018:  - Left ventricle: The cavity size was normal. Wall thickness was   increased in a pattern of mild LVH. Systolic function was normal.   The estimated ejection fraction was in the range of 60% to 65%.   Wall motion was normal; there were no regional wall motion   abnormalities. Left ventricular diastolic function parameters   were normal. - Mitral valve: S/p mitral valve repair. - Left atrium: The atrium was mildly dilated. - Right ventricle: Systolic function was mildly reduced.  Labs/Other Tests and Data Reviewed:    EKG:   SR PaCls inferior T wave changes 09/06/18 02/12/2022 SR rate 85 ICRBBB no changes   Recent Labs: 05/29/2021: Hemoglobin 12.4; Platelets 276 06/03/2021: BUN 6; Creatinine, Ser 0.59; Potassium 3.3; Sodium 138   Recent Lipid Panel Lab Results  Component Value Date/Time   CHOL 204 (H) 03/31/2018 02:33 AM   TRIG 55 03/31/2018 02:33 AM   HDL 60 03/31/2018 02:33 AM   CHOLHDL 3.4 03/31/2018 02:33 AM   LDLCALC 133 (H) 03/31/2018 02:33 AM    Wt Readings from Last 3 Encounters:  02/12/22 124 lb 6.4 oz (56.4 kg)  06/04/21 118 lb (53.5 kg)  06/03/21 118 lb (53.5 kg)     Objective:    Vital Signs:  BP 100/68    Pulse 85    Ht 5\' 2"  (1.575 m)    Wt 124 lb 6.4 oz (56.4 kg)    SpO2 97%    BMI 22.75 kg/m    Affect appropriate Healthy:  appears stated age 10: normal Neck supple with no adenopathy JVP normal no bruits no thyromegaly Lungs clear with no wheezing and good diaphragmatic motion Heart:  S1/S2 no murmur, no rub, gallop or click PMI normal post median sternotomy  Abdomen: benighn, BS positve, no tenderness, no AAA no  bruit.  No HSM or HJR Distal pulses intact with no bruits No  edema Neuro non-focal Skin warm and dry No muscular weakness   ASSESSMENT & PLAN:    1.  History of atrial myxoma: Status post resection 04/02/18 [facility] . Stable.   2.  Status post mitral valve repair: TTE  2019 no residual MR update echo . Continue SBE prophylaxis    3.  Coronary artery disease: Mild nonobstructive disease in the proximal LAD (40%).  She is on atorvastatin.   4.  Tobacco abuse: She smokes 1 ppd. Counseled on cessation < 10 minutes Discuss Ativan with primary  As well as lung cancer screening CT   5. GERD/Nausea: on omeprazole and zofran EGD June 2022 [place] with esophageal dilatation and no mucosal lesions     Medication Adjustments/Labs and Tests Ordered: Current medicines are reviewed at length with the patient today.  Concerns regarding medicines are outlined above.   Tests Ordered: No orders of the defined types were placed in this encounter. None   Medication Changes: No orders of the defined types were placed in this encounter.  Lung cancer screening CT Echo for myxoma /MVR  Follow Up:  1 year   Signed, Jenkins Rouge, MD  02/12/2022 8:40 AM    [place]

## 2022-02-12 ENCOUNTER — Other Ambulatory Visit: Payer: Self-pay

## 2022-02-12 ENCOUNTER — Ambulatory Visit (INDEPENDENT_AMBULATORY_CARE_PROVIDER_SITE_OTHER): Payer: Medicare Other | Admitting: Cardiovascular Disease

## 2022-02-12 ENCOUNTER — Encounter: Payer: Self-pay | Admitting: Cardiovascular Disease

## 2022-02-12 ENCOUNTER — Encounter: Payer: Medicare Other | Admitting: Obstetrics & Gynecology

## 2022-02-12 VITALS — BP 100/68 | HR 85 | Ht 62.0 in | Wt 124.4 lb

## 2022-02-12 DIAGNOSIS — Z9889 Other specified postprocedural states: Secondary | ICD-10-CM

## 2022-02-12 DIAGNOSIS — Z86018 Personal history of other benign neoplasm: Secondary | ICD-10-CM | POA: Diagnosis not present

## 2022-02-12 DIAGNOSIS — F17200 Nicotine dependence, unspecified, uncomplicated: Secondary | ICD-10-CM

## 2022-02-12 NOTE — Patient Instructions (Signed)
Medication Instructions:  Your physician recommends that you continue on your current medications as directed. Please refer to the Current Medication list given to you today.  *If you need a refill on your cardiac medications before your next appointment, please call your pharmacy*   Lab Work: NONE   If you have labs (blood work) drawn today and your tests are completely normal, you will receive your results only by: [place] (if you have MyChart) OR A paper copy in the mail If you have any lab test that is abnormal or we need to change your treatment, we will call you to review the results.   Testing/Procedures: Your physician has requested that you have an echocardiogram. Echocardiography is a painless test that uses sound waves to create images of your heart. It provides your doctor with information about the size and shape of your heart and how well your hearts chambers and valves are working. This procedure takes approximately one hour. There are no restrictions for this procedure.  Lung cancer Screening CT   Follow-Up: At [facility], you and your health needs are our priority.  As part of our continuing mission to provide you with exceptional heart care, we have created designated Provider Care Teams.  These Care Teams include your primary Cardiologist (physician) and Advanced Practice Providers (APPs -  Physician Assistants and Nurse Practitioners) who all work together to provide you with the care you need, when you need it.  We recommend signing up for the patient portal called "MyChart".  Sign up information is provided on this After Visit Summary.  MyChart is used to connect with patients for Virtual Visits (Telemedicine).  Patients are able to view lab/test results, encounter notes, upcoming appointments, etc.  Non-urgent messages can be sent to your provider as well.   To learn more about what you can do with MyChart, go to [email or web address].    Your next  appointment:   1 year(s)  The format for your next appointment:   In Person  Provider:   Jenkins Rouge, MD    Other Instructions Thank you for choosing [place]!

## 2022-02-17 DIAGNOSIS — M25552 Pain in left hip: Secondary | ICD-10-CM | POA: Diagnosis not present

## 2022-02-17 DIAGNOSIS — J40 Bronchitis, not specified as acute or chronic: Secondary | ICD-10-CM | POA: Diagnosis not present

## 2022-02-17 DIAGNOSIS — Z720 Tobacco use: Secondary | ICD-10-CM | POA: Diagnosis not present

## 2022-02-17 DIAGNOSIS — M1990 Unspecified osteoarthritis, unspecified site: Secondary | ICD-10-CM | POA: Diagnosis not present

## 2022-02-17 DIAGNOSIS — F17200 Nicotine dependence, unspecified, uncomplicated: Secondary | ICD-10-CM | POA: Diagnosis not present

## 2022-02-25 ENCOUNTER — Ambulatory Visit: Payer: Medicare Other | Admitting: Orthopedic Surgery

## 2022-03-06 ENCOUNTER — Encounter: Payer: Medicare Other | Admitting: Obstetrics & Gynecology

## 2022-03-07 ENCOUNTER — Ambulatory Visit (HOSPITAL_COMMUNITY): Admission: RE | Admit: 2022-03-07 | Payer: Medicare Other | Source: Ambulatory Visit

## 2022-03-20 ENCOUNTER — Other Ambulatory Visit: Payer: Medicare Other | Admitting: Obstetrics & Gynecology

## 2022-03-24 ENCOUNTER — Ambulatory Visit (INDEPENDENT_AMBULATORY_CARE_PROVIDER_SITE_OTHER): Payer: Medicare Other | Admitting: Orthopedic Surgery

## 2022-03-24 ENCOUNTER — Encounter: Payer: Self-pay | Admitting: Orthopedic Surgery

## 2022-03-24 ENCOUNTER — Ambulatory Visit: Payer: Medicare Other

## 2022-03-24 VITALS — BP 170/111 | HR 95 | Ht 62.0 in | Wt 124.4 lb

## 2022-03-24 DIAGNOSIS — M1612 Unilateral primary osteoarthritis, left hip: Secondary | ICD-10-CM | POA: Diagnosis not present

## 2022-03-24 DIAGNOSIS — M25552 Pain in left hip: Secondary | ICD-10-CM

## 2022-03-24 NOTE — Patient Instructions (Signed)
Central Scheduling for [facility] ?[phone]  ? ?Call and make your appointment. I will place the order by the end of the day.  ? ?Follow up with Dr. Aline Brochure in 3 months.  ?

## 2022-03-24 NOTE — Progress Notes (Signed)
NEW PROBLEM//OFFICE VISIT ? ? ?Chief Complaint  ?Patient presents with  ? Hip Pain  ?  LEFT hip painful x 1 month. Pt states she feels like it "catches" sometimes especially if she is turning. Pain radiates down into left leg.  ?HX of surgery L4-L5, L5-S1 in 91 or 92. Dr. Liliane Channel [place]  ? ?This is a 71 year old female presents to [place] with 6-week history of left groin pain and catching.  She did decrease her activities and took a muscle relaxer and thinks that it may have helped ? ?She is a smoker she is ready to quit she is not diabetic does not have hypertension takes aspirin but no other things that within the blood she has anxiety arthritis bronchitis depression GERD migraines osteoporosis pneumonia history of ulcers ? ?Complains of occasional cough with sputum production back and joint pain the joint pain being in the hip and the depression all the other systems were reviewed and were negative ? ? ? ? ?BP (!) 170/111   Pulse 95   Ht '5\' 2"'$  (1.575 m)   Wt 124 lb 6.4 oz (56.4 kg)   BMI 22.75 kg/m?  ? ?Body mass index is 22.75 kg/m?. ? ?General appearance: Well-developed well-nourished no gross deformities ? ?Cardiovascular normal pulse and perfusion normal color without edema extensive varicosities lower extremities ? ?Neurologically no sensation loss or deficits or pathologic reflexes ? ?Psychological: Awake alert and oriented x3 mood and affect normal ? ?Skin no lacerations or ulcerations no nodularity no palpable masses, no erythema or nodularity ? ?Musculoskeletal: Examination shows that the left leg may be slightly shorter than the right she has pain with flexion internal rotation left hip right hip normal ? ?She does have extensive varicosities in her lower extremities ? ? ?Past Medical History:  ?Diagnosis Date  ? Anemia   ? Anxiety   ? Arthritis   ? Atrial myxoma   ? Left atrial  ? CAD (coronary artery disease) 04/21/2018  ? LHC 4/19: no sig CAD, pLAD 40  ? Chronic back pain   ? Drug-seeking behavior   ?  Gastric nodule 2009  ? EUS, ?leiomyoma  ? Gastric tumor 1992  ? 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 GERD (gastroesophageal reflux disease)   ? Gout   ? History of pancreatitis   ? Biliary and/or etoh?  ? Irritable bowel syndrome   ? Knee pain   ? Migraines   ? Migraines   ? Peripheral neuropathy   ? Pneumonia   ? Polysubstance abuse ([place])   ? opiates, cocaine, marijuana  ? Status post mitral valve repair   ? Echo 5/19: Mild LVH, EF 60-65, normal wall motion, status post mitral valve repair (mean gradient 4), mild LAE, mildly reduced RVSF  ? ? ?Past Surgical History:  ?Procedure Laterality Date  ? ABDOMINAL HYSTERECTOMY    ? ABDOMINAL SURGERY    ? BACK SURGERY    ? back surgery /ray cage fusion [place], gso    ? BALLOON DILATION N/A 06/04/2021  ? Procedure: BALLOON DILATION;  Surgeon: Eloise Harman, DO;  [facility];  Service: Endoscopy;  Laterality: N/A;  ? BIOPSY  07/31/2015  ? Procedure: BIOPSY (DUODENAL, GASTRIC, GASTRIC ULCER, ESOPHAGEAL);  Surgeon: Danie Binder, MD;  [facility];  Service: Endoscopy;;  ? CESAREAN SECTION    ? CHOLECYSTECTOMY    ? COLONOSCOPY  2001  ? Dr. Laural Golden, hemorrhoids  ? COLONOSCOPY  10/21/2011  ? [id number] polyp multiple/internal hemorrhoids  ? DILATION AND CURETTAGE OF UTERUS    ? ESOPHAGEAL DILATION N/A 07/31/2015  ? Procedure: ESOPHAGEAL DILATION WIRE GUIDED WITH SAVORY DILATORS 15MM, 16MM;  Surgeon: Danie Binder, MD;  [facility];  Service: Endoscopy;  Laterality: N/A;  ? ESOPHAGOGASTRODUODENOSCOPY  11/2008  ? A 9-mm submucosal lesion seen in the cardia., gastritis, no hpylori  ? ESOPHAGOGASTRODUODENOSCOPY (EGD) WITH PROPOFOL N/A 07/31/2015  ? [id number] ulcer and moderate gastritis, dysphagia, empirical dilation with no identified source. esophageal, gastric, duodenal bx unremarkable.   ? ESOPHAGOGASTRODUODENOSCOPY (EGD) WITH PROPOFOL N/A 11/13/2015  ? SLF: 1. Gastric ulcer has healed.  2. single non-bleeding gastric AVMs 3. mild non-erosive gastritis.   ? ESOPHAGOGASTRODUODENOSCOPY (EGD) WITH PROPOFOL N/A 10/20/2017  ? Surgeon: Danie Binder, MD;   web in the upper third of the esophagus s/p dilation, gastritis due to NSAIDs.   ? ESOPHAGOGASTRODUODENOSCOPY (EGD) WITH PROPOFOL N/A 06/04/2021  ? Procedure: ESOPHAGOGASTRODUODENOSCOPY (EGD) WITH PROPOFOL;  Surgeon: Eloise Harman, DO;  [facility];  Service: Endoscopy;  Laterality: N/A;  7:30am  ? EXCISION OF ATRIAL MYXOMA N/A 04/01/2018  ? Procedure: RESECTION OF LEFT ATRIAL MYXOMA;  Surgeon: Melrose Nakayama, MD;  Location: [place];  Service: Open Heart Surgery;  Laterality: N/A;  ? FOOT SURGERY    ? GALLBLADDER SURGERY  2010  ? LEFT HEART CATH AND CORONARY ANGIOGRAPHY N/A 03/29/2018  ? Procedure: LEFT HEART CATH AND CORONARY ANGIOGRAPHY;  Surgeon: Lorretta Harp, MD;  Location: [place] [facility];  Service: Cardiovascular;  Laterality: N/A;  ? MITRAL VALVE REPAIR N/A 04/01/2018  ? Procedure: MITRAL VALVE REPAIR.;  Surgeon: Melrose Nakayama, MD;  Location: [place];  Service: Open Heart Surgery;  Laterality: N/A;  Mitral valve annuloplasty   ? PARTIAL GASTRECTOMY  1990  ? 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  ? SAVORY DILATION  02/03/2012  ? [id number] in the distal esophagus/Mild gastritis  ? TEE WITHOUT CARDIOVERSION N/A 04/01/2018  ? Procedure: TRANSESOPHAGEAL ECHOCARDIOGRAM (TEE);  Surgeon: Melrose Nakayama, MD;  Location: [place];  Service: Open Heart Surgery;  Laterality: N/A;  ? toe graft    ? TONSILLECTOMY    ? ? ?Family History  ?Problem Relation Age of Onset  ? Colon cancer Other   ? Colon cancer Mother   ?     diagnosed in late 67s and died age 15  ? Heart failure Mother   ? Heart defect Other   ?     family history   ? Arthritis Other   ?     family history  ? COPD Other   ?      family history   ? Cancer Other   ?     multiple unknown type  ? Heart attack Father   ?     deceased at 30  ? Hypertension Father   ? Heart failure Father   ? Lung cancer Maternal Uncle   ? Throat cancer Maternal Uncle   ? Colon cancer Paternal Uncle   ? Colon cancer Maternal Aunt   ? Anesthesia problems Neg Hx   ? Hypotension Neg Hx   ? Malignant hyperthermia Neg Hx   ? Pseudochol deficiency Neg Hx   ? ?Social History  ? ?Tobacco Use  ? Smoking status: Every Day  ?  Packs/day:  0.30  ?  Years: 35.00  ?  Pack years: 10.50  ?  Types: Cigarettes  ? Smokeless tobacco: Never  ?Vaping Use  ? Vaping Use: Never used  ?Substance Use Topics  ? Alcohol use: Not Currently  ?  Comment: rare  ? Drug use: Yes  ?  Frequency: 1.0 times per week  ?  Types: Marijuana  ?  Comment: occ marijuana  ? ? ?Allergies  ?Allergen Reactions  ? Aspirin Other (See Comments)  ?  Has had part of stomach removed, cannot take large doses of aspirin ?  ? Ketorolac Tromethamine Other (See Comments)  ?  NSAID > NOT TO TAKE ASA NOR NSAIDS DUE TO GASTRECTOMY] ?"makes my hands draw up"  ? Nsaids Other (See Comments)  ?  Due to stomach issues (has had part of stomach removed, cannot take NSAIDS OR ASA)  ? Sulfonamide Derivatives Nausea And Vomiting and Other (See Comments)  ?  SYNCOPE > LOSS CONSCIOUSNESS   ? Imitrex [Sumatriptan Base] Palpitations  ?  Fast heart rate  ? Promethazine Hcl Other (See Comments)  ?  Legs jerking -" like restless legs"  ? ? ?Current Meds  ?Medication Sig  ? acetaminophen (TYLENOL) 500 MG tablet Take 500-1,000 mg by mouth every 6 (six) hours as needed for moderate pain.  ? aspirin EC 81 MG tablet Take 1 tablet (81 mg total) by mouth daily.  ? atorvastatin (LIPITOR) 40 MG tablet Take 1 tablet (40 mg total) by mouth daily at 6 PM. (Patient taking differently: Take 40 mg by mouth daily.)  ? cetirizine (ZYRTEC) 10 MG tablet Take 10 mg by mouth daily.  ? cyclobenzaprine (FLEXERIL) 10 MG tablet Take 10 mg by mouth 3 (three) times  daily.  ? diphenhydrAMINE (BENADRYL) 25 MG tablet Take 25-50 mg by mouth daily as needed for allergies or sleep.  ? ferrous sulfate 325 (65 FE) MG EC tablet Take 325 mg by mouth See admin instructions. [id number] m

## 2022-03-25 ENCOUNTER — Ambulatory Visit (HOSPITAL_COMMUNITY): Payer: Medicare Other

## 2022-03-25 DIAGNOSIS — M25552 Pain in left hip: Secondary | ICD-10-CM | POA: Diagnosis not present

## 2022-03-25 DIAGNOSIS — F17200 Nicotine dependence, unspecified, uncomplicated: Secondary | ICD-10-CM | POA: Diagnosis not present

## 2022-03-25 DIAGNOSIS — M1990 Unspecified osteoarthritis, unspecified site: Secondary | ICD-10-CM | POA: Diagnosis not present

## 2022-03-31 ENCOUNTER — Ambulatory Visit (HOSPITAL_COMMUNITY): Admission: RE | Admit: 2022-03-31 | Payer: Medicare Other | Source: Ambulatory Visit

## 2022-03-31 DIAGNOSIS — F17200 Nicotine dependence, unspecified, uncomplicated: Secondary | ICD-10-CM | POA: Diagnosis not present

## 2022-03-31 DIAGNOSIS — M1990 Unspecified osteoarthritis, unspecified site: Secondary | ICD-10-CM | POA: Diagnosis not present

## 2022-03-31 DIAGNOSIS — M25552 Pain in left hip: Secondary | ICD-10-CM | POA: Diagnosis not present

## 2022-04-07 ENCOUNTER — Other Ambulatory Visit: Payer: Self-pay | Admitting: Orthopedic Surgery

## 2022-04-07 DIAGNOSIS — M1612 Unilateral primary osteoarthritis, left hip: Secondary | ICD-10-CM

## 2022-04-07 DIAGNOSIS — M25552 Pain in left hip: Secondary | ICD-10-CM | POA: Diagnosis not present

## 2022-04-07 DIAGNOSIS — M1990 Unspecified osteoarthritis, unspecified site: Secondary | ICD-10-CM | POA: Diagnosis not present

## 2022-04-11 ENCOUNTER — Telehealth: Payer: Self-pay

## 2022-04-11 NOTE — Telephone Encounter (Signed)
Patient left message to call her. I called and she stated she was in a lot of pain. ?I told her she was suppose to have an appointment for an injection in her hip on Monday ?and that it looks like she has canceled it. She stated she had forgotten about the appointment, ?and I explained that it was scheduled for Monday 04/14/22, but she had canceled it and suggested that she really needs to ?call back and have that appointment rescheduled. She thanked me and hung up. ?

## 2022-04-14 ENCOUNTER — Encounter: Payer: Medicare Other | Admitting: Physical Medicine and Rehabilitation

## 2022-04-14 DIAGNOSIS — M25552 Pain in left hip: Secondary | ICD-10-CM | POA: Diagnosis not present

## 2022-04-14 DIAGNOSIS — M1990 Unspecified osteoarthritis, unspecified site: Secondary | ICD-10-CM | POA: Diagnosis not present

## 2022-04-21 DIAGNOSIS — M1990 Unspecified osteoarthritis, unspecified site: Secondary | ICD-10-CM | POA: Diagnosis not present

## 2022-04-21 DIAGNOSIS — M25552 Pain in left hip: Secondary | ICD-10-CM | POA: Diagnosis not present

## 2022-04-21 DIAGNOSIS — F17200 Nicotine dependence, unspecified, uncomplicated: Secondary | ICD-10-CM | POA: Diagnosis not present

## 2022-04-28 DIAGNOSIS — M1990 Unspecified osteoarthritis, unspecified site: Secondary | ICD-10-CM | POA: Diagnosis not present

## 2022-04-28 DIAGNOSIS — F17200 Nicotine dependence, unspecified, uncomplicated: Secondary | ICD-10-CM | POA: Diagnosis not present

## 2022-04-28 DIAGNOSIS — J40 Bronchitis, not specified as acute or chronic: Secondary | ICD-10-CM | POA: Diagnosis not present

## 2022-04-28 DIAGNOSIS — M25552 Pain in left hip: Secondary | ICD-10-CM | POA: Diagnosis not present

## 2022-05-02 ENCOUNTER — Ambulatory Visit (HOSPITAL_COMMUNITY): Admission: RE | Admit: 2022-05-02 | Payer: Medicare Other | Source: Ambulatory Visit

## 2022-05-05 DIAGNOSIS — E7849 Other hyperlipidemia: Secondary | ICD-10-CM | POA: Diagnosis not present

## 2022-05-05 DIAGNOSIS — K219 Gastro-esophageal reflux disease without esophagitis: Secondary | ICD-10-CM | POA: Diagnosis not present

## 2022-05-05 DIAGNOSIS — Z6823 Body mass index (BMI) 23.0-23.9, adult: Secondary | ICD-10-CM | POA: Diagnosis not present

## 2022-05-05 DIAGNOSIS — J440 Chronic obstructive pulmonary disease with acute lower respiratory infection: Secondary | ICD-10-CM | POA: Diagnosis not present

## 2022-05-06 DIAGNOSIS — Z720 Tobacco use: Secondary | ICD-10-CM | POA: Diagnosis not present

## 2022-05-06 DIAGNOSIS — M1990 Unspecified osteoarthritis, unspecified site: Secondary | ICD-10-CM | POA: Diagnosis not present

## 2022-05-06 DIAGNOSIS — F17200 Nicotine dependence, unspecified, uncomplicated: Secondary | ICD-10-CM | POA: Diagnosis not present

## 2022-05-06 DIAGNOSIS — M25552 Pain in left hip: Secondary | ICD-10-CM | POA: Diagnosis not present

## 2022-05-08 ENCOUNTER — Encounter: Payer: Medicare Other | Admitting: Physical Medicine and Rehabilitation

## 2022-05-12 DIAGNOSIS — M1990 Unspecified osteoarthritis, unspecified site: Secondary | ICD-10-CM | POA: Diagnosis not present

## 2022-05-12 DIAGNOSIS — M25552 Pain in left hip: Secondary | ICD-10-CM | POA: Diagnosis not present

## 2022-05-12 DIAGNOSIS — Z720 Tobacco use: Secondary | ICD-10-CM | POA: Diagnosis not present

## 2022-05-12 DIAGNOSIS — J40 Bronchitis, not specified as acute or chronic: Secondary | ICD-10-CM | POA: Diagnosis not present

## 2022-05-28 DIAGNOSIS — M1990 Unspecified osteoarthritis, unspecified site: Secondary | ICD-10-CM | POA: Diagnosis not present

## 2022-05-28 DIAGNOSIS — M25552 Pain in left hip: Secondary | ICD-10-CM | POA: Diagnosis not present

## 2022-05-28 DIAGNOSIS — J40 Bronchitis, not specified as acute or chronic: Secondary | ICD-10-CM | POA: Diagnosis not present

## 2022-05-28 DIAGNOSIS — F17200 Nicotine dependence, unspecified, uncomplicated: Secondary | ICD-10-CM | POA: Diagnosis not present

## 2022-06-04 DIAGNOSIS — J40 Bronchitis, not specified as acute or chronic: Secondary | ICD-10-CM | POA: Diagnosis not present

## 2022-06-04 DIAGNOSIS — Z720 Tobacco use: Secondary | ICD-10-CM | POA: Diagnosis not present

## 2022-06-04 DIAGNOSIS — F17200 Nicotine dependence, unspecified, uncomplicated: Secondary | ICD-10-CM | POA: Diagnosis not present

## 2022-06-04 DIAGNOSIS — M1990 Unspecified osteoarthritis, unspecified site: Secondary | ICD-10-CM | POA: Diagnosis not present

## 2022-06-04 DIAGNOSIS — M25552 Pain in left hip: Secondary | ICD-10-CM | POA: Diagnosis not present

## 2022-06-23 ENCOUNTER — Ambulatory Visit: Payer: Medicare Other | Admitting: Orthopedic Surgery

## 2022-06-25 DIAGNOSIS — F17200 Nicotine dependence, unspecified, uncomplicated: Secondary | ICD-10-CM | POA: Diagnosis not present

## 2022-06-25 DIAGNOSIS — M1990 Unspecified osteoarthritis, unspecified site: Secondary | ICD-10-CM | POA: Diagnosis not present

## 2022-06-25 DIAGNOSIS — M25552 Pain in left hip: Secondary | ICD-10-CM | POA: Diagnosis not present

## 2022-06-25 DIAGNOSIS — Z720 Tobacco use: Secondary | ICD-10-CM | POA: Diagnosis not present

## 2022-07-02 DIAGNOSIS — M25552 Pain in left hip: Secondary | ICD-10-CM | POA: Diagnosis not present

## 2022-07-02 DIAGNOSIS — J40 Bronchitis, not specified as acute or chronic: Secondary | ICD-10-CM | POA: Diagnosis not present

## 2022-07-02 DIAGNOSIS — F17200 Nicotine dependence, unspecified, uncomplicated: Secondary | ICD-10-CM | POA: Diagnosis not present

## 2022-07-02 DIAGNOSIS — Z720 Tobacco use: Secondary | ICD-10-CM | POA: Diagnosis not present

## 2022-07-09 DIAGNOSIS — Z720 Tobacco use: Secondary | ICD-10-CM | POA: Diagnosis not present

## 2022-07-09 DIAGNOSIS — F17200 Nicotine dependence, unspecified, uncomplicated: Secondary | ICD-10-CM | POA: Diagnosis not present

## 2022-07-09 DIAGNOSIS — J40 Bronchitis, not specified as acute or chronic: Secondary | ICD-10-CM | POA: Diagnosis not present

## 2022-07-14 ENCOUNTER — Ambulatory Visit: Payer: Medicare Other | Admitting: Orthopedic Surgery

## 2022-07-14 ENCOUNTER — Encounter: Payer: Self-pay | Admitting: Orthopedic Surgery

## 2022-07-14 DIAGNOSIS — J440 Chronic obstructive pulmonary disease with acute lower respiratory infection: Secondary | ICD-10-CM | POA: Diagnosis not present

## 2022-07-14 DIAGNOSIS — K219 Gastro-esophageal reflux disease without esophagitis: Secondary | ICD-10-CM | POA: Diagnosis not present

## 2022-07-14 DIAGNOSIS — Z0000 Encounter for general adult medical examination without abnormal findings: Secondary | ICD-10-CM | POA: Diagnosis not present

## 2022-07-14 DIAGNOSIS — Z6820 Body mass index (BMI) 20.0-20.9, adult: Secondary | ICD-10-CM | POA: Diagnosis not present

## 2022-07-14 DIAGNOSIS — E7849 Other hyperlipidemia: Secondary | ICD-10-CM | POA: Diagnosis not present

## 2022-07-16 DIAGNOSIS — M1990 Unspecified osteoarthritis, unspecified site: Secondary | ICD-10-CM | POA: Diagnosis not present

## 2022-07-16 DIAGNOSIS — M25552 Pain in left hip: Secondary | ICD-10-CM | POA: Diagnosis not present

## 2022-07-16 DIAGNOSIS — R42 Dizziness and giddiness: Secondary | ICD-10-CM | POA: Diagnosis not present

## 2022-07-16 DIAGNOSIS — J40 Bronchitis, not specified as acute or chronic: Secondary | ICD-10-CM | POA: Diagnosis not present

## 2022-08-04 DIAGNOSIS — M1990 Unspecified osteoarthritis, unspecified site: Secondary | ICD-10-CM | POA: Diagnosis not present

## 2022-08-04 DIAGNOSIS — J40 Bronchitis, not specified as acute or chronic: Secondary | ICD-10-CM | POA: Diagnosis not present

## 2022-08-04 DIAGNOSIS — M25552 Pain in left hip: Secondary | ICD-10-CM | POA: Diagnosis not present

## 2022-08-04 DIAGNOSIS — R42 Dizziness and giddiness: Secondary | ICD-10-CM | POA: Diagnosis not present

## 2022-08-06 DIAGNOSIS — J40 Bronchitis, not specified as acute or chronic: Secondary | ICD-10-CM | POA: Diagnosis not present

## 2022-08-06 DIAGNOSIS — M1990 Unspecified osteoarthritis, unspecified site: Secondary | ICD-10-CM | POA: Diagnosis not present

## 2022-08-06 DIAGNOSIS — R42 Dizziness and giddiness: Secondary | ICD-10-CM | POA: Diagnosis not present

## 2022-08-06 DIAGNOSIS — M25552 Pain in left hip: Secondary | ICD-10-CM | POA: Diagnosis not present

## 2022-08-06 NOTE — Progress Notes (Deleted)
Referring Provider: Neale Burly, MD Primary Care Physician:  Neale Burly, MD Primary GI Physician: Dr. Abbey Chatters  No chief complaint on file.   HPI:   Jacqueline [name] is a 71 y.o. female presenting today with a history of GERD, dysphagia s/p empiric esophageal dilations, PUD in 2016, nonbleeding gastric AVM in 2016, benign colon polyps in 2012 overdue for colonoscopy, presenting today for follow-up of GERD, dysphagia, anemia, and constipation***.  Last seen in our office 04/24/2021 for GERD, dysphagia, anemia, and constipation.  Reported daily GERD symptoms on omeprazole 20 mg daily, associated intermittent nausea/vomiting, intermittent solid food dysphagia, and postprandial mid/upper abdominal pain/fullness.  Drinking Coke daily.  Loves spicy food.  Reported some weight loss due to limiting oral intake secondary to upper GI symptoms.  Denied overt GI bleeding.  Reported hemoglobin declined to 10-11 range 3-4 months ago.  If taking iron, she gets constipated.  Plan included increasing omeprazole to 40 mg twice daily, EGD with possible dilation, circle back to colonoscopy later this year, update CBC and iron panel, start MiraLAX daily.  Labs 05/29/2021: Hemoglobin 12.4 with normocytic indices, iron panel within normal limits.  EGD 06/04/2021: Benign-appearing esophageal stenosis dilated, gastritis biopsied (reactive gastropathy), normal examined duodenum.  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   Irritable bowel syndrome    Knee pain    Migraines    Migraines    Peripheral neuropathy    Pneumonia    Polysubstance abuse (HCC)    opiates, cocaine, marijuana   Status post mitral valve repair    Echo 5/19: Mild LVH, EF 60-65, normal wall motion, status post mitral valve repair (mean gradient 4), mild LAE, mildly reduced RVSF    Past Surgical History:  Procedure Laterality Date   ABDOMINAL HYSTERECTOMY     ABDOMINAL SURGERY     BACK SURGERY     back surgery /ray cage fusion [place], [place]     BALLOON DILATION N/A 06/04/2021   Procedure: BALLOON DILATION;  Surgeon: Eloise Harman, DO;  [facility]: [facility];  Service: Endoscopy;  Laterality: N/A;   BIOPSY  07/31/2015   Procedure: BIOPSY (DUODENAL, GASTRIC, GASTRIC ULCER, ESOPHAGEAL);  Surgeon: Danie Binder, MD;  Location: [facility];  Service: Endoscopy;;   CESAREAN SECTION     CHOLECYSTECTOMY     COLONOSCOPY  2001   Dr. Laural Golden, hemorrhoids   COLONOSCOPY  10/21/2011   [id number] polyp multiple/internal hemorrhoids   DILATION AND CURETTAGE OF UTERUS     ESOPHAGEAL DILATION N/A 07/31/2015   Procedure: ESOPHAGEAL DILATION WIRE GUIDED WITH SAVORY DILATORS 15MM, 16MM;  Surgeon: Danie Binder, MD;  Location: [facility];  Service: Endoscopy;  Laterality: N/A;   ESOPHAGOGASTRODUODENOSCOPY  11/2008   A 9-mm submucosal lesion seen in the cardia., gastritis, no hpylori   ESOPHAGOGASTRODUODENOSCOPY (  EGD) WITH PROPOFOL N/A 07/31/2015   TIR:[id number] ulcer and moderate gastritis, dysphagia, empirical dilation with no identified source. esophageal, gastric, duodenal bx unremarkable.    ESOPHAGOGASTRODUODENOSCOPY (EGD) WITH PROPOFOL N/A 11/13/2015   SLF: 1. Gastric ulcer has healed. 2. single non-bleeding gastric AVMs 3. mild non-erosive gastritis.    ESOPHAGOGASTRODUODENOSCOPY (EGD) WITH PROPOFOL N/A 10/20/2017   Surgeon: Danie Binder, MD;   web in the upper third of the esophagus s/p dilation, gastritis due  to NSAIDs.    ESOPHAGOGASTRODUODENOSCOPY (EGD) WITH PROPOFOL N/A 06/04/2021   Procedure: ESOPHAGOGASTRODUODENOSCOPY (EGD) WITH PROPOFOL;  Surgeon: Eloise Harman, DO;  [facility]: [facility];  Service: Endoscopy;  Laterality: N/A;  7:30am   EXCISION OF ATRIAL MYXOMA N/A 04/01/2018   Procedure: RESECTION OF LEFT ATRIAL MYXOMA;  Surgeon: Melrose Nakayama, MD;  Location: [facility];  Service: Open Heart Surgery;  Laterality: N/A;   FOOT SURGERY     GALLBLADDER SURGERY  2010   LEFT HEART CATH AND CORONARY ANGIOGRAPHY N/A 03/29/2018   Procedure: LEFT HEART CATH AND CORONARY ANGIOGRAPHY;  Surgeon: Lorretta Harp, MD;  Location: [facility];  Service: Cardiovascular;  Laterality: N/A;   MITRAL VALVE REPAIR N/A 04/01/2018   Procedure: MITRAL VALVE REPAIR.;  Surgeon: Melrose Nakayama, MD;  Location: [facility];  Service: Open Heart Surgery;  Laterality: N/A;  Mitral valve annuloplasty    PARTIAL GASTRECTOMY  1990   stomach tumor (large submucosal tumor felt to be be a myoma, but 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   SAVORY DILATION  02/03/2012   [id number] in the distal esophagus/Mild gastritis   TEE WITHOUT CARDIOVERSION N/A 04/01/2018   Procedure: TRANSESOPHAGEAL ECHOCARDIOGRAM (TEE);  Surgeon: Melrose Nakayama, MD;  Location: [facility];  Service: Open Heart Surgery;  Laterality: N/A;   toe graft     TONSILLECTOMY      Current Outpatient Medications  Medication Sig Dispense Refill   acetaminophen (TYLENOL) 500 MG tablet Take 500-1,000 mg by mouth every 6 (six) hours as needed for moderate pain.     aspirin EC 81 MG tablet Take 1 tablet (81 mg total) by mouth daily. 90 tablet 3   atorvastatin (LIPITOR) 40 MG tablet Take 1 tablet (40 mg total) by mouth daily at 6 PM. (Patient taking differently: Take 40 mg by mouth daily.) 30 tablet 6   Carboxymethylcellul-Glycerin (LUBRICATING EYE DROPS OP) Place 1 drop into both eyes  daily as needed (allergies/dry eyes). (Patient not taking: Reported on 03/24/2022)     cetirizine (ZYRTEC) 10 MG tablet Take 10 mg by mouth daily.     cyclobenzaprine (FLEXERIL) 10 MG tablet Take 10 mg by mouth 3 (three) times daily.  0   diphenhydrAMINE (BENADRYL) 25 MG tablet Take 25-50 mg by mouth daily as needed for allergies or sleep.     ferrous sulfate 325 (65 FE) MG EC tablet Take 325 mg by mouth See admin instructions. [id number] mg by mouth one week then stops 1 week, then restarts for 1 week     FLUoxetine (PROZAC) 40 MG capsule Take 40 mg by mouth daily.   0   gabapentin (NEURONTIN) 300 MG capsule Take 2 capsules (600 mg total) by mouth 3 (three) times daily. 180 capsule 0   omeprazole (PRILOSEC) 40 MG capsule TAKE 1 CAPSULE(40 MG) BY MOUTH TWICE DAILY BEFORE A MEAL 60 capsule 5   ondansetron (ZOFRAN) 4 MG tablet Take 1 tablet (4  mg total) by mouth every 8 (eight) hours as needed for nausea or vomiting. 20 tablet 0   sodium chloride (OCEAN) 0.65 % SOLN nasal spray Place 1-2 sprays into both nostrils as needed for congestion.     SUBOXONE 8-2 MG FILM Place 1 tablet under the tongue in the morning, at noon, and at bedtime.  0   No current facility-administered medications for this visit.    Allergies as of 08/07/2022 - Review Complete 03/24/2022  Allergen Reaction Noted   Aspirin Other (See Comments)    Ketorolac tromethamine Other (See Comments) 09/30/2011   Nsaids Other (See Comments) 12/18/2011   Sulfonamide derivatives Nausea And Vomiting and Other (See Comments)    Imitrex [sumatriptan base] Palpitations 09/30/2011   Promethazine hcl Other (See Comments) 12/18/2011    Family History  Problem Relation Age of Onset   Colon cancer Other    Colon cancer Mother        diagnosed in late 78s and died age 82   Heart failure Mother    Heart defect Other        family history    Arthritis Other        family history   COPD Other        family history    Cancer Other         multiple unknown type   Heart attack Father        deceased at 63   Hypertension Father    Heart failure Father    Lung cancer Maternal Uncle    Throat cancer Maternal Uncle    Colon cancer Paternal Uncle    Colon cancer Maternal Aunt    Anesthesia problems Neg Hx    Hypotension Neg Hx    Malignant hyperthermia Neg Hx    Pseudochol deficiency Neg Hx     Social History   Socioeconomic History   Marital status: Divorced    Spouse name: Not on file   Number of children: 2   Years of education: 10th grade   Highest education level: Not on file  Occupational History   Occupation: disabled: back problems   Tobacco Use   Smoking status: Every Day    Packs/day: 0.30    Years: 35.00    Total pack years: 10.50    Types: Cigarettes   Smokeless tobacco: Never  Vaping Use   Vaping Use: Never used  Substance and Sexual Activity   Alcohol use: Not Currently    Comment: rare   Drug use: Yes    Frequency: 1.0 times per week    Types: Marijuana    Comment: occ marijuana   Sexual activity: Not Currently    Birth control/protection: Surgical  Other Topics Concern   Not on file  Social History Narrative   Not on file   Social Determinants of Health   Financial Resource Strain: Not on file  Food Insecurity: Not on file  Transportation Needs: Not on file  Physical Activity: Not on file  Stress: Not on file  Social Connections: Not on file    Review of Systems: Gen: Denies fever, chills, cold or flu like symptoms, pre-syncope, or syncope.  CV: Denies chest pain, palpitations. Resp: Denies dyspnea, cough.  GI: See HPI Heme: See HPI  Physical Exam: There were no vitals taken for this visit. General:   Alert and oriented. No distress noted. Pleasant and cooperative.  Head:  Normocephalic and atraumatic. Eyes:  Conjuctiva clear without scleral icterus. Heart:  S1,  S2 present without murmurs appreciated. Lungs:  Clear to auscultation bilaterally. No wheezes, rales, or  rhonchi. No distress.  Abdomen:  +BS, soft, non-tender and non-distended. No rebound or guarding. No HSM or masses noted. Msk:  Symmetrical without gross deformities. Normal posture. Extremities:  Without edema. Neurologic:  Alert and  oriented x4 Psych:  Normal mood and affect.    Assessment:  71 y.o. female presenting today with a history of GERD, dysphagia s/p empiric esophageal dilations, PUD in 2016, nonbleeding gastric AVM in 2016, benign colon polyps in 2012 overdue for colonoscopy, presenting today for follow-up of GERD, dysphagia, anemia, and constipation ***.   GERD:   Dysphagia:   Constipation:   Anemia:    Plan:  ***   Aliene Altes, PA-C [facility] 08/07/2022

## 2022-08-07 ENCOUNTER — Ambulatory Visit: Payer: Medicare Other | Admitting: Gastroenterology

## 2022-08-19 DIAGNOSIS — M1990 Unspecified osteoarthritis, unspecified site: Secondary | ICD-10-CM | POA: Diagnosis not present

## 2022-08-19 DIAGNOSIS — M25552 Pain in left hip: Secondary | ICD-10-CM | POA: Diagnosis not present

## 2022-08-19 DIAGNOSIS — J40 Bronchitis, not specified as acute or chronic: Secondary | ICD-10-CM | POA: Diagnosis not present

## 2022-08-19 DIAGNOSIS — R42 Dizziness and giddiness: Secondary | ICD-10-CM | POA: Diagnosis not present

## 2022-09-01 DIAGNOSIS — M1990 Unspecified osteoarthritis, unspecified site: Secondary | ICD-10-CM | POA: Diagnosis not present

## 2022-09-01 DIAGNOSIS — M25552 Pain in left hip: Secondary | ICD-10-CM | POA: Diagnosis not present

## 2022-09-01 DIAGNOSIS — R42 Dizziness and giddiness: Secondary | ICD-10-CM | POA: Diagnosis not present

## 2022-09-01 DIAGNOSIS — J40 Bronchitis, not specified as acute or chronic: Secondary | ICD-10-CM | POA: Diagnosis not present

## 2022-09-25 DIAGNOSIS — Z720 Tobacco use: Secondary | ICD-10-CM | POA: Diagnosis not present

## 2022-10-08 DIAGNOSIS — F17200 Nicotine dependence, unspecified, uncomplicated: Secondary | ICD-10-CM | POA: Diagnosis not present

## 2022-10-23 DIAGNOSIS — R42 Dizziness and giddiness: Secondary | ICD-10-CM | POA: Diagnosis not present

## 2022-10-23 DIAGNOSIS — G43909 Migraine, unspecified, not intractable, without status migrainosus: Secondary | ICD-10-CM | POA: Diagnosis not present

## 2022-10-23 DIAGNOSIS — M1990 Unspecified osteoarthritis, unspecified site: Secondary | ICD-10-CM | POA: Diagnosis not present

## 2022-10-23 DIAGNOSIS — J40 Bronchitis, not specified as acute or chronic: Secondary | ICD-10-CM | POA: Diagnosis not present

## 2022-11-04 DIAGNOSIS — J40 Bronchitis, not specified as acute or chronic: Secondary | ICD-10-CM | POA: Diagnosis not present

## 2022-11-04 DIAGNOSIS — F17200 Nicotine dependence, unspecified, uncomplicated: Secondary | ICD-10-CM | POA: Diagnosis not present

## 2022-11-04 DIAGNOSIS — Z720 Tobacco use: Secondary | ICD-10-CM | POA: Diagnosis not present

## 2022-11-04 DIAGNOSIS — R42 Dizziness and giddiness: Secondary | ICD-10-CM | POA: Diagnosis not present

## 2022-11-20 DIAGNOSIS — M25552 Pain in left hip: Secondary | ICD-10-CM | POA: Diagnosis not present

## 2022-11-20 DIAGNOSIS — M1990 Unspecified osteoarthritis, unspecified site: Secondary | ICD-10-CM | POA: Diagnosis not present

## 2022-11-20 DIAGNOSIS — J40 Bronchitis, not specified as acute or chronic: Secondary | ICD-10-CM | POA: Diagnosis not present

## 2022-11-20 DIAGNOSIS — R42 Dizziness and giddiness: Secondary | ICD-10-CM | POA: Diagnosis not present

## 2022-11-28 ENCOUNTER — Encounter: Payer: Self-pay | Admitting: *Deleted

## 2022-11-28 ENCOUNTER — Telehealth: Payer: Self-pay | Admitting: Internal Medicine

## 2022-11-28 NOTE — Telephone Encounter (Signed)
Questionnaire mailed to patient

## 2022-11-28 NOTE — Telephone Encounter (Signed)
Patient called and said she was overdue for her colonoscopy.  Could you mail her a questionnaire please/

## 2022-12-02 DIAGNOSIS — K219 Gastro-esophageal reflux disease without esophagitis: Secondary | ICD-10-CM | POA: Diagnosis not present

## 2022-12-02 DIAGNOSIS — L981 Factitial dermatitis: Secondary | ICD-10-CM | POA: Diagnosis not present

## 2022-12-02 DIAGNOSIS — J449 Chronic obstructive pulmonary disease, unspecified: Secondary | ICD-10-CM | POA: Diagnosis not present

## 2022-12-02 DIAGNOSIS — Z6821 Body mass index (BMI) 21.0-21.9, adult: Secondary | ICD-10-CM | POA: Diagnosis not present

## 2022-12-02 DIAGNOSIS — E7849 Other hyperlipidemia: Secondary | ICD-10-CM | POA: Diagnosis not present

## 2022-12-04 DIAGNOSIS — J40 Bronchitis, not specified as acute or chronic: Secondary | ICD-10-CM | POA: Diagnosis not present

## 2022-12-04 DIAGNOSIS — M25552 Pain in left hip: Secondary | ICD-10-CM | POA: Diagnosis not present

## 2022-12-04 DIAGNOSIS — M1990 Unspecified osteoarthritis, unspecified site: Secondary | ICD-10-CM | POA: Diagnosis not present

## 2022-12-04 DIAGNOSIS — F17200 Nicotine dependence, unspecified, uncomplicated: Secondary | ICD-10-CM | POA: Diagnosis not present

## 2022-12-08 ENCOUNTER — Ambulatory Visit
Admission: EM | Admit: 2022-12-08 | Discharge: 2022-12-08 | Disposition: A | Discharge status: Home / Self Care | Payer: Medicare Other | Attending: Nurse Practitioner | Admitting: Nurse Practitioner

## 2022-12-08 DIAGNOSIS — M79604 Pain in right leg: Secondary | ICD-10-CM

## 2022-12-08 DIAGNOSIS — M79605 Pain in left leg: Secondary | ICD-10-CM

## 2022-12-08 MED ORDER — DEXAMETHASONE SODIUM PHOSPHATE 10 MG/ML IJ SOLN
10.0000 mg | INTRAMUSCULAR | Status: AC
  Administered 2022-12-08: 10 mg via INTRAMUSCULAR

## 2022-12-08 NOTE — ED Provider Notes (Signed)
[facility]    CSN: [id number] Arrival date & time: 12/08/22  1416      History   Chief Complaint Chief Complaint  Patient presents with   Leg Pain    HPI Jacqueline [name] is a 71 y.o. female.   The history is provided by the patient.   Patient presents for complaints of bilateral leg pain, with increased pain in the left leg.  Patient has a history of leg pain and hip pain.  Patient denies any new injury or trauma.  Reports that this is a chronic condition.  She currently takes gabapentin for her symptoms.  Patient also states that she has noticed swelling in her bilateral calves and feet and ankles.  She states that this morning, she felt a "knot "brought in her left groin which is since resolved.  She states she has not tried any other medication for her symptoms.  Patient reports she was scheduled to have an injection in the left hip with orthopedics, but that she had to cancel the appointment due to unforeseen circumstances.  States she is planning on getting the appointment rescheduled.  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   History of atrial myxoma    Chronic pain syndrome 03/27/2018   Tobacco abuse 03/27/2018   HTN (hypertension) 03/27/2018   Constipation 10/06/2017   GERD (gastroesophageal reflux disease) 09/26/2016   PUD (peptic ulcer disease) 10/01/2015   Nausea with vomiting 10/01/2015   Loss of weight 10/01/2015   Dizzy 07/16/2015   Major depressive disorder, recurrent, severe without psychotic features ([place])    Substance induced mood disorder ([place]) 04/24/2015   Opioid type dependence, continuous ([place]) 04/24/2015   Severe major depression without psychotic features ([place]) 04/24/2015   PTSD (post-traumatic stress disorder) 04/24/2015   Chest pain, localized 04/28/2012   Odynophagia 09/30/2011   Colon cancer screening 09/30/2011   Gastric tumor 09/30/2011   CLOSED FRACTURE OF UPPER END OF FIBULA 07/31/2010   Dysphagia 07/17/2009   ABDOMINAL PAIN, GENERALIZED 07/17/2009   GASTRITIS 07/12/2009   IBS 07/12/2009   RUQ PAIN 07/12/2009   EPIGASTRIC PAIN 07/12/2009   PANCREATITIS, ACUTE, HX OF 07/12/2009   Osteoarthrosis, unspecified whether generalized or localized,  hand 11/09/2008   OSTEOARTHRITIS, LOWER LEG 10/25/2008   DERANGEMENT MENISCUS 10/16/2008   KNEE PAIN 10/16/2008    Past Surgical History:  Procedure Laterality Date   ABDOMINAL HYSTERECTOMY     ABDOMINAL SURGERY     BACK SURGERY     back surgery /ray cage fusion comberg, gso     BALLOON DILATION N/A 06/04/2021   Procedure: BALLOON DILATION;  Surgeon: Eloise Harman, DO;  [facility];  Service: Endoscopy;  Laterality: N/A;   BIOPSY  07/31/2015   Procedure: BIOPSY (DUODENAL, GASTRIC, GASTRIC ULCER, ESOPHAGEAL);  Surgeon: Danie Binder, MD;  Location: [facility];  Service: Endoscopy;;   CESAREAN SECTION     CHOLECYSTECTOMY      COLONOSCOPY  2001   Dr. Laural Golden, hemorrhoids   COLONOSCOPY  10/21/2011   YNW:GNFAOZH polyp multiple/internal hemorrhoids   DILATION AND CURETTAGE OF UTERUS     ESOPHAGEAL DILATION N/A 07/31/2015   Procedure: ESOPHAGEAL DILATION WIRE GUIDED WITH SAVORY DILATORS 15MM, 16MM;  Surgeon: Danie Binder, MD;  Location: [facility];  Service: Endoscopy;  Laterality: N/A;   ESOPHAGOGASTRODUODENOSCOPY  11/2008   A 9-mm submucosal lesion seen in the cardia., gastritis, no hpylori   ESOPHAGOGASTRODUODENOSCOPY (EGD) WITH PROPOFOL N/A 07/31/2015   YQM:VHQIONG ulcer and moderate gastritis, dysphagia, empirical dilation with no identified source. esophageal, gastric, duodenal bx unremarkable.    ESOPHAGOGASTRODUODENOSCOPY (EGD) WITH PROPOFOL N/A 11/13/2015   SLF: 1. Gastric ulcer has healed. 2. single non-bleeding gastric AVMs 3. mild non-erosive gastritis.    ESOPHAGOGASTRODUODENOSCOPY (EGD) WITH PROPOFOL N/A 10/20/2017   Surgeon: Danie Binder, MD;   web in the upper third of the esophagus s/p dilation, gastritis due to NSAIDs.    ESOPHAGOGASTRODUODENOSCOPY (EGD) WITH PROPOFOL N/A 06/04/2021   Procedure: ESOPHAGOGASTRODUODENOSCOPY (EGD) WITH PROPOFOL;  Surgeon: Eloise Harman, DO;  [facility];  Service: Endoscopy;  Laterality: N/A;  7:30am   EXCISION OF ATRIAL MYXOMA N/A 04/01/2018   Procedure: RESECTION OF LEFT ATRIAL MYXOMA;  Surgeon: Melrose Nakayama, MD;  Location: [place];  Service: Open Heart Surgery;  Laterality: N/A;   FOOT SURGERY     GALLBLADDER SURGERY  2010   LEFT HEART CATH AND CORONARY ANGIOGRAPHY N/A 03/29/2018   Procedure: LEFT HEART CATH AND CORONARY ANGIOGRAPHY;  Surgeon: Lorretta Harp, MD;  Location: [facility];  Service: Cardiovascular;  Laterality: N/A;   MITRAL VALVE REPAIR N/A 04/01/2018   Procedure: MITRAL VALVE REPAIR.;  Surgeon: Melrose Nakayama, MD;  Location: [place];  Service: Open Heart Surgery;  Laterality: N/A;  Mitral valve annuloplasty    PARTIAL  GASTRECTOMY  1990   stomach tumor (large submucosal tumor felt to be be a myoma, but 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   SAVORY DILATION  02/03/2012   EXB:[id number] in the distal esophagus/Mild gastritis   TEE WITHOUT CARDIOVERSION N/A 04/01/2018   Procedure: TRANSESOPHAGEAL ECHOCARDIOGRAM (TEE);  Surgeon: Melrose Nakayama, MD;  Location: [place];  Service: Open Heart Surgery;  Laterality: N/A;   toe graft     TONSILLECTOMY      OB History     Gravida  3   Para  2   Term  2   Preterm      AB  1   Living  2      SAB  1   IAB      Ectopic      Multiple  Live Births               Home Medications    Prior to Admission medications   Medication Sig Start Date End Date Taking? Authorizing Provider  acetaminophen (TYLENOL) 500 MG tablet Take 500-1,000 mg by mouth every 6 (six) hours as needed for moderate pain.    [provider]  aspirin EC 81 MG tablet Take 1 tablet (81 mg total) by mouth daily. 07/22/18   Herminio Commons, MD  atorvastatin (LIPITOR) 40 MG tablet Take 1 tablet (40 mg total) by mouth daily at 6 PM. Patient taking differently: Take 40 mg by mouth daily. 06/07/18   Herminio Commons, MD  Carboxymethylcellul-Glycerin (LUBRICATING EYE DROPS OP) Place 1 drop into both eyes daily as needed (allergies/dry eyes). Patient not taking: Reported on 03/24/2022    [provider]  cetirizine (ZYRTEC) 10 MG tablet Take 10 mg by mouth daily.    [provider]  cyclobenzaprine (FLEXERIL) 10 MG tablet Take 10 mg by mouth 3 (three) times daily. 09/22/17   [provider]  diphenhydrAMINE (BENADRYL) 25 MG tablet Take 25-50 mg by mouth daily as needed for allergies or sleep.    [provider]  ferrous sulfate 325 (65 FE) MG EC tablet Take 325 mg by mouth See admin instructions. [id number] mg by mouth one week then stops 1 week, then restarts for 1  week    [provider]  FLUoxetine (PROZAC) 40 MG capsule Take 40 mg by mouth daily.  09/22/17   [provider]  gabapentin (NEURONTIN) 300 MG capsule Take 2 capsules (600 mg total) by mouth 3 (three) times daily. 04/30/15   Niel Hummer, NP  omeprazole (PRILOSEC) 40 MG capsule TAKE 1 CAPSULE(40 MG) BY MOUTH TWICE DAILY BEFORE A MEAL 01/08/22   Mahala Menghini, PA-C  ondansetron (ZOFRAN) 4 MG tablet Take 1 tablet (4 mg total) by mouth every 8 (eight) hours as needed for nausea or vomiting. 05/07/21   Erenest Rasher, PA-C  sodium chloride (OCEAN) 0.65 % SOLN nasal spray Place 1-2 sprays into both nostrils as needed for congestion.    [provider]  SUBOXONE 8-2 MG FILM Place 1 tablet under the tongue in the morning, at noon, and at bedtime. 04/20/18   [provider]    Family History Family History  Problem Relation Age of Onset   Colon cancer Mother        diagnosed in late 22s and died age 26   Heart failure Mother    Heart attack Father        deceased at 27   Hypertension Father    Heart failure Father    Colon cancer Maternal Aunt    Lung cancer Maternal Uncle    Throat cancer Maternal Uncle    Colon cancer Paternal Uncle    Colon cancer Other    Heart defect Other        family history    Arthritis Other        family history   COPD Other        family history    Cancer Other        multiple unknown type   Anesthesia problems Neg Hx    Hypotension Neg Hx    Malignant hyperthermia Neg Hx    Pseudochol deficiency Neg Hx     Social History Social History   Tobacco Use   Smoking status: Every Day  Packs/day: 0.30    Years: 35.00    Total pack years: 10.50    Types: Cigarettes   Smokeless tobacco: Never  Vaping Use   Vaping Use: Never used  Substance Use Topics   Alcohol use: Not Currently    Comment: rare   Drug use: Yes    Frequency: 1.0 times per week    Types: Marijuana    Comment: occ marijuana     Allergies    Aspirin, Ketorolac tromethamine, Nsaids, Sulfonamide derivatives, Imitrex [sumatriptan base], and Promethazine hcl   Review of Systems Review of Systems Per HPI  Physical Exam Triage Vital Signs ED Triage Vitals  Enc Vitals Group     BP 12/08/22 1541 (!) 151/90     Pulse Rate 12/08/22 1541 70     Resp 12/08/22 1541 17     Temp 12/08/22 1541 98.2 F (36.8 C)     Temp src --      SpO2 12/08/22 1541 97 %     Weight --      Height --      Head Circumference --      Peak Flow --      Pain Score 12/08/22 1540 10     Pain Loc --      Pain Edu? --      Excl. in [place]? --    No data found.  Updated Vital Signs BP (!) 151/90   Pulse 70   Temp 98.2 F (36.8 C)   Resp 17   SpO2 97%   Visual Acuity Right Eye Distance:   Left Eye Distance:   Bilateral Distance:    Right Eye Near:   Left Eye Near:    Bilateral Near:     Physical Exam Vitals and nursing note reviewed.  Constitutional:      General: She is not in acute distress.    Appearance: Normal appearance.  Cardiovascular:     Rate and Rhythm: Normal rate and regular rhythm.     Pulses: Normal pulses.     Heart sounds: Normal heart sounds.  Pulmonary:     Effort: Pulmonary effort is normal.     Breath sounds: Normal breath sounds.  Abdominal:     General: Bowel sounds are normal.     Palpations: Abdomen is soft.  Musculoskeletal:     Right upper leg: Tenderness present. No swelling.     Left upper leg: Tenderness present. No swelling.     Right lower leg: Normal. No swelling.     Left lower leg: Normal. No swelling.  Skin:    General: Skin is warm and dry.  Neurological:     General: No focal deficit present.     Mental Status: She is alert and oriented to person, place, and time.  Psychiatric:        Mood and Affect: Mood normal.        Behavior: Behavior normal.      UC Treatments / Results  Labs (all labs ordered are listed, but only abnormal results are displayed) Labs Reviewed - No data to  display  EKG   Radiology No results found.  Procedures Procedures (including critical care time)  Medications Ordered in UC Medications  dexamethasone (DECADRON) injection 10 mg (10 mg Intramuscular Given 12/08/22 1605)    Initial Impression / Assessment and Plan / UC Course  I have reviewed the triage vital signs and the nursing notes.  Pertinent labs & imaging results that were available during my  care of the patient were reviewed by me and considered in my medical decision making (see chart for details).  Patient is well-appearing, she is mildly hypertensive, but is in no acute distress.  Difficult to ascertain the cause of the patient's bilateral leg pain.  Suspect this is a chronic condition.  Decadron 10 mg IM was given in the clinic today.  Patient is currently taking gabapentin for her symptoms, most likely related to nerve pain.  Patient was advised to begin over-the-counter Tylenol arthritis strength '650mg'$  tablets for her symptoms.  Also recommend Epsom salt soaks for her pain.  Patient was advised to follow-up with her primary care physician for further evaluation and to reschedule her appointment with orthopedics.  Patient was advised to follow-up in the emergency department if she becomes unable to walk, has loss of bowel or bladder function, or other concerns.  Patient verbalizes understanding.  All questions were answered.  Patient is stable for discharge.  Final Clinical Impressions(s) / UC Diagnoses   Final diagnoses:  Bilateral leg pain     Discharge Instructions      You have been given an intramuscular injection of steroids today. Recommend taking over-the-counter Tylenol arthritis strength tablets to help with your ongoing leg pain. Warm Epsom salt soaks at least twice daily while leg pain persist. As discussed, elevate the legs is much as possible. Also recommend the use of compression socks to help with swelling in your lower extremities. If symptoms  fail to improve, please follow-up with your primary care physician for further evaluation of the swelling in your legs and feet.  Also recommend rescheduling your injection with orthopedics as soon as possible. If you become unable to walk, or experience loss of bowel or bladder function or other concerns, please follow-up in the emergency department. Follow-up as needed.     ED Prescriptions   None    PDMP not reviewed this encounter.   Tish Men, NP 12/08/22 1607

## 2022-12-08 NOTE — ED Triage Notes (Signed)
Pt presents with bilateral leg pain that is worse in her left leg. Reports she was supposed to get an injection in her hip but it was cancelled. Reports seeing a lump in her left groin.

## 2022-12-08 NOTE — Discharge Instructions (Addendum)
You have been given an intramuscular injection of steroids today. Recommend taking over-the-counter Tylenol arthritis strength tablets to help with your ongoing leg pain. Warm Epsom salt soaks at least twice daily while leg pain persist. As discussed, elevate the legs is much as possible. Also recommend the use of compression socks to help with swelling in your lower extremities. If symptoms fail to improve, please follow-up with your primary care physician for further evaluation of the swelling in your legs and feet.  Also recommend rescheduling your injection with orthopedics as soon as possible. If you become unable to walk, or experience loss of bowel or bladder function or other concerns, please follow-up in the emergency department. Follow-up as needed.

## 2022-12-12 ENCOUNTER — Telehealth: Payer: Self-pay | Admitting: Physical Medicine and Rehabilitation

## 2022-12-12 NOTE — Telephone Encounter (Signed)
Patient called. She would like a call to [facility] her appointment that she had with Dr. Ernestina Patches

## 2022-12-17 DIAGNOSIS — R42 Dizziness and giddiness: Secondary | ICD-10-CM | POA: Diagnosis not present

## 2022-12-17 DIAGNOSIS — M1990 Unspecified osteoarthritis, unspecified site: Secondary | ICD-10-CM | POA: Diagnosis not present

## 2022-12-17 DIAGNOSIS — M25552 Pain in left hip: Secondary | ICD-10-CM | POA: Diagnosis not present

## 2022-12-17 DIAGNOSIS — J40 Bronchitis, not specified as acute or chronic: Secondary | ICD-10-CM | POA: Diagnosis not present

## 2023-01-01 DIAGNOSIS — M25552 Pain in left hip: Secondary | ICD-10-CM | POA: Diagnosis not present

## 2023-01-01 DIAGNOSIS — M1990 Unspecified osteoarthritis, unspecified site: Secondary | ICD-10-CM | POA: Diagnosis not present

## 2023-01-01 DIAGNOSIS — R42 Dizziness and giddiness: Secondary | ICD-10-CM | POA: Diagnosis not present

## 2023-01-01 DIAGNOSIS — J40 Bronchitis, not specified as acute or chronic: Secondary | ICD-10-CM | POA: Diagnosis not present

## 2023-01-15 DIAGNOSIS — J40 Bronchitis, not specified as acute or chronic: Secondary | ICD-10-CM | POA: Diagnosis not present

## 2023-01-15 DIAGNOSIS — R42 Dizziness and giddiness: Secondary | ICD-10-CM | POA: Diagnosis not present

## 2023-01-15 DIAGNOSIS — M25552 Pain in left hip: Secondary | ICD-10-CM | POA: Diagnosis not present

## 2023-01-15 DIAGNOSIS — M1990 Unspecified osteoarthritis, unspecified site: Secondary | ICD-10-CM | POA: Diagnosis not present

## 2023-02-10 DIAGNOSIS — J40 Bronchitis, not specified as acute or chronic: Secondary | ICD-10-CM | POA: Diagnosis not present

## 2023-02-10 DIAGNOSIS — M1990 Unspecified osteoarthritis, unspecified site: Secondary | ICD-10-CM | POA: Diagnosis not present

## 2023-02-10 DIAGNOSIS — R42 Dizziness and giddiness: Secondary | ICD-10-CM | POA: Diagnosis not present

## 2023-02-10 DIAGNOSIS — M25552 Pain in left hip: Secondary | ICD-10-CM | POA: Diagnosis not present

## 2023-02-12 ENCOUNTER — Ambulatory Visit (HOSPITAL_COMMUNITY): Admission: RE | Admit: 2023-02-12 | Payer: 59 | Source: Ambulatory Visit

## 2023-02-26 ENCOUNTER — Ambulatory Visit
Admission: RE | Admit: 2023-02-26 | Discharge: 2023-02-26 | Disposition: A | Discharge status: Home / Self Care | Payer: 59 | Source: Ambulatory Visit | Attending: Nurse Practitioner | Admitting: Nurse Practitioner

## 2023-02-26 ENCOUNTER — Ambulatory Visit (INDEPENDENT_AMBULATORY_CARE_PROVIDER_SITE_OTHER): Payer: 59

## 2023-02-26 VITALS — BP 99/64 | HR 83 | Temp 98.5°F | Resp 18

## 2023-02-26 DIAGNOSIS — J40 Bronchitis, not specified as acute or chronic: Secondary | ICD-10-CM | POA: Diagnosis not present

## 2023-02-26 DIAGNOSIS — M25562 Pain in left knee: Secondary | ICD-10-CM

## 2023-02-26 DIAGNOSIS — M11262 Other chondrocalcinosis, left knee: Secondary | ICD-10-CM | POA: Diagnosis not present

## 2023-02-26 DIAGNOSIS — F17200 Nicotine dependence, unspecified, uncomplicated: Secondary | ICD-10-CM | POA: Diagnosis not present

## 2023-02-26 DIAGNOSIS — Z720 Tobacco use: Secondary | ICD-10-CM | POA: Diagnosis not present

## 2023-02-26 DIAGNOSIS — M1712 Unilateral primary osteoarthritis, left knee: Secondary | ICD-10-CM | POA: Diagnosis not present

## 2023-02-26 DIAGNOSIS — R42 Dizziness and giddiness: Secondary | ICD-10-CM | POA: Diagnosis not present

## 2023-02-26 MED ORDER — DEXAMETHASONE SODIUM PHOSPHATE 10 MG/ML IJ SOLN
10.0000 mg | Freq: Once | INTRAMUSCULAR | Status: AC
  Administered 2023-02-26: 10 mg via INTRAMUSCULAR

## 2023-02-26 MED ORDER — ACETAMINOPHEN 500 MG PO TABS
1000.0000 mg | ORAL_TABLET | Freq: Once | ORAL | Status: AC
  Administered 2023-02-26: 1000 mg via ORAL

## 2023-02-26 NOTE — Discharge Instructions (Addendum)
As we discussed today, the knee x-ray today shows swelling inside your left knee joint and findings of what may be pseudogout.    We have given you a shot of steroid today to help with pain.  Please continue Tylenol 500 to 1000 mg every 6 hours as needed at home for pain.  You can also continue to ice your knee, 15 minutes on, 45 minutes off every hour while awake.    It is okay to be up and moving around your knee as long as is not too painful.    Recommend following up with orthopedic provider if symptoms persist into early next week.  Contact information is below.

## 2023-02-26 NOTE — ED Triage Notes (Signed)
Pt reports swelling and pain in left knee x 3 days. Pain is worse with movement. Tylenol gives no relief.

## 2023-02-26 NOTE — ED Provider Notes (Signed)
[facility]    CSN: [id number] Arrival date & time: 02/26/23  [id number]      History   Chief Complaint Chief Complaint  Patient presents with   Appointment    1000   Knee Pain    HPI Jacqueline [name] is a 72 y.o. female.   Patient presents today for 3 day history of left knee pain.  Reports it began hurting Sunday night into Monday morning while she was sleeping.  She remembers waking up multiple times in the middle of the night with it hurting.  Reports at first, the knee was very swollen but this has improved.  No known injury, trauma, or accident involving the knee.  Reports there is mild pain constantly, however it is worse with weight bearing.  The pain has improved over the past few days as the swelling has gone down.  She has used ice, a knee brace, and has rested her knee with improvement.  Has also taken Tylenol which seems to help.  No recent fall from the knee giving way, but does report it feels weak with weight bearing or walking.  At times, has sensation of giving way.  No locking, popping, bruising, or redness.  No numbness or tingling in her toes or fever, nausea/vomiting since pain started.      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   History of atrial myxoma    Chronic pain syndrome 03/27/2018   Tobacco abuse 03/27/2018   HTN (hypertension) 03/27/2018   Constipation 10/06/2017   GERD (gastroesophageal reflux disease) 09/26/2016   PUD (peptic ulcer disease) 10/01/2015   Nausea with vomiting 10/01/2015   Loss of weight 10/01/2015   Dizzy 07/16/2015   Major depressive disorder, recurrent, severe without psychotic features ([place])    Substance induced mood disorder ([place]) 04/24/2015   Opioid type dependence, continuous ([place]) 04/24/2015   Severe major depression without psychotic features ([place]) 04/24/2015   PTSD (post-traumatic stress disorder) 04/24/2015   Chest pain, localized 04/28/2012   Odynophagia 09/30/2011   Colon cancer screening 09/30/2011   Gastric tumor 09/30/2011   CLOSED FRACTURE OF UPPER END OF FIBULA 07/31/2010   Dysphagia 07/17/2009   ABDOMINAL PAIN, GENERALIZED 07/17/2009  GASTRITIS 07/12/2009   IBS 07/12/2009   RUQ PAIN 07/12/2009   EPIGASTRIC PAIN 07/12/2009   PANCREATITIS, ACUTE, HX OF 07/12/2009   Osteoarthrosis, unspecified whether generalized or localized, hand 11/09/2008   OSTEOARTHRITIS, LOWER LEG 10/25/2008   DERANGEMENT MENISCUS 10/16/2008   KNEE PAIN 10/16/2008    Past Surgical History:  Procedure Laterality Date   ABDOMINAL HYSTERECTOMY     ABDOMINAL SURGERY     BACK SURGERY     back surgery /ray cage fusion [place], [place]     BALLOON DILATION N/A 06/04/2021   Procedure: BALLOON DILATION;  Surgeon: Eloise Harman, DO;  [facility];  Service: Endoscopy;  Laterality: N/A;   BIOPSY  07/31/2015   Procedure: BIOPSY (DUODENAL, GASTRIC, GASTRIC ULCER, ESOPHAGEAL);   Surgeon: Danie Binder, MD;  Location: [facility];  Service: Endoscopy;;   CESAREAN SECTION     CHOLECYSTECTOMY     COLONOSCOPY  2001   Dr. Laural Golden, hemorrhoids   COLONOSCOPY  10/21/2011   [id number] polyp multiple/internal hemorrhoids   DILATION AND CURETTAGE OF UTERUS     ESOPHAGEAL DILATION N/A 07/31/2015   Procedure: ESOPHAGEAL DILATION WIRE GUIDED WITH SAVORY DILATORS 15MM, 16MM;  Surgeon: Danie Binder, MD;  Location: [facility];  Service: Endoscopy;  Laterality: N/A;   ESOPHAGOGASTRODUODENOSCOPY  11/2008   A 9-mm submucosal lesion seen in the cardia., gastritis, no hpylori   ESOPHAGOGASTRODUODENOSCOPY (EGD) WITH PROPOFOL N/A 07/31/2015   [id number] ulcer and moderate gastritis, dysphagia, empirical dilation with no identified source. esophageal, gastric, duodenal bx unremarkable.    ESOPHAGOGASTRODUODENOSCOPY (EGD) WITH PROPOFOL N/A 11/13/2015   SLF: 1. Gastric ulcer has healed. 2. single non-bleeding gastric AVMs 3. mild non-erosive gastritis.    ESOPHAGOGASTRODUODENOSCOPY (EGD) WITH PROPOFOL N/A 10/20/2017   Surgeon: Danie Binder, MD;   web in the upper third of the esophagus s/p dilation, gastritis due to NSAIDs.    ESOPHAGOGASTRODUODENOSCOPY (EGD) WITH PROPOFOL N/A 06/04/2021   Procedure: ESOPHAGOGASTRODUODENOSCOPY (EGD) WITH PROPOFOL;  Surgeon: Eloise Harman, DO;  [facility];  Service: Endoscopy;  Laterality: N/A;  7:30am   EXCISION OF ATRIAL MYXOMA N/A 04/01/2018   Procedure: RESECTION OF LEFT ATRIAL MYXOMA;  Surgeon: Melrose Nakayama, MD;  Location: [place];  Service: Open Heart Surgery;  Laterality: N/A;   FOOT SURGERY     GALLBLADDER SURGERY  2010   LEFT HEART CATH AND CORONARY ANGIOGRAPHY N/A 03/29/2018   Procedure: LEFT HEART CATH AND CORONARY ANGIOGRAPHY;  Surgeon: Lorretta Harp, MD;  Location: [place] [facility];  Service: Cardiovascular;  Laterality: N/A;   MITRAL VALVE REPAIR N/A 04/01/2018   Procedure: MITRAL VALVE REPAIR.;  Surgeon: Melrose Nakayama, MD;  Location: [place];  Service: Open Heart Surgery;  Laterality: N/A;  Mitral valve annuloplasty    PARTIAL GASTRECTOMY  1990   stomach tumor (large submucosal tumor felt to be be a myoma, but 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   SAVORY DILATION  02/03/2012   [id number] in the distal esophagus/Mild gastritis   TEE WITHOUT CARDIOVERSION N/A 04/01/2018   Procedure: TRANSESOPHAGEAL ECHOCARDIOGRAM (TEE);  Surgeon: Melrose Nakayama, MD;  Location: [place];  Service: Open Heart Surgery;  Laterality: N/A;   toe graft     TONSILLECTOMY      OB History     Gravida  3   Para  2   Term  2   Preterm  AB  1   Living  2      SAB  1   IAB      Ectopic      Multiple      Live Births               Home Medications    Prior to Admission medications   Medication Sig Start Date End Date Taking? Authorizing Provider  acetaminophen (TYLENOL) 500 MG tablet Take 500-1,000 mg by mouth every 6 (six) hours as needed for moderate pain.    [provider]  aspirin EC 81 MG tablet Take 1 tablet (81 mg total) by mouth daily. 07/22/18   Herminio Commons, MD  atorvastatin (LIPITOR) 40 MG tablet Take 1 tablet (40 mg total) by mouth daily at 6 PM. Patient taking differently: Take 40 mg by mouth daily. 06/07/18   Herminio Commons, MD  Carboxymethylcellul-Glycerin (LUBRICATING EYE DROPS OP) Place 1 drop into both eyes daily as needed (allergies/dry eyes). Patient not taking: Reported on 03/24/2022    [provider]  cetirizine (ZYRTEC) 10 MG tablet Take 10 mg by mouth daily.    [provider]  cyclobenzaprine (FLEXERIL) 10 MG tablet Take 10 mg by mouth 3 (three) times daily. 09/22/17   [provider]  diphenhydrAMINE (BENADRYL) 25 MG tablet Take 25-50 mg by mouth daily as needed for allergies or sleep.    [provider]  ferrous sulfate 325 (65 FE) MG EC tablet Take  325 mg by mouth See admin instructions. [id number] mg by mouth one week then stops 1 week, then restarts for 1 week    [provider]  FLUoxetine (PROZAC) 40 MG capsule Take 40 mg by mouth daily.  09/22/17   [provider]  gabapentin (NEURONTIN) 300 MG capsule Take 2 capsules (600 mg total) by mouth 3 (three) times daily. 04/30/15   Niel Hummer, NP  omeprazole (PRILOSEC) 40 MG capsule TAKE 1 CAPSULE(40 MG) BY MOUTH TWICE DAILY BEFORE A MEAL 01/08/22   Mahala Menghini, PA-C  ondansetron (ZOFRAN) 4 MG tablet Take 1 tablet (4 mg total) by mouth every 8 (eight) hours as needed for nausea or vomiting. 05/07/21   Erenest Rasher, PA-C  sodium chloride (OCEAN) 0.65 % SOLN nasal spray Place 1-2 sprays into both nostrils as needed for congestion.    [provider]  SUBOXONE 8-2 MG FILM Place 1 tablet under the tongue in the morning, at noon, and at bedtime. 04/20/18   [provider]    Family History Family History  Problem Relation Age of Onset   Colon cancer Mother        diagnosed in late 18s and died age 64   Heart failure Mother    Heart attack Father        deceased at 70   Hypertension Father    Heart failure Father    Colon cancer Maternal Aunt    Lung cancer Maternal Uncle    Throat cancer Maternal Uncle    Colon cancer Paternal Uncle    Colon cancer Other    Heart defect Other        family history    Arthritis Other        family history   COPD Other        family history    Cancer Other        multiple unknown type   Anesthesia problems Neg Hx    Hypotension Neg  Hx    Malignant hyperthermia Neg Hx    Pseudochol deficiency Neg Hx     Social History Social History   Tobacco Use   Smoking status: Every Day    Packs/day: 0.30    Years: 35.00    Total pack years: 10.50    Types: Cigarettes   Smokeless tobacco: Never  Vaping Use   Vaping Use: Never used  Substance Use Topics   Alcohol use: Not Currently    Comment: rare   Drug  use: Yes    Frequency: 1.0 times per week    Types: Marijuana    Comment: occ marijuana     Allergies   Aspirin, Ketorolac tromethamine, Nsaids, Sulfonamide derivatives, Imitrex [sumatriptan base], and Promethazine hcl   Review of Systems Review of Systems Per HPI  Physical Exam Triage Vital Signs ED Triage Vitals  Enc Vitals Group     BP 02/26/23 1016 99/64     Pulse Rate 02/26/23 1016 83     Resp 02/26/23 1016 18     Temp 02/26/23 1016 98.5 F (36.9 C)     Temp Source 02/26/23 1016 Oral     SpO2 02/26/23 1016 96 %     Weight --      Height --      Head Circumference --      Peak Flow --      Pain Score 02/26/23 1019 7     Pain Loc --      Pain Edu? --      Excl. in [place]? --    No data found.  Updated Vital Signs BP 99/64 (BP Location: Right Arm)   Pulse 83   Temp 98.5 F (36.9 C) (Oral)   Resp 18   SpO2 96%   Visual Acuity Right Eye Distance:   Left Eye Distance:   Bilateral Distance:    Right Eye Near:   Left Eye Near:    Bilateral Near:     Physical Exam Vitals and nursing note reviewed.  Constitutional:      General: She is not in acute distress.    Appearance: Normal appearance. She is not toxic-appearing.  HENT:     Mouth/Throat:     Mouth: Mucous membranes are moist.     Pharynx: Oropharynx is clear.  Pulmonary:     Effort: Pulmonary effort is normal. No respiratory distress.  Musculoskeletal:     Right knee: Normal.     Left knee: Swelling and effusion present. No erythema, ecchymosis or bony tenderness. Normal range of motion. Tenderness present over the lateral joint line. Normal meniscus and normal patellar mobility. Normal pulse.     Right lower leg: Normal. No swelling, deformity, lacerations, tenderness or bony tenderness. No edema.     Left lower leg: Normal. No swelling, deformity, lacerations, tenderness or bony tenderness. No edema.  Skin:    General: Skin is warm and dry.     Capillary Refill: Capillary refill takes less than 2  seconds.     Coloration: Skin is not jaundiced or pale.     Findings: No erythema.  Neurological:     Mental Status: She is alert and oriented to person, place, and time.  Psychiatric:        Behavior: Behavior is cooperative.      UC Treatments / Results  Labs (all labs ordered are listed, but only abnormal results are displayed) Labs Reviewed - No data to display  EKG   Radiology DG Knee Complete  4 Views Left  Result Date: 02/26/2023 CLINICAL DATA:  Left knee pain and swelling for 3 days EXAM: LEFT KNEE - COMPLETE 4+ VIEW COMPARISON:  02/05/2014 FINDINGS: Meniscal chondrocalcinosis raising the possibility of CPPD arthropathy. Tricompartmental spurring more severe in the medial compartment, with moderate medial compartmental loss of articular space. Small knee effusion in the suprapatellar bursa. No fracture observed. IMPRESSION: 1. Chondrocalcinosis raising the possibility of CPPD arthropathy. 2. Tricompartmental spurring more severe in the medial compartment, with moderate medial compartmental articular space narrowing implying degenerative chondral thinning. 3. Small knee effusion in the suprapatellar bursa. Electronically Signed   By: Van Clines M.D.   On: 02/26/2023 11:15    Procedures Procedures (including critical care time)  Medications Ordered in UC Medications  acetaminophen (TYLENOL) tablet 1,000 mg (1,000 mg Oral Given 02/26/23 1100)  dexamethasone (DECADRON) injection 10 mg (10 mg Intramuscular Given 02/26/23 1150)    Initial Impression / Assessment and Plan / UC Course  I have reviewed the triage vital signs and the nursing notes.  Pertinent labs & imaging results that were available during my care of the patient were reviewed by me and considered in my medical decision making (see chart for details).   Patient is well-appearing, normotensive, afebrile, not tachycardic, not tachypneic, oxygenating well on room air.    1. Acute pain of left knee Suspect  pseudogout, although differential remains wide and includes LCL strain or tear Knee x-ray today shows chondrocalcinosis, tricompartmental spurring, and small knee effusion Patient was placed in a hinged knee brace to help with stabilization  Recommended ice, elevation, and continue Tylenol for pain Patient is unable to take NSAIDs secondary to gastrectomy, Decadron 10 mg IM given today for pain/inflammation Recommended close follow up with Orthopedic provider and contact information given ER and return precautions discussed  The patient was given the opportunity to ask questions.  All questions answered to their satisfaction.  The patient is in agreement to this plan.    Final Clinical Impressions(s) / UC Diagnoses   Final diagnoses:  Acute pain of left knee     Discharge Instructions      As we discussed today, the knee x-ray today shows swelling inside your left knee joint and findings of what may be pseudogout.    We have given you a shot of steroid today to help with pain.  Please continue Tylenol 500 to 1000 mg every 6 hours as needed at home for pain.  You can also continue to ice your knee, 15 minutes on, 45 minutes off every hour while awake.    It is okay to be up and moving around your knee as long as is not too painful.    Recommend following up with orthopedic provider if symptoms persist into early next week.  Contact information is below.    ED Prescriptions   None    PDMP not reviewed this encounter.   Eulogio Bear, NP 02/26/23 [phone]

## 2023-03-09 NOTE — Progress Notes (Deleted)
Referring Provider: Neale Burly, MD Primary Care Physician:  Neale Burly, MD Primary GI Physician: Dr. Abbey Chatters  No chief complaint on file.   HPI:   Jacqueline [name] is a 72 y.o. female with a history of GERD, dysphagia s/p empiric esophageal dilations, PUD in 2016, nonbleeding gastric AVM in 2016, benign colon polyps in 2012 overdue for colonoscopy, presenting today for ***  Last seen in our office 04/24/2021 for GERD, dysphagia, anemia, and constipation.  Reported daily GERD symptoms on omeprazole 20 mg daily, associated intermittent nausea/vomiting, intermittent solid food dysphagia, and postprandial mid/upper abdominal pain/fullness.  Drinking Coke daily.  Loves spicy food.  Reported some weight loss due to limiting oral intake secondary to upper GI symptoms.  Denied overt GI bleeding.  Reported hemoglobin declined to 10-11 range 3-4 months ago.  If taking iron, she gets constipated.  Plan included increasing omeprazole to 40 mg twice daily, EGD with possible dilation, circle back to colonoscopy later this year, update CBC and iron panel, start MiraLAX daily.  Labs 05/29/2021: Hemoglobin normal at 12.4 with normocytic indices, iron panel within normal limits.   EGD 06/04/2021: Benign-appearing esophageal stenosis dilated, gastritis biopsied (reactive gastropathy), normal examined duodenum. Gastric biopsy with reactive gastropathy, no h pylori.   We have not seen patient since her EGD.   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   Irritable bowel syndrome    Knee pain    Migraines    Migraines    Peripheral neuropathy    Pneumonia    Polysubstance abuse (HCC)    opiates, cocaine, marijuana   Status post mitral valve repair    Echo 5/19: Mild LVH, EF 60-65, normal wall motion, status post mitral valve repair (mean gradient 4), mild LAE, mildly reduced RVSF    Past Surgical History:  Procedure Laterality Date   ABDOMINAL HYSTERECTOMY     ABDOMINAL SURGERY     BACK SURGERY     back surgery /ray cage fusion [place], gso     BALLOON DILATION N/A 06/04/2021   Procedure: BALLOON DILATION;  Surgeon: Eloise Harman, DO;  [facility]: [facility];  Service: Endoscopy;  Laterality: N/A;   BIOPSY  07/31/2015   Procedure: BIOPSY (DUODENAL, GASTRIC, GASTRIC ULCER, ESOPHAGEAL);  Surgeon: Danie Binder, MD;  Location: [facility];  Service: Endoscopy;;   CESAREAN SECTION     CHOLECYSTECTOMY     COLONOSCOPY  2001   Dr. Laural Golden, hemorrhoids   COLONOSCOPY  10/21/2011   [id number] polyp multiple/internal hemorrhoids   DILATION AND CURETTAGE OF UTERUS     ESOPHAGEAL DILATION N/A 07/31/2015   Procedure: ESOPHAGEAL DILATION WIRE GUIDED WITH SAVORY DILATORS 15MM, 16MM;  Surgeon: Danie Binder, MD;  Location: [facility];  Service: Endoscopy;  Laterality: N/A;   ESOPHAGOGASTRODUODENOSCOPY  11/2008   A 9-mm submucosal lesion seen in the cardia., gastritis, no  hpylori   ESOPHAGOGASTRODUODENOSCOPY (EGD) WITH PROPOFOL N/A 07/31/2015   PIR:JJOACZY ulcer and moderate gastritis, dysphagia, empirical dilation with no identified source. esophageal, gastric, duodenal bx unremarkable.    ESOPHAGOGASTRODUODENOSCOPY (EGD) WITH PROPOFOL N/A 11/13/2015   SLF: 1. Gastric ulcer has healed. 2. single non-bleeding gastric AVMs 3. mild non-erosive gastritis.    ESOPHAGOGASTRODUODENOSCOPY (EGD) WITH PROPOFOL N/A 10/20/2017   Surgeon: Danie Binder, MD;   web in the upper third of the esophagus s/p dilation, gastritis due to NSAIDs.     ESOPHAGOGASTRODUODENOSCOPY (EGD) WITH PROPOFOL N/A 06/04/2021   Procedure: ESOPHAGOGASTRODUODENOSCOPY (EGD) WITH PROPOFOL;  Surgeon: Eloise Harman, DO;  [facility]: [facility];  Service: Endoscopy;  Laterality: N/A;  7:30am   EXCISION OF ATRIAL MYXOMA N/A 04/01/2018   Procedure: RESECTION OF LEFT ATRIAL MYXOMA;  Surgeon: Melrose Nakayama, MD;  Location: [facility];  Service: Open Heart Surgery;  Laterality: N/A;   FOOT SURGERY     GALLBLADDER SURGERY  2010   LEFT HEART CATH AND CORONARY ANGIOGRAPHY N/A 03/29/2018   Procedure: LEFT HEART CATH AND CORONARY ANGIOGRAPHY;  Surgeon: Lorretta Harp, MD;  Location: [facility];  Service: Cardiovascular;  Laterality: N/A;   MITRAL VALVE REPAIR N/A 04/01/2018   Procedure: MITRAL VALVE REPAIR.;  Surgeon: Melrose Nakayama, MD;  Location: [facility];  Service: Open Heart Surgery;  Laterality: N/A;  Mitral valve annuloplasty    PARTIAL GASTRECTOMY  1990   stomach tumor (large submucosal tumor felt to be be a myoma, but 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   SAVORY DILATION  02/03/2012   SAY:[id number] in the distal esophagus/Mild gastritis   TEE WITHOUT CARDIOVERSION N/A 04/01/2018   Procedure: TRANSESOPHAGEAL ECHOCARDIOGRAM (TEE);  Surgeon: Melrose Nakayama, MD;  Location: [facility];  Service: Open Heart Surgery;  Laterality: N/A;   toe graft     TONSILLECTOMY      Current Outpatient Medications  Medication Sig Dispense Refill   acetaminophen (TYLENOL) 500 MG tablet Take 500-1,000 mg by mouth every 6 (six) hours as needed for moderate pain.     aspirin EC 81 MG tablet Take 1 tablet (81 mg total) by mouth daily. 90 tablet 3   atorvastatin (LIPITOR) 40 MG tablet Take 1 tablet (40 mg total) by mouth daily at 6 PM. (Patient taking differently: Take 40 mg by mouth daily.) 30 tablet 6   Carboxymethylcellul-Glycerin (LUBRICATING EYE DROPS OP) Place 1 drop into both eyes daily as  needed (allergies/dry eyes). (Patient not taking: Reported on 03/24/2022)     cetirizine (ZYRTEC) 10 MG tablet Take 10 mg by mouth daily.     cyclobenzaprine (FLEXERIL) 10 MG tablet Take 10 mg by mouth 3 (three) times daily.  0   diphenhydrAMINE (BENADRYL) 25 MG tablet Take 25-50 mg by mouth daily as needed for allergies or sleep.     ferrous sulfate 325 (65 FE) MG EC tablet Take 325 mg by mouth See admin instructions. [id number] mg by mouth one week then stops 1 week, then restarts for 1 week     FLUoxetine (PROZAC) 40 MG capsule Take 40 mg by mouth daily.   0   gabapentin (NEURONTIN) 300 MG capsule Take 2 capsules (600 mg total) by mouth 3 (three) times daily. 180 capsule 0   omeprazole (PRILOSEC) 40 MG capsule TAKE 1 CAPSULE(40 MG) BY MOUTH TWICE DAILY BEFORE A MEAL 60 capsule 5   ondansetron (ZOFRAN) 4 MG tablet  Take 1 tablet (4 mg total) by mouth every 8 (eight) hours as needed for nausea or vomiting. 20 tablet 0   sodium chloride (OCEAN) 0.65 % SOLN nasal spray Place 1-2 sprays into both nostrils as needed for congestion.     SUBOXONE 8-2 MG FILM Place 1 tablet under the tongue in the morning, at noon, and at bedtime.  0   No current facility-administered medications for this visit.    Allergies as of 03/12/2023 - Review Complete 02/26/2023  Allergen Reaction Noted   Aspirin Other (See Comments)    Ketorolac tromethamine Other (See Comments) 09/30/2011   Nsaids Other (See Comments) 12/18/2011   Sulfonamide derivatives Nausea And Vomiting and Other (See Comments)    Imitrex [sumatriptan base] Palpitations 09/30/2011   Promethazine hcl Other (See Comments) 12/18/2011    Family History  Problem Relation Age of Onset   Colon cancer Mother        diagnosed in late 42s and died age 64   Heart failure Mother    Heart attack Father        deceased at 20   Hypertension Father    Heart failure Father    Colon cancer Maternal Aunt    Lung cancer Maternal Uncle    Throat cancer Maternal  Uncle    Colon cancer Paternal Uncle    Colon cancer Other    Heart defect Other        family history    Arthritis Other        family history   COPD Other        family history    Cancer Other        multiple unknown type   Anesthesia problems Neg Hx    Hypotension Neg Hx    Malignant hyperthermia Neg Hx    Pseudochol deficiency Neg Hx     Social History   Socioeconomic History   Marital status: Divorced    Spouse name: Not on file   Number of children: 2   Years of education: 10th grade   Highest education level: Not on file  Occupational History   Occupation: disabled: back problems   Tobacco Use   Smoking status: Every Day    Packs/day: 0.30    Years: 35.00    Additional pack years: 0.00    Total pack years: 10.50    Types: Cigarettes   Smokeless tobacco: Never  Vaping Use   Vaping Use: Never used  Substance and Sexual Activity   Alcohol use: Not Currently    Comment: rare   Drug use: Yes    Frequency: 1.0 times per week    Types: Marijuana    Comment: occ marijuana   Sexual activity: Not Currently    Birth control/protection: Surgical  Other Topics Concern   Not on file  Social History Narrative   Not on file   Social Determinants of Health   Financial Resource Strain: Not on file  Food Insecurity: Not on file  Transportation Needs: Not on file  Physical Activity: Not on file  Stress: Not on file  Social Connections: Not on file    Review of Systems: Gen: Denies fever, chills, anorexia. Denies fatigue, weakness, weight loss.  CV: Denies chest pain, palpitations, syncope, peripheral edema, and claudication. Resp: Denies dyspnea at rest, cough, wheezing, coughing up blood, and pleurisy. GI: Denies vomiting blood, jaundice, and fecal incontinence.   Denies dysphagia or odynophagia. Derm: Denies rash, itching, dry skin Psych: Denies depression, anxiety,  memory loss, confusion. No homicidal or suicidal ideation.  Heme: Denies bruising, bleeding,  and enlarged lymph nodes.  Physical Exam: There were no vitals taken for this visit. General:   Alert and oriented. No distress noted. Pleasant and cooperative.  Head:  Normocephalic and atraumatic. Eyes:  Conjuctiva clear without scleral icterus. Heart:  S1, S2 present without murmurs appreciated. Lungs:  Clear to auscultation bilaterally. No wheezes, rales, or rhonchi. No distress.  Abdomen:  +BS, soft, non-tender and non-distended. No rebound or guarding. No HSM or masses noted. Msk:  Symmetrical without gross deformities. Normal posture. Extremities:  Without edema. Neurologic:  Alert and  oriented x4 Psych:  Normal mood and affect.    Assessment:     Plan:  ***   Aliene Altes, PA-C [facility] 03/12/2023

## 2023-03-11 DIAGNOSIS — F17200 Nicotine dependence, unspecified, uncomplicated: Secondary | ICD-10-CM | POA: Diagnosis not present

## 2023-03-12 ENCOUNTER — Encounter: Payer: 59 | Admitting: Gastroenterology

## 2023-03-21 ENCOUNTER — Other Ambulatory Visit: Payer: Self-pay | Admitting: Orthopedic Surgery

## 2023-03-21 DIAGNOSIS — M25552 Pain in left hip: Secondary | ICD-10-CM

## 2023-03-21 DIAGNOSIS — M1612 Unilateral primary osteoarthritis, left hip: Secondary | ICD-10-CM

## 2023-03-27 DIAGNOSIS — F17200 Nicotine dependence, unspecified, uncomplicated: Secondary | ICD-10-CM | POA: Diagnosis not present

## 2023-03-27 DIAGNOSIS — Z720 Tobacco use: Secondary | ICD-10-CM | POA: Diagnosis not present

## 2023-04-06 DIAGNOSIS — M1990 Unspecified osteoarthritis, unspecified site: Secondary | ICD-10-CM | POA: Diagnosis not present

## 2023-04-06 DIAGNOSIS — M25552 Pain in left hip: Secondary | ICD-10-CM | POA: Diagnosis not present

## 2023-04-06 DIAGNOSIS — M1180 Other specified crystal arthropathies, unspecified site: Secondary | ICD-10-CM | POA: Diagnosis not present

## 2023-04-06 DIAGNOSIS — G43909 Migraine, unspecified, not intractable, without status migrainosus: Secondary | ICD-10-CM | POA: Diagnosis not present

## 2023-04-23 DIAGNOSIS — G43909 Migraine, unspecified, not intractable, without status migrainosus: Secondary | ICD-10-CM | POA: Diagnosis not present

## 2023-04-23 DIAGNOSIS — R42 Dizziness and giddiness: Secondary | ICD-10-CM | POA: Diagnosis not present

## 2023-04-23 DIAGNOSIS — J40 Bronchitis, not specified as acute or chronic: Secondary | ICD-10-CM | POA: Diagnosis not present

## 2023-04-23 DIAGNOSIS — M25552 Pain in left hip: Secondary | ICD-10-CM | POA: Diagnosis not present

## 2023-05-07 DIAGNOSIS — F17200 Nicotine dependence, unspecified, uncomplicated: Secondary | ICD-10-CM | POA: Diagnosis not present

## 2023-06-04 DIAGNOSIS — Z720 Tobacco use: Secondary | ICD-10-CM | POA: Diagnosis not present

## 2023-06-04 DIAGNOSIS — F17200 Nicotine dependence, unspecified, uncomplicated: Secondary | ICD-10-CM | POA: Diagnosis not present

## 2023-07-02 DIAGNOSIS — Z720 Tobacco use: Secondary | ICD-10-CM | POA: Diagnosis not present

## 2023-07-02 DIAGNOSIS — F17200 Nicotine dependence, unspecified, uncomplicated: Secondary | ICD-10-CM | POA: Diagnosis not present

## 2023-07-06 DIAGNOSIS — K219 Gastro-esophageal reflux disease without esophagitis: Secondary | ICD-10-CM | POA: Diagnosis not present

## 2023-07-06 DIAGNOSIS — M1710 Unilateral primary osteoarthritis, unspecified knee: Secondary | ICD-10-CM | POA: Diagnosis not present

## 2023-07-06 DIAGNOSIS — Z6820 Body mass index (BMI) 20.0-20.9, adult: Secondary | ICD-10-CM | POA: Diagnosis not present

## 2023-07-06 DIAGNOSIS — J449 Chronic obstructive pulmonary disease, unspecified: Secondary | ICD-10-CM | POA: Diagnosis not present

## 2023-07-06 DIAGNOSIS — E7849 Other hyperlipidemia: Secondary | ICD-10-CM | POA: Diagnosis not present

## 2023-07-06 DIAGNOSIS — Z0000 Encounter for general adult medical examination without abnormal findings: Secondary | ICD-10-CM | POA: Diagnosis not present

## 2023-07-30 ENCOUNTER — Ambulatory Visit
Admission: EM | Admit: 2023-07-30 | Discharge: 2023-07-30 | Disposition: A | Discharge status: Home / Self Care | Payer: 59 | Attending: Nurse Practitioner | Admitting: Nurse Practitioner

## 2023-07-30 ENCOUNTER — Encounter: Payer: Self-pay | Admitting: Emergency Medicine

## 2023-07-30 DIAGNOSIS — G8929 Other chronic pain: Secondary | ICD-10-CM | POA: Insufficient documentation

## 2023-07-30 DIAGNOSIS — Z8739 Personal history of other diseases of the musculoskeletal system and connective tissue: Secondary | ICD-10-CM | POA: Insufficient documentation

## 2023-07-30 DIAGNOSIS — M25552 Pain in left hip: Secondary | ICD-10-CM | POA: Diagnosis not present

## 2023-07-30 DIAGNOSIS — R42 Dizziness and giddiness: Secondary | ICD-10-CM | POA: Diagnosis not present

## 2023-07-30 DIAGNOSIS — J40 Bronchitis, not specified as acute or chronic: Secondary | ICD-10-CM | POA: Diagnosis not present

## 2023-07-30 DIAGNOSIS — R319 Hematuria, unspecified: Secondary | ICD-10-CM | POA: Diagnosis not present

## 2023-07-30 LAB — POCT URINALYSIS DIP (MANUAL ENTRY)
Bilirubin, UA: NEGATIVE
Blood, UA: NEGATIVE
Glucose, UA: NEGATIVE mg/dL
Ketones, POC UA: NEGATIVE mg/dL
Nitrite, UA: NEGATIVE
Protein Ur, POC: 100 mg/dL — AB
Spec Grav, UA: 1.02 (ref 1.010–1.025)
Urobilinogen, UA: 2 E.U./dL — AB
pH, UA: 6.5 (ref 5.0–8.0)

## 2023-07-30 MED ORDER — DEXAMETHASONE SODIUM PHOSPHATE 10 MG/ML IJ SOLN
10.0000 mg | INTRAMUSCULAR | Status: AC
  Administered 2023-07-30: 10 mg via INTRAMUSCULAR

## 2023-07-30 NOTE — Discharge Instructions (Addendum)
A urine culture is pending.  You will be contacted if the pending test results are abnormal. May take over-the-counter Tylenol as needed for pain, fever, or general discomfort. Make sure you are drinking at least 8-10 8 ounce glasses of water daily. Develop a schedule that will allow you to urinate every 2 hours. If the results of the culture are negative, and you are continuing to experience symptoms, would like for you to follow-up with your primary care physician for further evaluation.  With regard to your hip pain: You were given an injection of Decadron 10 mg for your hip pain. May take Tylenol arthritis strength 650 mg tablets as needed for pain. Apply ice or heat as needed.  Apply ice for pain or swelling, heat for spasm or stiffness.  Apply for 20 minutes, remove for 1 hour, repeat as needed. Try to remain active. It is recommended that you follow-up with orthopedics for further evaluation.  Have given you information for [facility] and for [facility].  Follow-up with your primary care physician if symptoms fail to improve.  Follow-up as needed.

## 2023-07-30 NOTE — ED Provider Notes (Signed)
[facility]    CSN: [id number] Arrival date & time: 07/30/23  1240      History   Chief Complaint No chief complaint on file.   HPI Jacqueline [name] is a 72 y.o. female.   The history is provided by the patient.   The patient presents for complaints of left hip pain and blood found in her urine.  Patient states hip pain has been chronic.  She states over the past week, she has had worsening symptoms.  Patient denies injury or trauma, numbness, tingling, or radiation of pain.  Patient reports that she has been seen by her PCP for the same or similar symptoms.  She reports she has been taking Tylenol for her symptoms along with Advil with minimal relief.  Patient states she has not followed up with orthopedics at this time.  Patient reports she has been diagnosed with osteoarthritis.  Patient presents for complaints of blood found in her urine.  Patient states she was at the Sub[facility]one clinic, and when her urine was checked, there was blood in her urine and told her she needed to be checked out.  Patient denies fever, chills, decreased urine stream, dysuria, flank pain, low back pain, urinary urgency, frequency, or vaginal symptoms.   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   History of atrial myxoma    Chronic pain syndrome 03/27/2018   Tobacco abuse 03/27/2018   HTN (hypertension) 03/27/2018   Constipation 10/06/2017   GERD (gastroesophageal reflux disease) 09/26/2016   PUD (peptic ulcer disease) 10/01/2015   Nausea with vomiting 10/01/2015   Loss of weight 10/01/2015   Dizzy 07/16/2015   Major depressive disorder, recurrent, severe without psychotic features (HCC)    Substance induced mood disorder (HCC) 04/24/2015   Opioid type dependence, continuous (HCC) 04/24/2015   Severe major depression without psychotic features (HCC) 04/24/2015   PTSD (post-traumatic stress disorder) 04/24/2015   Chest pain, localized 04/28/2012   Odynophagia 09/30/2011   Colon cancer screening 09/30/2011   Gastric tumor 09/30/2011   CLOSED FRACTURE OF UPPER END OF FIBULA 07/31/2010   Dysphagia 07/17/2009   ABDOMINAL PAIN, GENERALIZED 07/17/2009   GASTRITIS 07/12/2009   IBS 07/12/2009   RUQ  PAIN 07/12/2009   EPIGASTRIC PAIN 07/12/2009   PANCREATITIS, ACUTE, HX OF 07/12/2009   Osteoarthrosis, unspecified whether generalized or localized, hand 11/09/2008   OSTEOARTHRITIS, LOWER LEG 10/25/2008   DERANGEMENT MENISCUS 10/16/2008   KNEE PAIN 10/16/2008    Past Surgical History:  Procedure Laterality Date   ABDOMINAL HYSTERECTOMY     ABDOMINAL SURGERY     BACK SURGERY     back surgery /ray cage fusion [place], [place]     BALLOON DILATION N/A 06/04/2021   Procedure: BALLOON DILATION;  Surgeon: Lanelle Bal, DO;  Location: [facility];  Service: Endoscopy;  Laterality: N/A;   BIOPSY  07/31/2015   Procedure: BIOPSY (DUODENAL, GASTRIC, GASTRIC  ULCER, ESOPHAGEAL);  Surgeon: West Bali, MD;  Location: [facility];  Service: Endoscopy;;   CESAREAN SECTION     CHOLECYSTECTOMY     COLONOSCOPY  2001   Dr. Karilyn Cota, hemorrhoids   COLONOSCOPY  10/21/2011   QIH:[id number] polyp multiple/internal hemorrhoids   DILATION AND CURETTAGE OF UTERUS     ESOPHAGEAL DILATION N/A 07/31/2015   Procedure: ESOPHAGEAL DILATION WIRE GUIDED WITH SAVORY DILATORS , ;  Surgeon: West Bali, MD;  Location: [facility];  Service: Endoscopy;  Laterality: N/A;   ESOPHAGOGASTRODUODENOSCOPY  11/2008   A 9-mm submucosal lesion seen in the cardia., gastritis, no hpylori   ESOPHAGOGASTRODUODENOSCOPY (EGD) WITH PROPOFOL N/A 07/31/2015   GLO:VFIEPPI ulcer and moderate gastritis, dysphagia, empirical dilation with no identified source. esophageal, gastric, duodenal bx unremarkable.    ESOPHAGOGASTRODUODENOSCOPY (EGD) WITH PROPOFOL N/A 11/13/2015   SLF: 1. Gastric ulcer has healed. 2. single non-bleeding gastric AVMs 3. mild non-erosive gastritis.    ESOPHAGOGASTRODUODENOSCOPY (EGD) WITH PROPOFOL N/A 10/20/2017   Surgeon: West Bali, MD;   web in the upper third of the esophagus s/p dilation, gastritis due to NSAIDs.    ESOPHAGOGASTRODUODENOSCOPY (EGD) WITH PROPOFOL N/A 06/04/2021   Procedure: ESOPHAGOGASTRODUODENOSCOPY (EGD) WITH PROPOFOL;  Surgeon: Lanelle Bal, DO;  Location: [facility];  Service: Endoscopy;  Laterality: N/A;  7:30am   EXCISION OF ATRIAL MYXOMA N/A 04/01/2018   Procedure: RESECTION OF LEFT ATRIAL MYXOMA;  Surgeon: Loreli Slot, MD;  Location: [facility] OR;  Service: Open Heart Surgery;  Laterality: N/A;   FOOT SURGERY     GALLBLADDER SURGERY  2010   LEFT HEART CATH AND CORONARY ANGIOGRAPHY N/A 03/29/2018   Procedure: LEFT HEART CATH AND CORONARY ANGIOGRAPHY;  Surgeon: Runell Gess, MD;  Location: [facility];  Service: Cardiovascular;  Laterality: N/A;   MITRAL VALVE REPAIR N/A 04/01/2018   Procedure: MITRAL VALVE REPAIR.;  Surgeon:  Loreli Slot, MD;  Location: [facility];  Service: Open Heart Surgery;  Laterality: N/A;  Mitral valve annuloplasty    PARTIAL GASTRECTOMY  1990   stomach tumor (large submucosal tumor felt to be be a myoma, but 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   SAVORY DILATION  02/03/2012   [id number] in the distal esophagus/Mild gastritis   TEE WITHOUT CARDIOVERSION N/A 04/01/2018   Procedure: TRANSESOPHAGEAL ECHOCARDIOGRAM (TEE);  Surgeon: Loreli Slot, MD;  Location[facility];  Service: Open Heart Surgery;  Laterality: N/A;   toe graft     TONSILLECTOMY      OB History     Gravida  3   Para  2   Term  2   Preterm      AB  1   Living  2  SAB  1   IAB      Ectopic      Multiple      Live Births               Home Medications    Prior to Admission medications   Medication Sig Start Date End Date Taking? Authorizing Provider  acetaminophen (TYLENOL) 500 MG tablet Take 500-1,000 mg by mouth every 6 (six) hours as needed for moderate pain.    [provider]  atorvastatin (LIPITOR) 40 MG tablet Take 1 tablet (40 mg total) by mouth daily at 6 PM. Patient taking differently: Take 40 mg by mouth daily. 06/07/18   Laqueta Linden, MD  Carboxymethylcellul-Glycerin (LUBRICATING EYE DROPS OP) Place 1 drop into both eyes daily as needed (allergies/dry eyes). Patient not taking: Reported on 03/24/2022    [provider]  cetirizine (ZYRTEC) 10 MG tablet Take 10 mg by mouth daily.    [provider]  cyclobenzaprine (FLEXERIL) 10 MG tablet Take 10 mg by mouth 3 (three) times daily. 09/22/17   [provider]  diphenhydrAMINE (BENADRYL) 25 MG tablet Take 25-50 mg by mouth daily as needed for allergies or sleep.    [provider]  ferrous sulfate 325 (65 FE) MG EC tablet Take 325 mg by mouth See admin instructions. [id number] mg by mouth one week then stops 1  week, then restarts for 1 week    [provider]  FLUoxetine (PROZAC) 40 MG capsule Take 40 mg by mouth daily.  09/22/17   [provider]  gabapentin (NEURONTIN) 300 MG capsule Take 2 capsules (600 mg total) by mouth 3 (three) times daily. 04/30/15   Thermon Leyland, NP  omeprazole (PRILOSEC) 40 MG capsule TAKE 1 CAPSULE(40 MG) BY MOUTH TWICE DAILY BEFORE A MEAL 01/08/22   Tiffany Kocher, PA-C  ondansetron (ZOFRAN) 4 MG tablet Take 1 tablet (4 mg total) by mouth every 8 (eight) hours as needed for nausea or vomiting. 05/07/21   Letta Median, PA-C  sodium chloride (OCEAN) 0.65 % SOLN nasal spray Place 1-2 sprays into both nostrils as needed for congestion.    [provider]  SUBOXONE 8-2 MG FILM Place 1 tablet under the tongue in the morning, at noon, and at bedtime. 04/20/18   [provider]    Family History Family History  Problem Relation Age of Onset   Colon cancer Mother        diagnosed in late 38s and died age 35   Heart failure Mother    Heart attack Father        deceased at 12   Hypertension Father    Heart failure Father    Colon cancer Maternal Aunt    Lung cancer Maternal Uncle    Throat cancer Maternal Uncle    Colon cancer Paternal Uncle    Colon cancer Other    Heart defect Other        family history    Arthritis Other        family history   COPD Other        family history    Cancer Other        multiple unknown type   Anesthesia problems Neg Hx    Hypotension Neg Hx    Malignant hyperthermia Neg Hx    Pseudochol deficiency Neg Hx     Social History Social History   Tobacco Use   Smoking status: Every Day  Current packs/day: 0.30    Average packs/day: 0.3 packs/day for 35.0 years (10.5 ttl pk-yrs)    Types: Cigarettes   Smokeless tobacco: Never  Vaping Use   Vaping status: Never Used  Substance Use Topics   Alcohol use: Not Currently    Comment: rare   Drug use: Yes    Frequency: 1.0 times per week     Types: Marijuana    Comment: occ marijuana     Allergies   Aspirin, Ketorolac tromethamine, Nsaids, Sulfonamide derivatives, Imitrex [sumatriptan base], and Promethazine hcl   Review of Systems Review of Systems Per HPI  Physical Exam Triage Vital Signs ED Triage Vitals  Encounter Vitals Group     BP 07/30/23 1303 102/75     Systolic BP Percentile --      Diastolic BP Percentile --      Pulse Rate 07/30/23 1303 93     Resp 07/30/23 1303 18     Temp 07/30/23 1303 97.8 F (36.6 C)     Temp Source 07/30/23 1303 Oral     SpO2 07/30/23 1303 97 %     Weight --      Height --      Head Circumference --      Peak Flow --      Pain Score 07/30/23 1306 7     Pain Loc --      Pain Education --      Exclude from Growth Chart --    No data found.  Updated Vital Signs BP 102/75 (BP Location: Right Arm)   Pulse 93   Temp 97.8 F (36.6 C) (Oral)   Resp 18   SpO2 97%   Visual Acuity Right Eye Distance:   Left Eye Distance:   Bilateral Distance:    Right Eye Near:   Left Eye Near:    Bilateral Near:     Physical Exam Vitals and nursing note reviewed.  Constitutional:      General: She is not in acute distress.    Appearance: Normal appearance.  HENT:     Head: Normocephalic.  Eyes:     Extraocular Movements: Extraocular movements intact.     Pupils: Pupils are equal, round, and reactive to light.  Cardiovascular:     Rate and Rhythm: Normal rate and regular rhythm.     Pulses: Normal pulses.     Heart sounds: Normal heart sounds.  Pulmonary:     Effort: Pulmonary effort is normal. No respiratory distress.     Breath sounds: Normal breath sounds. No stridor. No wheezing, rhonchi or rales.  Abdominal:     General: Bowel sounds are normal.     Palpations: Abdomen is soft.     Tenderness: There is no abdominal tenderness. There is no right CVA tenderness or left CVA tenderness.  Musculoskeletal:     Cervical back: Normal range of motion.     Left hip: Tenderness  present. No deformity. Normal range of motion. Decreased strength.     Comments: Tenderness noted to the left greater trochanter.  There is no bruising, swelling, or erythema present.  Lymphadenopathy:     Cervical: No cervical adenopathy.  Skin:    General: Skin is warm and dry.  Neurological:     General: No focal deficit present.     Mental Status: She is alert and oriented to person, place, and time.  Psychiatric:        Mood and Affect: Mood normal.  Behavior: Behavior normal.      UC Treatments / Results  Labs (all labs ordered are listed, but only abnormal results are displayed) Labs Reviewed  POCT URINALYSIS DIP (MANUAL ENTRY) - Abnormal; Notable for the following components:      Result Value   Color, UA brown (*)    Protein Ur, POC =100 (*)    Urobilinogen, UA 2.0 (*)    Leukocytes, UA Large (3+) (*)    All other components within normal limits    EKG   Radiology No results found.  Procedures Procedures (including critical care time)  Medications Ordered in UC Medications - No data to display  Initial Impression / Assessment and Plan / UC Course  I have reviewed the triage vital signs and the nursing notes.  Pertinent labs & imaging results that were available during my care of the patient were reviewed by me and considered in my medical decision making (see chart for details).  The patient is well-appearing, she is in no acute distress, vital signs are stable.  Urinalysis does show large leukocytes and protein, no blood present at this time..  Patient does not present with any urinary complaints at this time however.  Urine culture ordered.  In the interim, supportive care recommendations were provided and discussed with the patient to include increasing her fluid intake, recommending she drink at least 8-10 8 ounce glasses of water daily, developing a toileting schedule, and over-the-counter analgesics for pain or discomfort.  With regard to the  left hip pain, pain is chronic in nature.  Patient has not suffered any new injury or trauma.  Given the underlying history of osteoarthritis, Decadron 10 mg IM administered.  Supportive care recommendations were provided and discussed with the patient to include over-the-counter analgesics for pain or discomfort, the use of ice or heat, and following up with orthopedics.  Patient was given information for [facility] [place] and for [facility].  Patient advised to follow-up for continuing and ongoing left hip pain/osteoarthritis.    Patient is in agreement with this plan of care and verbalizes understanding.  Patient is stable for discharge.  All questions were answered.  Final Clinical Impressions(s) / UC Diagnoses   Final diagnoses:  None   Discharge Instructions   None    ED Prescriptions   None    PDMP not reviewed this encounter.   Abran Cantor, NP 07/30/23 [phone]

## 2023-07-30 NOTE — ED Triage Notes (Signed)
Left hip pain x 1 week.  states she had her suboxone appointment and they found blood in her urine and told her she needed to be checked out.

## 2023-09-01 DIAGNOSIS — M1180 Other specified crystal arthropathies, unspecified site: Secondary | ICD-10-CM | POA: Diagnosis not present

## 2023-09-01 DIAGNOSIS — M25552 Pain in left hip: Secondary | ICD-10-CM | POA: Diagnosis not present

## 2023-09-01 DIAGNOSIS — M1990 Unspecified osteoarthritis, unspecified site: Secondary | ICD-10-CM | POA: Diagnosis not present

## 2023-09-01 DIAGNOSIS — J40 Bronchitis, not specified as acute or chronic: Secondary | ICD-10-CM | POA: Diagnosis not present

## 2023-09-01 DIAGNOSIS — R42 Dizziness and giddiness: Secondary | ICD-10-CM | POA: Diagnosis not present

## 2023-09-19 NOTE — Progress Notes (Deleted)
Referring Provider: Toma Deiters, MD Primary Care Physician:  Toma Deiters, MD Primary GI Physician: Dr. Marletta Lor  No chief complaint on file.   HPI:   Jacqueline [name] is a 72 y.o. female with a history of GERD, dysphagia s/p empiric esophageal dilations, PUD in 2016, nonbleeding gastric AVM in 2016, benign colon polyps in 2012 overdue for colonoscopy, presenting today for ***   Last seen in our office 04/24/2021 for GERD, dysphagia, anemia, and constipation.  Reported daily GERD symptoms on omeprazole 20 mg daily, associated intermittent nausea/vomiting, intermittent solid food dysphagia, and postprandial mid/upper abdominal pain/fullness.  Drinking Coke daily.  Loves spicy food.  Reported some weight loss due to limiting oral intake secondary to upper GI symptoms.  Denied overt GI bleeding.  Reported hemoglobin declined to 10-11 range 3-4 months ago.  If taking iron, she gets constipated.  Plan included increasing omeprazole to 40 mg twice daily, EGD with possible dilation, circle back to colonoscopy later this year, update CBC and iron panel, start MiraLAX daily.   Labs 05/29/2021: Hemoglobin normal at 12.4 with normocytic indices, iron panel within normal limits.   EGD 06/04/2021: Benign-appearing esophageal stenosis dilated, gastritis biopsied (reactive gastropathy), normal examined duodenum. Gastric biopsy with reactive gastropathy, no h pylori.    We have not seen patient since her EGD due to multiple no shows.    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   Irritable bowel syndrome    Knee pain    Migraines    Migraines    Peripheral neuropathy    Pneumonia    Polysubstance abuse (HCC)    opiates, cocaine, marijuana   Status post mitral valve repair    Echo 5/19: Mild LVH, EF 60-65, normal wall motion, status post mitral valve repair (mean gradient 4), mild LAE, mildly reduced RVSF    Past Surgical History:  Procedure Laterality Date   ABDOMINAL HYSTERECTOMY     ABDOMINAL SURGERY     BACK SURGERY     back surgery /ray cage fusion [place], [place]     BALLOON DILATION N/A 06/04/2021   Procedure: BALLOON DILATION;  Surgeon: Lanelle Bal, DO;  Location: [facility];  Service: Endoscopy;  Laterality: N/A;   BIOPSY  07/31/2015   Procedure: BIOPSY (DUODENAL, GASTRIC, GASTRIC ULCER, ESOPHAGEAL);  Surgeon: West Bali, MD;  Location: [facility];  Service: Endoscopy;;   CESAREAN SECTION     CHOLECYSTECTOMY     COLONOSCOPY  2001   Dr. Karilyn Cota, hemorrhoids   COLONOSCOPY  10/21/2011   XBM:[id number] polyp multiple/internal hemorrhoids   DILATION AND CURETTAGE OF UTERUS     ESOPHAGEAL DILATION N/A 07/31/2015   Procedure: ESOPHAGEAL DILATION WIRE GUIDED WITH SAVORY DILATORS , ;  Surgeon: West Bali, MD;  Location: [facility];  Service: Endoscopy;  Laterality: N/A;   ESOPHAGOGASTRODUODENOSCOPY  11/2008  A 9-mm submucosal lesion seen in the cardia., gastritis, no hpylori   ESOPHAGOGASTRODUODENOSCOPY (EGD) WITH PROPOFOL N/A 07/31/2015   WUJ:WJXBJYN ulcer and moderate gastritis, dysphagia, empirical dilation with no identified source. esophageal, gastric, duodenal bx unremarkable.    ESOPHAGOGASTRODUODENOSCOPY (EGD) WITH PROPOFOL N/A 11/13/2015   SLF: 1. Gastric ulcer has healed. 2. single non-bleeding gastric AVMs 3. mild non-erosive gastritis.    ESOPHAGOGASTRODUODENOSCOPY (EGD) WITH PROPOFOL N/A 10/20/2017   Surgeon: West Bali, MD;   web in the upper third of the esophagus s/p dilation,  gastritis due to NSAIDs.    ESOPHAGOGASTRODUODENOSCOPY (EGD) WITH PROPOFOL N/A 06/04/2021   Procedure: ESOPHAGOGASTRODUODENOSCOPY (EGD) WITH PROPOFOL;  Surgeon: Lanelle Bal, DO;  Location: [facility];  Service: Endoscopy;  Laterality: N/A;  7:30am   EXCISION OF ATRIAL MYXOMA N/A 04/01/2018   Procedure: RESECTION OF LEFT ATRIAL MYXOMA;  Surgeon: Loreli Slot, MD;  Location: [facility] OR;  Service: Open Heart Surgery;  Laterality: N/A;   FOOT SURGERY     GALLBLADDER SURGERY  2010   LEFT HEART CATH AND CORONARY ANGIOGRAPHY N/A 03/29/2018   Procedure: LEFT HEART CATH AND CORONARY ANGIOGRAPHY;  Surgeon: Runell Gess, MD;  Location: [facility];  Service: Cardiovascular;  Laterality: N/A;   MITRAL VALVE REPAIR N/A 04/01/2018   Procedure: MITRAL VALVE REPAIR.;  Surgeon: Loreli Slot, MD;  Location: [facility] OR;  Service: Open Heart Surgery;  Laterality: N/A;  Mitral valve annuloplasty    PARTIAL GASTRECTOMY  1990   stomach tumor (large submucosal tumor felt to be be a myoma, but 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   SAVORY DILATION  02/03/2012   WGN:[id number] in the distal esophagus/Mild gastritis   TEE WITHOUT CARDIOVERSION N/A 04/01/2018   Procedure: TRANSESOPHAGEAL ECHOCARDIOGRAM (TEE);  Surgeon: Loreli Slot, MD;  Location: [facility] OR;  Service: Open Heart Surgery;  Laterality: N/A;   toe graft     TONSILLECTOMY      Current Outpatient Medications  Medication Sig Dispense Refill   acetaminophen (TYLENOL) 500 MG tablet Take 500-1,000 mg by mouth every 6 (six) hours as needed for moderate pain.     atorvastatin (LIPITOR) 40 MG tablet Take 1 tablet (40 mg total) by mouth daily at 6 PM. (Patient taking differently: Take 40 mg by mouth daily.) 30 tablet 6   Carboxymethylcellul-Glycerin (LUBRICATING EYE DROPS OP) Place 1 drop into both eyes daily as needed (allergies/dry eyes). (Patient not taking: Reported  on 03/24/2022)     cetirizine (ZYRTEC) 10 MG tablet Take 10 mg by mouth daily.     cyclobenzaprine (FLEXERIL) 10 MG tablet Take 10 mg by mouth 3 (three) times daily.  0   diphenhydrAMINE (BENADRYL) 25 MG tablet Take 25-50 mg by mouth daily as needed for allergies or sleep.     ferrous sulfate 325 (65 FE) MG EC tablet Take 325 mg by mouth See admin instructions. [id number] mg by mouth one week then stops 1 week, then restarts for 1 week     FLUoxetine (PROZAC) 40 MG capsule Take 40 mg by mouth daily.   0   gabapentin (NEURONTIN) 300 MG capsule Take 2 capsules (600 mg total) by mouth 3 (three) times daily. 180 capsule 0   omeprazole (PRILOSEC) 40 MG capsule TAKE 1 CAPSULE(40 MG) BY MOUTH TWICE DAILY BEFORE A MEAL 60 capsule 5   ondansetron (ZOFRAN) 4 MG tablet Take 1 tablet (4 mg total) by mouth every  8 (eight) hours as needed for nausea or vomiting. 20 tablet 0   sodium chloride (OCEAN) 0.65 % SOLN nasal spray Place 1-2 sprays into both nostrils as needed for congestion.     SUBOXONE 8-2 MG FILM Place 1 tablet under the tongue in the morning, at noon, and at bedtime.  0   No current facility-administered medications for this visit.    Allergies as of 09/21/2023 - Review Complete 07/30/2023  Allergen Reaction Noted   Aspirin Other (See Comments)    Ketorolac tromethamine Other (See Comments) 09/30/2011   Nsaids Other (See Comments) 12/18/2011   Sulfonamide derivatives Nausea And Vomiting and Other (See Comments)    Imitrex [sumatriptan base] Palpitations 09/30/2011   Promethazine hcl Other (See Comments) 12/18/2011    Family History  Problem Relation Age of Onset   Colon cancer Mother        diagnosed in late 61s and died age 46   Heart failure Mother    Heart attack Father        deceased at 20   Hypertension Father    Heart failure Father    Colon cancer Maternal Aunt    Lung cancer Maternal Uncle    Throat cancer Maternal Uncle    Colon cancer Paternal Uncle    Colon cancer Other     Heart defect Other        family history    Arthritis Other        family history   COPD Other        family history    Cancer Other        multiple unknown type   Anesthesia problems Neg Hx    Hypotension Neg Hx    Malignant hyperthermia Neg Hx    Pseudochol deficiency Neg Hx     Social History   Socioeconomic History   Marital status: Divorced    Spouse name: Not on file   Number of children: 2   Years of education: 10th grade   Highest education level: Not on file  Occupational History   Occupation: disabled: back problems   Tobacco Use   Smoking status: Every Day    Current packs/day: 0.30    Average packs/day: 0.3 packs/day for 35.0 years (10.5 ttl pk-yrs)    Types: Cigarettes   Smokeless tobacco: Never  Vaping Use   Vaping status: Never Used  Substance and Sexual Activity   Alcohol use: Not Currently    Comment: rare   Drug use: Yes    Frequency: 1.0 times per week    Types: Marijuana    Comment: occ marijuana   Sexual activity: Not Currently    Birth control/protection: Surgical  Other Topics Concern   Not on file  Social History Narrative   Not on file   Social Determinants of Health   Financial Resource Strain: Not on file  Food Insecurity: Not on file  Transportation Needs: Not on file  Physical Activity: Not on file  Stress: Not on file  Social Connections: Not on file    Review of Systems: Gen: Denies fever, chills, anorexia. Denies fatigue, weakness, weight loss.  CV: Denies chest pain, palpitations, syncope, peripheral edema, and claudication. Resp: Denies dyspnea at rest, cough, wheezing, coughing up blood, and pleurisy. GI: Denies vomiting blood, jaundice, and fecal incontinence.   Denies dysphagia or odynophagia. Derm: Denies rash, itching, dry skin Psych: Denies depression, anxiety, memory loss, confusion. No homicidal or suicidal ideation.  Heme: Denies bruising, bleeding, and  enlarged lymph nodes.  Physical Exam: There were no  vitals taken for this visit. General:   Alert and oriented. No distress noted. Pleasant and cooperative.  Head:  Normocephalic and atraumatic. Eyes:  Conjuctiva clear without scleral icterus. Heart:  S1, S2 present without murmurs appreciated. Lungs:  Clear to auscultation bilaterally. No wheezes, rales, or rhonchi. No distress.  Abdomen:  +BS, soft, non-tender and non-distended. No rebound or guarding. No HSM or masses noted. Msk:  Symmetrical without gross deformities. Normal posture. Extremities:  Without edema. Neurologic:  Alert and  oriented x4 Psych:  Normal mood and affect.    Assessment:     Plan:  ***   Ermalinda Memos, PA-C [facility] 09/21/2023

## 2023-09-21 ENCOUNTER — Ambulatory Visit: Payer: 59 | Admitting: Gastroenterology

## 2023-09-24 DIAGNOSIS — F17200 Nicotine dependence, unspecified, uncomplicated: Secondary | ICD-10-CM | POA: Diagnosis not present

## 2023-09-24 DIAGNOSIS — Z720 Tobacco use: Secondary | ICD-10-CM | POA: Diagnosis not present

## 2023-09-24 DIAGNOSIS — R319 Hematuria, unspecified: Secondary | ICD-10-CM | POA: Diagnosis not present

## 2023-10-05 DIAGNOSIS — Z131 Encounter for screening for diabetes mellitus: Secondary | ICD-10-CM | POA: Diagnosis not present

## 2023-10-05 DIAGNOSIS — J449 Chronic obstructive pulmonary disease, unspecified: Secondary | ICD-10-CM | POA: Diagnosis not present

## 2023-10-05 DIAGNOSIS — E559 Vitamin D deficiency, unspecified: Secondary | ICD-10-CM | POA: Diagnosis not present

## 2023-10-05 DIAGNOSIS — K219 Gastro-esophageal reflux disease without esophagitis: Secondary | ICD-10-CM | POA: Diagnosis not present

## 2023-10-05 DIAGNOSIS — E7849 Other hyperlipidemia: Secondary | ICD-10-CM | POA: Diagnosis not present

## 2023-10-05 DIAGNOSIS — Z6822 Body mass index (BMI) 22.0-22.9, adult: Secondary | ICD-10-CM | POA: Diagnosis not present

## 2023-10-05 DIAGNOSIS — M1710 Unilateral primary osteoarthritis, unspecified knee: Secondary | ICD-10-CM | POA: Diagnosis not present

## 2023-10-05 DIAGNOSIS — D519 Vitamin B12 deficiency anemia, unspecified: Secondary | ICD-10-CM | POA: Diagnosis not present

## 2023-10-05 DIAGNOSIS — Z0000 Encounter for general adult medical examination without abnormal findings: Secondary | ICD-10-CM | POA: Diagnosis not present

## 2023-10-14 ENCOUNTER — Encounter: Payer: Self-pay | Admitting: *Deleted

## 2023-10-22 ENCOUNTER — Emergency Department (HOSPITAL_COMMUNITY)
Admission: EM | Admit: 2023-10-22 | Discharge: 2023-10-22 | Disposition: A | Discharge status: Home / Self Care | Payer: 59 | Attending: Emergency Medicine | Admitting: Emergency Medicine

## 2023-10-22 ENCOUNTER — Other Ambulatory Visit: Payer: Self-pay

## 2023-10-22 ENCOUNTER — Encounter (HOSPITAL_COMMUNITY): Payer: Self-pay

## 2023-10-22 DIAGNOSIS — F17200 Nicotine dependence, unspecified, uncomplicated: Secondary | ICD-10-CM | POA: Insufficient documentation

## 2023-10-22 DIAGNOSIS — J40 Bronchitis, not specified as acute or chronic: Secondary | ICD-10-CM | POA: Diagnosis not present

## 2023-10-22 DIAGNOSIS — R42 Dizziness and giddiness: Secondary | ICD-10-CM | POA: Diagnosis not present

## 2023-10-22 DIAGNOSIS — M25552 Pain in left hip: Secondary | ICD-10-CM | POA: Diagnosis not present

## 2023-10-22 DIAGNOSIS — Z743 Need for continuous supervision: Secondary | ICD-10-CM | POA: Diagnosis not present

## 2023-10-22 DIAGNOSIS — M1990 Unspecified osteoarthritis, unspecified site: Secondary | ICD-10-CM | POA: Diagnosis not present

## 2023-10-22 DIAGNOSIS — I959 Hypotension, unspecified: Secondary | ICD-10-CM | POA: Diagnosis not present

## 2023-10-22 LAB — CBC WITH DIFFERENTIAL/PLATELET
Abs Immature Granulocytes: 0.02 10*3/uL (ref 0.00–0.07)
Basophils Absolute: 0 10*3/uL (ref 0.0–0.1)
Basophils Relative: 0 %
Eosinophils Absolute: 0.2 10*3/uL (ref 0.0–0.5)
Eosinophils Relative: 2 %
HCT: 37.2 % (ref 36.0–46.0)
Hemoglobin: 11.8 g/dL — ABNORMAL LOW (ref 12.0–15.0)
Immature Granulocytes: 0 %
Lymphocytes Relative: 22 %
Lymphs Abs: 1.8 10*3/uL (ref 0.7–4.0)
MCH: 30.6 pg (ref 26.0–34.0)
MCHC: 31.7 g/dL (ref 30.0–36.0)
MCV: 96.4 fL (ref 80.0–100.0)
Monocytes Absolute: 0.5 10*3/uL (ref 0.1–1.0)
Monocytes Relative: 6 %
Neutro Abs: 5.7 10*3/uL (ref 1.7–7.7)
Neutrophils Relative %: 70 %
Platelets: 254 10*3/uL (ref 150–400)
RBC: 3.86 MIL/uL — ABNORMAL LOW (ref 3.87–5.11)
RDW: 13.4 % (ref 11.5–15.5)
WBC: 8.2 10*3/uL (ref 4.0–10.5)
nRBC: 0 % (ref 0.0–0.2)

## 2023-10-22 LAB — COMPREHENSIVE METABOLIC PANEL
ALT: 8 U/L (ref 0–44)
AST: 15 U/L (ref 15–41)
Albumin: 3.5 g/dL (ref 3.5–5.0)
Alkaline Phosphatase: 71 U/L (ref 38–126)
Anion gap: 10 (ref 5–15)
BUN: 14 mg/dL (ref 8–23)
CO2: 24 mmol/L (ref 22–32)
Calcium: 8.5 mg/dL — ABNORMAL LOW (ref 8.9–10.3)
Chloride: 101 mmol/L (ref 98–111)
Creatinine, Ser: 0.86 mg/dL (ref 0.44–1.00)
GFR, Estimated: >60 mL/min (ref >60)
Glucose, Bld: 107 mg/dL — ABNORMAL HIGH (ref 70–99)
Potassium: 3.2 mmol/L — ABNORMAL LOW (ref 3.5–5.1)
Sodium: 135 mmol/L (ref 135–145)
Total Bilirubin: 0.5 mg/dL (ref 0.3–1.2)
Total Protein: 6.4 g/dL — ABNORMAL LOW (ref 6.5–8.1)

## 2023-10-22 LAB — TROPONIN I (HIGH SENSITIVITY): Troponin I (High Sensitivity): 9 ng/L (ref <18)

## 2023-10-22 NOTE — Discharge Instructions (Addendum)
Today you were seen for dizziness.  Please drink plenty of fluids and change positions slowly.  You may take Tylenol or Motrin for your headache, do not take for more than 5 days in a row.  Thank you for letting us treat you today. After reviewing your labs and imaging, I feel you are safe to go home. Please follow up with your PCP in the next several days and provide them with your records from this visit. Return to the Emergency Room if pain becomes severe or symptoms worsen.

## 2023-10-22 NOTE — ED Provider Notes (Addendum)
[place] EMERGENCY DEPARTMENT AT [facility] Provider Note   CSN: [id number] Arrival date & time: 10/22/23  1247     History  Chief Complaint  Patient presents with   Dizziness    AOI Jacqueline [name] is a 72 y.o. female past medical history for opiate substance dependency presents today after feeling dizzy while at the pain clinic.  At the pain clinic they noticed that her blood pressure had a low reading, unsure what the reading was.  Patient states her dizziness started last night and she has had intermittent waves of nausea.  Patient also endorses headache.  Patient says she has had a reduced oral intake and has not been drinking as much water.  She states that the dizziness only happens when she changes positions from sitting to standing or lying to standing.  She denies head trauma, fever, vomiting, diarrhea, abdominal pain, or weakness.   Dizziness Associated symptoms: headaches        Home Medications Prior to Admission medications   Medication Sig Start Date End Date Taking? Authorizing Provider  acetaminophen (TYLENOL) 500 MG tablet Take 500-1,000 mg by mouth every 6 (six) hours as needed for moderate pain.    [provider]  atorvastatin (LIPITOR) 40 MG tablet Take 1 tablet (40 mg total) by mouth daily at 6 PM. Patient taking differently: Take 40 mg by mouth daily. 06/07/18   Laqueta Linden, MD  Carboxymethylcellul-Glycerin (LUBRICATING EYE DROPS OP) Place 1 drop into both eyes daily as needed (allergies/dry eyes). Patient not taking: Reported on 03/24/2022    [provider]  cetirizine (ZYRTEC) 10 MG tablet Take 10 mg by mouth daily.    [provider]  cyclobenzaprine (FLEXERIL) 10 MG tablet Take 10 mg by mouth 3 (three) times daily. 09/22/17   [provider]  diphenhydrAMINE (BENADRYL) 25 MG tablet Take 25-50 mg by mouth daily as needed for allergies or sleep.    [provider]  ferrous sulfate 325 (65 FE) MG  EC tablet Take 325 mg by mouth See admin instructions. [id number] mg by mouth one week then stops 1 week, then restarts for 1 week    [provider]  FLUoxetine (PROZAC) 40 MG capsule Take 40 mg by mouth daily.  09/22/17   [provider]  gabapentin (NEURONTIN) 300 MG capsule Take 2 capsules (600 mg total) by mouth 3 (three) times daily. 04/30/15   Thermon Leyland, NP  omeprazole (PRILOSEC) 40 MG capsule TAKE 1 CAPSULE(40 MG) BY MOUTH TWICE DAILY BEFORE A MEAL 01/08/22   Tiffany Kocher, PA-C  ondansetron (ZOFRAN) 4 MG tablet Take 1 tablet (4 mg total) by mouth every 8 (eight) hours as needed for nausea or vomiting. 05/07/21   Letta Median, PA-C  sodium chloride (OCEAN) 0.65 % SOLN nasal spray Place 1-2 sprays into both nostrils as needed for congestion.    [provider]  SUBOXONE 8-2 MG FILM Place 1 tablet under the tongue in the morning, at noon, and at bedtime. 04/20/18   [provider]      Allergies    Aspirin, Ketorolac tromethamine, Nsaids, Sulfonamide derivatives, Imitrex [sumatriptan base], and Promethazine hcl    Review of Systems   Review of Systems  Neurological:  Positive for dizziness and headaches.    Physical Exam Updated Vital Signs BP 110/86 (BP Location: Left Arm)   Pulse 81   Temp 98.8 F (37.1 C) (Oral)   Resp 16   Ht 5\' 2"  (  1.575 m)   Wt 51.7 kg   SpO2 95%   BMI 20.85 kg/m  Physical Exam Vitals and nursing note reviewed.  Constitutional:      General: She is not in acute distress.    Appearance: She is well-developed. She is not toxic-appearing.  HENT:     Head: Normocephalic and atraumatic.     Right Ear: External ear normal.     Left Ear: External ear normal.     Mouth/Throat:     Mouth: Mucous membranes are moist.  Eyes:     General: No visual field deficit.    Conjunctiva/sclera: Conjunctivae normal.     Pupils: Pupils are equal, round, and reactive to light.  Cardiovascular:     Rate and Rhythm: Normal rate  and regular rhythm.     Heart sounds: No murmur heard. Pulmonary:     Effort: Pulmonary effort is normal. No respiratory distress.     Breath sounds: Normal breath sounds.     Comments: Mildly prolonged expiration, patient states she is a heavy smoker. Abdominal:     General: Abdomen is flat.     Palpations: Abdomen is soft.     Tenderness: There is no abdominal tenderness.  Musculoskeletal:        General: No swelling.     Cervical back: Neck supple.  Skin:    General: Skin is warm and dry.     Capillary Refill: Capillary refill takes less than 2 seconds.  Neurological:     General: No focal deficit present.     Mental Status: She is alert and oriented to person, place, and time.     GCS: GCS eye subscore is 4. GCS verbal subscore is 5. GCS motor subscore is 6.     Cranial Nerves: No facial asymmetry.     Motor: No weakness.     Gait: Gait normal.  Psychiatric:        Mood and Affect: Mood normal.     ED Results / Procedures / Treatments   Labs (all labs ordered are listed, but only abnormal results are displayed) Labs Reviewed  COMPREHENSIVE METABOLIC PANEL - Abnormal; Notable for the following components:      Result Value   Potassium 3.2 (*)    Glucose, Bld 107 (*)    Calcium 8.5 (*)    Total Protein 6.4 (*)    All other components within normal limits  CBC WITH DIFFERENTIAL/PLATELET - Abnormal; Notable for the following components:   RBC 3.86 (*)    Hemoglobin 11.8 (*)    All other components within normal limits  TROPONIN I (HIGH SENSITIVITY)    EKG None  Radiology No results found.  Procedures Procedures    Medications Ordered in ED Medications - No data to display  ED Course/ Medical Decision Making/ A&P                                 Medical Decision Making Amount and/or Complexity of Data Reviewed Labs: ordered.   This patient presents to the ED with chief complaint(s) of dizziness with pertinent past medical history of substance abuse  disorder which further complicates the presenting complaint. The complaint involves an extensive differential diagnosis and also carries with it a high risk of complications and morbidity.    The differential diagnosis includes headache, mild dehydration, arrhythmia  Additional history obtained: Records reviewed Primary Care Documents  ED Course and Reassessment:  Independent labs interpretation:  The following labs were independently interpreted:  CBC: Mild anemia CMP: Mild hypokalemia 3.2 and hypocalcemia at 8.5 EKG: No notable findings Troponin: 9 Orthostatic vitals: Orthostatic Lying BP- Lying: 123/83 Pulse- Lying: 79 Orthostatic Sitting BP- Sitting: 133/89 Pulse- Sitting: 80 Orthostatic Standing at 0 minutes BP- Standing at 0 minutes: 110/86 Pulse- Standing at 0 minutes: 81   Consultation: - Consulted or discussed management/test interpretation w/ external professional: None  Consideration for admission or further workup: Considered for further admission or workup however patient has been stable throughout ER stay.  Patient states she is feeling better and safe to go home.  Patient's labs, imaging, physical exam have all been reassuring.  Patient should follow-up with PCP if symptoms persist.         Final Clinical Impression(s) / ED Diagnoses Final diagnoses:  Dizziness    Rx / DC Orders ED Discharge Orders     None         Dolphus Jenny, PA-C 10/22/23 1617    Dolphus Jenny, PA-C 10/22/23 1626    Franne Forts, DO 10/23/23 2255

## 2023-10-22 NOTE — ED Notes (Signed)
This RN obtained verbal consent to treat pt. Pt gave consent to treat and acknowledges that she assumes risks if to leave without being seen or AMA

## 2023-10-22 NOTE — ED Triage Notes (Signed)
Pt BIB [facility] from pain clinic. Pt was reported feeling dizzy. Pain clinic called 911 due to low blood pressure reading, not communicated during handoff. Dizziness started last night with waves of nausea.

## 2023-11-16 DIAGNOSIS — R319 Hematuria, unspecified: Secondary | ICD-10-CM | POA: Diagnosis not present

## 2023-11-16 DIAGNOSIS — F17200 Nicotine dependence, unspecified, uncomplicated: Secondary | ICD-10-CM | POA: Diagnosis not present

## 2024-01-04 DIAGNOSIS — E7849 Other hyperlipidemia: Secondary | ICD-10-CM | POA: Diagnosis not present

## 2024-01-04 DIAGNOSIS — Z0000 Encounter for general adult medical examination without abnormal findings: Secondary | ICD-10-CM | POA: Diagnosis not present

## 2024-01-04 DIAGNOSIS — N182 Chronic kidney disease, stage 2 (mild): Secondary | ICD-10-CM | POA: Diagnosis not present

## 2024-01-04 DIAGNOSIS — J449 Chronic obstructive pulmonary disease, unspecified: Secondary | ICD-10-CM | POA: Diagnosis not present

## 2024-01-04 DIAGNOSIS — M1710 Unilateral primary osteoarthritis, unspecified knee: Secondary | ICD-10-CM | POA: Diagnosis not present

## 2024-01-04 DIAGNOSIS — K219 Gastro-esophageal reflux disease without esophagitis: Secondary | ICD-10-CM | POA: Diagnosis not present

## 2024-01-04 DIAGNOSIS — Z6823 Body mass index (BMI) 23.0-23.9, adult: Secondary | ICD-10-CM | POA: Diagnosis not present

## 2024-01-06 DIAGNOSIS — M25552 Pain in left hip: Secondary | ICD-10-CM | POA: Diagnosis not present

## 2024-01-06 DIAGNOSIS — M1990 Unspecified osteoarthritis, unspecified site: Secondary | ICD-10-CM | POA: Diagnosis not present

## 2024-01-06 DIAGNOSIS — J40 Bronchitis, not specified as acute or chronic: Secondary | ICD-10-CM | POA: Diagnosis not present

## 2024-03-14 ENCOUNTER — Ambulatory Visit: Admitting: Orthopedic Surgery

## 2024-03-24 ENCOUNTER — Ambulatory Visit: Admitting: Orthopedic Surgery

## 2024-04-04 DIAGNOSIS — Z6820 Body mass index (BMI) 20.0-20.9, adult: Secondary | ICD-10-CM | POA: Diagnosis not present

## 2024-04-04 DIAGNOSIS — J449 Chronic obstructive pulmonary disease, unspecified: Secondary | ICD-10-CM | POA: Diagnosis not present

## 2024-04-04 DIAGNOSIS — K219 Gastro-esophageal reflux disease without esophagitis: Secondary | ICD-10-CM | POA: Diagnosis not present

## 2024-04-04 DIAGNOSIS — M1710 Unilateral primary osteoarthritis, unspecified knee: Secondary | ICD-10-CM | POA: Diagnosis not present

## 2024-04-04 DIAGNOSIS — E7849 Other hyperlipidemia: Secondary | ICD-10-CM | POA: Diagnosis not present

## 2024-04-04 DIAGNOSIS — N182 Chronic kidney disease, stage 2 (mild): Secondary | ICD-10-CM | POA: Diagnosis not present

## 2024-04-04 DIAGNOSIS — G3184 Mild cognitive impairment, so stated: Secondary | ICD-10-CM | POA: Diagnosis not present

## 2024-04-04 DIAGNOSIS — Z0000 Encounter for general adult medical examination without abnormal findings: Secondary | ICD-10-CM | POA: Diagnosis not present

## 2024-04-05 DIAGNOSIS — R319 Hematuria, unspecified: Secondary | ICD-10-CM | POA: Diagnosis not present

## 2024-04-05 DIAGNOSIS — F17200 Nicotine dependence, unspecified, uncomplicated: Secondary | ICD-10-CM | POA: Diagnosis not present

## 2024-04-14 ENCOUNTER — Encounter: Payer: Self-pay | Admitting: *Deleted

## 2024-04-28 DIAGNOSIS — G43909 Migraine, unspecified, not intractable, without status migrainosus: Secondary | ICD-10-CM | POA: Diagnosis not present

## 2024-04-28 DIAGNOSIS — M1990 Unspecified osteoarthritis, unspecified site: Secondary | ICD-10-CM | POA: Diagnosis not present

## 2024-05-25 DIAGNOSIS — M1990 Unspecified osteoarthritis, unspecified site: Secondary | ICD-10-CM | POA: Diagnosis not present

## 2024-05-25 DIAGNOSIS — R42 Dizziness and giddiness: Secondary | ICD-10-CM | POA: Diagnosis not present

## 2024-05-25 DIAGNOSIS — J40 Bronchitis, not specified as acute or chronic: Secondary | ICD-10-CM | POA: Diagnosis not present

## 2024-05-25 DIAGNOSIS — M25552 Pain in left hip: Secondary | ICD-10-CM | POA: Diagnosis not present

## 2024-06-22 DIAGNOSIS — Z720 Tobacco use: Secondary | ICD-10-CM | POA: Diagnosis not present

## 2024-06-22 DIAGNOSIS — R319 Hematuria, unspecified: Secondary | ICD-10-CM | POA: Diagnosis not present

## 2024-06-22 DIAGNOSIS — F17200 Nicotine dependence, unspecified, uncomplicated: Secondary | ICD-10-CM | POA: Diagnosis not present

## 2024-07-20 DIAGNOSIS — R319 Hematuria, unspecified: Secondary | ICD-10-CM | POA: Diagnosis not present

## 2024-07-20 DIAGNOSIS — F17200 Nicotine dependence, unspecified, uncomplicated: Secondary | ICD-10-CM | POA: Diagnosis not present

## 2024-07-20 DIAGNOSIS — Z720 Tobacco use: Secondary | ICD-10-CM | POA: Diagnosis not present

## 2024-10-04 DIAGNOSIS — M1990 Unspecified osteoarthritis, unspecified site: Secondary | ICD-10-CM | POA: Diagnosis not present

## 2024-10-04 DIAGNOSIS — G43909 Migraine, unspecified, not intractable, without status migrainosus: Secondary | ICD-10-CM | POA: Diagnosis not present

## 2024-10-25 ENCOUNTER — Ambulatory Visit
Admission: EM | Admit: 2024-10-25 | Discharge: 2024-10-25 | Disposition: A | Discharge status: Home / Self Care | Attending: Nurse Practitioner | Admitting: Nurse Practitioner

## 2024-10-25 DIAGNOSIS — J069 Acute upper respiratory infection, unspecified: Secondary | ICD-10-CM

## 2024-10-25 DIAGNOSIS — J441 Chronic obstructive pulmonary disease with (acute) exacerbation: Secondary | ICD-10-CM

## 2024-10-25 LAB — POC SOFIA SARS ANTIGEN FIA: SARS Coronavirus 2 Ag: NEGATIVE

## 2024-10-25 MED ORDER — AZITHROMYCIN 250 MG PO TABS: 250.0000 mg | ORAL_TABLET | Freq: Every day | ORAL | 0 refills | Status: AC

## 2024-10-25 MED ORDER — PREDNISONE 20 MG PO TABS: 40.0000 mg | ORAL_TABLET | Freq: Every day | ORAL | 0 refills | Status: AC

## 2024-10-25 MED ORDER — BENZONATATE 100 MG PO CAPS: 100.0000 mg | ORAL_CAPSULE | Freq: Three times a day (TID) | ORAL | 0 refills | Status: AC | PRN

## 2024-10-25 MED ORDER — ALBUTEROL SULFATE HFA 108 (90 BASE) MCG/ACT IN AERS: 2.0000 | INHALATION_SPRAY | Freq: Four times a day (QID) | RESPIRATORY_TRACT | 0 refills | Status: AC | PRN

## 2024-10-25 NOTE — ED Triage Notes (Signed)
 Pt reports she has cough , weakness, chest discomfort, no appetite, and nausea x 2 days   States her toe nails are blue

## 2024-10-25 NOTE — Discharge Instructions (Signed)
 The COVID test was negative. Take medication as prescribed. Increase fluids and allow for plenty of rest. You may take over-the-counter Tylenol  as needed for pain, fever, or general discomfort. Recommend use of a humidifier in your bedroom at nighttime during sleep and sleeping elevated on pillows while cough symptoms persist. Please consider smoking sensation as this will help improve your symptoms. Follow-up with your primary care physician within the next 7 to 10 days for reevaluation. Follow-up as needed.

## 2024-10-25 NOTE — ED Provider Notes (Signed)
 [facility]    CSN: [id number] Arrival date & time: 10/25/24  1004      History   Chief Complaint No chief complaint on file.   HPI Jacqueline [name] is a 73 y.o. female.   The history is provided by the patient.   Patient presents with a 2-day history of cough, weakness, chest discomfort, decreased appetite, and nausea.  Patient denies fever, chills, wheezing, difficulty breathing, shortness of breath, abdominal pain, diarrhea, constipation, or rash.  Patient reports that her roommate has been sick for 2 weeks.  Patient reports that she is a current smoker, states that she and her roommates are trying to quit.  Patient with underlying history of COPD per her chart, states that she currently is not on any medications for COPD.  Patient states she has not been taking any medication for her symptoms.    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   Irritable bowel syndrome    Knee pain    Migraines    Migraines    Peripheral neuropathy    Pneumonia    Polysubstance abuse (HCC)    opiates, cocaine, marijuana   Status post mitral valve repair    Echo 5/19: Mild LVH, EF 60-65, normal wall motion, status post mitral valve repair (mean gradient 4), mild LAE, mildly reduced RVSF    Patient Active Problem List   Diagnosis Date Noted   Anemia 04/24/2021   Heme + stool 08/18/2018   Atrial myxoma    CAD (coronary artery disease) 04/21/2018   S/P mitral valve repair 04/02/2018   COPD with chronic bronchitis (HCC)  03/29/2018   History of atrial myxoma    Chronic pain syndrome 03/27/2018   Tobacco abuse 03/27/2018   HTN (hypertension) 03/27/2018   Constipation 10/06/2017   GERD (gastroesophageal reflux disease) 09/26/2016   PUD (peptic ulcer disease) 10/01/2015   Nausea with vomiting 10/01/2015   Loss of weight 10/01/2015   Dizzy 07/16/2015   Major depressive disorder, recurrent, severe without psychotic features (HCC)    Substance induced mood disorder (HCC) 04/24/2015   Opioid type dependence, continuous (HCC) 04/24/2015   Severe major depression without psychotic features (HCC) 04/24/2015   PTSD (post-traumatic stress disorder) 04/24/2015   Chest pain, localized 04/28/2012   Odynophagia 09/30/2011   Colon cancer screening 09/30/2011   Gastric tumor 09/30/2011   CLOSED FRACTURE OF UPPER END OF FIBULA 07/31/2010   Dysphagia 07/17/2009   ABDOMINAL PAIN, GENERALIZED 07/17/2009   GASTRITIS 07/12/2009   IBS 07/12/2009   RUQ PAIN 07/12/2009   EPIGASTRIC PAIN 07/12/2009   PANCREATITIS, ACUTE, HX OF 07/12/2009   Osteoarthrosis, unspecified whether generalized or localized, hand 11/09/2008   OSTEOARTHRITIS, LOWER LEG 10/25/2008   DERANGEMENT MENISCUS 10/16/2008   KNEE PAIN 10/16/2008    Past Surgical History:  Procedure Laterality Date   ABDOMINAL HYSTERECTOMY     ABDOMINAL SURGERY     BACK  SURGERY     back surgery /ray cage fusion comberg, gso     BALLOON DILATION N/A 06/04/2021   Procedure: BALLOON DILATION;  Surgeon: Cindie Carlin POUR, DO;  Location: [facility];  Service: Endoscopy;  Laterality: N/A;   BIOPSY  07/31/2015   Procedure: BIOPSY (DUODENAL, GASTRIC, GASTRIC ULCER, ESOPHAGEAL);  Surgeon: Margo LITTIE Haddock, MD;  Location: [facility];  Service: Endoscopy;;   CESAREAN SECTION     CHOLECYSTECTOMY     COLONOSCOPY  2001   Dr. Golda, hemorrhoids   COLONOSCOPY  10/21/2011   DOQ:Dzddpoz polyp multiple/internal hemorrhoids   DILATION AND CURETTAGE OF UTERUS     ESOPHAGEAL DILATION N/A  07/31/2015   Procedure: ESOPHAGEAL DILATION WIRE GUIDED WITH SAVORY DILATORS , ;  Surgeon: Margo LITTIE Haddock, MD;  Location: [facility];  Service: Endoscopy;  Laterality: N/A;   ESOPHAGOGASTRODUODENOSCOPY  11/2008   A 9-mm submucosal lesion seen in the cardia., gastritis, no hpylori   ESOPHAGOGASTRODUODENOSCOPY (EGD) WITH PROPOFOL  N/A 07/31/2015   DOQ:hjdumpr ulcer and moderate gastritis, dysphagia, empirical dilation with no identified source. esophageal, gastric, duodenal bx unremarkable.    ESOPHAGOGASTRODUODENOSCOPY (EGD) WITH PROPOFOL  N/A 11/13/2015   SLF: 1. Gastric ulcer has healed. 2. single non-bleeding gastric AVMs 3. mild non-erosive gastritis.    ESOPHAGOGASTRODUODENOSCOPY (EGD) WITH PROPOFOL  N/A 10/20/2017   Surgeon: Haddock Margo LITTIE, MD;   web in the upper third of the esophagus s/p dilation, gastritis due to NSAIDs.    ESOPHAGOGASTRODUODENOSCOPY (EGD) WITH PROPOFOL  N/A 06/04/2021   Procedure: ESOPHAGOGASTRODUODENOSCOPY (EGD) WITH PROPOFOL ;  Surgeon: Cindie Carlin POUR, DO;  Location: [facility];  Service: Endoscopy;  Laterality: N/A;  7:30am   EXCISION OF ATRIAL MYXOMA N/A 04/01/2018   Procedure: RESECTION OF LEFT ATRIAL MYXOMA;  Surgeon: Kerrin Elspeth BROCKS, MD;  Location: [facility] OR;  Service: Open Heart Surgery;  Laterality: N/A;   FOOT SURGERY     GALLBLADDER SURGERY  2010   LEFT HEART CATH AND CORONARY ANGIOGRAPHY N/A 03/29/2018   Procedure: LEFT HEART CATH AND CORONARY ANGIOGRAPHY;  Surgeon: Court Dorn PARAS, MD;  Location: [facility];  Service: Cardiovascular;  Laterality: N/A;   PARTIAL GASTRECTOMY  1990   stomach tumor (large submucosal tumor felt to be be a myoma, but 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   SAVORY DILATION  02/03/2012   DOQ:Dumprulmz in the distal esophagus/Mild gastritis   TEE WITHOUT CARDIOVERSION N/A 04/01/2018   Procedure: TRANSESOPHAGEAL ECHOCARDIOGRAM (TEE);  Surgeon: Kerrin Elspeth BROCKS, MD;  Location: [facility] OR;  Service: Open Heart Surgery;  Laterality: N/A;   toe graft     TONSILLECTOMY     TRANSCATHETER MITRAL EDGE TO EDGE REPAIR N/A 04/01/2018   Procedure: MITRAL VALVE REPAIR.;  Surgeon: Kerrin Elspeth BROCKS, MD;  Location: [facility] OR;  Service: Open Heart Surgery;  Laterality: N/A;  Mitral valve annuloplasty     OB History     Gravida  3   Para  2   Term  2   Preterm      AB  1   Living  2      SAB  1   IAB      Ectopic      Multiple      Live Births               Home Medications    Prior to Admission medications   Medication Sig Start Date End Date Taking? Authorizing  Provider  acetaminophen  (TYLENOL ) 500 MG tablet Take 500-1,000 mg by mouth every 6 (six) hours as needed for moderate pain.    [provider]  atorvastatin  (LIPITOR) 40 MG tablet Take 1 tablet (40 mg total) by mouth daily at 6 PM. Patient taking differently: Take 40 mg by mouth daily. 06/07/18   Koneswaran, Suresh A, MD  Carboxymethylcellul-Glycerin (LUBRICATING EYE DROPS OP) Place 1 drop into both eyes daily as needed (allergies/dry eyes). Patient not taking: Reported on 03/24/2022    [provider]  cetirizine (ZYRTEC) 10 MG tablet Take 10 mg by mouth daily.    [provider]  cyclobenzaprine  (FLEXERIL ) 10 MG tablet Take 10 mg by mouth 3 (three) times daily. 09/22/17   [provider]  diphenhydrAMINE  (BENADRYL ) 25 MG tablet Take 25-50 mg by mouth daily as needed for allergies or sleep.    [provider]  ferrous sulfate 325 (65 FE) MG EC tablet Take 325 mg by mouth See admin instructions. Take325 mg by mouth one week then stops 1 week, then restarts for 1 week    [provider]  FLUoxetine  (PROZAC ) 40 MG capsule Take 40 mg by mouth daily.  09/22/17   [provider]  gabapentin  (NEURONTIN ) 300 MG capsule Take 2 capsules (600 mg total) by mouth 3 (three) times daily. 04/30/15   Nicholaus Leita LABOR, NP  omeprazole   (PRILOSEC) 40 MG capsule TAKE 1 CAPSULE(40 MG) BY MOUTH TWICE DAILY BEFORE A MEAL 01/08/22   Ezzard Sonny RAMAN, PA-C  ondansetron  (ZOFRAN ) 4 MG tablet Take 1 tablet (4 mg total) by mouth every 8 (eight) hours as needed for nausea or vomiting. 05/07/21   Rudy Josette RAMAN, PA-C  sodium chloride  (OCEAN) 0.65 % SOLN nasal spray Place 1-2 sprays into both nostrils as needed for congestion.    [provider]  SUBOXONE  8-2 MG FILM Place 1 tablet under the tongue in the morning, at noon, and at bedtime. 04/20/18   [provider]    Family History Family History  Problem Relation Age of Onset   Colon cancer Mother        diagnosed in late 55s and died age 67   Heart failure Mother    Heart attack Father        deceased at 48   Hypertension Father    Heart failure Father    Colon cancer Maternal Aunt    Lung cancer Maternal Uncle    Throat cancer Maternal Uncle    Colon cancer Paternal Uncle    Colon cancer Other    Heart defect Other        family history    Arthritis Other        family history   COPD Other        family history    Cancer Other        multiple unknown type   Anesthesia problems Neg Hx    Hypotension Neg Hx    Malignant hyperthermia Neg Hx    Pseudochol deficiency Neg Hx     Social History Social History   Tobacco Use   Smoking status: Every Day    Current packs/day: 0.30    Average packs/day: 0.3 packs/day for 35.0 years (10.5 ttl pk-yrs)    Types: Cigarettes   Smokeless tobacco: Never  Vaping Use   Vaping status: Never Used  Substance Use Topics   Alcohol use: Not Currently    Comment: rare   Drug use: Not Currently  Frequency: 1.0 times per week    Types: Marijuana    Comment: occ marijuana     Allergies   Aspirin , Ketorolac tromethamine, Nsaids, Sulfonamide derivatives, Imitrex [sumatriptan base], and Promethazine  hcl   Review of Systems Review of Systems Per HPI  Physical Exam Triage Vital Signs ED Triage Vitals   Encounter Vitals Group     BP 10/25/24 1015 (!) 145/82     Girls Systolic BP Percentile --      Girls Diastolic BP Percentile --      Boys Systolic BP Percentile --      Boys Diastolic BP Percentile --      Pulse Rate 10/25/24 1015 92     Resp 10/25/24 1015 20     Temp 10/25/24 1015 98.2 F (36.8 C)     Temp Source 10/25/24 1015 Oral     SpO2 10/25/24 1015 94 %     Weight --      Height --      Head Circumference --      Peak Flow --      Pain Score 10/25/24 1017 7     Pain Loc --      Pain Education --      Exclude from Growth Chart --    No data found.  Updated Vital Signs BP (!) 145/82 (BP Location: Right Arm)   Pulse 92   Temp 98.2 F (36.8 C) (Oral)   Resp 20   SpO2 94%   Visual Acuity Right Eye Distance:   Left Eye Distance:   Bilateral Distance:    Right Eye Near:   Left Eye Near:    Bilateral Near:     Physical Exam Vitals and nursing note reviewed.  Constitutional:      General: She is not in acute distress.    Appearance: Normal appearance.  HENT:     Head: Normocephalic.     Right Ear: Tympanic membrane, ear canal and external ear normal.     Left Ear: Tympanic membrane, ear canal and external ear normal.     Nose: Congestion present.     Mouth/Throat:     Lips: Pink.     Mouth: Mucous membranes are moist.     Pharynx: Uvula midline. Postnasal drip present. No pharyngeal swelling, oropharyngeal exudate, posterior oropharyngeal erythema or uvula swelling.     Comments: Cobblestoning present to posterior oropharynx  Eyes:     Extraocular Movements: Extraocular movements intact.     Conjunctiva/sclera: Conjunctivae normal.     Pupils: Pupils are equal, round, and reactive to light.  Cardiovascular:     Rate and Rhythm: Normal rate and regular rhythm.     Pulses: Normal pulses.     Heart sounds: Normal heart sounds.  Pulmonary:     Effort: Pulmonary effort is normal. No respiratory distress.     Breath sounds: Normal breath sounds. No stridor.  No wheezing, rhonchi or rales.  Abdominal:     General: Bowel sounds are normal.     Palpations: Abdomen is soft.  Musculoskeletal:     Cervical back: Normal range of motion.  Skin:    General: Skin is warm and dry.  Neurological:     General: No focal deficit present.     Mental Status: She is alert and oriented to person, place, and time.  Psychiatric:        Mood and Affect: Mood normal.        Behavior: Behavior normal.  UC Treatments / Results  Labs (all labs ordered are listed, but only abnormal results are displayed) Labs Reviewed  POC SOFIA SARS ANTIGEN FIA    EKG   Radiology No results found.  Procedures Procedures (including critical care time)  Medications Ordered in UC Medications - No data to display  Initial Impression / Assessment and Plan / UC Course  I have reviewed the triage vital signs and the nursing notes.  Pertinent labs & imaging results that were available during my care of the patient were reviewed by me and considered in my medical decision making (see chart for details).  COVID test is negative.  On exam, the patient's lung sounds are clear throughout, room air sats are at 94%.  Symptoms consistent with a viral URI with cough.  Given the patient's underlying history of COPD, concern for exacerbation given her complaints of chest discomfort.  Will treat empirically with azithromycin  250 mg, prednisone  40 mg, and albuterol  inhaler for wheezing, and Tessalon  100 mg.  Supportive care recommendations were provided and discussed with the patient to include fluids, rest, over-the-counter Tylenol , and use of a humidifier during sleep.  Patient was strongly encouraged to consider smoking cessation.  Discussed indications with patient regarding follow-up with her PCP.  Patient was in agreement with this plan of care and verbalizes understanding.  All questions were answered.  Patient stable for discharge.  Final Clinical Impressions(s) / UC Diagnoses    Final diagnoses:  None   Discharge Instructions   None    ED Prescriptions   None    PDMP not reviewed this encounter.   Gilmer Etta PARAS, NP 10/25/24 1045

## 2024-12-30 ENCOUNTER — Ambulatory Visit: Admitting: Orthopedic Surgery
